# Patient Record
Sex: Male | Born: 1985 | Race: White | Hispanic: No | Smoking: Former smoker
Health system: Southern US, Community
[De-identification: ages and names within clinical notes are randomized; demographics above are authoritative.]

## PROBLEM LIST (undated history)

## (undated) DIAGNOSIS — I10 Essential (primary) hypertension: Secondary | ICD-10-CM

## (undated) DIAGNOSIS — E611 Iron deficiency: Secondary | ICD-10-CM

## (undated) DIAGNOSIS — S069XAA Unspecified intracranial injury with loss of consciousness status unknown, initial encounter: Secondary | ICD-10-CM

## (undated) DIAGNOSIS — I82409 Acute embolism and thrombosis of unspecified deep veins of unspecified lower extremity: Secondary | ICD-10-CM

## (undated) DIAGNOSIS — A0472 Enterocolitis due to Clostridium difficile, not specified as recurrent: Secondary | ICD-10-CM

---

## 2022-03-10 ENCOUNTER — Emergency Department (HOSPITAL_COMMUNITY): Payer: Medicaid Other

## 2022-03-10 ENCOUNTER — Encounter (HOSPITAL_COMMUNITY): Payer: Self-pay

## 2022-03-10 ENCOUNTER — Inpatient Hospital Stay (HOSPITAL_COMMUNITY)
Admission: EM | Admit: 2022-03-10 | Discharge: 2022-05-08 | DRG: 003 | Disposition: A | Discharge status: Rehabilitation Facility | Payer: Medicaid Other | Attending: General Surgery | Admitting: General Surgery

## 2022-03-10 ENCOUNTER — Inpatient Hospital Stay (HOSPITAL_COMMUNITY): Payer: Medicaid Other

## 2022-03-10 DIAGNOSIS — I82451 Acute embolism and thrombosis of right peroneal vein: Secondary | ICD-10-CM | POA: Diagnosis not present

## 2022-03-10 DIAGNOSIS — R4189 Other symptoms and signs involving cognitive functions and awareness: Secondary | ICD-10-CM | POA: Diagnosis not present

## 2022-03-10 DIAGNOSIS — Z7189 Other specified counseling: Secondary | ICD-10-CM | POA: Diagnosis not present

## 2022-03-10 DIAGNOSIS — J690 Pneumonitis due to inhalation of food and vomit: Secondary | ICD-10-CM | POA: Diagnosis present

## 2022-03-10 DIAGNOSIS — S02113A Unspecified occipital condyle fracture, initial encounter for closed fracture: Secondary | ICD-10-CM | POA: Diagnosis present

## 2022-03-10 DIAGNOSIS — S066XAD Traumatic subarachnoid hemorrhage with loss of consciousness status unknown, subsequent encounter: Secondary | ICD-10-CM | POA: Diagnosis not present

## 2022-03-10 DIAGNOSIS — Y838 Other surgical procedures as the cause of abnormal reaction of the patient, or of later complication, without mention of misadventure at the time of the procedure: Secondary | ICD-10-CM | POA: Diagnosis not present

## 2022-03-10 DIAGNOSIS — R339 Retention of urine, unspecified: Secondary | ICD-10-CM | POA: Diagnosis present

## 2022-03-10 DIAGNOSIS — Z20822 Contact with and (suspected) exposure to covid-19: Secondary | ICD-10-CM | POA: Diagnosis present

## 2022-03-10 DIAGNOSIS — S069X4A Unspecified intracranial injury with loss of consciousness of 6 hours to 24 hours, initial encounter: Secondary | ICD-10-CM | POA: Diagnosis not present

## 2022-03-10 DIAGNOSIS — R159 Full incontinence of feces: Secondary | ICD-10-CM | POA: Diagnosis not present

## 2022-03-10 DIAGNOSIS — F17210 Nicotine dependence, cigarettes, uncomplicated: Secondary | ICD-10-CM | POA: Diagnosis present

## 2022-03-10 DIAGNOSIS — S01512A Laceration without foreign body of oral cavity, initial encounter: Secondary | ICD-10-CM | POA: Diagnosis present

## 2022-03-10 DIAGNOSIS — R4182 Altered mental status, unspecified: Secondary | ICD-10-CM | POA: Diagnosis not present

## 2022-03-10 DIAGNOSIS — L8990 Pressure ulcer of unspecified site, unspecified stage: Secondary | ICD-10-CM | POA: Insufficient documentation

## 2022-03-10 DIAGNOSIS — S00572A Other superficial bite of oral cavity, initial encounter: Secondary | ICD-10-CM | POA: Diagnosis present

## 2022-03-10 DIAGNOSIS — Z91010 Allergy to peanuts: Secondary | ICD-10-CM

## 2022-03-10 DIAGNOSIS — Z23 Encounter for immunization: Secondary | ICD-10-CM | POA: Diagnosis not present

## 2022-03-10 DIAGNOSIS — S01311A Laceration without foreign body of right ear, initial encounter: Secondary | ICD-10-CM | POA: Diagnosis present

## 2022-03-10 DIAGNOSIS — Z87820 Personal history of traumatic brain injury: Secondary | ICD-10-CM | POA: Diagnosis not present

## 2022-03-10 DIAGNOSIS — Y92410 Unspecified street and highway as the place of occurrence of the external cause: Secondary | ICD-10-CM | POA: Diagnosis not present

## 2022-03-10 DIAGNOSIS — K9421 Gastrostomy hemorrhage: Secondary | ICD-10-CM | POA: Diagnosis not present

## 2022-03-10 DIAGNOSIS — S12001A Unspecified nondisplaced fracture of first cervical vertebra, initial encounter for closed fracture: Secondary | ICD-10-CM | POA: Diagnosis present

## 2022-03-10 DIAGNOSIS — I10 Essential (primary) hypertension: Secondary | ICD-10-CM | POA: Diagnosis present

## 2022-03-10 DIAGNOSIS — R61 Generalized hyperhidrosis: Secondary | ICD-10-CM | POA: Diagnosis not present

## 2022-03-10 DIAGNOSIS — R411 Anterograde amnesia: Secondary | ICD-10-CM | POA: Diagnosis not present

## 2022-03-10 DIAGNOSIS — A0472 Enterocolitis due to Clostridium difficile, not specified as recurrent: Secondary | ICD-10-CM | POA: Diagnosis not present

## 2022-03-10 DIAGNOSIS — Z9911 Dependence on respirator [ventilator] status: Secondary | ICD-10-CM

## 2022-03-10 DIAGNOSIS — E7220 Disorder of urea cycle metabolism, unspecified: Secondary | ICD-10-CM | POA: Diagnosis not present

## 2022-03-10 DIAGNOSIS — R52 Pain, unspecified: Secondary | ICD-10-CM | POA: Diagnosis not present

## 2022-03-10 DIAGNOSIS — R4701 Aphasia: Secondary | ICD-10-CM | POA: Diagnosis not present

## 2022-03-10 DIAGNOSIS — R451 Restlessness and agitation: Secondary | ICD-10-CM | POA: Diagnosis not present

## 2022-03-10 DIAGNOSIS — J9509 Other tracheostomy complication: Secondary | ICD-10-CM | POA: Diagnosis not present

## 2022-03-10 DIAGNOSIS — S069XAA Unspecified intracranial injury with loss of consciousness status unknown, initial encounter: Secondary | ICD-10-CM | POA: Diagnosis not present

## 2022-03-10 DIAGNOSIS — L89899 Pressure ulcer of other site, unspecified stage: Secondary | ICD-10-CM | POA: Diagnosis not present

## 2022-03-10 DIAGNOSIS — R21 Rash and other nonspecific skin eruption: Secondary | ICD-10-CM | POA: Diagnosis not present

## 2022-03-10 DIAGNOSIS — E162 Hypoglycemia, unspecified: Secondary | ICD-10-CM | POA: Diagnosis not present

## 2022-03-10 DIAGNOSIS — R41840 Attention and concentration deficit: Secondary | ICD-10-CM | POA: Diagnosis not present

## 2022-03-10 DIAGNOSIS — J9601 Acute respiratory failure with hypoxia: Secondary | ICD-10-CM | POA: Diagnosis present

## 2022-03-10 DIAGNOSIS — S069X4D Unspecified intracranial injury with loss of consciousness of 6 hours to 24 hours, subsequent encounter: Secondary | ICD-10-CM | POA: Diagnosis not present

## 2022-03-10 DIAGNOSIS — S069X0S Unspecified intracranial injury without loss of consciousness, sequela: Secondary | ICD-10-CM | POA: Diagnosis not present

## 2022-03-10 DIAGNOSIS — S069X4S Unspecified intracranial injury with loss of consciousness of 6 hours to 24 hours, sequela: Secondary | ICD-10-CM | POA: Diagnosis not present

## 2022-03-10 DIAGNOSIS — S066X9A Traumatic subarachnoid hemorrhage with loss of consciousness of unspecified duration, initial encounter: Principal | ICD-10-CM | POA: Diagnosis present

## 2022-03-10 DIAGNOSIS — Z7901 Long term (current) use of anticoagulants: Secondary | ICD-10-CM | POA: Diagnosis not present

## 2022-03-10 DIAGNOSIS — Z880 Allergy status to penicillin: Secondary | ICD-10-CM | POA: Diagnosis not present

## 2022-03-10 DIAGNOSIS — R739 Hyperglycemia, unspecified: Secondary | ICD-10-CM | POA: Diagnosis not present

## 2022-03-10 DIAGNOSIS — R509 Fever, unspecified: Secondary | ICD-10-CM | POA: Diagnosis not present

## 2022-03-10 DIAGNOSIS — R40243 Glasgow coma scale score 3-8, unspecified time: Secondary | ICD-10-CM | POA: Diagnosis present

## 2022-03-10 DIAGNOSIS — S12000S Unspecified displaced fracture of first cervical vertebra, sequela: Secondary | ICD-10-CM | POA: Diagnosis not present

## 2022-03-10 DIAGNOSIS — K9423 Gastrostomy malfunction: Secondary | ICD-10-CM | POA: Diagnosis not present

## 2022-03-10 DIAGNOSIS — Z86718 Personal history of other venous thrombosis and embolism: Secondary | ICD-10-CM | POA: Diagnosis not present

## 2022-03-10 DIAGNOSIS — E440 Moderate protein-calorie malnutrition: Secondary | ICD-10-CM | POA: Diagnosis not present

## 2022-03-10 DIAGNOSIS — S069XAD Unspecified intracranial injury with loss of consciousness status unknown, subsequent encounter: Secondary | ICD-10-CM | POA: Diagnosis not present

## 2022-03-10 DIAGNOSIS — S30820A Blister (nonthermal) of lower back and pelvis, initial encounter: Secondary | ICD-10-CM | POA: Diagnosis not present

## 2022-03-10 DIAGNOSIS — I69319 Unspecified symptoms and signs involving cognitive functions following cerebral infarction: Secondary | ICD-10-CM | POA: Diagnosis not present

## 2022-03-10 DIAGNOSIS — M7989 Other specified soft tissue disorders: Secondary | ICD-10-CM | POA: Diagnosis not present

## 2022-03-10 DIAGNOSIS — I82441 Acute embolism and thrombosis of right tibial vein: Secondary | ICD-10-CM | POA: Diagnosis not present

## 2022-03-10 DIAGNOSIS — S70321A Blister (nonthermal), right thigh, initial encounter: Secondary | ICD-10-CM | POA: Diagnosis not present

## 2022-03-10 DIAGNOSIS — T17490A Other foreign object in trachea causing asphyxiation, initial encounter: Secondary | ICD-10-CM | POA: Diagnosis not present

## 2022-03-10 DIAGNOSIS — R4020 Unspecified coma: Secondary | ICD-10-CM | POA: Diagnosis present

## 2022-03-10 DIAGNOSIS — K5900 Constipation, unspecified: Secondary | ICD-10-CM | POA: Diagnosis not present

## 2022-03-10 DIAGNOSIS — G479 Sleep disorder, unspecified: Secondary | ICD-10-CM | POA: Diagnosis not present

## 2022-03-10 DIAGNOSIS — R4689 Other symptoms and signs involving appearance and behavior: Secondary | ICD-10-CM | POA: Diagnosis not present

## 2022-03-10 DIAGNOSIS — S70322A Blister (nonthermal), left thigh, initial encounter: Secondary | ICD-10-CM | POA: Diagnosis not present

## 2022-03-10 DIAGNOSIS — S069X9A Unspecified intracranial injury with loss of consciousness of unspecified duration, initial encounter: Secondary | ICD-10-CM | POA: Diagnosis present

## 2022-03-10 DIAGNOSIS — I63419 Cerebral infarction due to embolism of unspecified middle cerebral artery: Secondary | ICD-10-CM | POA: Diagnosis not present

## 2022-03-10 DIAGNOSIS — R5383 Other fatigue: Secondary | ICD-10-CM | POA: Diagnosis not present

## 2022-03-10 DIAGNOSIS — R1310 Dysphagia, unspecified: Secondary | ICD-10-CM | POA: Diagnosis not present

## 2022-03-10 DIAGNOSIS — Z931 Gastrostomy status: Secondary | ICD-10-CM | POA: Diagnosis not present

## 2022-03-10 DIAGNOSIS — R32 Unspecified urinary incontinence: Secondary | ICD-10-CM | POA: Diagnosis not present

## 2022-03-10 DIAGNOSIS — R1312 Dysphagia, oropharyngeal phase: Secondary | ICD-10-CM | POA: Diagnosis not present

## 2022-03-10 DIAGNOSIS — F068 Other specified mental disorders due to known physiological condition: Secondary | ICD-10-CM | POA: Diagnosis not present

## 2022-03-10 DIAGNOSIS — I824Z1 Acute embolism and thrombosis of unspecified deep veins of right distal lower extremity: Secondary | ICD-10-CM | POA: Diagnosis not present

## 2022-03-10 DIAGNOSIS — S01311D Laceration without foreign body of right ear, subsequent encounter: Secondary | ICD-10-CM | POA: Diagnosis not present

## 2022-03-10 DIAGNOSIS — R000 Tachycardia, unspecified: Secondary | ICD-10-CM | POA: Diagnosis not present

## 2022-03-10 DIAGNOSIS — R197 Diarrhea, unspecified: Secondary | ICD-10-CM | POA: Diagnosis not present

## 2022-03-10 DIAGNOSIS — J386 Stenosis of larynx: Secondary | ICD-10-CM | POA: Diagnosis not present

## 2022-03-10 DIAGNOSIS — D500 Iron deficiency anemia secondary to blood loss (chronic): Secondary | ICD-10-CM | POA: Diagnosis not present

## 2022-03-10 DIAGNOSIS — S062X0S Diffuse traumatic brain injury without loss of consciousness, sequela: Secondary | ICD-10-CM | POA: Diagnosis not present

## 2022-03-10 DIAGNOSIS — K219 Gastro-esophageal reflux disease without esophagitis: Secondary | ICD-10-CM | POA: Diagnosis present

## 2022-03-10 DIAGNOSIS — S062X9S Diffuse traumatic brain injury with loss of consciousness of unspecified duration, sequela: Secondary | ICD-10-CM | POA: Diagnosis not present

## 2022-03-10 DIAGNOSIS — R0902 Hypoxemia: Secondary | ICD-10-CM | POA: Diagnosis not present

## 2022-03-10 DIAGNOSIS — S069X0A Unspecified intracranial injury without loss of consciousness, initial encounter: Secondary | ICD-10-CM | POA: Diagnosis not present

## 2022-03-10 DIAGNOSIS — R251 Tremor, unspecified: Secondary | ICD-10-CM | POA: Diagnosis not present

## 2022-03-10 DIAGNOSIS — S069X3S Unspecified intracranial injury with loss of consciousness of 1 hour to 5 hours 59 minutes, sequela: Secondary | ICD-10-CM | POA: Diagnosis not present

## 2022-03-10 DIAGNOSIS — S069X9S Unspecified intracranial injury with loss of consciousness of unspecified duration, sequela: Secondary | ICD-10-CM | POA: Diagnosis not present

## 2022-03-10 DIAGNOSIS — G969 Disorder of central nervous system, unspecified: Secondary | ICD-10-CM | POA: Diagnosis present

## 2022-03-10 DIAGNOSIS — H5500 Unspecified nystagmus: Secondary | ICD-10-CM | POA: Diagnosis present

## 2022-03-10 DIAGNOSIS — Z515 Encounter for palliative care: Secondary | ICD-10-CM | POA: Diagnosis not present

## 2022-03-10 DIAGNOSIS — D509 Iron deficiency anemia, unspecified: Secondary | ICD-10-CM | POA: Diagnosis not present

## 2022-03-10 DIAGNOSIS — G249 Dystonia, unspecified: Secondary | ICD-10-CM | POA: Diagnosis not present

## 2022-03-10 DIAGNOSIS — R7989 Other specified abnormal findings of blood chemistry: Secondary | ICD-10-CM | POA: Diagnosis not present

## 2022-03-10 DIAGNOSIS — R58 Hemorrhage, not elsewhere classified: Secondary | ICD-10-CM | POA: Diagnosis not present

## 2022-03-10 DIAGNOSIS — R471 Dysarthria and anarthria: Secondary | ICD-10-CM | POA: Diagnosis not present

## 2022-03-10 DIAGNOSIS — R404 Transient alteration of awareness: Secondary | ICD-10-CM | POA: Diagnosis not present

## 2022-03-10 DIAGNOSIS — Z930 Tracheostomy status: Secondary | ICD-10-CM | POA: Diagnosis not present

## 2022-03-10 LAB — POCT I-STAT 7, (LYTES, BLD GAS, ICA,H+H)
Acid-base deficit: 2 mmol/L (ref 0.0–2.0)
Bicarbonate: 23.1 mmol/L (ref 20.0–28.0)
Calcium, Ion: 1.19 mmol/L (ref 1.15–1.40)
HCT: 42 % (ref 39.0–52.0)
Hemoglobin: 14.3 g/dL (ref 13.0–17.0)
O2 Saturation: 100 %
Patient temperature: 98.2
Potassium: 3.7 mmol/L (ref 3.5–5.1)
Sodium: 141 mmol/L (ref 135–145)
TCO2: 24 mmol/L (ref 22–32)
pCO2 arterial: 38.3 mmHg (ref 32–48)
pH, Arterial: 7.387 (ref 7.35–7.45)
pO2, Arterial: 349 mmHg — ABNORMAL HIGH (ref 83–108)

## 2022-03-10 LAB — COMPREHENSIVE METABOLIC PANEL
ALT: 43 U/L (ref 0–44)
AST: 42 U/L — ABNORMAL HIGH (ref 15–41)
Albumin: 4.8 g/dL (ref 3.5–5.0)
Alkaline Phosphatase: 67 U/L (ref 38–126)
Anion gap: 13 (ref 5–15)
BUN: 9 mg/dL (ref 6–20)
CO2: 21 mmol/L — ABNORMAL LOW (ref 22–32)
Calcium: 9 mg/dL (ref 8.9–10.3)
Chloride: 107 mmol/L (ref 98–111)
Creatinine, Ser: 0.89 mg/dL (ref 0.61–1.24)
GFR, Estimated: >60 mL/min (ref >60)
Glucose, Bld: 124 mg/dL — ABNORMAL HIGH (ref 70–99)
Potassium: 3.5 mmol/L (ref 3.5–5.1)
Sodium: 141 mmol/L (ref 135–145)
Total Bilirubin: 0.6 mg/dL (ref 0.3–1.2)
Total Protein: 7.8 g/dL (ref 6.5–8.1)

## 2022-03-10 LAB — URINALYSIS, ROUTINE W REFLEX MICROSCOPIC
Bilirubin Urine: NEGATIVE
Glucose, UA: NEGATIVE mg/dL
Ketones, ur: NEGATIVE mg/dL
Leukocytes,Ua: NEGATIVE
Nitrite: NEGATIVE
Protein, ur: 30 mg/dL — AB
Specific Gravity, Urine: 1.018 (ref 1.005–1.030)
pH: 5 (ref 5.0–8.0)

## 2022-03-10 LAB — POCT I-STAT EG7
Acid-base deficit: 3 mmol/L — ABNORMAL HIGH (ref 0.0–2.0)
Bicarbonate: 23.2 mmol/L (ref 20.0–28.0)
Calcium, Ion: 1.19 mmol/L (ref 1.15–1.40)
HCT: 42 % (ref 39.0–52.0)
Hemoglobin: 14.3 g/dL (ref 13.0–17.0)
O2 Saturation: 71 %
Patient temperature: 98.2
Potassium: 3.7 mmol/L (ref 3.5–5.1)
Sodium: 142 mmol/L (ref 135–145)
TCO2: 24 mmol/L (ref 22–32)
pCO2, Ven: 42.7 mmHg — ABNORMAL LOW (ref 44–60)
pH, Ven: 7.342 (ref 7.25–7.43)
pO2, Ven: 39 mmHg (ref 32–45)

## 2022-03-10 LAB — CBC
HCT: 47.2 % (ref 39.0–52.0)
Hemoglobin: 16.2 g/dL (ref 13.0–17.0)
MCH: 31.6 pg (ref 26.0–34.0)
MCHC: 34.3 g/dL (ref 30.0–36.0)
MCV: 92.2 fL (ref 80.0–100.0)
Platelets: 240 10*3/uL (ref 150–400)
RBC: 5.12 MIL/uL (ref 4.22–5.81)
RDW: 12.2 % (ref 11.5–15.5)
WBC: 17.4 10*3/uL — ABNORMAL HIGH (ref 4.0–10.5)
nRBC: 0 % (ref 0.0–0.2)

## 2022-03-10 LAB — ETHANOL: Alcohol, Ethyl (B): 96 mg/dL — ABNORMAL HIGH (ref <10)

## 2022-03-10 LAB — SAMPLE TO BLOOD BANK

## 2022-03-10 LAB — RESP PANEL BY RT-PCR (FLU A&B, COVID) ARPGX2
Influenza A by PCR: NEGATIVE
Influenza B by PCR: NEGATIVE
SARS Coronavirus 2 by RT PCR: NEGATIVE

## 2022-03-10 LAB — LACTIC ACID, PLASMA: Lactic Acid, Venous: 4 mmol/L (ref 0.5–1.9)

## 2022-03-10 LAB — PROTIME-INR
INR: 1.1 (ref 0.8–1.2)
Prothrombin Time: 13.8 seconds (ref 11.4–15.2)

## 2022-03-10 MED ORDER — MIDAZOLAM HCL 2 MG/2ML IJ SOLN
INTRAMUSCULAR | Status: AC
  Filled 2022-03-10: qty 2

## 2022-03-10 MED ORDER — DOCUSATE SODIUM 50 MG/5ML PO LIQD
100.0000 mg | Freq: Two times a day (BID) | ORAL | Status: DC
  Administered 2022-03-11 – 2022-05-08 (×87): 100 mg
  Filled 2022-03-10 (×98): qty 10

## 2022-03-10 MED ORDER — CEFAZOLIN SODIUM-DEXTROSE 2-4 GM/100ML-% IV SOLN
2.0000 g | Freq: Once | INTRAVENOUS | Status: AC
  Administered 2022-03-10: 2 g via INTRAVENOUS

## 2022-03-10 MED ORDER — FENTANYL CITRATE PF 50 MCG/ML IJ SOSY: 100.0000 ug | PREFILLED_SYRINGE | Freq: Once | INTRAMUSCULAR | Status: AC

## 2022-03-10 MED ORDER — LACTATED RINGERS IV SOLN: INTRAVENOUS | Status: DC

## 2022-03-10 MED ORDER — IOHEXOL 350 MG/ML SOLN
100.0000 mL | Freq: Once | INTRAVENOUS | Status: AC | PRN
  Administered 2022-03-10: 100 mL via INTRAVENOUS

## 2022-03-10 MED ORDER — ENOXAPARIN SODIUM 30 MG/0.3ML IJ SOSY: 30.0000 mg | PREFILLED_SYRINGE | Freq: Two times a day (BID) | INTRAMUSCULAR | Status: DC

## 2022-03-10 MED ORDER — CHLORHEXIDINE GLUCONATE CLOTH 2 % EX PADS
6.0000 | MEDICATED_PAD | Freq: Every day | CUTANEOUS | Status: DC
  Administered 2022-03-10 – 2022-05-03 (×52): 6 via TOPICAL

## 2022-03-10 MED ORDER — ORAL CARE MOUTH RINSE
15.0000 mL | OROMUCOSAL | Status: DC
  Administered 2022-03-11 – 2022-05-08 (×488): 15 mL via OROMUCOSAL

## 2022-03-10 MED ORDER — PROPOFOL 1000 MG/100ML IV EMUL: 5.0000 ug/kg/min | INTRAVENOUS | Status: DC

## 2022-03-10 MED ORDER — LEVETIRACETAM IN NACL 500 MG/100ML IV SOLN
500.0000 mg | Freq: Two times a day (BID) | INTRAVENOUS | Status: DC
  Administered 2022-03-10 – 2022-03-12 (×5): 500 mg via INTRAVENOUS
  Filled 2022-03-10 (×5): qty 100

## 2022-03-10 MED ORDER — CHLORHEXIDINE GLUCONATE 0.12% ORAL RINSE (MEDLINE KIT)
15.0000 mL | Freq: Two times a day (BID) | OROMUCOSAL | Status: DC
  Administered 2022-03-11 – 2022-05-08 (×114): 15 mL via OROMUCOSAL

## 2022-03-10 MED ORDER — SODIUM CHLORIDE 0.9 % IV SOLN: INTRAVENOUS | Status: DC

## 2022-03-10 MED ORDER — POLYETHYLENE GLYCOL 3350 17 G PO PACK
17.0000 g | PACK | Freq: Every day | ORAL | Status: DC
  Administered 2022-03-12 – 2022-03-14 (×3): 17 g
  Filled 2022-03-10 (×3): qty 1

## 2022-03-10 MED ORDER — FENTANYL BOLUS VIA INFUSION
50.0000 ug | INTRAVENOUS | Status: DC | PRN
  Filled 2022-03-10: qty 50

## 2022-03-10 MED ORDER — MORPHINE SULFATE (PF) 4 MG/ML IV SOLN
4.0000 mg | INTRAVENOUS | Status: DC | PRN
  Administered 2022-03-18 – 2022-03-30 (×9): 4 mg via INTRAVENOUS
  Filled 2022-03-10 (×9): qty 1

## 2022-03-10 MED ORDER — TETANUS-DIPHTH-ACELL PERTUSSIS 5-2.5-18.5 LF-MCG/0.5 IM SUSY
0.5000 mL | PREFILLED_SYRINGE | Freq: Once | INTRAMUSCULAR | Status: AC
  Administered 2022-03-10: 0.5 mL via INTRAMUSCULAR

## 2022-03-10 MED ORDER — ACETAMINOPHEN 500 MG PO TABS
1000.0000 mg | ORAL_TABLET | Freq: Four times a day (QID) | ORAL | Status: DC
  Administered 2022-03-11 – 2022-03-23 (×47): 1000 mg
  Filled 2022-03-10 (×49): qty 2

## 2022-03-10 MED ORDER — ONDANSETRON HCL 4 MG/2ML IJ SOLN: 4.0000 mg | Freq: Four times a day (QID) | INTRAMUSCULAR | Status: DC | PRN

## 2022-03-10 MED ORDER — SODIUM CHLORIDE 0.9 % IV BOLUS
1000.0000 mL | Freq: Once | INTRAVENOUS | Status: AC
  Administered 2022-03-10: 1000 mL via INTRAVENOUS

## 2022-03-10 MED ORDER — OXYCODONE HCL 5 MG PO TABS: 5.0000 mg | ORAL_TABLET | ORAL | Status: DC | PRN

## 2022-03-10 MED ORDER — PROPOFOL 1000 MG/100ML IV EMUL
5.0000 ug/kg/min | INTRAVENOUS | Status: DC
Start: 2022-03-10 — End: 2022-03-10
  Administered 2022-03-10: 36 ug/kg/min via INTRAVENOUS

## 2022-03-10 MED ORDER — FENTANYL 2500MCG IN NS 250ML (10MCG/ML) PREMIX INFUSION
50.0000 ug/h | INTRAVENOUS | Status: DC
  Administered 2022-03-10: 200 ug/h via INTRAVENOUS
  Administered 2022-03-11: 100 ug/h via INTRAVENOUS
  Administered 2022-03-13: 75 ug/h via INTRAVENOUS
  Administered 2022-03-14 – 2022-03-15 (×2): 100 ug/h via INTRAVENOUS
  Administered 2022-03-16: 200 ug/h via INTRAVENOUS
  Administered 2022-03-16 – 2022-03-17 (×3): 150 ug/h via INTRAVENOUS
  Filled 2022-03-10 (×8): qty 250

## 2022-03-10 MED ORDER — DOCUSATE SODIUM 100 MG PO CAPS: 100.0000 mg | ORAL_CAPSULE | Freq: Two times a day (BID) | ORAL | Status: DC

## 2022-03-10 MED ORDER — FENTANYL CITRATE (PF) 100 MCG/2ML IJ SOLN
INTRAMUSCULAR | Status: AC
  Filled 2022-03-10: qty 2

## 2022-03-10 MED ORDER — SODIUM CHLORIDE 0.9 % IV SOLN
INTRAVENOUS | Status: AC | PRN
  Administered 2022-03-10: 1000 mL via INTRAVENOUS

## 2022-03-10 MED ORDER — FENTANYL CITRATE PF 50 MCG/ML IJ SOSY: 50.0000 ug | PREFILLED_SYRINGE | Freq: Once | INTRAMUSCULAR | Status: DC

## 2022-03-10 MED ORDER — FENTANYL CITRATE (PF) 100 MCG/2ML IJ SOLN
INTRAMUSCULAR | Status: AC
  Administered 2022-03-10: 100 ug
  Filled 2022-03-10: qty 2

## 2022-03-10 MED ORDER — ACETAMINOPHEN 500 MG PO TABS: 1000.0000 mg | ORAL_TABLET | Freq: Four times a day (QID) | ORAL | Status: DC

## 2022-03-10 MED ORDER — FENTANYL BOLUS VIA INFUSION
50.0000 ug | INTRAVENOUS | Status: DC | PRN
  Administered 2022-03-13 – 2022-03-15 (×13): 50 ug via INTRAVENOUS
  Administered 2022-03-16 (×2): 100 ug via INTRAVENOUS
  Administered 2022-03-17 – 2022-03-18 (×2): 50 ug via INTRAVENOUS
  Filled 2022-03-10: qty 100

## 2022-03-10 MED ORDER — FENTANYL 2500MCG IN NS 250ML (10MCG/ML) PREMIX INFUSION
25.0000 ug/h | INTRAVENOUS | Status: DC
  Administered 2022-03-10: 100 ug/h via INTRAVENOUS
  Filled 2022-03-10: qty 250

## 2022-03-10 MED ORDER — OXYCODONE HCL 5 MG PO TABS
5.0000 mg | ORAL_TABLET | ORAL | Status: DC | PRN
  Administered 2022-03-11: 5 mg
  Administered 2022-03-11: 10 mg
  Administered 2022-03-11: 5 mg
  Administered 2022-03-13 – 2022-03-29 (×34): 10 mg
  Administered 2022-03-29: 5 mg
  Administered 2022-03-30: 10 mg
  Administered 2022-03-30: 5 mg
  Administered 2022-04-05: 10 mg
  Filled 2022-03-10 (×5): qty 2
  Filled 2022-03-10: qty 1
  Filled 2022-03-10 (×7): qty 2
  Filled 2022-03-10: qty 1
  Filled 2022-03-10 (×29): qty 2

## 2022-03-10 MED ORDER — ONDANSETRON 4 MG PO TBDP: 4.0000 mg | ORAL_TABLET | Freq: Four times a day (QID) | ORAL | Status: DC | PRN

## 2022-03-10 MED ORDER — PROPOFOL 1000 MG/100ML IV EMUL
INTRAVENOUS | Status: AC
  Filled 2022-03-10: qty 100

## 2022-03-10 MED ORDER — PROPOFOL 1000 MG/100ML IV EMUL
5.0000 ug/kg/min | INTRAVENOUS | Status: DC
  Administered 2022-03-10: 30 ug/kg/min via INTRAVENOUS
  Administered 2022-03-12: 5 ug/kg/min via INTRAVENOUS
  Filled 2022-03-10 (×2): qty 100
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
## 2022-03-10 NOTE — Progress Notes (Signed)
Pt transported from ED to [id number] w/o complications. RT will cont to monitor.  ?

## 2022-03-10 NOTE — ED Notes (Signed)
Report given.

## 2022-03-10 NOTE — ED Notes (Signed)
Patient to CT with Trauma RN, RN and Trauma MD. ?
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
## 2022-03-11 LAB — CBC
HCT: 41.3 % (ref 39.0–52.0)
Hemoglobin: 14.3 g/dL (ref 13.0–17.0)
MCH: 31 pg (ref 26.0–34.0)
MCHC: 34.6 g/dL (ref 30.0–36.0)
MCV: 89.6 fL (ref 80.0–100.0)
Platelets: 166 10*3/uL (ref 150–400)
RBC: 4.61 MIL/uL (ref 4.22–5.81)
RDW: 12.5 % (ref 11.5–15.5)
WBC: 16.6 10*3/uL — ABNORMAL HIGH (ref 4.0–10.5)
nRBC: 0 % (ref 0.0–0.2)

## 2022-03-11 LAB — POCT I-STAT, CHEM 8
BUN: 9 mg/dL (ref 6–20)
Calcium, Ion: 1.03 mmol/L — ABNORMAL LOW (ref 1.15–1.40)
Chloride: 108 mmol/L (ref 98–111)
Creatinine, Ser: 1 mg/dL (ref 0.61–1.24)
Glucose, Bld: 122 mg/dL — ABNORMAL HIGH (ref 70–99)
HCT: 49 % (ref 39.0–52.0)
Hemoglobin: 16.7 g/dL (ref 13.0–17.0)
Potassium: 3.5 mmol/L (ref 3.5–5.1)
Sodium: 142 mmol/L (ref 135–145)
TCO2: 23 mmol/L (ref 22–32)

## 2022-03-11 LAB — GLUCOSE, CAPILLARY
Glucose-Capillary: 115 mg/dL — ABNORMAL HIGH (ref 70–99)
Glucose-Capillary: 114 mg/dL — ABNORMAL HIGH (ref 70–99)
Glucose-Capillary: 123 mg/dL — ABNORMAL HIGH (ref 70–99)

## 2022-03-11 LAB — MAGNESIUM: Magnesium: 1.9 mg/dL (ref 1.7–2.4)

## 2022-03-11 LAB — BASIC METABOLIC PANEL
Anion gap: 9 (ref 5–15)
BUN: 8 mg/dL (ref 6–20)
CO2: 23 mmol/L (ref 22–32)
Calcium: 9 mg/dL (ref 8.9–10.3)
Chloride: 108 mmol/L (ref 98–111)
Creatinine, Ser: 0.92 mg/dL (ref 0.61–1.24)
GFR, Estimated: >60 mL/min (ref >60)
Glucose, Bld: 101 mg/dL — ABNORMAL HIGH (ref 70–99)
Potassium: 3.9 mmol/L (ref 3.5–5.1)
Sodium: 140 mmol/L (ref 135–145)

## 2022-03-11 LAB — TRIGLYCERIDES: Triglycerides: 440 mg/dL — ABNORMAL HIGH (ref <150)

## 2022-03-11 LAB — PHOSPHORUS: Phosphorus: 3.6 mg/dL (ref 2.5–4.6)

## 2022-03-11 LAB — HIV ANTIBODY (ROUTINE TESTING W REFLEX): HIV Screen 4th Generation wRfx: NONREACTIVE

## 2022-03-11 LAB — MRSA NEXT GEN BY PCR, NASAL: MRSA by PCR Next Gen: NOT DETECTED

## 2022-03-11 MED ORDER — VITAL HIGH PROTEIN PO LIQD: 1000.0000 mL | ORAL | Status: DC

## 2022-03-11 MED ORDER — CLINDAMYCIN PALMITATE HCL 75 MG/5ML PO SOLR
150.0000 mg | Freq: Three times a day (TID) | ORAL | Status: AC
Start: 2022-03-11 — End: 2022-03-15
  Administered 2022-03-11 – 2022-03-15 (×15): 150 mg
  Filled 2022-03-11 (×15): qty 10

## 2022-03-11 MED ORDER — PIVOT 1.5 CAL PO LIQD
1000.0000 mL | ORAL | Status: DC
  Administered 2022-03-11 – 2022-04-21 (×46): 1000 mL
  Filled 2022-03-11 (×48): qty 1000

## 2022-03-11 MED ORDER — PROSOURCE TF PO LIQD: 45.0000 mL | Freq: Two times a day (BID) | ORAL | Status: DC
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
## 2022-03-11 NOTE — Progress Notes (Signed)
PT Cancellation Note ? ?Patient Details ?Name: Carl Carpenter ?MRN: [id number] ?DOB: Jul 08, 1986 ? ? ?Cancelled Treatment:    Reason Eval/Treat Not Completed: Medical issues which prohibited therapy. Pt is sedated/intubated. Will follow up later time.  ? ? ?Arlyss Gandy ?03/11/2022, 10:03 AM ?

## 2022-03-11 NOTE — Progress Notes (Signed)
?  Transition of Care (TOC) Screening Note ? ? ?Patient Details  ?Name: Carl Carpenter ?Date of Birth: 02-Dec-1986 ? ? ?Transition of Care [facility]) CM/SW Contact:    ?Glennon Mac, RN ?Phone Number: ?03/11/2022, 10:37 AM ? ? ? ?Transition of Care Department [facility]) has reviewed patient and no TOC needs have been identified at this time. We will continue to monitor patient advancement through interdisciplinary progression rounds. If new patient transition needs arise, please place a TOC consult. ? ?Quintella Baton, RN, BSN  ?Trauma/Neuro ICU Case Manager ?[phone] ? ?
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
## 2022-03-11 NOTE — Progress Notes (Signed)
OT Cancellation Note ? ?Patient Details ?Name: Carl Carpenter ?MRN: [id number] ?DOB: [birth year] ? ? ?Cancelled Treatment:    Reason Eval/Treat Not Completed: Other (comment) Pt is sedated/intubated. Will follow up later time.  ? ?Sharronda Schweers,HILLARY ?03/11/2022, 8:32 AM ?Luisa Dago, OT/L  ? ?Acute OT Clinical Specialist ?Acute Rehabilitation Services ?Pager [phone] ?Office [phone]  ?
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
## 2022-03-12 ENCOUNTER — Inpatient Hospital Stay (HOSPITAL_COMMUNITY): Payer: Medicaid Other

## 2022-03-12 LAB — GLUCOSE, CAPILLARY
Glucose-Capillary: 150 mg/dL — ABNORMAL HIGH (ref 70–99)
Glucose-Capillary: 119 mg/dL — ABNORMAL HIGH (ref 70–99)
Glucose-Capillary: 126 mg/dL — ABNORMAL HIGH (ref 70–99)
Glucose-Capillary: 145 mg/dL — ABNORMAL HIGH (ref 70–99)
Glucose-Capillary: 120 mg/dL — ABNORMAL HIGH (ref 70–99)
Glucose-Capillary: 154 mg/dL — ABNORMAL HIGH (ref 70–99)

## 2022-03-12 LAB — CBC
HCT: 38.1 % — ABNORMAL LOW (ref 39.0–52.0)
Hemoglobin: 13.4 g/dL (ref 13.0–17.0)
MCH: 31.9 pg (ref 26.0–34.0)
MCHC: 35.2 g/dL (ref 30.0–36.0)
MCV: 90.7 fL (ref 80.0–100.0)
Platelets: 152 10*3/uL (ref 150–400)
RBC: 4.2 MIL/uL — ABNORMAL LOW (ref 4.22–5.81)
RDW: 12.4 % (ref 11.5–15.5)
WBC: 14.4 10*3/uL — ABNORMAL HIGH (ref 4.0–10.5)
nRBC: 0 % (ref 0.0–0.2)

## 2022-03-12 LAB — BASIC METABOLIC PANEL
Anion gap: 4 — ABNORMAL LOW (ref 5–15)
BUN: 12 mg/dL (ref 6–20)
CO2: 26 mmol/L (ref 22–32)
Calcium: 8.8 mg/dL — ABNORMAL LOW (ref 8.9–10.3)
Chloride: 106 mmol/L (ref 98–111)
Creatinine, Ser: 0.77 mg/dL (ref 0.61–1.24)
GFR, Estimated: >60 mL/min (ref >60)
Glucose, Bld: 121 mg/dL — ABNORMAL HIGH (ref 70–99)
Potassium: 3.7 mmol/L (ref 3.5–5.1)
Sodium: 136 mmol/L (ref 135–145)

## 2022-03-12 LAB — MAGNESIUM
Magnesium: 2.3 mg/dL (ref 1.7–2.4)
Magnesium: 2.1 mg/dL (ref 1.7–2.4)

## 2022-03-12 LAB — PHOSPHORUS
Phosphorus: 2.8 mg/dL (ref 2.5–4.6)
Phosphorus: 2.4 mg/dL — ABNORMAL LOW (ref 2.5–4.6)

## 2022-03-12 MED ORDER — DEXMEDETOMIDINE HCL IN NACL 400 MCG/100ML IV SOLN
0.4000 ug/kg/h | INTRAVENOUS | Status: DC
  Administered 2022-03-12 – 2022-03-13 (×4): 0.4 ug/kg/h via INTRAVENOUS
  Administered 2022-03-13 – 2022-03-14 (×3): 0.7 ug/kg/h via INTRAVENOUS
  Administered 2022-03-14: 0.6 ug/kg/h via INTRAVENOUS
  Administered 2022-03-14: 0.8 ug/kg/h via INTRAVENOUS
  Administered 2022-03-14: 0.7 ug/kg/h via INTRAVENOUS
  Administered 2022-03-14: 0.6 ug/kg/h via INTRAVENOUS
  Administered 2022-03-15: 0.7 ug/kg/h via INTRAVENOUS
  Administered 2022-03-15: 0.6 ug/kg/h via INTRAVENOUS
  Administered 2022-03-15: 0.8 ug/kg/h via INTRAVENOUS
  Administered 2022-03-15: 0.599 ug/kg/h via INTRAVENOUS
  Administered 2022-03-15: 0.6 ug/kg/h via INTRAVENOUS
  Administered 2022-03-16 (×2): 0.8 ug/kg/h via INTRAVENOUS
  Administered 2022-03-16 (×2): 0.6 ug/kg/h via INTRAVENOUS
  Administered 2022-03-17: 0.5 ug/kg/h via INTRAVENOUS
  Administered 2022-03-17: 0.7 ug/kg/h via INTRAVENOUS
  Administered 2022-03-17: 0.6 ug/kg/h via INTRAVENOUS
  Administered 2022-03-17: 0.5 ug/kg/h via INTRAVENOUS
  Administered 2022-03-17: 0.8 ug/kg/h via INTRAVENOUS
  Administered 2022-03-18: 0.5 ug/kg/h via INTRAVENOUS
  Administered 2022-03-18 (×2): 0.7 ug/kg/h via INTRAVENOUS
  Administered 2022-03-18 (×2): 0.599 ug/kg/h via INTRAVENOUS
  Administered 2022-03-19 (×2): 1 ug/kg/h via INTRAVENOUS
  Administered 2022-03-19: 0.7 ug/kg/h via INTRAVENOUS
  Administered 2022-03-19: 1 ug/kg/h via INTRAVENOUS
  Administered 2022-03-19 (×2): 1.2 ug/kg/h via INTRAVENOUS
  Administered 2022-03-20: 1 ug/kg/h via INTRAVENOUS
  Administered 2022-03-20 (×2): 0.9 ug/kg/h via INTRAVENOUS
  Administered 2022-03-20: 1 ug/kg/h via INTRAVENOUS
  Administered 2022-03-20 (×2): 0.9 ug/kg/h via INTRAVENOUS
  Administered 2022-03-21 – 2022-03-22 (×8): 1 ug/kg/h via INTRAVENOUS
  Administered 2022-03-22: 1.1 ug/kg/h via INTRAVENOUS
  Administered 2022-03-22: 1.2 ug/kg/h via INTRAVENOUS
  Administered 2022-03-22: 1.1 ug/kg/h via INTRAVENOUS
  Administered 2022-03-22: 1 ug/kg/h via INTRAVENOUS
  Administered 2022-03-22: 1.1 ug/kg/h via INTRAVENOUS
  Administered 2022-03-22: 1 ug/kg/h via INTRAVENOUS
  Administered 2022-03-23 – 2022-03-24 (×10): 1.1 ug/kg/h via INTRAVENOUS
  Administered 2022-03-24: 1 ug/kg/h via INTRAVENOUS
  Administered 2022-03-24 (×3): 0.8 ug/kg/h via INTRAVENOUS
  Administered 2022-03-25: 0.5 ug/kg/h via INTRAVENOUS
  Administered 2022-03-25: 1 ug/kg/h via INTRAVENOUS
  Administered 2022-03-26 (×2): 0.4 ug/kg/h via INTRAVENOUS
  Administered 2022-03-26 – 2022-03-27 (×2): 0.6 ug/kg/h via INTRAVENOUS
  Administered 2022-03-27: 0.5 ug/kg/h via INTRAVENOUS
  Administered 2022-03-27: 0.7 ug/kg/h via INTRAVENOUS
  Administered 2022-03-27 – 2022-03-28 (×2): 0.5 ug/kg/h via INTRAVENOUS
  Administered 2022-03-28: 0.6 ug/kg/h via INTRAVENOUS
  Filled 2022-03-12: qty 400
  Filled 2022-03-12 (×11): qty 100
  Filled 2022-03-12: qty 200
  Filled 2022-03-12 (×22): qty 100
  Filled 2022-03-12: qty 200
  Filled 2022-03-12 (×11): qty 100
  Filled 2022-03-12: qty 200
  Filled 2022-03-12 (×33): qty 100

## 2022-03-12 MED ORDER — PANTOPRAZOLE 2 MG/ML SUSPENSION
40.0000 mg | Freq: Every day | ORAL | Status: DC
  Administered 2022-03-12 – 2022-03-29 (×17): 40 mg
  Filled 2022-03-12 (×17): qty 20

## 2022-03-12 MED ORDER — POTASSIUM CHLORIDE 20 MEQ PO PACK
40.0000 meq | PACK | Freq: Once | ORAL | Status: AC
  Administered 2022-03-12: 40 meq
  Filled 2022-03-12: qty 2
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
## 2022-03-12 NOTE — Progress Notes (Signed)
   Inpatient Rehab Admissions Coordinator :  Per therapy recommendations patient was screened for CIR candidacy by Macaela Presas RN MSN. Patient is not yet at a level to tolerate the intensity required to pursue a CIR admit . Patient may have the potential to progress to become a candidate. The CIR admissions team will follow and monitor for progress and place a Rehab Consult order if felt to be appropriate. Please contact me with any questions.  Brenleigh Collet RN MSN Admissions Coordinator [phone]  

## 2022-03-12 NOTE — Progress Notes (Signed)
Pt placed back on full ventilator support for night rest. 
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
## 2022-03-12 NOTE — Progress Notes (Signed)
Pt making sucking sounds and having possible lip twitching, some motions that appears almost like hiccups but the vent does not reflect hiccups unsure if patient is having a suckling reflex or possible seizures. Dr. Fredricka Bonine aware, start propofol at a low dose and get a head CT.  ? ?Per Dr. Fredricka Bonine the head CT was unchanged- keep propofol on at a rate in which the patient appears comfortable. Will continue to monitor.  ?
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
## 2022-03-12 NOTE — Progress Notes (Signed)
No issues or problems overnight.  Patient remains intubated and sedated although sedation was just recently turned off. ? ?No awakening to noxious stimuli.  Pupils remain pinpoint.  Is beginning to move spontaneously with some weak semipurposeful movements in both upper extremities. ? ?Follow-up head CT scan demonstrates stable appearance of his perimesencephalic subarachnoid hemorrhage.  No evidence of any significant cerebral edema or evidence of intracranial hypertension. ? ?Status post severe traumatic brain injury.  Continue supportive efforts.  Wean sedation and fully assess neurologic function as he is able to tolerate. ?

## 2022-03-12 NOTE — Progress Notes (Signed)
Pt transported from 4N16 to CT w/o complications. RT will cont to monitor.  ?

## 2022-03-13 ENCOUNTER — Encounter (HOSPITAL_COMMUNITY): Payer: Self-pay

## 2022-03-13 ENCOUNTER — Other Ambulatory Visit: Payer: Self-pay

## 2022-03-13 LAB — CBC
HCT: 35.4 % — ABNORMAL LOW (ref 39.0–52.0)
Hemoglobin: 11.9 g/dL — ABNORMAL LOW (ref 13.0–17.0)
MCH: 30.9 pg (ref 26.0–34.0)
MCHC: 33.6 g/dL (ref 30.0–36.0)
MCV: 91.9 fL (ref 80.0–100.0)
Platelets: 125 10*3/uL — ABNORMAL LOW (ref 150–400)
RBC: 3.85 MIL/uL — ABNORMAL LOW (ref 4.22–5.81)
RDW: 12.4 % (ref 11.5–15.5)
WBC: 12.5 10*3/uL — ABNORMAL HIGH (ref 4.0–10.5)
nRBC: 0 % (ref 0.0–0.2)

## 2022-03-13 LAB — BASIC METABOLIC PANEL
Anion gap: 7 (ref 5–15)
BUN: 17 mg/dL (ref 6–20)
CO2: 23 mmol/L (ref 22–32)
Calcium: 8.7 mg/dL — ABNORMAL LOW (ref 8.9–10.3)
Chloride: 107 mmol/L (ref 98–111)
Creatinine, Ser: 0.86 mg/dL (ref 0.61–1.24)
GFR, Estimated: >60 mL/min (ref >60)
Glucose, Bld: 149 mg/dL — ABNORMAL HIGH (ref 70–99)
Potassium: 3.9 mmol/L (ref 3.5–5.1)
Sodium: 137 mmol/L (ref 135–145)

## 2022-03-13 LAB — GLUCOSE, CAPILLARY
Glucose-Capillary: 138 mg/dL — ABNORMAL HIGH (ref 70–99)
Glucose-Capillary: 139 mg/dL — ABNORMAL HIGH (ref 70–99)
Glucose-Capillary: 127 mg/dL — ABNORMAL HIGH (ref 70–99)
Glucose-Capillary: 139 mg/dL — ABNORMAL HIGH (ref 70–99)
Glucose-Capillary: 126 mg/dL — ABNORMAL HIGH (ref 70–99)
Glucose-Capillary: 113 mg/dL — ABNORMAL HIGH (ref 70–99)

## 2022-03-13 LAB — PHOSPHORUS: Phosphorus: 3 mg/dL (ref 2.5–4.6)

## 2022-03-13 LAB — MAGNESIUM: Magnesium: 2 mg/dL (ref 1.7–2.4)

## 2022-03-13 MED ORDER — SODIUM CHLORIDE 0.9 % IV BOLUS
1000.0000 mL | Freq: Once | INTRAVENOUS | Status: AC
  Administered 2022-03-13: 1000 mL via INTRAVENOUS

## 2022-03-13 MED ORDER — LEVETIRACETAM 100 MG/ML PO SOLN
500.0000 mg | Freq: Two times a day (BID) | ORAL | Status: AC
  Administered 2022-03-13 – 2022-03-19 (×14): 500 mg
  Filled 2022-03-13 (×14): qty 5

## 2022-03-13 MED ORDER — GUAIFENESIN 100 MG/5ML PO LIQD
15.0000 mL | Freq: Four times a day (QID) | ORAL | Status: DC
  Administered 2022-03-13 – 2022-04-15 (×125): 15 mL
  Filled 2022-03-13 (×3): qty 15
  Filled 2022-03-13: qty 20
  Filled 2022-03-13: qty 15
  Filled 2022-03-13 (×3): qty 20
  Filled 2022-03-13: qty 15
  Filled 2022-03-13 (×2): qty 20
  Filled 2022-03-13: qty 15
  Filled 2022-03-13: qty 20
  Filled 2022-03-13: qty 15
  Filled 2022-03-13 (×2): qty 20
  Filled 2022-03-13 (×3): qty 15
  Filled 2022-03-13 (×2): qty 20
  Filled 2022-03-13: qty 15
  Filled 2022-03-13: qty 20
  Filled 2022-03-13 (×2): qty 15
  Filled 2022-03-13: qty 20
  Filled 2022-03-13 (×4): qty 15
  Filled 2022-03-13: qty 20
  Filled 2022-03-13: qty 15
  Filled 2022-03-13 (×3): qty 20
  Filled 2022-03-13 (×4): qty 15
  Filled 2022-03-13: qty 20
  Filled 2022-03-13 (×3): qty 15
  Filled 2022-03-13: qty 20
  Filled 2022-03-13: qty 15
  Filled 2022-03-13 (×5): qty 20
  Filled 2022-03-13 (×2): qty 15
  Filled 2022-03-13 (×3): qty 20
  Filled 2022-03-13: qty 15
  Filled 2022-03-13: qty 20
  Filled 2022-03-13 (×2): qty 15
  Filled 2022-03-13 (×5): qty 20
  Filled 2022-03-13: qty 15
  Filled 2022-03-13 (×2): qty 20
  Filled 2022-03-13 (×2): qty 15
  Filled 2022-03-13: qty 20
  Filled 2022-03-13: qty 15
  Filled 2022-03-13 (×5): qty 20
  Filled 2022-03-13 (×2): qty 15
  Filled 2022-03-13: qty 20
  Filled 2022-03-13 (×6): qty 15
  Filled 2022-03-13: qty 20
  Filled 2022-03-13: qty 15
  Filled 2022-03-13: qty 20
  Filled 2022-03-13: qty 15
  Filled 2022-03-13 (×3): qty 20
  Filled 2022-03-13: qty 15
  Filled 2022-03-13: qty 20
  Filled 2022-03-13 (×2): qty 15
  Filled 2022-03-13: qty 20
  Filled 2022-03-13: qty 15
  Filled 2022-03-13: qty 20
  Filled 2022-03-13: qty 15
  Filled 2022-03-13: qty 20
  Filled 2022-03-13: qty 15
  Filled 2022-03-13 (×3): qty 20
  Filled 2022-03-13: qty 15
  Filled 2022-03-13: qty 20
  Filled 2022-03-13: qty 15
  Filled 2022-03-13 (×2): qty 20
  Filled 2022-03-13 (×2): qty 15
  Filled 2022-03-13 (×2): qty 20
  Filled 2022-03-13 (×3): qty 15
  Filled 2022-03-13: qty 20
  Filled 2022-03-13 (×2): qty 15
  Filled 2022-03-13: qty 20
  Filled 2022-03-13: qty 15
  Filled 2022-03-13 (×2): qty 20

## 2022-03-13 NOTE — Progress Notes (Addendum)
?   03/13/22 1300  ?Provider Notification  ?Provider Name/Title B. Janee Morn MD  ?Date Provider Notified 03/13/22  ?Time Provider Notified 1304  ?Notification Type Page  ?Notification Reason Other (Comment) ?(development of thick secretions, change in resp sounds, almost plugged off ~1200 with desat to 82%)  ?Provider response See new orders  ?Date of Provider Response 03/13/22  ?Time of Provider Response 1304  ? ? ? 03/13/22 1630  ?Provider Notification  ?Provider Name/Title B. Janee Morn  ?Date Provider Notified 03/13/22  ?Time Provider Notified [phone]  ?Notification Type Page  ?Notification Reason Other (Comment) ?(no UOP since 1000, bladder scan only )  ?Provider response See new orders ?([place])  ?Date of Provider Response 03/13/22  ?Time of Provider Response 1639  ? ?
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
## 2022-03-13 NOTE — Progress Notes (Addendum)
Patient ID: Carl Carpenter, male   DOB: [birth year], 36 y.o.   MRN: [id number] ?Follow up - Trauma Critical Care ?  ?Patient Details:  ?  ?Carl Carpenter is an 36 y.o. male. ? ?Lines/tubes ?: ?Airway 7.5 mm (Active)  ?Secured at (cm) 25 cm 03/13/22 0300  ?Measured From Lips 03/13/22 0300  ?[place] 03/13/22 0300  ?Secured By [employer] 03/13/22 0300  ?Tube Holder Repositioned Yes 03/13/22 0300  ?Prone position No 03/13/22 0300  ?Cuff Pressure (cm H2O) Green OR 18-26 CmH2O 03/13/22 0300  ?Site Condition Dry 03/13/22 0300  ?   ?NG/OG Vented/Dual Lumen 18 Fr. Oral Marking at nare/corner of mouth (Active)  ?Tube Position (Required) External length of tube 03/12/22 2000  ?Ongoing Placement Verification (Required) (See row information) Yes 03/12/22 2000  ?Site Assessment Clean, Dry, Intact 03/12/22 2000  ?Interventions Cleansed 03/11/22 2000  ?Status Feeding 03/12/22 2000  ?Drainage Appearance None 03/11/22 2000  ?Intake (mL) 80 mL 03/12/22 1400  ?   ?External Urinary Catheter (Active)  ?Collection Container Standard drainage bag 03/12/22 2000  ?Suction (Verified suction is between 40-80 mmHg) N/A (Patient has condom catheter) 03/12/22 2000  ?Site Assessment Clean, Dry, Intact 03/12/22 2000  ?Output (mL) 750 mL 03/13/22 0600  ? ? ?Microbiology/Sepsis markers: ?Results for orders placed or performed during the hospital encounter of 03/10/22  ?Resp Panel by RT-PCR (Flu A&B, Covid) Nasopharyngeal Swab     Status: None  ? Collection Time: 03/10/22  9:17 PM  ? Specimen: Nasopharyngeal Swab; Nasopharyngeal(NP) swabs in vial transport medium  ?Result Value Ref Range Status  ? SARS Coronavirus 2 by RT PCR NEGATIVE NEGATIVE Final  ?  Comment: (NOTE) ?SARS-CoV-2 target nucleic acids are NOT DETECTED. ? ?The SARS-CoV-2 RNA is generally detectable in upper respiratory ?specimens during the acute phase of infection. The lowest ?concentration of SARS-CoV-2 viral copies this assay can detect is ?138 copies/mL.  A negative result does not preclude SARS-Cov-2 ?infection and should not be used as the sole basis for treatment or ?other patient management decisions. A negative result may occur with  ?improper specimen collection/handling, submission of specimen other ?than nasopharyngeal swab, presence of viral mutation(s) within the ?areas targeted by this assay, and inadequate number of viral ?copies(<138 copies/mL). A negative result must be combined with ?clinical observations, patient history, and epidemiological ?information. The expected result is Negative. ? ?Fact Sheet for Patients:  ?EntrepreneurPulse.com.au ? ?Fact Sheet for Healthcare Providers:  ?IncredibleEmployment.be ? ?This test is no t yet approved or cleared by the [place] FDA and  ?has been authorized for detection and/or diagnosis of SARS-CoV-2 by ?FDA under an Emergency Use Authorization (EUA). This EUA will remain  ?in effect (meaning this test can be used) for the duration of the ?COVID-19 declaration under Section 564(b)(1) of the Act, 21 ?U.S.C.section 360bbb-3(b)(1), unless the authorization is terminated  ?or revoked sooner.  ? ? ?  ? Influenza A by PCR NEGATIVE NEGATIVE Final  ? Influenza B by PCR NEGATIVE NEGATIVE Final  ?  Comment: (NOTE) ?The Xpert Xpress SARS-CoV-2/FLU/RSV plus assay is intended as an aid ?in the diagnosis of influenza from Nasopharyngeal swab specimens and ?should not be used as a sole basis for treatment. Nasal washings and ?aspirates are unacceptable for Xpert Xpress SARS-CoV-2/FLU/RSV ?testing. ? ?Fact Sheet for Patients: ?EntrepreneurPulse.com.au ? ?Fact Sheet for Healthcare Providers: ?IncredibleEmployment.be ? ?This test is not yet approved or cleared by the [place] FDA and ?has been authorized for detection and/or diagnosis of SARS-CoV-2  by ?FDA under an Emergency Use Authorization (EUA). This EUA will remain ?in effect (meaning this test  can be used) for the duration of the ?COVID-19 declaration under Section 564(b)(1) of the Act, 21 U.S.C. ?section 360bbb-3(b)(1), unless the authorization is terminated or ?revoked. ? ?Performed at [facility], [address], [place], [place] ?[address] ?  ?MRSA Next Gen by PCR, Nasal     Status: None  ? Collection Time: 03/10/22 11:10 PM  ? Specimen: Nasal Mucosa; Nasal Swab  ?Result Value Ref Range Status  ? MRSA by PCR Next Gen NOT DETECTED NOT DETECTED Final  ?  Comment: (NOTE) ?The GeneXpert MRSA Assay (FDA approved for NASAL specimens only), ?is one component of a comprehensive MRSA colonization surveillance ?program. It is not intended to diagnose MRSA infection nor to guide ?or monitor treatment for MRSA infections. ?Test performance is not FDA approved in patients less than 2 years ?old. ?Performed at [facility], [place] [address], [place], [place] ?[address] ?  ? ? ?Anti-infectives:  ?Anti-infectives (From admission, onward)  ? ? Start     Dose/Rate Route Frequency Ordered Stop  ? 03/11/22 0915  clindamycin (CLEOCIN) 75 MG/5ML solution 150 mg       ? 150 mg Per Tube Every 8 hours 03/11/22 0823 03/16/22 0559  ? 03/10/22 2215  ceFAZolin (ANCEF) IVPB 2g/100 mL premix       ? 2 g ?200 mL/hr over 30 Minutes Intravenous  Once 03/10/22 2202 03/10/22 2307  ? ?  ? ? ?Best Practice/Protocols:  ?VTE Prophylaxis: Mechanical ?Continous Sedation ? ?Consults: ?Treatment Team:  ?Earnie Larsson, MD  ? ? ?Studies: ? ? ? ?Events: ? ?Subjective:  ?  ?Overnight Issues:  ? ?Objective:  ?Vital signs for last 24 hours: ?Temp:  [98.9 ?F (37.2 ?C)-100.4 ?F (38 ?C)] 99.9 ?F (37.7 ?C) (03/24 0400) ?Pulse Rate:  [72-115] 82 (03/24 0700) ?Resp:  [7-29] 21 (03/24 0700) ?BP: (97-143)/(56-99) 107/65 (03/24 0700) ?SpO2:  [97 %-100 %] 97 % (03/24 0700) ?FiO2 (%):  [40 %] 40 % (03/24 0300) ? ?Hemodynamic parameters for last 24 hours: ?  ? ?Intake/Output from previous day: ?03/23 0701 - 03/24 0700 ?In: 2641.5 [I.V.:601.5;  NG/GT:1840; IV [id number] ?Out: 1050 [Urine:1050]  ?Intake/Output this shift: ?No intake/output data recorded. ? ?Vent settings for last 24 hours: ?Vent Mode: PRVC ?FiO2 (%):  [40 %] 40 % ?Set Rate:  [20 bmp] 20 bmp ?Vt Set:  [710 mL] 710 mL ?PEEP:  [5 cmH20] 5 cmH20 ?Pressure Support:  [10 cmH20] 10 cmH20 ?Plateau Pressure:  [16 cmH20-23 cmH20] 18 cmH20 ? ?Physical Exam:  ?General: on vent ?Neuro: pupils small, moves hands to stim, not F/C clearly ?HEENT/Neck: ETT and collar, ear lac R ?Resp: clear to auscultation bilaterally ?CVS: RRR ?GI: soft, NT ?Extremities: calves soft ? ?Results for orders placed or performed during the hospital encounter of 03/10/22 (from the past 24 hour(s))  ?Glucose, capillary     Status: Abnormal  ? Collection Time: 03/12/22 11:24 AM  ?Result Value Ref Range  ? Glucose-Capillary 126 (H) 70 - 99 mg/dL  ?Glucose, capillary     Status: Abnormal  ? Collection Time: 03/12/22  3:46 PM  ?Result Value Ref Range  ? Glucose-Capillary 145 (H) 70 - 99 mg/dL  ?Magnesium     Status: None  ? Collection Time: 03/12/22  4:47 PM  ?Result Value Ref Range  ? Magnesium 2.1 1.7 - 2.4 mg/dL  ?Phosphorus     Status: Abnormal  ?  Collection Time: 03/12/22  4:47 PM  ?Result Value Ref Range  ? Phosphorus 2.4 (L) 2.5 - 4.6 mg/dL  ?Glucose, capillary     Status: Abnormal  ? Collection Time: 03/12/22  7:11 PM  ?Result Value Ref Range  ? Glucose-Capillary 120 (H) 70 - 99 mg/dL  ?Glucose, capillary     Status: Abnormal  ? Collection Time: 03/12/22 11:10 PM  ?Result Value Ref Range  ? Glucose-Capillary 154 (H) 70 - 99 mg/dL  ?Glucose, capillary     Status: Abnormal  ? Collection Time: 03/13/22  3:27 AM  ?Result Value Ref Range  ? Glucose-Capillary 138 (H) 70 - 99 mg/dL  ?CBC     Status: Abnormal  ? Collection Time: 03/13/22  6:20 AM  ?Result Value Ref Range  ? WBC 12.5 (H) 4.0 - 10.5 K/uL  ? RBC 3.85 (L) 4.22 - 5.81 MIL/uL  ? Hemoglobin 11.9 (L) 13.0 - 17.0 g/dL  ? HCT 35.4 (L) 39.0 - 52.0 %  ? MCV 91.9 80.0 - 100.0  fL  ? MCH 30.9 26.0 - 34.0 pg  ? MCHC 33.6 30.0 - 36.0 g/dL  ? RDW 12.4 11.5 - 15.5 %  ? Platelets 125 (L) 150 - 400 K/uL  ? nRBC 0.0 0.0 - 0.2 %  ?Basic metabolic panel     Status: Abnormal  ? Collection Time

## 2022-03-14 LAB — BASIC METABOLIC PANEL
Anion gap: 5 (ref 5–15)
BUN: 19 mg/dL (ref 6–20)
CO2: 24 mmol/L (ref 22–32)
Calcium: 8.6 mg/dL — ABNORMAL LOW (ref 8.9–10.3)
Chloride: 110 mmol/L (ref 98–111)
Creatinine, Ser: 0.77 mg/dL (ref 0.61–1.24)
GFR, Estimated: >60 mL/min (ref >60)
Glucose, Bld: 112 mg/dL — ABNORMAL HIGH (ref 70–99)
Potassium: 4 mmol/L (ref 3.5–5.1)
Sodium: 139 mmol/L (ref 135–145)

## 2022-03-14 LAB — GLUCOSE, CAPILLARY
Glucose-Capillary: 116 mg/dL — ABNORMAL HIGH (ref 70–99)
Glucose-Capillary: 147 mg/dL — ABNORMAL HIGH (ref 70–99)
Glucose-Capillary: 127 mg/dL — ABNORMAL HIGH (ref 70–99)
Glucose-Capillary: 145 mg/dL — ABNORMAL HIGH (ref 70–99)
Glucose-Capillary: 129 mg/dL — ABNORMAL HIGH (ref 70–99)
Glucose-Capillary: 124 mg/dL — ABNORMAL HIGH (ref 70–99)

## 2022-03-14 LAB — CBC
HCT: 33.3 % — ABNORMAL LOW (ref 39.0–52.0)
Hemoglobin: 11.4 g/dL — ABNORMAL LOW (ref 13.0–17.0)
MCH: 31.6 pg (ref 26.0–34.0)
MCHC: 34.2 g/dL (ref 30.0–36.0)
MCV: 92.2 fL (ref 80.0–100.0)
Platelets: 130 10*3/uL — ABNORMAL LOW (ref 150–400)
RBC: 3.61 MIL/uL — ABNORMAL LOW (ref 4.22–5.81)
RDW: 12.2 % (ref 11.5–15.5)
WBC: 14.6 10*3/uL — ABNORMAL HIGH (ref 4.0–10.5)
nRBC: 0 % (ref 0.0–0.2)

## 2022-03-14 MED ORDER — SODIUM CHLORIDE 0.9 % IV SOLN
2.0000 g | Freq: Three times a day (TID) | INTRAVENOUS | Status: DC
  Administered 2022-03-14 – 2022-03-16 (×7): 2 g via INTRAVENOUS
  Filled 2022-03-14 (×7): qty 2

## 2022-03-14 MED ORDER — SODIUM CHLORIDE 0.9 % IV SOLN: 2.0000 g | Freq: Two times a day (BID) | INTRAVENOUS | Status: DC

## 2022-03-14 MED ORDER — BETHANECHOL CHLORIDE 25 MG PO TABS
25.0000 mg | ORAL_TABLET | Freq: Three times a day (TID) | ORAL | Status: DC
  Administered 2022-03-14 – 2022-03-17 (×12): 25 mg
  Filled 2022-03-14 (×12): qty 1

## 2022-03-14 MED ORDER — ENOXAPARIN SODIUM 30 MG/0.3ML IJ SOSY
30.0000 mg | PREFILLED_SYRINGE | Freq: Two times a day (BID) | INTRAMUSCULAR | Status: DC
  Administered 2022-03-14 – 2022-03-22 (×17): 30 mg via SUBCUTANEOUS
  Filled 2022-03-14 (×17): qty 0.3

## 2022-03-14 NOTE — Progress Notes (Signed)
PHARMACY NOTE:  ANTIMICROBIAL RENAL DOSAGE ADJUSTMENT ? ?Current antimicrobial regimen includes a mismatch between antimicrobial dosage and estimated renal function.  As per policy approved by the Pharmacy & Therapeutics and Medical Executive Committees, the antimicrobial dosage will be adjusted accordingly. ? ?Current antimicrobial dosage:  Cefepime 2gm IV q12h ? ?Indication: HCAP ? ?Renal Function: ? ?Estimated Creatinine Clearance: 182.5 mL/min (by C-G formula based on SCr of 0.77 mg/dL). ? ?   ?Antimicrobial dosage has been changed to:  Cefepime 1gm IV q8h ? ? ?Thank you for allowing pharmacy to be a part of this patient's care. ? ?Sherlon Handing, PharmD, BCPS ?Please see amion for complete clinical pharmacist phone list ?03/14/2022 2:31 PM ?

## 2022-03-14 NOTE — Progress Notes (Addendum)
Patient ID: Carl Carpenter, male   DOB: [birth year], 36 y.o.   MRN: [id number] ?Follow up - Trauma Critical Care ?  ?Patient Details:  ?  ?Carl Carpenter is an 36 y.o. male. ? ?Lines/tubes ?: ?Airway 7.5 mm (Active)  ?Secured at (cm) 25 cm 03/14/22 0739  ?Measured From Lips 03/14/22 0739  ?[place] 03/14/22 [phone]  ?Secured By [employer] 03/14/22 0739  ?Tube Holder Repositioned Yes 03/14/22 0739  ?Prone position No 03/14/22 0739  ?Cuff Pressure (cm H2O) Green OR 18-26 [facility] 03/14/22 0739  ?Site Condition Dry 03/14/22 0739  ?   ?NG/OG Vented/Dual Lumen 18 Fr. Oral Marking at nare/corner of mouth (Active)  ?Tube Position (Required) External length of tube 03/14/22 0800  ?Measurement (cm) (Required) 36 cm 03/14/22 0800  ?Ongoing Placement Verification (Required) (See row information) Yes 03/14/22 0800  ?Site Assessment Clean, Dry, Intact 03/14/22 0800  ?Interventions Cleansed 03/13/22 2000  ?Status Feeding 03/14/22 0800  ?Drainage Appearance None 03/11/22 2000  ?Intake (mL) 80 mL 03/12/22 1400  ?   ?External Urinary Catheter (Active)  ?Collection Container Standard drainage bag 03/13/22 2000  ?Suction (Verified suction is between 40-80 mmHg) N/A (Patient has condom catheter) 03/13/22 2000  ?Site Assessment Clean, Dry, Intact 03/13/22 2000  ?Intervention Male External Urinary Catheter Replaced 03/13/22 1426  ?Output (mL) 0 mL 03/13/22 1800  ? ? ?Microbiology/Sepsis markers: ?Results for orders placed or performed during the hospital encounter of 03/10/22  ?Resp Panel by RT-PCR (Flu A&B, Covid) Nasopharyngeal Swab     Status: None  ? Collection Time: 03/10/22  9:17 PM  ? Specimen: Nasopharyngeal Swab; Nasopharyngeal(NP) swabs in vial transport medium  ?Result Value Ref Range Status  ? SARS Coronavirus 2 by RT PCR NEGATIVE NEGATIVE Final  ?  Comment: (NOTE) ?SARS-CoV-2 target nucleic acids are NOT DETECTED. ? ?The SARS-CoV-2 RNA is generally detectable in upper respiratory ?specimens during the  acute phase of infection. The lowest ?concentration of SARS-CoV-2 viral copies this assay can detect is ?138 copies/mL. A negative result does not preclude SARS-Cov-2 ?infection and should not be used as the sole basis for treatment or ?other patient management decisions. A negative result may occur with  ?improper specimen collection/handling, submission of specimen other ?than nasopharyngeal swab, presence of viral mutation(s) within the ?areas targeted by this assay, and inadequate number of viral ?copies(<138 copies/mL). A negative result must be combined with ?clinical observations, patient history, and epidemiological ?information. The expected result is Negative. ? ?Fact Sheet for Patients:  ?EntrepreneurPulse.com.au ? ?Fact Sheet for Healthcare Providers:  ?IncredibleEmployment.be ? ?This test is no t yet approved or cleared by the [place] FDA and  ?has been authorized for detection and/or diagnosis of SARS-CoV-2 by ?FDA under an Emergency Use Authorization (EUA). This EUA will remain  ?in effect (meaning this test can be used) for the duration of the ?COVID-19 declaration under Section 564(b)(1) of the Act, 21 ?U.S.C.section 360bbb-3(b)(1), unless the authorization is terminated  ?or revoked sooner.  ? ? ?  ? Influenza A by PCR NEGATIVE NEGATIVE Final  ? Influenza B by PCR NEGATIVE NEGATIVE Final  ?  Comment: (NOTE) ?The Xpert Xpress SARS-CoV-2/FLU/RSV plus assay is intended as an aid ?in the diagnosis of influenza from Nasopharyngeal swab specimens and ?should not be used as a sole basis for treatment. Nasal washings and ?aspirates are unacceptable for Xpert Xpress SARS-CoV-2/FLU/RSV ?testing. ? ?Fact Sheet for Patients: ?EntrepreneurPulse.com.au ? ?Fact Sheet for Healthcare Providers: ?IncredibleEmployment.be ? ?This test is not yet approved  or cleared by the [place] and ?has been authorized for detection and/or  diagnosis of SARS-CoV-2 by ?FDA under an Emergency Use Authorization (EUA). This EUA will remain ?in effect (meaning this test can be used) for the duration of the ?COVID-19 declaration under Section 564(b)(1) of the Act, 21 U.S.C. ?section 360bbb-3(b)(1), unless the authorization is terminated or ?revoked. ? ?Performed at [facility], [place] [address], [place], [place] ?[address] ?  ?MRSA Next Gen by PCR, Nasal     Status: None  ? Collection Time: 03/10/22 11:10 PM  ? Specimen: Nasal Mucosa; Nasal Swab  ?Result Value Ref Range Status  ? MRSA by PCR Next Gen NOT DETECTED NOT DETECTED Final  ?  Comment: (NOTE) ?The GeneXpert MRSA Assay (FDA approved for NASAL specimens only), ?is one component of a comprehensive MRSA colonization surveillance ?program. It is not intended to diagnose MRSA infection nor to guide ?or monitor treatment for MRSA infections. ?Test performance is not FDA approved in patients less than 2 years ?old. ?Performed at [facility], [place] [address] [place], [place] ?[address] ?  ?Culture, Respiratory w Gram Stain     Status: None (Preliminary result)  ? Collection Time: 03/13/22  3:36 PM  ? Specimen: Tracheal Aspirate; Respiratory  ?Result Value Ref Range Status  ? Specimen Description TRACHEAL ASPIRATE  Final  ? Special Requests NONE  Final  ? Gram Stain   Final  ?  FEW WBC PRESENT,BOTH PMN AND MONONUCLEAR ?MODERATE GRAM VARIABLE ROD ?FEW GRAM POSITIVE COCCI ?RARE BUDDING YEAST SEEN ?Performed at [facility], [place] [address], [place], [place] [address] ?  ? Culture PENDING  Incomplete  ? Report Status PENDING  Incomplete  ? ? ?Anti-infectives:  ?Anti-infectives (From admission, onward)  ? ? Start     Dose/Rate Route Frequency Ordered Stop  ? 03/14/22 1015  ceFEPIme (MAXIPIME) 2 g in sodium chloride 0.9 % 100 mL IVPB       ? 2 g ?200 mL/hr over 30 Minutes Intravenous Every 12 hours 03/14/22 0926    ? 03/11/22 0915  clindamycin (CLEOCIN) 75 MG/5ML solution 150 mg       ? 150 mg Per  Tube Every 8 hours 03/11/22 0823 03/16/22 0559  ? 03/10/22 2215  ceFAZolin (ANCEF) IVPB 2g/100 mL premix       ? 2 g ?200 mL/hr over 30 Minutes Intravenous  Once 03/10/22 2202 03/10/22 2307  ? ?  ? ?Consults: ?Treatment Team:  ?Earnie Larsson, MD  ? ? ?Studies: ? ? ? ?Events: ? ?Subjective:  ?  ?Overnight Issues:  ? ?Objective:  ?Vital signs for last 24 hours: ?Temp:  [98.6 ?F (37 ?C)-101.2 ?F (38.4 ?C)] 99 ?F (37.2 ?C) (03/25 0800) ?Pulse Rate:  [74-95] 82 (03/25 0900) ?Resp:  [20-43] 23 (03/25 0900) ?BP: (98-187)/(53-112) 122/62 (03/25 0900) ?SpO2:  [91 %-100 %] 97 % (03/25 0900) ?FiO2 (%):  [40 %] 40 % (03/25 0739) ?Weight:  KB:[id number] kg] 119 kg (03/25 0500) ? ?Hemodynamic parameters for last 24 hours: ?  ? ?Intake/Output from previous day: ?03/24 0701 - 03/25 0700 ?In: 3731.7 [I.V.:731.5; NG/GT:2000; IV Piggyback:1000.2] ?Out: 1425 [Urine:1425]  ?Intake/Output this shift: ?Total I/O ?In: 129.1 [I.V.:49.1; NG/GT:80] ?Out: -  ? ?Vent settings for last 24 hours: ?Vent Mode: CPAP;PSV ?FiO2 (%):  [40 %] 40 % ?Set Rate:  [20 bmp] 20 bmp ?Vt Set:  [710 mL] 710 mL ?PEEP:  [5 cmH20] 5 cmH20 ?Pressure Support:  [10 cmH20] 10 cmH20 ?  Plateau Pressure:  [18 cmH20-20 cmH20] 20 cmH20 ? ?Physical Exam:  ?General: on vent ?Neuro: coughing to stim, ext posture RUE, pupils small ?HEENT/Neck: ETT ?Resp: some rhonchi ?CVS: RRR ?GI: soft, NT ?Extremities: calves soft ? ?Results for orders placed or performed during the hospital encounter of 03/10/22 (from the past 24 hour(s))  ?Glucose, capillary     Status: Abnormal  ? Collection Time: 03/13/22 11:32 AM  ?Result Value Ref Range  ? Glucose-Capillary 127 (H) 70 - 99 mg/dL  ?Glucose, capillary     Status: Abnormal  ? Collection Time: 03/13/22  3:05 PM  ?Result Value Ref Range  ? Glucose-Capillary 139 (H) 70 - 99 mg/dL  ?Culture, Respiratory w Gram Stain     Status: None (Preliminary result)  ? Collection Time: 03/13/22  3:36 PM  ? Specimen: Tracheal Aspirate; Respiratory  ?Result Value Ref  Range  ? Specimen Description TRACHEAL ASPIRATE   ? Special Requests NONE   ? Gram Stain    ?  FEW WBC PRESENT,BOTH PMN AND MONONUCLEAR ?MODERATE GRAM VARIABLE ROD ?FEW GRAM POSITIVE COCCI ?RARE BUDDING YEAST SEE

## 2022-03-14 NOTE — Progress Notes (Signed)
?  NEUROSURGERY PROGRESS NOTE  ? ?No issues overnight.  ? ?EXAM:  ?BP 122/62   Pulse 82   Temp 99 ?F (37.2 ?C) (Oral)   Resp (!) 23   Ht 6\' 5"  (1.956 m)   Wt 119 kg   SpO2 97%   BMI 31.11 kg/m?  ? ?On some sedation: ?No eye opening, ? Grimace to pain ?Pupils pinpoint ?Breathing over vent ?Extensor posturing RUE to central pain ? ?IMPRESSION:  ?36 y.o. 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 ?PLAN: ?- Cont supportive care per trauma ?- Can start chemical DVT prophylaxis ? ? ?[facility], MD ?[facility]  ? ?

## 2022-03-14 NOTE — Plan of Care (Signed)
?  Problem: Nutrition: ?Goal: Adequate nutrition will be maintained ?Outcome: Progressing ?  ?Problem: Coping: ?Goal: Level of anxiety will decrease ?Outcome: Progressing ?  ?Problem: Elimination: ?Goal: Will not experience complications related to urinary retention ?Outcome: Not Met (add Reason) ?Note: Foley replaced, retention meds started ?  ?Problem: Elimination: ?Goal: Will not experience complications related to bowel motility ?Note: Medications given ?  ?

## 2022-03-15 LAB — BASIC METABOLIC PANEL
Anion gap: 7 (ref 5–15)
BUN: 21 mg/dL — ABNORMAL HIGH (ref 6–20)
CO2: 24 mmol/L (ref 22–32)
Calcium: 8.5 mg/dL — ABNORMAL LOW (ref 8.9–10.3)
Chloride: 109 mmol/L (ref 98–111)
Creatinine, Ser: 0.89 mg/dL (ref 0.61–1.24)
GFR, Estimated: >60 mL/min (ref >60)
Glucose, Bld: 139 mg/dL — ABNORMAL HIGH (ref 70–99)
Potassium: 3.9 mmol/L (ref 3.5–5.1)
Sodium: 140 mmol/L (ref 135–145)

## 2022-03-15 LAB — CULTURE, RESPIRATORY W GRAM STAIN: Culture: NORMAL

## 2022-03-15 LAB — GLUCOSE, CAPILLARY
Glucose-Capillary: 151 mg/dL — ABNORMAL HIGH (ref 70–99)
Glucose-Capillary: 178 mg/dL — ABNORMAL HIGH (ref 70–99)
Glucose-Capillary: 144 mg/dL — ABNORMAL HIGH (ref 70–99)
Glucose-Capillary: 138 mg/dL — ABNORMAL HIGH (ref 70–99)
Glucose-Capillary: 140 mg/dL — ABNORMAL HIGH (ref 70–99)
Glucose-Capillary: 138 mg/dL — ABNORMAL HIGH (ref 70–99)

## 2022-03-15 LAB — CBC
HCT: 32.8 % — ABNORMAL LOW (ref 39.0–52.0)
Hemoglobin: 10.9 g/dL — ABNORMAL LOW (ref 13.0–17.0)
MCH: 31 pg (ref 26.0–34.0)
MCHC: 33.2 g/dL (ref 30.0–36.0)
MCV: 93.2 fL (ref 80.0–100.0)
Platelets: 161 10*3/uL (ref 150–400)
RBC: 3.52 MIL/uL — ABNORMAL LOW (ref 4.22–5.81)
RDW: 12.2 % (ref 11.5–15.5)
WBC: 14 10*3/uL — ABNORMAL HIGH (ref 4.0–10.5)
nRBC: 0 % (ref 0.0–0.2)

## 2022-03-15 LAB — HEMOGLOBIN A1C
Hgb A1c MFr Bld: 5.2 % (ref 4.8–5.6)
Mean Plasma Glucose: 102.54 mg/dL

## 2022-03-15 MED ORDER — INSULIN ASPART 100 UNIT/ML IJ SOLN
0.0000 [IU] | INTRAMUSCULAR | Status: DC
  Administered 2022-03-15 (×3): 2 [IU] via SUBCUTANEOUS
  Administered 2022-03-15: 3 [IU] via SUBCUTANEOUS
  Administered 2022-03-15 – 2022-04-04 (×51): 2 [IU] via SUBCUTANEOUS
  Administered 2022-04-04: 3 [IU] via SUBCUTANEOUS
  Administered 2022-04-04 – 2022-05-05 (×109): 2 [IU] via SUBCUTANEOUS
  Administered 2022-05-05: 3 [IU] via SUBCUTANEOUS
  Administered 2022-05-05 – 2022-05-08 (×9): 2 [IU] via SUBCUTANEOUS

## 2022-03-15 MED ORDER — POLYETHYLENE GLYCOL 3350 17 G PO PACK
17.0000 g | PACK | Freq: Two times a day (BID) | ORAL | Status: DC
  Administered 2022-03-15 – 2022-03-29 (×17): 17 g
  Filled 2022-03-15 (×19): qty 1

## 2022-03-15 MED ORDER — MAGNESIUM HYDROXIDE 400 MG/5ML PO SUSP
30.0000 mL | Freq: Once | ORAL | Status: AC
  Administered 2022-03-15: 30 mL
  Filled 2022-03-15: qty 30

## 2022-03-15 NOTE — Progress Notes (Signed)
New rash and serous blisters noted on left leg where skin contacted bed pad. Foam placed over blister and Dr. Grandville Silos made aware. Temp continues to be high, ice packs are in place. Ordering cooling blanket per Dr. Grandville Silos. Will continue to monitor.  ?

## 2022-03-15 NOTE — Progress Notes (Signed)
Patient ID: Carl Carpenter, male   DOB: 07-20-1986, 35 y.o.   MRN: [id number] ?Follow up - Trauma Critical Care ?  ?Patient Details:  ?  ?Carl Carpenter is an 36 y.o. male. ? ?Lines/tubes ?: ?Airway 7.5 mm (Active)  ?Secured at (cm) 25 cm 03/15/22 0741  ?Measured From Lips 03/15/22 0741  ?Secured Location Center 03/15/22 [phone]  ?Secured By [employer] 03/15/22 0741  ?Tube Holder Repositioned Yes 03/15/22 0741  ?Prone position No 03/15/22 0353  ?Cuff Pressure (cm H2O) Green OR 18-26 [facility] 03/15/22 0741  ?Site Condition Dry 03/15/22 0353  ?   ?NG/OG Vented/Dual Lumen 18 Fr. Oral Marking at nare/corner of mouth (Active)  ?Tube Position (Required) External length of tube 03/14/22 2000  ?Measurement (cm) (Required) 36 cm 03/14/22 2000  ?Ongoing Placement Verification (Required) (See row information) Yes 03/14/22 2000  ?Site Assessment Clean, Dry, Intact 03/14/22 2000  ?Interventions Cleansed 03/13/22 2000  ?Status Feeding 03/14/22 2000  ?Drainage Appearance None 03/11/22 2000  ?Intake (mL) 80 mL 03/12/22 1400  ?   ?Urethral Catheter Andreas Ohm RN Latex;Straight-tip 16 Fr. (Active)  ?Indication for Insertion or Continuance of Catheter Acute urinary retention (I&O Cath for 24 hrs prior to catheter insertion- Inpatient Only) 03/14/22 2000  ?Site Assessment Clean, Dry, Intact 03/14/22 2000  ?Catheter Maintenance Bag below level of bladder;Catheter secured;Drainage bag/tubing not touching floor;Insertion date on drainage bag;No dependent loops;Seal intact 03/14/22 2000  ?Collection Container Standard drainage bag 03/14/22 2000  ?Securement Method Securing device (Describe) 03/14/22 2000  ?Output (mL) 75 mL 03/15/22 0600  ? ? ?Microbiology/Sepsis markers: ?Results for orders placed or performed during the hospital encounter of 03/10/22  ?Resp Panel by RT-PCR (Flu A&B, Covid) Nasopharyngeal Swab     Status: None  ? Collection Time: 03/10/22  9:17 PM  ? Specimen: Nasopharyngeal Swab; Nasopharyngeal(NP) swabs in vial  transport medium  ?Result Value Ref Range Status  ? SARS Coronavirus 2 by RT PCR NEGATIVE NEGATIVE Final  ?  Comment: (NOTE) ?SARS-CoV-2 target nucleic acids are NOT DETECTED. ? ?The SARS-CoV-2 RNA is generally detectable in upper respiratory ?specimens during the acute phase of infection. The lowest ?concentration of SARS-CoV-2 viral copies this assay can detect is ?138 copies/mL. A negative result does not preclude SARS-Cov-2 ?infection and should not be used as the sole basis for treatment or ?other patient management decisions. A negative result may occur with  ?improper specimen collection/handling, submission of specimen other ?than nasopharyngeal swab, presence of viral mutation(s) within the ?areas targeted by this assay, and inadequate number of viral ?copies(<138 copies/mL). A negative result must be combined with ?clinical observations, patient history, and epidemiological ?information. The expected result is Negative. ? ?Fact Sheet for Patients:  ?BloggerCourse.com ? ?Fact Sheet for Healthcare Providers:  ?SeriousBroker.it ? ?This test is no t yet approved or cleared by the [place] FDA and  ?has been authorized for detection and/or diagnosis of SARS-CoV-2 by ?FDA under an Emergency Use Authorization (EUA). This EUA will remain  ?in effect (meaning this test can be used) for the duration of the ?COVID-19 declaration under Section 564(b)(1) of the Act, 21 ?U.S.C.section 360bbb-3(b)(1), unless the authorization is terminated  ?or revoked sooner.  ? ? ?  ? Influenza A by PCR NEGATIVE NEGATIVE Final  ? Influenza B by PCR NEGATIVE NEGATIVE Final  ?  Comment: (NOTE) ?The Xpert Xpress SARS-CoV-2/FLU/RSV plus assay is intended as an aid ?in the diagnosis of influenza from Nasopharyngeal swab specimens and ?should not be used as a  sole basis for treatment. Nasal washings and ?aspirates are unacceptable for Xpert Xpress SARS-CoV-2/FLU/RSV ?testing. ? ?Fact  Sheet for Patients: ?BloggerCourse.com ? ?Fact Sheet for Healthcare Providers: ?SeriousBroker.it ? ?This test is not yet approved or cleared by the [place] FDA and ?has been authorized for detection and/or diagnosis of SARS-CoV-2 by ?FDA under an Emergency Use Authorization (EUA). This EUA will remain ?in effect (meaning this test can be used) for the duration of the ?COVID-19 declaration under Section 564(b)(1) of the Act, 21 U.S.C. ?section 360bbb-3(b)(1), unless the authorization is terminated or ?revoked. ? ?Performed at [facility], [address] [place], [place] ?[address] ?  ?MRSA Next Gen by PCR, Nasal     Status: None  ? Collection Time: 03/10/22 11:10 PM  ? Specimen: Nasal Mucosa; Nasal Swab  ?Result Value Ref Range Status  ? MRSA by PCR Next Gen NOT DETECTED NOT DETECTED Final  ?  Comment: (NOTE) ?The GeneXpert MRSA Assay (FDA approved for NASAL specimens only), ?is one component of a comprehensive MRSA colonization surveillance ?program. It is not intended to diagnose MRSA infection nor to guide ?or monitor treatment for MRSA infections. ?Test performance is not FDA approved in patients less than 2 years ?old. ?Performed at [facility], [address], [place], [place] ?[address] ?  ?Culture, Respiratory w Gram Stain     Status: None (Preliminary result)  ? Collection Time: 03/13/22  3:36 PM  ? Specimen: Tracheal Aspirate; Respiratory  ?Result Value Ref Range Status  ? Specimen Description TRACHEAL ASPIRATE  Final  ? Special Requests NONE  Final  ? Gram Stain   Final  ?  FEW WBC PRESENT,BOTH PMN AND MONONUCLEAR ?MODERATE GRAM VARIABLE ROD ?FEW GRAM POSITIVE COCCI ?RARE BUDDING YEAST SEEN ?  ? Culture   Final  ?  CULTURE REINCUBATED FOR BETTER GROWTH ?Performed at [facility], [address], [place], [place] [address] ?  ? Report Status PENDING  Incomplete  ? ? ?Anti-infectives:  ?Anti-infectives (From admission, onward)  ? ?  Start     Dose/Rate Route Frequency Ordered Stop  ? 03/14/22 1015  ceFEPIme (MAXIPIME) 2 g in sodium chloride 0.9 % 100 mL IVPB  Status:  Discontinued       ? 2 g ?200 mL/hr over 30 Minutes Intravenous Every 12 hours 03/14/22 0926 03/14/22 0934  ? 03/14/22 1000  ceFEPIme (MAXIPIME) 2 g in sodium chloride 0.9 % 100 mL IVPB       ? 2 g ?200 mL/hr over 30 Minutes Intravenous Every 8 hours 03/14/22 0935    ? 03/11/22 0915  clindamycin (CLEOCIN) 75 MG/5ML solution 150 mg       ? 150 mg Per Tube Every 8 hours 03/11/22 0823 03/16/22 0559  ? 03/10/22 2215  ceFAZolin (ANCEF) IVPB 2g/100 mL premix       ? 2 g ?200 mL/hr over 30 Minutes Intravenous  Once 03/10/22 2202 03/10/22 2307  ? ?  ? ? ?Best Practice/Protocols:  ?VTE Prophylaxis: Lovenox (prophylaxtic dose) and Mechanical ?Continous Sedation ? ?Consults: ?Treatment Team:  ?Julio Sicks, MD  ? ? ?Studies: ? ? ? ?Events: ? ?Subjective:  ?  ?Overnight Issues:  ? ?Objective:  ?Vital signs for last 24 hours: ?Temp:  [99 ?F (37.2 ?C)-100.7 ?F (38.2 ?C)] 100.2 ?F (37.9 ?C) (03/26 0400) ?Pulse Rate:  [79-98] 90 (03/26 0741) ?Resp:  [21-43] 27 (03/26 0741) ?BP: (106-139)/(54-91) 135/63 (03/26 0741) ?SpO2:  [94 %-100 %] 96 % (03/26 0741) ?FiO2 (%):  [  40 %] 40 % (03/26 0749) ?Weight:  [124.8 kg] 124.8 kg (03/26 0500) ? ?Hemodynamic parameters for last 24 hours: ?  ? ?Intake/Output from previous day: ?03/25 0701 - 03/26 0700 ?In: 2639.4 [I.V.:779.5; NG/GT:1660; IV Piggyback:199.9] ?Out: 1420 [Urine:1420]  ?Intake/Output this shift: ?No intake/output data recorded. ? ?Vent settings for last 24 hours: ?Vent Mode: PSV;CPAP ?FiO2 (%):  [40 %] 40 % ?Set Rate:  [20 bmp] 20 bmp ?Vt Set:  [710 mL] 710 mL ?PEEP:  [5 cmH20] 5 cmH20 ?Pressure Support:  [10 cmH20] 10 cmH20 ?Plateau Pressure:  [19 cmH20-27 cmH20] 21 cmH20 ? ?Physical Exam:  ?General: on vent ?Neuro: pupils small, min movement to stim, no posturing ?HEENT/Neck: ETT and collar ?Resp: clear to auscultation bilaterally ?CVS: RRR ?GI:  soft, NT ?Extremities: calves soft ? ?Results for orders placed or performed during the hospital encounter of 03/10/22 (from the past 24 hour(s))  ?Glucose, capillary     Status: Abnormal  ? Collection Time:

## 2022-03-15 NOTE — Progress Notes (Signed)
?  NEUROSURGERY PROGRESS NOTE  ? ?No issues overnight. Increased secretions and work of breathing noted this am. ? ?EXAM:  ?BP 139/69   Pulse 92   Temp (!) 102.5 ?F (39.2 ?C)   Resp (!) 28   Ht 6\' 5"  (1.956 m)   Wt 124.8 kg   SpO2 98%   BMI 32.63 kg/m?  ? ?On low-dose precedex/fentanyl: ?No eye opening, No grimace to pain ?Pupils pinpoint ?Breathing over vent ?Extensor posturing RUE to central pain ? ?IMPRESSION:  ?36 y.o. 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 ?PLAN: ?- Cont supportive care per trauma ?- May consider MRI brain w/o in the next few days to better delineate brain injury and prognosis ? ? ?[facility], MD ?[facility]  ? ?

## 2022-03-15 NOTE — Progress Notes (Signed)
RT called by RN to pt's bedside. RT at bedside to find pt's RR increased, HR increased and SpO2 decreased with copious amount of tan bloody secretions. RT placed pt on full ventilator support with 100% FiO2 and lavage suctioned pt with 10cc. Pt's SpO2 still in low 80's. RT x2 removed pt from full ventilator support and manually bagged lavaged pt with improvement in SpO2 and removed copious amount of secretions. Pt stable at this time. RT will continue to monitor pt. ?

## 2022-03-16 ENCOUNTER — Inpatient Hospital Stay (HOSPITAL_COMMUNITY): Payer: Medicaid Other

## 2022-03-16 LAB — GLUCOSE, CAPILLARY
Glucose-Capillary: 135 mg/dL — ABNORMAL HIGH (ref 70–99)
Glucose-Capillary: 129 mg/dL — ABNORMAL HIGH (ref 70–99)
Glucose-Capillary: 120 mg/dL — ABNORMAL HIGH (ref 70–99)
Glucose-Capillary: 99 mg/dL (ref 70–99)
Glucose-Capillary: 130 mg/dL — ABNORMAL HIGH (ref 70–99)
Glucose-Capillary: 134 mg/dL — ABNORMAL HIGH (ref 70–99)

## 2022-03-16 LAB — CBC
HCT: 31.3 % — ABNORMAL LOW (ref 39.0–52.0)
Hemoglobin: 10.7 g/dL — ABNORMAL LOW (ref 13.0–17.0)
MCH: 31.8 pg (ref 26.0–34.0)
MCHC: 34.2 g/dL (ref 30.0–36.0)
MCV: 93.2 fL (ref 80.0–100.0)
Platelets: 171 10*3/uL (ref 150–400)
RBC: 3.36 MIL/uL — ABNORMAL LOW (ref 4.22–5.81)
RDW: 12.1 % (ref 11.5–15.5)
WBC: 12.9 10*3/uL — ABNORMAL HIGH (ref 4.0–10.5)
nRBC: 0 % (ref 0.0–0.2)

## 2022-03-16 LAB — BASIC METABOLIC PANEL
Anion gap: 5 (ref 5–15)
BUN: 20 mg/dL (ref 6–20)
CO2: 25 mmol/L (ref 22–32)
Calcium: 8.5 mg/dL — ABNORMAL LOW (ref 8.9–10.3)
Chloride: 109 mmol/L (ref 98–111)
Creatinine, Ser: 0.68 mg/dL (ref 0.61–1.24)
GFR, Estimated: >60 mL/min (ref >60)
Glucose, Bld: 132 mg/dL — ABNORMAL HIGH (ref 70–99)
Potassium: 4.1 mmol/L (ref 3.5–5.1)
Sodium: 139 mmol/L (ref 135–145)

## 2022-03-16 MED ORDER — METHOCARBAMOL 500 MG PO TABS
1000.0000 mg | ORAL_TABLET | Freq: Three times a day (TID) | ORAL | Status: DC
  Administered 2022-03-16 – 2022-04-01 (×42): 1000 mg
  Filled 2022-03-16 (×43): qty 2

## 2022-03-16 NOTE — Progress Notes (Signed)
Occupational Therapy Discharge ?Patient Details ?Name: Carl Carpenter ?MRN: [id number] ?DOB: 1986-09-24 ?Today's Date: 03/16/2022 ?Time:  -  ?  ? ?Patient discharged from OT services secondary to  RN request to sign off pending further workup for POC . ? ?Please see latest therapy progress note for current level of functioning and progress toward goals.   ? ?Progress and discharge plan discussed with patient and/or caregiver:  pt unable to agree. Discharge based on RN request at this time.  ? ?GO ?   ? ?Jeri Modena ?03/16/2022, 12:48 PM  ? ?[place], OTR/L  ?Acute Rehabilitation Services ?Pager: [phone] ?Office: [phone] ?. ? ?

## 2022-03-16 NOTE — Progress Notes (Signed)
Pt was transported to MRI and back to 4n16 without complication.  ?

## 2022-03-16 NOTE — Progress Notes (Signed)
No significant change in status.  Patient remains unconscious without evidence of awakening to noxious stimuli.  Pupils remain pinpoint.  Will flex to noxious stimuli bilaterally. ? 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  Okay to proceed with MRI scan for prognostic determination.  I discussed situation with family. ?

## 2022-03-16 NOTE — Progress Notes (Signed)
RT at bedside, audible cuff leak that did not resolve by adding air to cuff.  RT advanced ETT by 2 cm and re-inflated the cuff. Follow up X-ray was ordered.  Cuff leak has resolved for now, good volumes on vent and pt looks comfortable. ETT now secured at 27 at the lips.  ?
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
## 2022-03-17 ENCOUNTER — Encounter (HOSPITAL_COMMUNITY): Payer: Self-pay

## 2022-03-17 DIAGNOSIS — S069X9A Unspecified intracranial injury with loss of consciousness of unspecified duration, initial encounter: Secondary | ICD-10-CM

## 2022-03-17 DIAGNOSIS — Z515 Encounter for palliative care: Secondary | ICD-10-CM

## 2022-03-17 DIAGNOSIS — Z7189 Other specified counseling: Secondary | ICD-10-CM

## 2022-03-17 LAB — CBC
HCT: 31.7 % — ABNORMAL LOW (ref 39.0–52.0)
Hemoglobin: 10.6 g/dL — ABNORMAL LOW (ref 13.0–17.0)
MCH: 31.5 pg (ref 26.0–34.0)
MCHC: 33.4 g/dL (ref 30.0–36.0)
MCV: 94.1 fL (ref 80.0–100.0)
Platelets: 186 10*3/uL (ref 150–400)
RBC: 3.37 MIL/uL — ABNORMAL LOW (ref 4.22–5.81)
RDW: 12.1 % (ref 11.5–15.5)
WBC: 11.7 10*3/uL — ABNORMAL HIGH (ref 4.0–10.5)
nRBC: 0 % (ref 0.0–0.2)

## 2022-03-17 LAB — BASIC METABOLIC PANEL
Anion gap: 8 (ref 5–15)
BUN: 26 mg/dL — ABNORMAL HIGH (ref 6–20)
CO2: 26 mmol/L (ref 22–32)
Calcium: 8.5 mg/dL — ABNORMAL LOW (ref 8.9–10.3)
Chloride: 106 mmol/L (ref 98–111)
Creatinine, Ser: 0.75 mg/dL (ref 0.61–1.24)
GFR, Estimated: >60 mL/min (ref >60)
Glucose, Bld: 124 mg/dL — ABNORMAL HIGH (ref 70–99)
Potassium: 4 mmol/L (ref 3.5–5.1)
Sodium: 140 mmol/L (ref 135–145)

## 2022-03-17 LAB — GLUCOSE, CAPILLARY
Glucose-Capillary: 117 mg/dL — ABNORMAL HIGH (ref 70–99)
Glucose-Capillary: 128 mg/dL — ABNORMAL HIGH (ref 70–99)
Glucose-Capillary: 110 mg/dL — ABNORMAL HIGH (ref 70–99)
Glucose-Capillary: 103 mg/dL — ABNORMAL HIGH (ref 70–99)
Glucose-Capillary: 123 mg/dL — ABNORMAL HIGH (ref 70–99)
Glucose-Capillary: 108 mg/dL — ABNORMAL HIGH (ref 70–99)

## 2022-03-17 MED ORDER — SENNA 8.6 MG PO TABS
2.0000 | ORAL_TABLET | Freq: Every day | ORAL | Status: DC
  Administered 2022-03-17 – 2022-03-18 (×2): 17.2 mg
  Filled 2022-03-17 (×2): qty 2

## 2022-03-17 NOTE — Progress Notes (Signed)
Patient's trach and PEG consent form signed by patient's POA - mother - and is at bedside ready for physician signature. All questions answered.  ? ?Montez Hageman, RN ?
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
## 2022-03-17 NOTE — Care Management (Signed)
Incapacity letter received from [employer] to assist with Medicaid and disability applications.  Letter signed by MD and returned to [employer], per request.   ? ?Reinaldo Raddle, RN, BSN  ?Trauma/Neuro ICU Case Manager ?[phone] ? ?
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
## 2022-03-17 NOTE — Progress Notes (Signed)
? ?Trauma/Critical Care Follow Up Note ? ?Subjective:  ?  ?Overnight Issues:  ? ?Objective:  ?Vital signs for last 24 hours: ?Temp:  [99.5 ?F (37.5 ?C)-103.2 ?F (39.6 ?C)] 100.6 ?F (38.1 ?C) (03/28 0400) ?Pulse Rate:  [69-89] 70 (03/28 0736) ?Resp:  [16-24] 19 (03/28 0736) ?BP: (98-135)/(50-72) 106/62 (03/28 0736) ?SpO2:  [91 %-98 %] 95 % (03/28 0736) ?FiO2 (%):  [40 %-50 %] 40 % (03/28 0736) ?Weight:  [125.2 kg] 125.2 kg (03/28 0500) ? ?Hemodynamic parameters for last 24 hours: ?  ? ?Intake/Output from previous day: ?03/27 0701 - 03/28 0700 ?In: 2869.8 [I.V.:995.1; NG/GT:1774.7; IV Piggyback:100] ?Out: 1190 [Urine:1190]  ?Intake/Output this shift: ?No intake/output data recorded. ? ?Vent settings for last 24 hours: ?Vent Mode: PSV;CPAP ?FiO2 (%):  [40 %-50 %] 40 % ?Set Rate:  [20 bmp] 20 bmp ?Vt Set:  [710 mL] 710 mL ?PEEP:  [5 cmH20] 5 cmH20 ?Pressure Support:  [10 cmH20] 10 cmH20 ?Plateau Pressure:  [15 cmH20-29 cmH20] 22 cmH20 ? ?Physical Exam:  ?Gen: comfortable, no distress ?Neuro: not interactive ?HEENT: PERRL ?Neck: supple ?CV: RRR ?Pulm: unlabored breathing ?Abd: soft, NT ?GU: clear yellow urine ?Extr: wwp, no edema ? ? ?Results for orders placed or performed during the hospital encounter of 03/10/22 (from the past 24 hour(s))  ?Glucose, capillary     Status: Abnormal  ? Collection Time: 03/16/22 11:43 AM  ?Result Value Ref Range  ? Glucose-Capillary 120 (H) 70 - 99 mg/dL  ?Glucose, capillary     Status: None  ? Collection Time: 03/16/22  3:40 PM  ?Result Value Ref Range  ? Glucose-Capillary 99 70 - 99 mg/dL  ?Glucose, capillary     Status: Abnormal  ? Collection Time: 03/16/22  7:21 PM  ?Result Value Ref Range  ? Glucose-Capillary 130 (H) 70 - 99 mg/dL  ?Glucose, capillary     Status: Abnormal  ? Collection Time: 03/16/22 11:13 PM  ?Result Value Ref Range  ? Glucose-Capillary 134 (H) 70 - 99 mg/dL  ?Glucose, capillary     Status: Abnormal  ? Collection Time: 03/17/22  3:32 AM  ?Result Value Ref Range  ?  Glucose-Capillary 117 (H) 70 - 99 mg/dL  ?CBC     Status: Abnormal  ? Collection Time: 03/17/22  4:33 AM  ?Result Value Ref Range  ? WBC 11.7 (H) 4.0 - 10.5 K/uL  ? RBC 3.37 (L) 4.22 - 5.81 MIL/uL  ? Hemoglobin 10.6 (L) 13.0 - 17.0 g/dL  ? HCT 31.7 (L) 39.0 - 52.0 %  ? MCV 94.1 80.0 - 100.0 fL  ? MCH 31.5 26.0 - 34.0 pg  ? MCHC 33.4 30.0 - 36.0 g/dL  ? RDW 12.1 11.5 - 15.5 %  ? Platelets 186 150 - 400 K/uL  ? nRBC 0.0 0.0 - 0.2 %  ?Basic metabolic panel     Status: Abnormal  ? Collection Time: 03/17/22  4:33 AM  ?Result Value Ref Range  ? Sodium 140 135 - 145 mmol/L  ? Potassium 4.0 3.5 - 5.1 mmol/L  ? Chloride 106 98 - 111 mmol/L  ? CO2 26 22 - 32 mmol/L  ? Glucose, Bld 124 (H) 70 - 99 mg/dL  ? BUN 26 (H) 6 - 20 mg/dL  ? Creatinine, Ser 0.75 0.61 - 1.24 mg/dL  ? Calcium 8.5 (L) 8.9 - 10.3 mg/dL  ? GFR, Estimated >60 >60 mL/min  ? Anion gap 8 5 - 15  ?Glucose, capillary     Status: Abnormal  ? Collection Time:  03/17/22  7:50 AM  ?Result Value Ref Range  ? Glucose-Capillary 128 (H) 70 - 99 mg/dL  ? ? ?Assessment & Plan: ?The plan of care was discussed with the bedside nurse for the day, Rayfield Citizen, who is in agreement with this plan and no additional concerns were raised.  ? ?Present on Admission: ? TBI (traumatic brain injury) ? ? ? LOS: 7 days  ? ?Additional comments:I reviewed the patient's new clinical lab test results.   and I reviewed the patients new imaging test results.   ? ?[facility] ?  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 Jordan Likes, keppra x7d for sz ppx, TBI therapies once extubated. Repeat CT H 3/23 stable. MRI brain 3/27 with microhemorrhages in cortex, corpaus callosum, basal ganglia, midbrain, pons.  ?C1 TP fx, occipital condyle fx - per Dr. Jordan Likes, collar ?Acute ventilator dependent respiratory failure - weaning on 10/5 ?ID - suspected PNA, resp CX negative, Maxipime empiric dc'd ?R ear laceration - S/P repair, s/p Clinda x 5d per Dr. Elijah Birk ?Acute urinary retention - replaced foley 3/25, urecholine,  ToV today or tomorrow ?Hyperglycemia - likely from TF, SSI ?FEN - NPO, TF, hold at [facility] ?DVT - SCDs, LMWH ?Dispo - ICU, trach/PEG, palliative c/s pending ? ?Critical Care Total Time: 35 minutes ? ?Diamantina Monks, MD ?Trauma & General Surgery ?Please use [email or web address] to contact on call provider ? ?03/17/2022 ? ?*Care during the described time interval was provided by me. I have reviewed this patient's available data, including medical history, events of note, physical examination and test results as part of my evaluation. ? ? ? ?

## 2022-03-18 ENCOUNTER — Encounter (HOSPITAL_COMMUNITY): Admission: EM | Disposition: A | Discharge status: Rehabilitation Facility | Payer: Self-pay | Source: Home / Self Care

## 2022-03-18 ENCOUNTER — Other Ambulatory Visit: Payer: Self-pay

## 2022-03-18 ENCOUNTER — Inpatient Hospital Stay (HOSPITAL_COMMUNITY): Payer: Medicaid Other

## 2022-03-18 ENCOUNTER — Inpatient Hospital Stay (HOSPITAL_COMMUNITY): Payer: Medicaid Other | Admitting: Certified Registered Nurse Anesthetist

## 2022-03-18 DIAGNOSIS — R0902 Hypoxemia: Secondary | ICD-10-CM

## 2022-03-18 DIAGNOSIS — S069X0A Unspecified intracranial injury without loss of consciousness, initial encounter: Secondary | ICD-10-CM

## 2022-03-18 HISTORY — PX: ESOPHAGOGASTRODUODENOSCOPY ENDOSCOPY: SHX5814

## 2022-03-18 HISTORY — PX: PEG PLACEMENT: SHX5437

## 2022-03-18 HISTORY — PX: TRACHEOSTOMY TUBE PLACEMENT: SHX814

## 2022-03-18 LAB — BASIC METABOLIC PANEL
Anion gap: 9 (ref 5–15)
BUN: 21 mg/dL — ABNORMAL HIGH (ref 6–20)
CO2: 24 mmol/L (ref 22–32)
Calcium: 8.6 mg/dL — ABNORMAL LOW (ref 8.9–10.3)
Chloride: 105 mmol/L (ref 98–111)
Creatinine, Ser: 0.73 mg/dL (ref 0.61–1.24)
GFR, Estimated: >60 mL/min (ref >60)
Glucose, Bld: 119 mg/dL — ABNORMAL HIGH (ref 70–99)
Potassium: 4 mmol/L (ref 3.5–5.1)
Sodium: 138 mmol/L (ref 135–145)

## 2022-03-18 LAB — GLUCOSE, CAPILLARY
Glucose-Capillary: 118 mg/dL — ABNORMAL HIGH (ref 70–99)
Glucose-Capillary: 102 mg/dL — ABNORMAL HIGH (ref 70–99)
Glucose-Capillary: 125 mg/dL — ABNORMAL HIGH (ref 70–99)
Glucose-Capillary: 105 mg/dL — ABNORMAL HIGH (ref 70–99)
Glucose-Capillary: 119 mg/dL — ABNORMAL HIGH (ref 70–99)
Glucose-Capillary: 125 mg/dL — ABNORMAL HIGH (ref 70–99)

## 2022-03-18 LAB — CBC
HCT: 32.4 % — ABNORMAL LOW (ref 39.0–52.0)
Hemoglobin: 11.1 g/dL — ABNORMAL LOW (ref 13.0–17.0)
MCH: 32 pg (ref 26.0–34.0)
MCHC: 34.3 g/dL (ref 30.0–36.0)
MCV: 93.4 fL (ref 80.0–100.0)
Platelets: 243 10*3/uL (ref 150–400)
RBC: 3.47 MIL/uL — ABNORMAL LOW (ref 4.22–5.81)
RDW: 12.1 % (ref 11.5–15.5)
WBC: 13.8 10*3/uL — ABNORMAL HIGH (ref 4.0–10.5)
nRBC: 0 % (ref 0.0–0.2)

## 2022-03-18 SURGERY — CREATION, TRACHEOSTOMY
Anesthesia: General | Site: Throat

## 2022-03-18 MED ORDER — ROCURONIUM BROMIDE 10 MG/ML (PF) SYRINGE
PREFILLED_SYRINGE | INTRAVENOUS | Status: AC
  Filled 2022-03-18: qty 10

## 2022-03-18 MED ORDER — LIDOCAINE HCL (PF) 1.5 % IJ SOLN
INTRAMUSCULAR | Status: DC | PRN
  Administered 2022-03-18: 5 mL via INTRADERMAL

## 2022-03-18 MED ORDER — LACTATED RINGERS IV SOLN: INTRAVENOUS | Status: DC | PRN

## 2022-03-18 MED ORDER — FENTANYL BOLUS VIA INFUSION
50.0000 ug | INTRAVENOUS | Status: DC | PRN
  Administered 2022-03-19: 100 ug via INTRAVENOUS
  Administered 2022-03-19: 50 ug via INTRAVENOUS
  Filled 2022-03-18: qty 100

## 2022-03-18 MED ORDER — ROCURONIUM BROMIDE 50 MG/5ML IV SOLN: 10.0000 mg | Freq: Once | INTRAVENOUS | Status: DC

## 2022-03-18 MED ORDER — FENTANYL 2500MCG IN NS 250ML (10MCG/ML) PREMIX INFUSION
50.0000 ug/h | INTRAVENOUS | Status: DC
  Administered 2022-03-18: 50 ug/h via INTRAVENOUS

## 2022-03-18 MED ORDER — FENTANYL 2500MCG IN NS 250ML (10MCG/ML) PREMIX INFUSION
INTRAVENOUS | Status: AC
  Filled 2022-03-18: qty 250

## 2022-03-18 MED ORDER — ROCURONIUM BROMIDE 50 MG/5ML IV SOLN
100.0000 mg | Freq: Once | INTRAVENOUS | Status: AC
  Administered 2022-03-18: 100 mg via INTRAVENOUS

## 2022-03-18 MED ORDER — PROPOFOL 10 MG/ML IV BOLUS
INTRAVENOUS | Status: AC
  Filled 2022-03-18: qty 20

## 2022-03-18 MED ORDER — LIDOCAINE HCL (PF) 1 % IJ SOLN: INTRAMUSCULAR | Status: DC | PRN

## 2022-03-18 MED ORDER — ROCURONIUM BROMIDE 10 MG/ML (PF) SYRINGE
PREFILLED_SYRINGE | INTRAVENOUS | Status: DC | PRN
  Administered 2022-03-18: 60 mg via INTRAVENOUS
  Administered 2022-03-18: 100 mg via INTRAVENOUS
  Administered 2022-03-18: 50 mg via INTRAVENOUS
  Administered 2022-03-18: 40 mg via INTRAVENOUS
  Administered 2022-03-18: 50 mg via INTRAVENOUS

## 2022-03-18 MED ORDER — 0.9 % SODIUM CHLORIDE (POUR BTL) OPTIME
TOPICAL | Status: DC | PRN
  Administered 2022-03-18: 1000 mL

## 2022-03-18 SURGICAL SUPPLY — 52 items
ADH SKN CLS APL DERMABOND .7 (GAUZE/BANDAGES/DRESSINGS) ×2
BAG COUNTER SPONGE SURGICOUNT (BAG) ×3 IMPLANT
BAG SPNG CNTER NS LX DISP (BAG) ×2
BLOCK BITE 60FR ADLT L/F BLUE (MISCELLANEOUS) ×3 IMPLANT
BUTTON OLYMPUS DEFENDO 5 PIECE (MISCELLANEOUS) ×3 IMPLANT
CANISTER SUCT 3000ML PPV (MISCELLANEOUS) ×3 IMPLANT
COVER SURGICAL LIGHT HANDLE (MISCELLANEOUS) ×3 IMPLANT
DERMABOND ADVANCED (GAUZE/BANDAGES/DRESSINGS) ×1
DERMABOND ADVANCED .7 DNX12 (GAUZE/BANDAGES/DRESSINGS) IMPLANT
DRAPE LAPAROSCOPIC ABDOMINAL (DRAPES) ×1 IMPLANT
DRAPE UTILITY XL STRL (DRAPES) ×3 IMPLANT
DRSG TEGADERM 4X4.75 (GAUZE/BANDAGES/DRESSINGS) ×3 IMPLANT
DRSG TELFA 3X8 NADH (GAUZE/BANDAGES/DRESSINGS) ×3 IMPLANT
ELECT CAUTERY BLADE 6.4 (BLADE) ×3 IMPLANT
ELECT REM PT RETURN 9FT ADLT (ELECTROSURGICAL) ×3
ELECTRODE REM PT RTRN 9FT ADLT (ELECTROSURGICAL) ×2 IMPLANT
GAUZE 4X4 16PLY ~~LOC~~+RFID DBL (SPONGE) ×3 IMPLANT
GLOVE SURG ENC MOIS LTX SZ6.5 (GLOVE) ×3 IMPLANT
GLOVE SURG UNDER POLY LF SZ6 (GLOVE) ×3 IMPLANT
GOWN STRL REUS W/ TWL LRG LVL3 (GOWN DISPOSABLE) ×4 IMPLANT
GOWN STRL REUS W/TWL LRG LVL3 (GOWN DISPOSABLE) ×6
INTRODUCER TRACH BLUE RHINO 6F (TUBING) IMPLANT
INTRODUCER TRACH BLUE RHINO 8F (TUBING) IMPLANT
KIT BASIN OR (CUSTOM PROCEDURE TRAY) ×3 IMPLANT
KIT CLEAN ENDO COMPLIANCE (KITS) ×3 IMPLANT
KIT TURNOVER KIT B (KITS) ×3 IMPLANT
NDL SPNL 18GX3.5 QUINCKE PK (NEEDLE) IMPLANT
NEEDLE SPNL 18GX3.5 QUINCKE PK (NEEDLE) ×3 IMPLANT
NS IRRIG 1000ML POUR BTL (IV SOLUTION) ×3 IMPLANT
PACK EENT II TURBAN DRAPE (CUSTOM PROCEDURE TRAY) ×3 IMPLANT
PAD ARMBOARD 7.5X6 YLW CONV (MISCELLANEOUS) ×6 IMPLANT
PAD DRESSING TELFA 3X8 NADH (GAUZE/BANDAGES/DRESSINGS) ×2 IMPLANT
PENCIL BUTTON HOLSTER BLD 10FT (ELECTRODE) ×3 IMPLANT
SET TUBE SMOKE EVAC HIGH FLOW (TUBING) ×1 IMPLANT
SOL ANTI FOG 6CC (MISCELLANEOUS) IMPLANT
SOLUTION ANTI FOG 6CC (MISCELLANEOUS) ×3
SPONGE INTESTINAL PEANUT (DISPOSABLE) ×3 IMPLANT
SUT MNCRL AB 4-0 PS2 18 (SUTURE) ×1 IMPLANT
SUT VIC AB 2-0 SH 27 (SUTURE) ×3
SUT VIC AB 2-0 SH 27XBRD (SUTURE) IMPLANT
SUT VICRYL 0 UR6 27IN ABS (SUTURE) ×3 IMPLANT
SUT VICRYL AB 3 0 TIES (SUTURE) ×3 IMPLANT
SYR 20ML LL LF (SYRINGE) ×3 IMPLANT
TOWEL GREEN STERILE (TOWEL DISPOSABLE) ×3 IMPLANT
TOWEL GREEN STERILE FF (TOWEL DISPOSABLE) ×3 IMPLANT
TRAY W/TRACH TUBE 7.5 (MISCELLANEOUS) ×1 IMPLANT
TROCAR XCEL BLADELESS 5X75MML (TROCAR) ×1 IMPLANT
TROCAR XCEL BLUNT TIP 100MML (ENDOMECHANICALS) ×1 IMPLANT
TUBE CONNECTING 12X1/4 (SUCTIONS) ×3 IMPLANT
TUBE ENDOVIVE SAFETY PEG 24 (TUBING) ×3 IMPLANT
TUBING ENDO SMARTCAP (MISCELLANEOUS) ×3 IMPLANT
WATER STERILE IRR 1000ML POUR (IV SOLUTION) ×3 IMPLANT

## 2022-03-18 NOTE — Progress Notes (Signed)
No significant change in status.  Patient remains intubated and sedated.  Pupils remain pinpoint.  Some intermittent flexion with both upper extremities and getting to flex his lower extremities more.  Still no true purposeful movements. ? 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  Patient's injury is certainly survivable and he very well may significantly improve over the next few months.  In the interim he will require significant supportive efforts. ?

## 2022-03-18 NOTE — Plan of Care (Signed)
  Problem: Education: Goal: Knowledge of General Education information will improve Description: Including pain rating scale, medication(s)/side effects and non-pharmacologic comfort measures Outcome: Progressing   Problem: Health Behavior/Discharge Planning: Goal: Ability to manage health-related needs will improve Outcome: Progressing   Problem: Clinical Measurements: Goal: Ability to maintain clinical measurements within normal limits will improve Outcome: Progressing   Problem: Activity: Goal: Risk for activity intolerance will decrease Outcome: Progressing   Problem: Nutrition: Goal: Adequate nutrition will be maintained Outcome: Progressing   Problem: Elimination: Goal: Will not experience complications related to bowel motility Outcome: Progressing Goal: Will not experience complications related to urinary retention Outcome: Progressing   Problem: Pain Managment: Goal: General experience of comfort will improve Outcome: Progressing   

## 2022-03-18 NOTE — Progress Notes (Signed)
? ?Trauma/Critical Care Follow Up Note ? ?Subjective:  ?  ?Overnight Issues:  ? ?Objective:  ?Vital signs for last 24 hours: ?Temp:  [99.3 ?F (37.4 ?C)-101.2 ?F (38.4 ?C)] 100.4 ?F (38 ?C) (03/29 0400) ?Pulse Rate:  [66-100] 80 (03/29 0600) ?Resp:  [17-30] 22 (03/29 0600) ?BP: (107-155)/(58-80) 107/58 (03/29 0734) ?SpO2:  [91 %-100 %] 95 % (03/29 0600) ?FiO2 (%):  [40 %] 40 % (03/29 0734) ?Weight:  [126.5 kg] 126.5 kg (03/29 0347) ? ?Hemodynamic parameters for last 24 hours: ?  ? ?Intake/Output from previous day: ?03/28 0701 - 03/29 0700 ?In: 2056.9 [I.V.:801.6; NG/GT:1225.3] ?Out: 2150 [Urine:1750; Stool:400]  ?Intake/Output this shift: ?No intake/output data recorded. ? ?Vent settings for last 24 hours: ?Vent Mode: PRVC ?FiO2 (%):  [40 %] 40 % ?Set Rate:  [20 bmp] 20 bmp ?Vt Set:  [710 mL] 710 mL ?PEEP:  [5 cmH20] 5 cmH20 ?Pressure Support:  [5 cmH20-8 cmH20] 5 cmH20 ?Plateau Pressure:  [17 cmH20] 17 cmH20 ? ?Physical Exam:  ?Gen: comfortable, no distress ?Neuro: non-focal exam ?HEENT: PERRL ?Neck: supple ?CV: RRR ?Pulm: unlabored breathing ?Abd: soft, NT ?GU: clear yellow urine ?Extr: wwp, no edema ? ? ?Results for orders placed or performed during the hospital encounter of 03/10/22 (from the past 24 hour(s))  ?Glucose, capillary     Status: Abnormal  ? Collection Time: 03/17/22 11:42 AM  ?Result Value Ref Range  ? Glucose-Capillary 110 (H) 70 - 99 mg/dL  ?Glucose, capillary     Status: Abnormal  ? Collection Time: 03/17/22  3:52 PM  ?Result Value Ref Range  ? Glucose-Capillary 103 (H) 70 - 99 mg/dL  ?Glucose, capillary     Status: Abnormal  ? Collection Time: 03/17/22  7:22 PM  ?Result Value Ref Range  ? Glucose-Capillary 123 (H) 70 - 99 mg/dL  ?Glucose, capillary     Status: Abnormal  ? Collection Time: 03/17/22 11:14 PM  ?Result Value Ref Range  ? Glucose-Capillary 108 (H) 70 - 99 mg/dL  ?CBC     Status: Abnormal  ? Collection Time: 03/18/22  1:07 AM  ?Result Value Ref Range  ? WBC 13.8 (H) 4.0 - 10.5 K/uL  ?  RBC 3.47 (L) 4.22 - 5.81 MIL/uL  ? Hemoglobin 11.1 (L) 13.0 - 17.0 g/dL  ? HCT 32.4 (L) 39.0 - 52.0 %  ? MCV 93.4 80.0 - 100.0 fL  ? MCH 32.0 26.0 - 34.0 pg  ? MCHC 34.3 30.0 - 36.0 g/dL  ? RDW 12.1 11.5 - 15.5 %  ? Platelets 243 150 - 400 K/uL  ? nRBC 0.0 0.0 - 0.2 %  ?Basic metabolic panel     Status: Abnormal  ? Collection Time: 03/18/22  1:07 AM  ?Result Value Ref Range  ? Sodium 138 135 - 145 mmol/L  ? Potassium 4.0 3.5 - 5.1 mmol/L  ? Chloride 105 98 - 111 mmol/L  ? CO2 24 22 - 32 mmol/L  ? Glucose, Bld 119 (H) 70 - 99 mg/dL  ? BUN 21 (H) 6 - 20 mg/dL  ? Creatinine, Ser 0.73 0.61 - 1.24 mg/dL  ? Calcium 8.6 (L) 8.9 - 10.3 mg/dL  ? GFR, Estimated >60 >60 mL/min  ? Anion gap 9 5 - 15  ?Glucose, capillary     Status: Abnormal  ? Collection Time: 03/18/22  3:21 AM  ?Result Value Ref Range  ? Glucose-Capillary 118 (H) 70 - 99 mg/dL  ?Glucose, capillary     Status: Abnormal  ? Collection Time: 03/18/22  7:44 AM  ?Result Value Ref Range  ? Glucose-Capillary 102 (H) 70 - 99 mg/dL  ? ? ?Assessment & Plan: ? ?Present on Admission: ? TBI (traumatic brain injury) ? ? ? LOS: 8 days  ? ?Additional comments:I reviewed the patient's new clinical lab test results.   and I reviewed the patients new imaging test results.   ? ?[facility] ?  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 Jordan Likes, keppra x7d for sz ppx, TBI therapies once extubated. Repeat CT H 3/23 stable. MRI brain 3/27 with microhemorrhages in cortex, corpaus callosum, basal ganglia, midbrain, pons.  ?C1 TP fx, occipital condyle fx - per Dr. Jordan Likes, collar ?Acute ventilator dependent respiratory failure - weaning on 10/5 ?ID - suspected PNA, resp CX negative, Maxipime empiric dc'd ?R ear laceration - S/P repair, s/p Clinda x 5d per Dr. Elijah Birk ?Acute urinary retention - replaced foley 3/25, urecholine, ToV Hyperglycemia - likely from TF, SSI ?FEN - NPO, TF, hold at [facility] ?DVT - SCDs, LMWH ?Dispo - ICU, trach/PEG today palliative following ? ?Critical Care Total Time: 35  minutes ? ?Diamantina Monks, MD ?Trauma & General Surgery ?Please use [email or web address] to contact on call provider ? ?03/18/2022 ? ?*Care during the described time interval was provided by me. I have reviewed this patient's available data, including medical history, events of note, physical examination and test results as part of my evaluation. ? ? ? ?

## 2022-03-18 NOTE — Progress Notes (Signed)
SLP Cancellation Note ? ?Patient Details ?Name: Carl Carpenter ?MRN: [id number] ?DOB: 1986-05-18 ? ? ?Cancelled treatment:       Reason Eval/Treat Not Completed: Patient not medically ready.  Pt  had trach placed today. SLP will f/u tomorrow for readiness for assessment.  ? ? ?Carl Carpenter, Carl Carpenter ?03/18/2022, 11:41 AM ?

## 2022-03-18 NOTE — Progress Notes (Signed)
PT Cancellation Note ? ?Patient Details ?Name: Carl Carpenter ?MRN: [id number] ?DOB: 06/02/1986 ? ? ?Cancelled Treatment:    Reason Eval/Treat Not Completed: Patient not medically ready; patient just had trach placed today.  Will follow up another day for readiness for PT. ? ? ?[name] ?03/18/2022, 2:15 PM ?Sheran Lawless, PT ?Acute Rehabilitation Services ?Pager:[phone] ?Office:[phone] ?03/18/2022 ? ?
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
## 2022-03-18 NOTE — Op Note (Signed)
? ?Operative Note ? ?Date: 03/18/2022 ? ?Procedure: percutaneous tracheostomy without bronchoscopic assistance, esophagogastroduodenoscopy (EGD) and laparoscopic assisted percutaneous endoscopic gastrostomy (PEG) tube placement ? ?Pre-op diagnosis: traumatic brain injury ?Post-op diagnosis: same ? ?Indication and clinical history: The patient  is a 36 y.o. year old male with a traumatic brain injury ? ?Surgeon: Diamantina Monks, MD ?Assistant: Janee Morn, MD; [place], [place]  ? ?Anesthesia: general ? ?Findings:  ?Specimen: none ?EBL: <5cc  ?Drains/Implants: #6 Shiley cuffed  tracheostomy tube, PEG tube, 3cm at the skin  ? ?Disposition: ICU ? ?Description of procedure: The patient was positioned supine with a shoulder roll. The placement of a shoulder roll was authorized by Dr. Jordan Likes. Time-out was performed verifying correct patient, procedure, signature of informed consent, and pre-operative antibiotics as indicated. General anesthetic induction was uneventful and the patient was confirmed to be on 100% FiO2. The neck was prepped and draped in the usual sterile fashion. Palpation of neck anatomy was performed. A longitudinal incision was made in the neck and deepened down to the trachea.  ? ?The pilot balloon was deflated and the endotracheal tube slowly retracted proximally until the tip of the endotracheal tube was palpated just below the cricoid cartilage. An introducer needle was inserted between the second and third tracheal rings. A guidewire was passed through the introducer needle and the needle removed. Serial dilation was performed and a #6 distal XLT cuffed Shiley and stylet were inserted over the guidewire and the guidewire and stylet removed. The inner cannula was inserted, the pilot balloon on the tracheostomy inflated, and the ventilator tubing disconnected from the endotracheal tube and connected to the tracheostomy. Chest rise, appropriate end-tidal CO2, and return tidal volumes were confirmed. The  tracheostomy tube was sutured in four quadrants and a tracheostomy tie applied to the neck. The endotracheal tube was removed. The patient tolerated the procedure well. There were no complications. Post-procedure chest x-ray was ordered to confirm tube position and the absence of a pneumothorax. ? ?The patient was re-positioned to reverse Trendelenburg. A bite block was placed into the oropharynx. The endoscope was inserted into the oropharynx and advanced down the esophagus into the stomach and into the duodenum. The visualized esophagus and duodenum were unremarkable. The endoscope was retracted back into the stomach and the stomach was insufflated. The stomach was inspected and was also normal. Transillumination was performed. The light was visible on the external skin and dimpling of the stomach was noted endoscopically with manual pressure. The abdomen was prepped and draped in the usual sterile fashion. Transillumination and dimpling were repeated. Dimpling of the stomach was not visualized during local anesthetic infiltration nor during introducer needle insertion, so the procedure was converted to a laparoscopic assisted PEG tube placement. Roseanne Reno entry was performed via infraumbilical incision and the stomach visualized partially adhesed in the left upper quadrant. An additional 52mm port  was placed in the left abdomen to be able to retract the stomach caudad. The introducer needle and sheath were then inserted under direct visualization into the stomach. The needle was removed and guidewire inserted. The guidewire was grasped by an endoscopic snare and the snare, guidewire, and endoscope retracted out of the oropharynx. The PEG tube was secured to the guidewire and retracted through the mouth and esophagus into the stomach. The PEG tube was secured with a bolster and was visualized endoscopically to spin freely circumferentially and also be without gaps between the internal bumper and the stomach wall.  There was no evidence of bleeding. The  PEG bolster was secured at 3cm at the skin and there were no gaps between the bolster and the abdominal wall. The stomach was desufflated endoscopically and the endoscope removed. The bite block was also removed. The patient tolerated the procedure well and there were no complications.  ? ?The patient may have water and medications administered via the PEG tube beginning immediately and tube feeds may be initiated four hours post-procedure.  ? ? ?Diamantina Monks, MD ?General and Trauma Surgery ?[facility] ? ? ?

## 2022-03-18 NOTE — Progress Notes (Signed)
This chaplain responded to PMT consult for ongoing spiritual care. The Pt. is out of the room at the time of the visit. Family is not present.  ? ?The chaplain will re-visit and continue updates from the PMT. ? ?Chaplain Sallyanne Kuster ?[phone] ?

## 2022-03-18 NOTE — Transfer of Care (Signed)
Immediate Anesthesia Transfer of Care Note ? ?Patient: Carl Carpenter ? ?Procedure(s) Performed: TRACHEOSTOMY (Throat) ?LAPAROSCOPIC ASSITED PERCUTANEOUS GASTROSTOMY (PEG) PLACEMENT (Abdomen) ?ESOPHAGOGASTRODUODENOSCOPY ENDOSCOPY (Throat) ? ?Patient Location: ICU ? ?Anesthesia Type:General ? ?Level of Consciousness: sedated ? ?Airway & Oxygen Therapy: Patient placed on Ventilator (see vital sign flow sheet for setting) ? ?Post-op Assessment: Report given to RN and Post -op Vital signs reviewed and stable ? ?Post vital signs: Reviewed and stable ? ?Last Vitals:  ?Vitals Value Taken Time  ?BP 117/84   ?Temp    ?Pulse 81 03/18/22 1122  ?Resp 20 03/18/22 1122  ?SpO2 90 % 03/18/22 1122  ?Vitals shown include unvalidated device data. ? ?Last Pain:  ?Vitals:  ? 03/18/22 0800  ?TempSrc: Axillary  ?PainSc:   ?   ? ?  ? ?Complications: No notable events documented. ?

## 2022-03-19 ENCOUNTER — Encounter (HOSPITAL_COMMUNITY): Payer: Self-pay | Admitting: Surgery

## 2022-03-19 LAB — GLUCOSE, CAPILLARY
Glucose-Capillary: 111 mg/dL — ABNORMAL HIGH (ref 70–99)
Glucose-Capillary: 124 mg/dL — ABNORMAL HIGH (ref 70–99)
Glucose-Capillary: 138 mg/dL — ABNORMAL HIGH (ref 70–99)
Glucose-Capillary: 127 mg/dL — ABNORMAL HIGH (ref 70–99)
Glucose-Capillary: 114 mg/dL — ABNORMAL HIGH (ref 70–99)
Glucose-Capillary: 110 mg/dL — ABNORMAL HIGH (ref 70–99)

## 2022-03-19 MED ORDER — SENNA 8.6 MG PO TABS: 2.0000 | ORAL_TABLET | Freq: Every day | ORAL | Status: DC | PRN

## 2022-03-19 MED ORDER — MUPIROCIN CALCIUM 2 % EX CREA
TOPICAL_CREAM | Freq: Every day | CUTANEOUS | Status: DC
  Administered 2022-03-27 – 2022-04-22 (×2): 1 via TOPICAL
  Filled 2022-03-19 (×4): qty 15

## 2022-03-19 NOTE — Progress Notes (Addendum)
? ?Trauma/Critical Care Follow Up Note ? ?Subjective:  ?  ?Overnight Issues:  ? ?Objective:  ?Vital signs for last 24 hours: ?Temp:  [98.8 ?F (37.1 ?C)-101.2 ?F (38.4 ?C)] 99.8 ?F (37.7 ?C) (03/30 0800) ?Pulse Rate:  [70-111] 90 (03/30 1200) ?Resp:  [20-30] 24 (03/30 1200) ?BP: (106-166)/(62-91) 115/69 (03/30 1200) ?SpO2:  [88 %-99 %] 96 % (03/30 1200) ?FiO2 (%):  [40 %-50 %] 40 % (03/30 1107) ?Weight:  [125.8 kg] 125.8 kg (03/30 0300) ? ?Hemodynamic parameters for last 24 hours: ?  ? ?Intake/Output from previous day: ?03/29 0701 - 03/30 0700 ?In: 2305.7 [I.V.:1262.3; NG/GT:1013.3] ?Out: 2165 [Urine:2105; Stool:50; Blood:10]  ?Intake/Output this shift: ?Total I/O ?In: 147.6 [I.V.:147.6] ?Out: 500 [Urine:500] ? ?Vent settings for last 24 hours: ?Vent Mode: PSV;CPAP ?FiO2 (%):  [40 %-50 %] 40 % ?Set Rate:  [20 bmp] 20 bmp ?Vt Set:  [710 mL] 710 mL ?PEEP:  [8 cmH20] 8 cmH20 ?Pressure Support:  [8 cmH20] 8 cmH20 ?Plateau Pressure:  [18 cmH20-23 cmH20] 18 cmH20 ? ?Physical Exam:  ?Gen: comfortable, no distress ?Neuro: non-focal exam ?HEENT: PERRL ?Neck: c-collar, trach ?CV: RRR ?Pulm: unlabored breathing ?Abd: soft, NT, PEG, incisions c/d/i ?GU: clear yellow urine ?Extr: wwp, no edema ? ? ?Results for orders placed or performed during the hospital encounter of 03/10/22 (from the past 24 hour(s))  ?Culture, Respiratory w Gram Stain     Status: None (Preliminary result)  ? Collection Time: 03/18/22 12:40 PM  ? Specimen: Bronchoalveolar Lavage; Respiratory  ?Result Value Ref Range  ? Specimen Description BRONCHIAL ALVEOLAR LAVAGE   ? Special Requests Normal   ? Gram Stain    ?  NO WBC SEEN ?NO ORGANISMS SEEN ?Performed at [facility], [place] [address] [place], [place] [address] ?  ? Culture PENDING   ? Report Status PENDING   ?Glucose, capillary     Status: Abnormal  ? Collection Time: 03/18/22 12:56 PM  ?Result Value Ref Range  ? Glucose-Capillary 125 (H) 70 - 99 mg/dL  ?Glucose, capillary     Status: Abnormal  ?  Collection Time: 03/18/22  3:41 PM  ?Result Value Ref Range  ? Glucose-Capillary 105 (H) 70 - 99 mg/dL  ?Glucose, capillary     Status: Abnormal  ? Collection Time: 03/18/22  7:27 PM  ?Result Value Ref Range  ? Glucose-Capillary 119 (H) 70 - 99 mg/dL  ?Glucose, capillary     Status: Abnormal  ? Collection Time: 03/18/22 11:21 PM  ?Result Value Ref Range  ? Glucose-Capillary 125 (H) 70 - 99 mg/dL  ?Glucose, capillary     Status: Abnormal  ? Collection Time: 03/19/22  3:32 AM  ?Result Value Ref Range  ? Glucose-Capillary 111 (H) 70 - 99 mg/dL  ?Glucose, capillary     Status: Abnormal  ? Collection Time: 03/19/22  7:02 AM  ?Result Value Ref Range  ? Glucose-Capillary 124 (H) 70 - 99 mg/dL  ?Glucose, capillary     Status: Abnormal  ? Collection Time: 03/19/22 11:17 AM  ?Result Value Ref Range  ? Glucose-Capillary 138 (H) 70 - 99 mg/dL  ? Comment 1 Notify RN   ? Comment 2 Document in Chart   ? ? ?Assessment & Plan: ?The plan of care was discussed with the bedside nurse for the day, Trish, who is in agreement with this plan and no additional concerns were raised.  ? ?Present on Admission: ? TBI (traumatic brain injury) ? ? ? LOS: 9 days  ? ?Additional comments:I reviewed  the patient's new clinical lab test results.   and I reviewed the patients new imaging test results.   ? ?[facility] ?  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 Annette Stable, keppra x7d for sz ppx, TBI therapies once extubated. Repeat CT H 3/23 stable. MRI brain 3/27 with microhemorrhages in cortex, corpaus callosum, basal ganglia, midbrain, pons.  ?C1 TP fx, occipital condyle fx - per Dr. Annette Stable, collar ?Acute ventilator dependent respiratory failure - weaning on 8/8, BAL 3/29 pending ?ID - suspected PNA, resp CX negative, Maxipime empiric dc'd ?R ear laceration - S/P repair, s/p Clinda x 5d per Dr. Marcelline Deist ?Acute urinary retention - foley out, voiding ?Hyperglycemia - likely from TF, SSI ?FEN - NPO, TF, SLP ?DVT - SCDs, LMWH ?Dispo - ICU, PT/OT, anticipate SNF  placement ? ?Critical Care Total Time: 35 minutes ? ?Jesusita Oka, MD ?Trauma & General Surgery ?Please use [email or web address] to contact on call provider ? ?03/19/2022 ? ?*Care during the described time interval was provided by me. I have reviewed this patient's available data, including medical history, events of note, physical examination and test results as part of my evaluation. ? ? ? ?

## 2022-03-19 NOTE — Progress Notes (Signed)
Pt w/ temp 102.3, cold therapy applied now temp 99.6. Dr. Freida Busman updated d/t not seeing a temp as high as 102.3. ?
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
## 2022-03-19 NOTE — Anesthesia Postprocedure Evaluation (Signed)
Anesthesia Post Note ? ?Patient: Keithen Capo ? ?Procedure(s) Performed: TRACHEOSTOMY (Throat) ?LAPAROSCOPIC ASSITED PERCUTANEOUS GASTROSTOMY (PEG) PLACEMENT (Abdomen) ?ESOPHAGOGASTRODUODENOSCOPY ENDOSCOPY (Throat) ? ?  ? ?Patient location during evaluation: SICU ?Anesthesia Type: General ?Level of consciousness: sedated ?Pain management: pain level controlled ?Vital Signs Assessment: post-procedure vital signs reviewed and stable ?Respiratory status: patient remains intubated per anesthesia plan ?Cardiovascular status: stable ?Postop Assessment: no apparent nausea or vomiting ?Anesthetic complications: no ? ? ?No notable events documented. ? ?Last Vitals:  ?Vitals:  ? 03/19/22 0500 03/19/22 0600  ?BP: 112/68 112/67  ?Pulse: 75 75  ?Resp: (!) 21 20  ?Temp:    ?SpO2: 95% 95%  ?  ?Last Pain:  ?Vitals:  ? 03/19/22 0400  ?TempSrc: Axillary  ?PainSc:   ? ? ?  ?  ?  ?  ?  ?  ? ?Ashley Bultema S ? ? ? ? ?

## 2022-03-19 NOTE — Plan of Care (Signed)
?  Problem: Education: ?Goal: Knowledge of General Education information will improve ?Description: Including pain rating scale, medication(s)/side effects and non-pharmacologic comfort measures ?03/19/2022 2253 by Ronalee Red, RN ?Outcome: Progressing ?03/19/2022 2101 by Ronalee Red, RN ?Outcome: Progressing ?  ?Problem: Clinical Measurements: ?Goal: Ability to maintain clinical measurements within normal limits will improve ?Outcome: Progressing ?  ?Problem: Activity: ?Goal: Risk for activity intolerance will decrease ?03/19/2022 2253 by Ronalee Red, RN ?Outcome: Progressing ?03/19/2022 2101 by Ronalee Red, RN ?Outcome: Progressing ?  ?Problem: Nutrition: ?Goal: Adequate nutrition will be maintained ?Outcome: Progressing ?  ?Problem: Pain Managment: ?Goal: General experience of comfort will improve ?Outcome: Progressing ?  ?Problem: Safety: ?Goal: Ability to remain free from injury will improve ?Outcome: Progressing ?  ?Problem: Skin Integrity: ?Goal: Risk for impaired skin integrity will decrease ?03/19/2022 2253 by Ronalee Red, RN ?Outcome: Progressing ?03/19/2022 2101 by Ronalee Red, RN ?Outcome: Progressing ?  ?
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
## 2022-03-19 NOTE — Plan of Care (Signed)
  Problem: Education: Goal: Knowledge of General Education information will improve Description: Including pain rating scale, medication(s)/side effects and non-pharmacologic comfort measures Outcome: Progressing   Problem: Health Behavior/Discharge Planning: Goal: Ability to manage health-related needs will improve Outcome: Progressing   Problem: Clinical Measurements: Goal: Ability to maintain clinical measurements within normal limits will improve Outcome: Progressing   Problem: Activity: Goal: Risk for activity intolerance will decrease Outcome: Progressing   Problem: Nutrition: Goal: Adequate nutrition will be maintained Outcome: Progressing   Problem: Pain Managment: Goal: General experience of comfort will improve Outcome: Progressing   Problem: Skin Integrity: Goal: Risk for impaired skin integrity will decrease Outcome: Progressing   

## 2022-03-19 NOTE — Plan of Care (Signed)
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
## 2022-03-20 LAB — GLUCOSE, CAPILLARY
Glucose-Capillary: 130 mg/dL — ABNORMAL HIGH (ref 70–99)
Glucose-Capillary: 136 mg/dL — ABNORMAL HIGH (ref 70–99)
Glucose-Capillary: 146 mg/dL — ABNORMAL HIGH (ref 70–99)
Glucose-Capillary: 113 mg/dL — ABNORMAL HIGH (ref 70–99)
Glucose-Capillary: 113 mg/dL — ABNORMAL HIGH (ref 70–99)
Glucose-Capillary: 119 mg/dL — ABNORMAL HIGH (ref 70–99)

## 2022-03-20 MED ORDER — AMANTADINE HCL 50 MG/5ML PO SOLN
100.0000 mg | Freq: Two times a day (BID) | ORAL | Status: DC
  Administered 2022-03-20 – 2022-03-23 (×6): 100 mg
  Filled 2022-03-20 (×7): qty 10
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
## 2022-03-20 NOTE — Progress Notes (Signed)
SLP Cancellation Note ? ?Patient Details ?Name: Carl Carpenter ?MRN: [id number] ?DOB: 21-Jul-1986 ? ? ?Cancelled treatment:        Spoke with RN and pt is not interactive at this time for an inline PMV. Will follow. ? ? ?Royce Macadamia ?03/20/2022, 8:40 AM ?

## 2022-03-21 LAB — GLUCOSE, CAPILLARY
Glucose-Capillary: 110 mg/dL — ABNORMAL HIGH (ref 70–99)
Glucose-Capillary: 123 mg/dL — ABNORMAL HIGH (ref 70–99)
Glucose-Capillary: 133 mg/dL — ABNORMAL HIGH (ref 70–99)
Glucose-Capillary: 108 mg/dL — ABNORMAL HIGH (ref 70–99)
Glucose-Capillary: 127 mg/dL — ABNORMAL HIGH (ref 70–99)
Glucose-Capillary: 125 mg/dL — ABNORMAL HIGH (ref 70–99)

## 2022-03-21 LAB — CULTURE, RESPIRATORY W GRAM STAIN
Culture: NORMAL
Gram Stain: NONE SEEN
Special Requests: NORMAL

## 2022-03-21 MED ORDER — ALBUTEROL SULFATE (2.5 MG/3ML) 0.083% IN NEBU
INHALATION_SOLUTION | RESPIRATORY_TRACT | Status: AC
  Administered 2022-03-21: 2.5 mg
  Filled 2022-03-21: qty 3

## 2022-03-21 MED ORDER — ACETYLCYSTEINE 20 % IN SOLN
600.0000 mg | RESPIRATORY_TRACT | Status: DC
  Administered 2022-03-21 – 2022-03-26 (×29): 600 mg via RESPIRATORY_TRACT
  Filled 2022-03-21 (×30): qty 4

## 2022-03-21 MED ORDER — ACETYLCYSTEINE 20 % IN SOLN
600.0000 mg | RESPIRATORY_TRACT | Status: DC
  Administered 2022-03-21: 600 mg via RESPIRATORY_TRACT
  Filled 2022-03-21: qty 4

## 2022-03-21 MED ORDER — ALBUTEROL SULFATE (2.5 MG/3ML) 0.083% IN NEBU
2.5000 mg | INHALATION_SOLUTION | RESPIRATORY_TRACT | Status: DC
  Administered 2022-03-22 – 2022-03-26 (×28): 2.5 mg via RESPIRATORY_TRACT
  Filled 2022-03-21 (×28): qty 3

## 2022-03-21 MED ORDER — QUETIAPINE FUMARATE 50 MG PO TABS
50.0000 mg | ORAL_TABLET | Freq: Two times a day (BID) | ORAL | Status: DC
  Administered 2022-03-21 – 2022-03-31 (×18): 50 mg
  Filled 2022-03-21 (×4): qty 2
  Filled 2022-03-21: qty 1
  Filled 2022-03-21 (×2): qty 2
  Filled 2022-03-21: qty 1
  Filled 2022-03-21 (×10): qty 2

## 2022-03-21 NOTE — Progress Notes (Signed)
? ?Trauma/Critical Care Follow Up Note ? ?Subjective:  ?  ?Overnight Issues:  ? ?Objective:  ?Vital signs for last 24 hours: ?Temp:  [98.3 ?F (36.8 ?C)-100.8 ?F (38.2 ?C)] 99.3 ?F (37.4 ?C) (04/01 0800) ?Pulse Rate:  [76-114] 105 (04/01 0900) ?Resp:  [20-36] 23 (04/01 0900) ?BP: (112-168)/(67-95) 140/80 (04/01 0900) ?SpO2:  [91 %-100 %] 91 % (04/01 0900) ?FiO2 (%):  [40 %] 40 % (04/01 0330) ?Weight:  [123.9 kg] 123.9 kg (04/01 0447) ? ?Hemodynamic parameters for last 24 hours: ?  ? ?Intake/Output from previous day: ?03/31 0701 - 04/01 0700 ?In: 2492.9 [I.V.:652.9; NG/GT:1840] ?Out: 1050 [Urine:1050]  ?Intake/Output this shift: ?Total I/O ?In: 139.2 [I.V.:59.2; NG/GT:80] ?Out: -  ? ?Vent settings for last 24 hours: ?Vent Mode: PRVC ?FiO2 (%):  [40 %] 40 % ?Set Rate:  [20 bmp] 20 bmp ?Vt Set:  [710 mL] 710 mL ?PEEP:  [5 cmH20] 5 cmH20 ?Pressure Support:  [5 cmH20] 5 cmH20 ?Plateau Pressure:  [21 cmH20-22 cmH20] 22 cmH20 ? ?Physical Exam:  ?Gen: comfortable, no distress ?Neuro: non-focal exam ?HEENT: PERRL ?Neck: c-collar ?CV: RRR ?Pulm: unlabored breathing ?Abd: soft, NT, PEG, abd binder ?GU: spont voids ?Extr: wwp, no edema ? ? ?Results for orders placed or performed during the hospital encounter of 03/10/22 (from the past 24 hour(s))  ?Glucose, capillary     Status: Abnormal  ? Collection Time: 03/20/22 11:04 AM  ?Result Value Ref Range  ? Glucose-Capillary 146 (H) 70 - 99 mg/dL  ?Glucose, capillary     Status: Abnormal  ? Collection Time: 03/20/22  3:17 PM  ?Result Value Ref Range  ? Glucose-Capillary 113 (H) 70 - 99 mg/dL  ?Glucose, capillary     Status: Abnormal  ? Collection Time: 03/20/22  7:15 PM  ?Result Value Ref Range  ? Glucose-Capillary 113 (H) 70 - 99 mg/dL  ?Glucose, capillary     Status: Abnormal  ? Collection Time: 03/20/22 11:08 PM  ?Result Value Ref Range  ? Glucose-Capillary 119 (H) 70 - 99 mg/dL  ?Glucose, capillary     Status: Abnormal  ? Collection Time: 03/21/22  3:11 AM  ?Result Value Ref  Range  ? Glucose-Capillary 110 (H) 70 - 99 mg/dL  ?Glucose, capillary     Status: Abnormal  ? Collection Time: 03/21/22  7:18 AM  ?Result Value Ref Range  ? Glucose-Capillary 123 (H) 70 - 99 mg/dL  ? ? ?Assessment & Plan: ? ?Present on Admission: ? TBI (traumatic brain injury) ([place]) ? ? ? LOS: 11 days  ? ?Additional comments:I reviewed the patient's new clinical lab test results.   and I reviewed the patients new imaging test results.   ? ?[facility] ?  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 Annette Stable, keppra x7d for sz ppx, TBI therapies once extubated. Repeat CT H 3/23 stable. MRI brain 3/27 with microhemorrhages in cortex, corpaus callosum, basal ganglia, midbrain, pons. Dr. Naaman Plummer with PM&R added amantadine 3/31, recs for uptitration to 200BID in 3-4 if no improvement.  ?C1 TP fx, occipital condyle fx - per Dr. Annette Stable, collar ?Acute ventilator dependent respiratory failure - cont PSV trials, BAL 3/29 pending, expecting NRF, on guaifenisen for thick secretions will add mucomyst today ?ID - suspected PNA, resp CX negative, Maxipime empiric dc'd ?R ear laceration - S/P repair, s/p Clinda x 5d per Dr. Marcelline Deist ?Acute urinary retention - foley out, voiding ?Hyperglycemia - likely from TF, SSI ?FEN - NPO, TF, SLP ?DVT - SCDs, LMWH ?Dispo - ICU, PT/OT, anticipate SNF placement ? ?  Critical Care Total Time: 45 minutes ? ?Jesusita Oka, MD ?Trauma & General Surgery ?Please use [email or web address] to contact on call provider ? ?03/21/2022 ? ?*Care during the described time interval was provided by me. I have reviewed this patient's available data, including medical history, events of note, physical examination and test results as part of my evaluation. ? ? ? ?

## 2022-03-21 NOTE — Progress Notes (Signed)
RT attempted to place patient on 40%/10L ATC. Patient has significant amount of secretions and is needing constant suction. Patient has increased WOB and increased HR. Placed back on full ventilator support for now for patient to settle. ?

## 2022-03-22 ENCOUNTER — Inpatient Hospital Stay (HOSPITAL_COMMUNITY): Payer: Medicaid Other

## 2022-03-22 DIAGNOSIS — R509 Fever, unspecified: Secondary | ICD-10-CM

## 2022-03-22 DIAGNOSIS — S069XAA Unspecified intracranial injury with loss of consciousness status unknown, initial encounter: Secondary | ICD-10-CM

## 2022-03-22 LAB — BASIC METABOLIC PANEL
Anion gap: 8 (ref 5–15)
BUN: 20 mg/dL (ref 6–20)
CO2: 28 mmol/L (ref 22–32)
Calcium: 9 mg/dL (ref 8.9–10.3)
Chloride: 104 mmol/L (ref 98–111)
Creatinine, Ser: 0.8 mg/dL (ref 0.61–1.24)
GFR, Estimated: >60 mL/min (ref >60)
Glucose, Bld: 137 mg/dL — ABNORMAL HIGH (ref 70–99)
Potassium: 4.2 mmol/L (ref 3.5–5.1)
Sodium: 140 mmol/L (ref 135–145)

## 2022-03-22 LAB — POCT I-STAT 7, (LYTES, BLD GAS, ICA,H+H)
Acid-Base Excess: 3 mmol/L — ABNORMAL HIGH (ref 0.0–2.0)
Bicarbonate: 26.4 mmol/L (ref 20.0–28.0)
Calcium, Ion: 1.21 mmol/L (ref 1.15–1.40)
HCT: 30 % — ABNORMAL LOW (ref 39.0–52.0)
Hemoglobin: 10.2 g/dL — ABNORMAL LOW (ref 13.0–17.0)
O2 Saturation: 93 %
Patient temperature: 99.4
Potassium: 4.1 mmol/L (ref 3.5–5.1)
Sodium: 138 mmol/L (ref 135–145)
TCO2: 28 mmol/L (ref 22–32)
pCO2 arterial: 36.8 mmHg (ref 32–48)
pH, Arterial: 7.466 — ABNORMAL HIGH (ref 7.35–7.45)
pO2, Arterial: 63 mmHg — ABNORMAL LOW (ref 83–108)

## 2022-03-22 LAB — GLUCOSE, CAPILLARY
Glucose-Capillary: 121 mg/dL — ABNORMAL HIGH (ref 70–99)
Glucose-Capillary: 122 mg/dL — ABNORMAL HIGH (ref 70–99)
Glucose-Capillary: 118 mg/dL — ABNORMAL HIGH (ref 70–99)
Glucose-Capillary: 122 mg/dL — ABNORMAL HIGH (ref 70–99)
Glucose-Capillary: 122 mg/dL — ABNORMAL HIGH (ref 70–99)
Glucose-Capillary: 145 mg/dL — ABNORMAL HIGH (ref 70–99)

## 2022-03-22 LAB — CBC
HCT: 32.7 % — ABNORMAL LOW (ref 39.0–52.0)
Hemoglobin: 10.8 g/dL — ABNORMAL LOW (ref 13.0–17.0)
MCH: 31.3 pg (ref 26.0–34.0)
MCHC: 33 g/dL (ref 30.0–36.0)
MCV: 94.8 fL (ref 80.0–100.0)
Platelets: 332 10*3/uL (ref 150–400)
RBC: 3.45 MIL/uL — ABNORMAL LOW (ref 4.22–5.81)
RDW: 12.2 % (ref 11.5–15.5)
WBC: 23.1 10*3/uL — ABNORMAL HIGH (ref 4.0–10.5)
nRBC: 0 % (ref 0.0–0.2)

## 2022-03-22 LAB — URINALYSIS, ROUTINE W REFLEX MICROSCOPIC
Bilirubin Urine: NEGATIVE
Glucose, UA: NEGATIVE mg/dL
Hgb urine dipstick: NEGATIVE
Ketones, ur: NEGATIVE mg/dL
Leukocytes,Ua: NEGATIVE
Nitrite: NEGATIVE
Protein, ur: NEGATIVE mg/dL
Specific Gravity, Urine: 1.021 (ref 1.005–1.030)
pH: 6 (ref 5.0–8.0)

## 2022-03-22 LAB — HEPARIN LEVEL (UNFRACTIONATED): Heparin Unfractionated: <0.1 IU/mL — ABNORMAL LOW (ref 0.30–0.70)

## 2022-03-22 MED ORDER — HEPARIN (PORCINE) 25000 UT/250ML-% IV SOLN
3900.0000 [IU]/h | INTRAVENOUS | Status: AC
  Administered 2022-03-22: 1600 [IU]/h via INTRAVENOUS
  Administered 2022-03-23: 2450 [IU]/h via INTRAVENOUS
  Administered 2022-03-23: 2750 [IU]/h via INTRAVENOUS
  Administered 2022-03-23: 2200 [IU]/h via INTRAVENOUS
  Administered 2022-03-24: 3050 [IU]/h via INTRAVENOUS
  Administered 2022-03-24: 3300 [IU]/h via INTRAVENOUS
  Administered 2022-03-25: 3600 [IU]/h via INTRAVENOUS
  Filled 2022-03-22 (×7): qty 250

## 2022-03-22 NOTE — Progress Notes (Signed)
? ?Trauma/Critical Care Follow Up Note ? ?Subjective:  ?  ?Overnight Issues:  ? ?Objective:  ?Vital signs for last 24 hours: ?Temp:  [98.8 ?F (37.1 ?C)-102.7 ?F (39.3 ?C)] 101.9 ?F (38.8 ?C) (04/02 0800) ?Pulse Rate:  [86-113] 107 (04/02 0800) ?Resp:  [21-35] 24 (04/02 0800) ?BP: (114-158)/(64-95) 133/77 (04/02 0800) ?SpO2:  [92 %-98 %] 95 % (04/02 0800) ?FiO2 (%):  [40 %] 40 % (04/02 0400) ?Weight:  [121.8 kg] 121.8 kg (04/02 0500) ? ?Hemodynamic parameters for last 24 hours: ?  ? ?Intake/Output from previous day: ?04/01 0701 - 04/02 0700 ?In: 2134.6 [I.V.:714.6; NG/GT:1360] ?Out: 2500 [Urine:2500]  ?Intake/Output this shift: ?Total I/O ?In: 697.7 [I.V.:57.7; NG/GT:640] ?Out: -  ? ?Vent settings for last 24 hours: ?Vent Mode: PRVC ?FiO2 (%):  [40 %] 40 % ?Set Rate:  [20 bmp] 20 bmp ?Vt Set:  [710 mL] 710 mL ?PEEP:  [5 cmH20] 5 cmH20 ?Pressure Support:  [5 cmH20] 5 cmH20 ?Plateau Pressure:  [17 BHA19-37 cmH20] 17 cmH20 ? ?Physical Exam:  ?Gen: comfortable, no distress ?Neuro: not f/c ?HEENT: PERRL ?Neck: c-collar ?CV: RRR ?Pulm: unlabored breathing on MV ?Abd: soft, NT, incisions cdi ?GU: clear yellow urine ?Extr: wwp, no edema ? ? ?Results for orders placed or performed during the hospital encounter of 03/10/22 (from the past 24 hour(s))  ?Glucose, capillary     Status: Abnormal  ? Collection Time: 03/21/22 11:22 AM  ?Result Value Ref Range  ? Glucose-Capillary 133 (H) 70 - 99 mg/dL  ?Glucose, capillary     Status: Abnormal  ? Collection Time: 03/21/22  3:54 PM  ?Result Value Ref Range  ? Glucose-Capillary 108 (H) 70 - 99 mg/dL  ?Glucose, capillary     Status: Abnormal  ? Collection Time: 03/21/22  7:38 PM  ?Result Value Ref Range  ? Glucose-Capillary 127 (H) 70 - 99 mg/dL  ? Comment 1 Notify RN   ? Comment 2 Document in Chart   ?Glucose, capillary     Status: Abnormal  ? Collection Time: 03/21/22 11:21 PM  ?Result Value Ref Range  ? Glucose-Capillary 125 (H) 70 - 99 mg/dL  ? Comment 1 Notify RN   ? Comment 2  Document in Chart   ?Glucose, capillary     Status: Abnormal  ? Collection Time: 03/22/22  4:00 AM  ?Result Value Ref Range  ? Glucose-Capillary 121 (H) 70 - 99 mg/dL  ? Comment 1 Notify RN   ? Comment 2 Document in Chart   ?I-STAT 7, (LYTES, BLD GAS, ICA, H+H)     Status: Abnormal  ? Collection Time: 03/22/22  5:08 AM  ?Result Value Ref Range  ? pH, Arterial 7.466 (H) 7.35 - 7.45  ? pCO2 arterial 36.8 32 - 48 mmHg  ? pO2, Arterial 63 (L) 83 - 108 mmHg  ? Bicarbonate 26.4 20.0 - 28.0 mmol/L  ? TCO2 28 22 - 32 mmol/L  ? O2 Saturation 93 %  ? Acid-Base Excess 3.0 (H) 0.0 - 2.0 mmol/L  ? Sodium 138 135 - 145 mmol/L  ? Potassium 4.1 3.5 - 5.1 mmol/L  ? Calcium, Ion 1.21 1.15 - 1.40 mmol/L  ? HCT 30.0 (L) 39.0 - 52.0 %  ? Hemoglobin 10.2 (L) 13.0 - 17.0 g/dL  ? Patient temperature 99.4 F   ? Collection site RADIAL, ALLEN'S TEST ACCEPTABLE   ? Drawn by Operator   ? Sample type ARTERIAL   ?CBC     Status: Abnormal  ? Collection Time: 03/22/22  5:11 AM  ?  Result Value Ref Range  ? WBC 23.1 (H) 4.0 - 10.5 K/uL  ? RBC 3.45 (L) 4.22 - 5.81 MIL/uL  ? Hemoglobin 10.8 (L) 13.0 - 17.0 g/dL  ? HCT 32.7 (L) 39.0 - 52.0 %  ? MCV 94.8 80.0 - 100.0 fL  ? MCH 31.3 26.0 - 34.0 pg  ? MCHC 33.0 30.0 - 36.0 g/dL  ? RDW 12.2 11.5 - 15.5 %  ? Platelets 332 150 - 400 K/uL  ? nRBC 0.0 0.0 - 0.2 %  ?Basic metabolic panel     Status: Abnormal  ? Collection Time: 03/22/22  5:11 AM  ?Result Value Ref Range  ? Sodium 140 135 - 145 mmol/L  ? Potassium 4.2 3.5 - 5.1 mmol/L  ? Chloride 104 98 - 111 mmol/L  ? CO2 28 22 - 32 mmol/L  ? Glucose, Bld 137 (H) 70 - 99 mg/dL  ? BUN 20 6 - 20 mg/dL  ? Creatinine, Ser 0.80 0.61 - 1.24 mg/dL  ? Calcium 9.0 8.9 - 10.3 mg/dL  ? GFR, Estimated >60 >60 mL/min  ? Anion gap 8 5 - 15  ?Glucose, capillary     Status: Abnormal  ? Collection Time: 03/22/22  7:57 AM  ?Result Value Ref Range  ? Glucose-Capillary 122 (H) 70 - 99 mg/dL  ? ? ?Assessment & Plan: ?The plan of care was discussed with the bedside nurse for the day,  who is in agreement with this plan and no additional concerns were raised.  ? ?Present on Admission: ? TBI (traumatic brain injury) ([place]) ? ? ? LOS: 12 days  ? ?Additional comments:I reviewed the patient's new clinical lab test results.   and I reviewed the patients new imaging test results.   ? ?[facility] ?  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 Annette Stable, keppra x7d for sz ppx, TBI therapies once extubated. Repeat CT H 3/23 stable. MRI brain 3/27 with microhemorrhages in cortex, corpaus callosum, basal ganglia, midbrain, pons. Dr. Naaman Plummer with PM&R added amantadine 3/31, recs for uptitration to 200BID in 3-4 if no improvement.  ?C1 TP fx, occipital condyle fx - per Dr. Annette Stable, collar ?Acute ventilator dependent respiratory failure - cont PSV trials, BAL 3/29 negtaive, on guaifenisen for thick secretions, added mucomyst 4/1 ?ID - suspected PNA, resp CX negative, Maxipime empiric dc'd; check UA, CXR, and LE duplex today ?R ear laceration - S/P repair, s/p Clinda x 5d per Dr. Marcelline Deist ?Acute urinary retention - foley out, voiding ?Hyperglycemia - likely from TF, SSI ?FEN - NPO, TF, SLP ?DVT - SCDs, LMWH ?Dispo - ICU, PT/OT, anticipate SNF placement ? ?Critical Care Total Time: 45 minutes ? ?Jesusita Oka, MD ?Trauma & General Surgery ?Please use [email or web address] to contact on call provider ? ?03/22/2022 ? ?*Care during the described time interval was provided by me. I have reviewed this patient's available data, including medical history, events of note, physical examination and test results as part of my evaluation. ? ? ? ?

## 2022-03-22 NOTE — Progress Notes (Signed)
VASCULAR LAB ? ? ? ?Bilateral lower extremity venous duplex has been performed. ? ?See CV proc for preliminary results. ? ?Messaged results to Dr. Bedelia Person and Danie Chandler, RN via secure chat ? ?Rose-Marie Hickling, RVT ?03/22/2022, 10:59 AM ? ?
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
## 2022-03-23 ENCOUNTER — Inpatient Hospital Stay (HOSPITAL_COMMUNITY): Payer: Medicaid Other

## 2022-03-23 DIAGNOSIS — J690 Pneumonitis due to inhalation of food and vomit: Secondary | ICD-10-CM

## 2022-03-23 DIAGNOSIS — Z9911 Dependence on respirator [ventilator] status: Secondary | ICD-10-CM

## 2022-03-23 LAB — CBC
HCT: 33.4 % — ABNORMAL LOW (ref 39.0–52.0)
Hemoglobin: 10.9 g/dL — ABNORMAL LOW (ref 13.0–17.0)
MCH: 30.9 pg (ref 26.0–34.0)
MCHC: 32.6 g/dL (ref 30.0–36.0)
MCV: 94.6 fL (ref 80.0–100.0)
Platelets: 321 10*3/uL (ref 150–400)
RBC: 3.53 MIL/uL — ABNORMAL LOW (ref 4.22–5.81)
RDW: 12.1 % (ref 11.5–15.5)
WBC: 18.7 10*3/uL — ABNORMAL HIGH (ref 4.0–10.5)
nRBC: 0 % (ref 0.0–0.2)

## 2022-03-23 LAB — BASIC METABOLIC PANEL
Anion gap: 9 (ref 5–15)
BUN: 20 mg/dL (ref 6–20)
CO2: 25 mmol/L (ref 22–32)
Calcium: 9.1 mg/dL (ref 8.9–10.3)
Chloride: 104 mmol/L (ref 98–111)
Creatinine, Ser: 0.7 mg/dL (ref 0.61–1.24)
GFR, Estimated: >60 mL/min (ref >60)
Glucose, Bld: 143 mg/dL — ABNORMAL HIGH (ref 70–99)
Potassium: 4.1 mmol/L (ref 3.5–5.1)
Sodium: 138 mmol/L (ref 135–145)

## 2022-03-23 LAB — GLUCOSE, CAPILLARY
Glucose-Capillary: 133 mg/dL — ABNORMAL HIGH (ref 70–99)
Glucose-Capillary: 147 mg/dL — ABNORMAL HIGH (ref 70–99)
Glucose-Capillary: 115 mg/dL — ABNORMAL HIGH (ref 70–99)
Glucose-Capillary: 132 mg/dL — ABNORMAL HIGH (ref 70–99)
Glucose-Capillary: 120 mg/dL — ABNORMAL HIGH (ref 70–99)
Glucose-Capillary: 118 mg/dL — ABNORMAL HIGH (ref 70–99)

## 2022-03-23 LAB — HEPARIN LEVEL (UNFRACTIONATED)
Heparin Unfractionated: <0.1 IU/mL — ABNORMAL LOW (ref 0.30–0.70)
Heparin Unfractionated: <0.1 [IU]/mL — ABNORMAL LOW (ref 0.30–0.70)
Heparin Unfractionated: <0.1 [IU]/mL — ABNORMAL LOW (ref 0.30–0.70)

## 2022-03-23 MED ORDER — ACETAMINOPHEN 10 MG/ML IV SOLN
1000.0000 mg | Freq: Four times a day (QID) | INTRAVENOUS | Status: AC
  Administered 2022-03-23 – 2022-03-24 (×4): 1000 mg via INTRAVENOUS
  Filled 2022-03-23 (×4): qty 100

## 2022-03-23 MED ORDER — FUROSEMIDE 10 MG/ML IJ SOLN
40.0000 mg | Freq: Once | INTRAMUSCULAR | Status: AC
Start: 2022-03-23 — End: 2022-03-23
  Administered 2022-03-23: 40 mg via INTRAVENOUS
  Filled 2022-03-23: qty 4

## 2022-03-23 MED ORDER — DIATRIZOATE MEGLUMINE & SODIUM 66-10 % PO SOLN
ORAL | Status: AC
  Administered 2022-03-23: 30 mL
  Filled 2022-03-23: qty 30

## 2022-03-23 MED ORDER — DIATRIZOATE MEGLUMINE & SODIUM 66-10 % PO SOLN
30.0000 mL | Freq: Once | ORAL | Status: AC
  Filled 2022-03-23: qty 30

## 2022-03-23 MED ORDER — IOHEXOL 300 MG/ML  SOLN
100.0000 mL | Freq: Once | INTRAMUSCULAR | Status: AC | PRN
  Administered 2022-03-23: 100 mL via INTRAVENOUS

## 2022-03-23 MED ORDER — AMANTADINE HCL 50 MG/5ML PO SOLN
200.0000 mg | Freq: Two times a day (BID) | ORAL | Status: DC
Start: 2022-03-23 — End: 2022-05-02
  Administered 2022-03-23 – 2022-05-02 (×78): 200 mg
  Filled 2022-03-23 (×85): qty 20

## 2022-03-23 MED ORDER — SODIUM CHLORIDE 3 % IN NEBU: 4.0000 mL | INHALATION_SOLUTION | Freq: Two times a day (BID) | RESPIRATORY_TRACT | Status: DC
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
## 2022-03-23 NOTE — Progress Notes (Signed)
Pt having tube feeds leaking from around PEG. Dr. Janee Morn notified and evaluated at [facility] and notified Dr. Bedelia Person. Dr. Bed[facility] to [facility] and will place orders for further evaluation.  ?

## 2022-03-23 NOTE — Progress Notes (Signed)
RT NOTE: RT transported patient on ventilator from room 4N16 to CT and back to room 4N16 with no complications. Vitals are stable. RT will continue to monitor.  
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
## 2022-03-23 NOTE — Progress Notes (Signed)
Patient ID: Carl Carpenter, male   DOB: Apr 14, 1986, 36 y.o.   MRN: [id number] ?Follow up - Trauma Critical Care ?  ?Patient Details:  ?  ?Carl Carpenter is an 36 y.o. male. ? ?Lines/tubes ?: ?Gastrostomy/Enterostomy PEG-jejunostomy 24 Fr. LUQ (Active)  ?Surrounding Skin Intact;Dry 03/22/22 2000  ?Tube Status Patent 03/22/22 2000  ?Drainage Appearance None 03/22/22 2000  ?Dressing Status Clean, Dry, Intact 03/22/22 2000  ?Dressing Intervention New dressing 03/22/22 2000  ?Dressing Type Abdominal Binder;Split gauze 03/22/22 2000  ?Dressing Change Due 03/20/22 03/20/22 2000  ?G Port Intake (mL) 60 ml 03/22/22 0000  ?   ?External Urinary Catheter (Active)  ?Psychologist, sport and exercise Dedicated Suction Canister 03/22/22 2000  ?Suction (Verified suction is between 40-80 mmHg) Yes 03/22/22 2000  ?Securement Method Securing device (Describe) 03/22/22 2000  ?Site Assessment Clean, Dry, Intact 03/22/22 2000  ?Intervention Male External Urinary Catheter Replaced 03/22/22 2000  ?Output (mL) 300 mL 03/22/22 1400  ?   ?Fecal Management System (Active)  ?Does patient meet criteria for removal? No 03/22/22 2000  ?Daily care Skin around tube assessed 03/22/22 2000  ?Output (mL) 50 mL 03/19/22 0700  ?Intake (mL) 30 mL 03/18/22 1200  ? ? ?Microbiology/Sepsis markers: ?Results for orders placed or performed during the hospital encounter of 03/10/22  ?Resp Panel by RT-PCR (Flu A&B, Covid) Nasopharyngeal Swab     Status: None  ? Collection Time: 03/10/22  9:17 PM  ? Specimen: Nasopharyngeal Swab; Nasopharyngeal(NP) swabs in vial transport medium  ?Result Value Ref Range Status  ? SARS Coronavirus 2 by RT PCR NEGATIVE NEGATIVE Final  ?  Comment: (NOTE) ?SARS-CoV-2 target nucleic acids are NOT DETECTED. ? ?The SARS-CoV-2 RNA is generally detectable in upper respiratory ?specimens during the acute phase of infection. The lowest ?concentration of SARS-CoV-2 viral copies this assay can detect is ?138 copies/mL. A negative result does not preclude  SARS-Cov-2 ?infection and should not be used as the sole basis for treatment or ?other patient management decisions. A negative result may occur with  ?improper specimen collection/handling, submission of specimen other ?than nasopharyngeal swab, presence of viral mutation(s) within the ?areas targeted by this assay, and inadequate number of viral ?copies(<138 copies/mL). A negative result must be combined with ?clinical observations, patient history, and epidemiological ?information. The expected result is Negative. ? ?Fact Sheet for Patients:  ?EntrepreneurPulse.com.au ? ?Fact Sheet for Healthcare Providers:  ?IncredibleEmployment.be ? ?This test is no t yet approved or cleared by the [place] FDA and  ?has been authorized for detection and/or diagnosis of SARS-CoV-2 by ?FDA under an Emergency Use Authorization (EUA). This EUA will remain  ?in effect (meaning this test can be used) for the duration of the ?COVID-19 declaration under Section 564(b)(1) of the Act, 21 ?U.S.C.section 360bbb-3(b)(1), unless the authorization is terminated  ?or revoked sooner.  ? ? ?  ? Influenza A by PCR NEGATIVE NEGATIVE Final  ? Influenza B by PCR NEGATIVE NEGATIVE Final  ?  Comment: (NOTE) ?The Xpert Xpress SARS-CoV-2/FLU/RSV plus assay is intended as an aid ?in the diagnosis of influenza from Nasopharyngeal swab specimens and ?should not be used as a sole basis for treatment. Nasal washings and ?aspirates are unacceptable for Xpert Xpress SARS-CoV-2/FLU/RSV ?testing. ? ?Fact Sheet for Patients: ?EntrepreneurPulse.com.au ? ?Fact Sheet for Healthcare Providers: ?IncredibleEmployment.be ? ?This test is not yet approved or cleared by the [place] FDA and ?has been authorized for detection and/or diagnosis of SARS-CoV-2 by ?FDA under an Emergency Use Authorization (EUA). This EUA will remain ?in  effect (meaning this test can be used) for the duration of  the ?COVID-19 declaration under Section 564(b)(1) of the Act, 21 U.S.C. ?section 360bbb-3(b)(1), unless the authorization is terminated or ?revoked. ? ?Performed at [facility], [place] [address] [place], [place] ?[address] ?  ?MRSA Next Gen by PCR, Nasal     Status: None  ? Collection Time: 03/10/22 11:10 PM  ? Specimen: Nasal Mucosa; Nasal Swab  ?Result Value Ref Range Status  ? MRSA by PCR Next Gen NOT DETECTED NOT DETECTED Final  ?  Comment: (NOTE) ?The GeneXpert MRSA Assay (FDA approved for NASAL specimens only), ?is one component of a comprehensive MRSA colonization surveillance ?program. It is not intended to diagnose MRSA infection nor to guide ?or monitor treatment for MRSA infections. ?Test performance is not FDA approved in patients less than 2 years ?old. ?Performed at [facility], [place] [address] [place], [place] ?[address] ?  ?Culture, Respiratory w Gram Stain     Status: None  ? Collection Time: 03/13/22  3:36 PM  ? Specimen: Tracheal Aspirate; Respiratory  ?Result Value Ref Range Status  ? Specimen Description TRACHEAL ASPIRATE  Final  ? Special Requests NONE  Final  ? Gram Stain   Final  ?  FEW WBC PRESENT,BOTH PMN AND MONONUCLEAR ?MODERATE GRAM VARIABLE ROD ?FEW GRAM POSITIVE COCCI ?RARE BUDDING YEAST SEEN ?  ? Culture   Final  ?  FEW Normal respiratory flora-no Staph aureus or Pseudomonas seen ?Performed at [facility], [place] [address], [place], [place] [address] ?  ? Report Status 03/15/2022 FINAL  Final  ?Culture, Respiratory w Gram Stain     Status: None  ? Collection Time: 03/18/22 12:40 PM  ? Specimen: Bronchoalveolar Lavage; Respiratory  ?Result Value Ref Range Status  ? Specimen Description BRONCHIAL ALVEOLAR LAVAGE  Final  ? Special Requests Normal  Final  ? Gram Stain NO WBC SEEN ?NO ORGANISMS SEEN ?  Final  ? Culture   Final  ?  RARE Normal respiratory flora-no Staph aureus or Pseudomonas seen ?Performed at [facility], [place] [address], [place], [place]  [address] ?  ? Report Status 03/21/2022 FINAL  Final  ? ? ?Anti-infectives:  ?Anti-infectives (From admission, onward)  ? ? Start     Dose/Rate Route Frequency Ordered Stop  ? 03/14/22 1015  ceFEPIme (MAXIPIME) 2 g in sodium chloride 0.9 % 100 mL IVPB  Status:  Discontinued       ? 2 g ?200 mL/hr over 30 Minutes Intravenous Every 12 hours 03/14/22 0926 03/14/22 0934  ? 03/14/22 1000  ceFEPIme (MAXIPIME) 2 g in sodium chloride 0.9 % 100 mL IVPB  Status:  Discontinued       ? 2 g ?200 mL/hr over 30 Minutes Intravenous Every 8 hours 03/14/22 0935 03/16/22 1017  ? 03/11/22 0915  clindamycin (CLEOCIN) 75 MG/5ML solution 150 mg       ? 150 mg Per Tube Every 8 hours 03/11/22 0823 03/15/22 2131  ? 03/10/22 2215  ceFAZolin (ANCEF) IVPB 2g/100 mL premix       ? 2 g ?200 mL/hr over 30 Minutes Intravenous  Once 03/10/22 2202 03/10/22 2307  ? ?  ? ? ?Best Practice/Protocols:  ?VTE Prophylaxis: Heparin (drip) ?Continous Sedation ? ?Consults: ?Treatment Team:  ?Earnie Larsson, MD  ? ? ?Studies: ? ? ? ?Events: ? ?Subjective:  ?  ?Overnight Issues:  ? ?Objective:  ?Vital signs for last 24 hours: ?Temp:  [99.8 ?F (  37.7 ?C)-101.9 ?F (38.8 ?C)] 99.8 ?F (37.7 ?C) (04/03 0400) ?Pulse Rate:  [82-124] 84 (04/03 0600) ?Resp:  [20-34] 22 (04/03 0600) ?BP: (106-179)/(67-139) 119/78 (04/03 0600) ?SpO2:  [91 %-97 %] 96 % (04/03 0600) ?FiO2 (%):  [40 %] 40 % (04/03 0315) ? ?Hemodynamic parameters for last 24 hours: ?  ? ?Intake/Output from previous day: ?04/02 0701 - 04/03 0700 ?In: 2605 [I.V.:1085; NG/GT:1520] ?Out: 1450 [Urine:1450]  ?Intake/Output this shift: ?No intake/output data recorded. ? ?Vent settings for last 24 hours: ?Vent Mode: PRVC ?FiO2 (%):  [40 %] 40 % ?Set Rate:  [20 bmp] 20 bmp ?Vt Set:  [710 mL] 710 mL ?PEEP:  [5 cmH20] 5 cmH20 ?Pressure Support:  [5 cmH20] 5 cmH20 ?Plateau Pressure:  [18 QRF75-88 cmH20] 20 cmH20 ? ?Physical Exam:  ?General: on vent ?Neuro: spont movement L hand but postures to pain ?HEENT/Neck: trach-clean,  intact ?Resp: few rhonchi ?CVS: RRR ?GI: soft, some drainage around G tube ?Extremities: mild edema ? ?Results for orders placed or performed during the hospital encounter of 03/10/22 (from the past 24 hour(s))  ?Glu

## 2022-03-23 NOTE — Progress Notes (Signed)
Patient ID: Carl Carpenter, male   DOB: 1986/11/27, 36 y.o.   MRN: [id number] ?PEG tube site was draining a little bit this morning.  Late this afternoon, it began draining a large amount of tube feeds.  CT scan was performed which demonstrated the PEG tube was dislodged and within the rectus muscle.  On the CT, the stomach does look up against the inside of the abdominal wall in that location.  On exam, I easily removed the PEG tube.  Gastric contents are flowing easily from the site consistent with good positioning of the stomach.  I was able to pass a 14 [place] Foley very easily into what felt like back into the stomach.  I inflated the balloon and it flushes easily.  I will check a stat tube study.  This will likely need to be changed to regular G-tube by interventional radiology at some point if, indeed, it is in place.  I spoke with his wife at the bedside. ? ?Violeta Gelinas, MD, MPH, FACS ?Please use [email or web address] to contact on call provider ? ?
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
## 2022-03-23 NOTE — Progress Notes (Signed)
SLP Cancellation Note ? ?Patient Details ?Name: Carl Carpenter ?MRN: [id number] ?DOB: Dec 14, 1986 ? ? ?Cancelled evaluation:     Pt not ready for PMV or swallow assessments. Will follow along. ? ?Brenae Lasecki L. Esther Broyles, MA CCC/SLP ?Acute Rehabilitation Services ?Office number [phone] ?Pager [phone] ?    ? ? ?Juan Quam Laurice ?03/23/2022, 10:57 AM ?

## 2022-03-23 NOTE — Progress Notes (Signed)
TF turned off at 1700 d/t leakage around insertion site. Taken to CT at 1830. Returned to room at [facility]. Tolerated transport well.  ?
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
## 2022-03-24 ENCOUNTER — Inpatient Hospital Stay (HOSPITAL_COMMUNITY): Payer: Medicaid Other

## 2022-03-24 HISTORY — PX: IR REPLC GASTRO/COLONIC TUBE PERCUT W/FLUORO: IMG2333

## 2022-03-24 LAB — BASIC METABOLIC PANEL
Anion gap: 9 (ref 5–15)
Anion gap: 12 (ref 5–15)
BUN: 22 mg/dL — ABNORMAL HIGH (ref 6–20)
BUN: 23 mg/dL — ABNORMAL HIGH (ref 6–20)
CO2: 25 mmol/L (ref 22–32)
CO2: 26 mmol/L (ref 22–32)
Calcium: 9.2 mg/dL (ref 8.9–10.3)
Calcium: 9.4 mg/dL (ref 8.9–10.3)
Chloride: 107 mmol/L (ref 98–111)
Chloride: 104 mmol/L (ref 98–111)
Creatinine, Ser: 0.64 mg/dL (ref 0.61–1.24)
Creatinine, Ser: 0.72 mg/dL (ref 0.61–1.24)
GFR, Estimated: >60 mL/min (ref >60)
GFR, Estimated: >60 mL/min (ref >60)
Glucose, Bld: 118 mg/dL — ABNORMAL HIGH (ref 70–99)
Glucose, Bld: 101 mg/dL — ABNORMAL HIGH (ref 70–99)
Potassium: 3.8 mmol/L (ref 3.5–5.1)
Potassium: 3.6 mmol/L (ref 3.5–5.1)
Sodium: 141 mmol/L (ref 135–145)
Sodium: 142 mmol/L (ref 135–145)

## 2022-03-24 LAB — CBC
HCT: 30.9 % — ABNORMAL LOW (ref 39.0–52.0)
HCT: 33.8 % — ABNORMAL LOW (ref 39.0–52.0)
Hemoglobin: 10.2 g/dL — ABNORMAL LOW (ref 13.0–17.0)
Hemoglobin: 10.8 g/dL — ABNORMAL LOW (ref 13.0–17.0)
MCH: 31.3 pg (ref 26.0–34.0)
MCH: 30.3 pg (ref 26.0–34.0)
MCHC: 33 g/dL (ref 30.0–36.0)
MCHC: 32 g/dL (ref 30.0–36.0)
MCV: 94.8 fL (ref 80.0–100.0)
MCV: 94.9 fL (ref 80.0–100.0)
Platelets: 331 10*3/uL (ref 150–400)
Platelets: 356 10*3/uL (ref 150–400)
RBC: 3.26 MIL/uL — ABNORMAL LOW (ref 4.22–5.81)
RBC: 3.56 MIL/uL — ABNORMAL LOW (ref 4.22–5.81)
RDW: 12.1 % (ref 11.5–15.5)
RDW: 12.2 % (ref 11.5–15.5)
WBC: 17.8 10*3/uL — ABNORMAL HIGH (ref 4.0–10.5)
WBC: 16.9 10*3/uL — ABNORMAL HIGH (ref 4.0–10.5)
nRBC: 0 % (ref 0.0–0.2)
nRBC: 0 % (ref 0.0–0.2)

## 2022-03-24 LAB — GLUCOSE, CAPILLARY
Glucose-Capillary: 107 mg/dL — ABNORMAL HIGH (ref 70–99)
Glucose-Capillary: 113 mg/dL — ABNORMAL HIGH (ref 70–99)
Glucose-Capillary: 116 mg/dL — ABNORMAL HIGH (ref 70–99)
Glucose-Capillary: 116 mg/dL — ABNORMAL HIGH (ref 70–99)
Glucose-Capillary: 120 mg/dL — ABNORMAL HIGH (ref 70–99)
Glucose-Capillary: 121 mg/dL — ABNORMAL HIGH (ref 70–99)

## 2022-03-24 LAB — HEPARIN LEVEL (UNFRACTIONATED)
Heparin Unfractionated: 0.1 [IU]/mL — ABNORMAL LOW (ref 0.30–0.70)
Heparin Unfractionated: 0.16 IU/mL — ABNORMAL LOW (ref 0.30–0.70)
Heparin Unfractionated: 0.22 [IU]/mL — ABNORMAL LOW (ref 0.30–0.70)

## 2022-03-24 MED ORDER — LIDOCAINE VISCOUS HCL 2 % MT SOLN
OROMUCOSAL | Status: AC
Start: 2022-03-24 — End: 2022-03-24
  Filled 2022-03-24: qty 15

## 2022-03-24 MED ORDER — GLYCOPYRROLATE 0.2 MG/ML IJ SOLN
0.2000 mg | Freq: Three times a day (TID) | INTRAMUSCULAR | Status: DC
  Administered 2022-03-24 – 2022-03-26 (×7): 0.2 mg via INTRAVENOUS
  Filled 2022-03-24 (×7): qty 1

## 2022-03-24 MED ORDER — IOHEXOL 300 MG/ML  SOLN
100.0000 mL | Freq: Once | INTRAMUSCULAR | Status: AC | PRN
  Administered 2022-03-24: 20 mL
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
## 2022-03-24 NOTE — Procedures (Signed)
Exchange of existing Gastrostomy tube for  24 Fr  using fluoroscopy guidance. New tube positioned appropriately.  20 ml of normal saline instilled in the retention balloon port and the flange repositioned for comfort. ?  ?  ?Ok to begin using gastrostomy   Please call IR for any questions or concerns regarding the g-tube ? ?
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
## 2022-03-24 NOTE — Progress Notes (Signed)
Tube feed still leaking around PEG site. Saturated two dressings in last 4 hours and continuously oozing from site. Tube feed currently going at goal of 30ml/hr. Notified Dr Royanne Foots. Ordered to decrease tube feed rate down to 13ml/hr. ?

## 2022-03-24 NOTE — Progress Notes (Signed)
MD notified of PEG placement in IR. Per MD ok to use G tube for medications and TF.  ?
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
## 2022-03-25 LAB — GLUCOSE, CAPILLARY
Glucose-Capillary: 118 mg/dL — ABNORMAL HIGH (ref 70–99)
Glucose-Capillary: 103 mg/dL — ABNORMAL HIGH (ref 70–99)
Glucose-Capillary: 114 mg/dL — ABNORMAL HIGH (ref 70–99)
Glucose-Capillary: 98 mg/dL (ref 70–99)
Glucose-Capillary: 96 mg/dL (ref 70–99)
Glucose-Capillary: 90 mg/dL (ref 70–99)

## 2022-03-25 LAB — BASIC METABOLIC PANEL
Anion gap: 9 (ref 5–15)
BUN: 19 mg/dL (ref 6–20)
CO2: 27 mmol/L (ref 22–32)
Calcium: 9.2 mg/dL (ref 8.9–10.3)
Chloride: 103 mmol/L (ref 98–111)
Creatinine, Ser: 0.68 mg/dL (ref 0.61–1.24)
GFR, Estimated: >60 mL/min (ref >60)
Glucose, Bld: 109 mg/dL — ABNORMAL HIGH (ref 70–99)
Potassium: 3.3 mmol/L — ABNORMAL LOW (ref 3.5–5.1)
Sodium: 139 mmol/L (ref 135–145)

## 2022-03-25 LAB — CBC
HCT: 31.2 % — ABNORMAL LOW (ref 39.0–52.0)
Hemoglobin: 10.1 g/dL — ABNORMAL LOW (ref 13.0–17.0)
MCH: 30.8 pg (ref 26.0–34.0)
MCHC: 32.4 g/dL (ref 30.0–36.0)
MCV: 95.1 fL (ref 80.0–100.0)
Platelets: 409 10*3/uL — ABNORMAL HIGH (ref 150–400)
RBC: 3.28 MIL/uL — ABNORMAL LOW (ref 4.22–5.81)
RDW: 12 % (ref 11.5–15.5)
WBC: 17.6 10*3/uL — ABNORMAL HIGH (ref 4.0–10.5)
nRBC: 0 % (ref 0.0–0.2)

## 2022-03-25 LAB — HEPARIN LEVEL (UNFRACTIONATED): Heparin Unfractionated: 0.16 IU/mL — ABNORMAL LOW (ref 0.30–0.70)

## 2022-03-25 MED ORDER — ACETAMINOPHEN 10 MG/ML IV SOLN
1000.0000 mg | Freq: Four times a day (QID) | INTRAVENOUS | Status: AC
  Administered 2022-03-25 – 2022-03-26 (×4): 1000 mg via INTRAVENOUS
  Filled 2022-03-25 (×4): qty 100

## 2022-03-25 MED ORDER — ENOXAPARIN SODIUM 120 MG/0.8ML IJ SOSY
120.0000 mg | PREFILLED_SYRINGE | Freq: Two times a day (BID) | INTRAMUSCULAR | Status: DC
  Administered 2022-03-25 – 2022-04-01 (×14): 120 mg via SUBCUTANEOUS
  Filled 2022-03-25 (×17): qty 0.8

## 2022-03-25 MED ORDER — ACETAMINOPHEN 160 MG/5ML PO SOLN
1000.0000 mg | Freq: Four times a day (QID) | ORAL | Status: DC
  Administered 2022-03-25: 1000 mg
  Filled 2022-03-25: qty 40.6

## 2022-03-25 MED ORDER — ACETAMINOPHEN 500 MG PO TABS: 1000.0000 mg | ORAL_TABLET | Freq: Four times a day (QID) | ORAL | Status: DC
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
## 2022-03-25 NOTE — Progress Notes (Signed)
Per MD do not use PEG at this time, hold all TF and meds.  ?

## 2022-03-25 NOTE — Progress Notes (Signed)
Trauma Event Note ? ?TRN rounded on patient. S/P trach and PEG, PEG use currently held d/t drainage around site. VS WDL.  Family at bedside. Checked in with primary nurse, no needs at this time.  ? ?Last imported Vital Signs ?BP 133/85   Pulse 86   Temp 98.4 ?F (36.9 ?C) (Axillary)   Resp 20   Ht 6\' 5"  (1.956 m)   Wt 258 lb 9.6 oz (117.3 kg)   SpO2 100%   BMI 30.67 kg/m?  ? ?Trending CBC ?Recent Labs  ?  03/24/22 ?0120 03/24/22 ?[id number] 03/25/22 ?0419  ?WBC 17.8* 16.9* 17.6*  ?HGB 10.2* 10.8* 10.1*  ?HCT 30.9* 33.8* 31.2*  ?PLT 331 356 409*  ? ? ?Trending Coag's ?No results for input(s): APTT, INR in the last 72 hours. ? ?Trending BMET ?Recent Labs  ?  03/24/22 ?0120 03/24/22 ?[id number] 03/25/22 ?0419  ?NA 141 142 139  ?K 3.8 3.6 3.3*  ?CL 107 104 103  ?CO2 25 26 27   ?BUN 22* 23* 19  ?CREATININE 0.64 0.72 0.68  ?GLUCOSE 118* 101* 109*  ? ? ? ? ?Tea  ?Trauma Response RN ? ?Please call TRN at [phone] for further assistance. ? ? ?  ?

## 2022-03-25 NOTE — Progress Notes (Signed)
Pt remains on SBT 15/5 40%. RT attempted to wean PS from 15 to 12. Pt did not tolerate well, HR increased from 90 to 120's. PS increased back to 15 and pt HR returned to baseline. RT to notify MD and will continue to monitor.  ?

## 2022-03-25 NOTE — Progress Notes (Signed)
SLP Cancellation Note ? ?Patient Details ?Name: Carl Carpenter ?MRN: [id number] ?DOB: [birth year] ? ? ?Cancelled treatment:        Spoke with RN.  Pt on vent but weaning, with hopes for TCT later today.  SLP will follow for PMV trial, swallow assessment, and cognitive-linguistic evaluation when medically appropriate. ? ? ?[name], MA, CCC-SLP ?Acute Rehabilitation Services ?Office: [phone] ?03/25/2022, 9:40 AM ?

## 2022-03-25 NOTE — Progress Notes (Signed)
? ?Trauma/Critical Care Follow Up Note ? ?Subjective:  ?  ?Overnight Issues:  ? ?Objective:  ?Vital signs for last 24 hours: ?Temp:  [98.3 ?F (36.8 ?C)-101.2 ?F (38.4 ?C)] 98.3 ?F (36.8 ?C) (04/05 1200) ?Pulse Rate:  [86-119] 105 (04/05 1200) ?Resp:  [11-39] 16 (04/05 1200) ?BP: (74-159)/(43-121) 132/82 (04/05 1200) ?SpO2:  [90 %-100 %] 98 % (04/05 1200) ?FiO2 (%):  [40 %] 40 % (04/05 1119) ?Weight:  [117.3 kg] 117.3 kg (04/05 0500) ? ?Hemodynamic parameters for last 24 hours: ?  ? ?Intake/Output from previous day: ?04/04 0701 - 04/05 0700 ?In: 3175.5 [I.V.:1602.8; NG/GT:1082.7; IV Piggyback:300] ?Out: 1500 [Urine:1500]  ?Intake/Output this shift: ?Total I/O ?In: 129.9 [I.V.:59.9; Other:30; NG/GT:40] ?Out: -  ? ?Vent settings for last 24 hours: ?Vent Mode: PSV;CPAP ?FiO2 (%):  [40 %] 40 % ?Set Rate:  [20 bmp] 20 bmp ?Vt Set:  [710 mL] 710 mL ?PEEP:  [5 cmH20] 5 cmH20 ?Pressure Support:  [15 cmH20] 15 cmH20 ?Plateau Pressure:  [15 cmH20-19 cmH20] 17 cmH20 ? ?Physical Exam:  ?Gen: comfortable, no distress ?Neuro: f/c yesterday, but not this AM ?HEENT: PERRL ?Neck: supple, trached ?CV: RRR ?Pulm: unlabored breathing ?Abd: soft, NT, PEG  ?GU: clear yellow urine ?Extr: wwp, no edema ? ? ? ?Results for orders placed or performed during the hospital encounter of 03/10/22 (from the past 24 hour(s))  ?Glucose, capillary     Status: Abnormal  ? Collection Time: 03/24/22  3:36 PM  ?Result Value Ref Range  ? Glucose-Capillary 107 (H) 70 - 99 mg/dL  ?Heparin level (unfractionated)     Status: Abnormal  ? Collection Time: 03/24/22  5:57 PM  ?Result Value Ref Range  ? Heparin Unfractionated 0.22 (L) 0.30 - 0.70 IU/mL  ?Glucose, capillary     Status: Abnormal  ? Collection Time: 03/24/22  7:22 PM  ?Result Value Ref Range  ? Glucose-Capillary 121 (H) 70 - 99 mg/dL  ?Glucose, capillary     Status: Abnormal  ? Collection Time: 03/24/22 11:32 PM  ?Result Value Ref Range  ? Glucose-Capillary 116 (H) 70 - 99 mg/dL  ?Glucose, capillary      Status: Abnormal  ? Collection Time: 03/25/22  3:19 AM  ?Result Value Ref Range  ? Glucose-Capillary 118 (H) 70 - 99 mg/dL  ?Heparin level (unfractionated)     Status: Abnormal  ? Collection Time: 03/25/22  4:19 AM  ?Result Value Ref Range  ? Heparin Unfractionated 0.16 (L) 0.30 - 0.70 IU/mL  ?CBC     Status: Abnormal  ? Collection Time: 03/25/22  4:19 AM  ?Result Value Ref Range  ? WBC 17.6 (H) 4.0 - 10.5 K/uL  ? RBC 3.28 (L) 4.22 - 5.81 MIL/uL  ? Hemoglobin 10.1 (L) 13.0 - 17.0 g/dL  ? HCT 31.2 (L) 39.0 - 52.0 %  ? MCV 95.1 80.0 - 100.0 fL  ? MCH 30.8 26.0 - 34.0 pg  ? MCHC 32.4 30.0 - 36.0 g/dL  ? RDW 12.0 11.5 - 15.5 %  ? Platelets 409 (H) 150 - 400 K/uL  ? nRBC 0.0 0.0 - 0.2 %  ?Basic metabolic panel     Status: Abnormal  ? Collection Time: 03/25/22  4:19 AM  ?Result Value Ref Range  ? Sodium 139 135 - 145 mmol/L  ? Potassium 3.3 (L) 3.5 - 5.1 mmol/L  ? Chloride 103 98 - 111 mmol/L  ? CO2 27 22 - 32 mmol/L  ? Glucose, Bld 109 (H) 70 - 99 mg/dL  ? BUN 19  6 - 20 mg/dL  ? Creatinine, Ser 0.68 0.61 - 1.24 mg/dL  ? Calcium 9.2 8.9 - 10.3 mg/dL  ? GFR, Estimated >60 >60 mL/min  ? Anion gap 9 5 - 15  ?Glucose, capillary     Status: Abnormal  ? Collection Time: 03/25/22  7:42 AM  ?Result Value Ref Range  ? Glucose-Capillary 103 (H) 70 - 99 mg/dL  ?Glucose, capillary     Status: Abnormal  ? Collection Time: 03/25/22 11:35 AM  ?Result Value Ref Range  ? Glucose-Capillary 114 (H) 70 - 99 mg/dL  ? ? ?Assessment & Plan: ?The plan of care was discussed with the bedside nurse for the day, who is in agreement with this plan and no additional concerns were raised.  ? ?Present on Admission: ? TBI (traumatic brain injury) ([place]) ? ? ? LOS: 15 days  ? ?Additional comments:I reviewed the patient's new clinical lab test results.   and I reviewed the patients new imaging test results.   ? ?[facility] ?  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 Annette Stable, keppra x7d for sz ppx, TBI therapies once extubated. Repeat CT H 3/23 stable. MRI  brain 3/27 with microhemorrhages in cortex, corpaus callosum, basal ganglia, midbrain, pons. Dr. Naaman Plummer with PM&R added amantadine 3/31, increase to 273m BID 4/3 ?C1 TP fx, occipital condyle fx - per Dr. PAnnette Stable collar ?Acute ventilator dependent respiratory failure - cont PSV trials, did wean some yesterday, BAL 3/29 negative, on guaifenisen for thick secretions, added mucomyst 4/1, add robinul 4/4, consider increasing 4/6 if still having copious secretions ?ID - fever/leukocytosis may be from DVT vs dislodged PEG ?R ear laceration - S/P repair, completed Clinda x 5d per Dr. CMarcelline Deist?Dislodged PEG - Dr. TGrandville Silosable to place foley through the tract, IR PEG replacement 4/4. Tube feeds around the tube, so lowered rate to 1/2 goal  ?Acute urinary retention - foley out, voiding ?Hyperglycemia - likely from TF, SSI ?FEN - NPO, TF, SLP ?R post tib and peroneal DVT - heparin drip, transition to tx-ic LMWH due to volume and not achieving PTT goals. ?Dispo - ICU, PT/OT, anticipate SNF placement ? ?Critical Care Total Time: 35 minutes ? ?AJesusita Oka MD ?Trauma & General Surgery ?Please use [email or web address] to contact on call provider ? ?03/25/2022 ? ?*Care during the described time interval was provided by me. I have reviewed this patient's available data, including medical history, events of note, physical examination and test results as part of my evaluation. ? ? ? ?
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
## 2022-03-26 LAB — CBC
HCT: 33.5 % — ABNORMAL LOW (ref 39.0–52.0)
Hemoglobin: 10.8 g/dL — ABNORMAL LOW (ref 13.0–17.0)
MCH: 30.5 pg (ref 26.0–34.0)
MCHC: 32.2 g/dL (ref 30.0–36.0)
MCV: 94.6 fL (ref 80.0–100.0)
Platelets: 436 10*3/uL — ABNORMAL HIGH (ref 150–400)
RBC: 3.54 MIL/uL — ABNORMAL LOW (ref 4.22–5.81)
RDW: 12 % (ref 11.5–15.5)
WBC: 11.3 10*3/uL — ABNORMAL HIGH (ref 4.0–10.5)
nRBC: 0 % (ref 0.0–0.2)

## 2022-03-26 LAB — GLUCOSE, CAPILLARY
Glucose-Capillary: 116 mg/dL — ABNORMAL HIGH (ref 70–99)
Glucose-Capillary: 104 mg/dL — ABNORMAL HIGH (ref 70–99)
Glucose-Capillary: 95 mg/dL (ref 70–99)
Glucose-Capillary: 99 mg/dL (ref 70–99)
Glucose-Capillary: 110 mg/dL — ABNORMAL HIGH (ref 70–99)
Glucose-Capillary: 108 mg/dL — ABNORMAL HIGH (ref 70–99)

## 2022-03-26 LAB — BASIC METABOLIC PANEL
Anion gap: 8 (ref 5–15)
BUN: 17 mg/dL (ref 6–20)
CO2: 27 mmol/L (ref 22–32)
Calcium: 9.3 mg/dL (ref 8.9–10.3)
Chloride: 105 mmol/L (ref 98–111)
Creatinine, Ser: 0.8 mg/dL (ref 0.61–1.24)
GFR, Estimated: >60 mL/min (ref >60)
Glucose, Bld: 97 mg/dL (ref 70–99)
Potassium: 3.3 mmol/L — ABNORMAL LOW (ref 3.5–5.1)
Sodium: 140 mmol/L (ref 135–145)

## 2022-03-26 MED ORDER — POTASSIUM CHLORIDE 20 MEQ PO PACK
40.0000 meq | PACK | ORAL | Status: AC
  Administered 2022-03-26 (×2): 40 meq
  Filled 2022-03-26 (×2): qty 2

## 2022-03-26 MED ORDER — GLYCOPYRROLATE 0.2 MG/ML IJ SOLN
0.3000 mg | Freq: Three times a day (TID) | INTRAMUSCULAR | Status: DC
  Administered 2022-03-26 – 2022-03-29 (×11): 0.3 mg via INTRAVENOUS
  Filled 2022-03-26 (×11): qty 2

## 2022-03-26 MED ORDER — ALBUTEROL SULFATE (2.5 MG/3ML) 0.083% IN NEBU
2.5000 mg | INHALATION_SOLUTION | Freq: Two times a day (BID) | RESPIRATORY_TRACT | Status: DC
  Administered 2022-03-26 – 2022-03-28 (×5): 2.5 mg via RESPIRATORY_TRACT
  Filled 2022-03-26 (×5): qty 3

## 2022-03-26 NOTE — Progress Notes (Signed)
Patient ID: Carl Carpenter, male   DOB: 11-30-1986, 36 y.o.   MRN: [id number] ?Follow up - Trauma Critical Care ?  ?Patient Details:  ?  ?Carl Carpenter is an 36 y.o. male. ? ?Lines/tubes ?: ?Gastrostomy/Enterostomy Gastrostomy 24 Fr. LUQ (Active)  ?Surrounding Skin Reddened 03/25/22 2000  ?Tube Status Clamped 03/25/22 2000  ?Drainage Appearance Owens Shark 03/25/22 2000  ?Dressing Status New drainage 03/25/22 2000  ?Dressing Intervention Dressing changed 03/25/22 2000  ?Dressing Type Abdominal Binder;Split gauze 03/25/22 2000  ?G Port Intake (mL) 60 ml 03/25/22 0300  ?   ?External Urinary Catheter (Active)  ?Psychologist, sport and exercise Dedicated Suction Canister 03/25/22 2000  ?Suction (Verified suction is between 40-80 mmHg) Yes 03/25/22 2000  ?Securement Method Securing device (Describe) 03/25/22 2000  ?Site Assessment Clean, Dry, Intact 03/25/22 2000  ?Intervention Male External Urinary Catheter Replaced 03/23/22 2000  ?Output (mL) 650 mL 03/26/22 0600  ?   ?Fecal Management System (Active)  ?Does patient meet criteria for removal? No 03/25/22 2000  ?Daily care Skin around tube assessed;Flushed tube with 32m water (document as intake) 03/25/22 2000  ?Output (mL) 100 mL 03/26/22 0600  ?Intake (mL) 30 mL 03/25/22 0800  ? ? ?Microbiology/Sepsis markers: ?Results for orders placed or performed during the hospital encounter of 03/10/22  ?Resp Panel by RT-PCR (Flu A&B, Covid) Nasopharyngeal Swab     Status: None  ? Collection Time: 03/10/22  9:17 PM  ? Specimen: Nasopharyngeal Swab; Nasopharyngeal(NP) swabs in vial transport medium  ?Result Value Ref Range Status  ? SARS Coronavirus 2 by RT PCR NEGATIVE NEGATIVE Final  ?  Comment: (NOTE) ?SARS-CoV-2 target nucleic acids are NOT DETECTED. ? ?The SARS-CoV-2 RNA is generally detectable in upper respiratory ?specimens during the acute phase of infection. The lowest ?concentration of SARS-CoV-2 viral copies this assay can detect is ?138 copies/mL. A negative result does not preclude  SARS-Cov-2 ?infection and should not be used as the sole basis for treatment or ?other patient management decisions. A negative result may occur with  ?improper specimen collection/handling, submission of specimen other ?than nasopharyngeal swab, presence of viral mutation(s) within the ?areas targeted by this assay, and inadequate number of viral ?copies(<138 copies/mL). A negative result must be combined with ?clinical observations, patient history, and epidemiological ?information. The expected result is Negative. ? ?Fact Sheet for Patients:  ?hEntrepreneurPulse.com.au? ?Fact Sheet for Healthcare Providers:  ?hIncredibleEmployment.be? ?This test is no t yet approved or cleared by the [place] and  ?has been authorized for detection and/or diagnosis of SARS-CoV-2 by ?FDA under an Emergency Use Authorization (EUA). This EUA will remain  ?in effect (meaning this test can be used) for the duration of the ?COVID-19 declaration under Section 564(b)(1) of the Act, 21 ?U.S.C.section 360bbb-3(b)(1), unless the authorization is terminated  ?or revoked sooner.  ? ? ?  ? Influenza A by PCR NEGATIVE NEGATIVE Final  ? Influenza B by PCR NEGATIVE NEGATIVE Final  ?  Comment: (NOTE) ?The Xpert Xpress SARS-CoV-2/FLU/RSV plus assay is intended as an aid ?in the diagnosis of influenza from Nasopharyngeal swab specimens and ?should not be used as a sole basis for treatment. Nasal washings and ?aspirates are unacceptable for Xpert Xpress SARS-CoV-2/FLU/RSV ?testing. ? ?Fact Sheet for Patients: ?hEntrepreneurPulse.com.au? ?Fact Sheet for Healthcare Providers: ?hIncredibleEmployment.be? ?This test is not yet approved or cleared by the [place] and ?has been authorized for detection and/or diagnosis of SARS-CoV-2 by ?FDA under an Emergency Use Authorization (EUA). This EUA will remain ?in effect (  meaning this test can be used) for the duration of  the ?COVID-19 declaration under Section 564(b)(1) of the Act, 21 U.S.C. ?section 360bbb-3(b)(1), unless the authorization is terminated or ?revoked. ? ?Performed at [facility], [place] [address] [place], [place] ?[address] ?  ?MRSA Next Gen by PCR, Nasal     Status: None  ? Collection Time: 03/10/22 11:10 PM  ? Specimen: Nasal Mucosa; Nasal Swab  ?Result Value Ref Range Status  ? MRSA by PCR Next Gen NOT DETECTED NOT DETECTED Final  ?  Comment: (NOTE) ?The GeneXpert MRSA Assay (FDA approved for NASAL specimens only), ?is one component of a comprehensive MRSA colonization surveillance ?program. It is not intended to diagnose MRSA infection nor to guide ?or monitor treatment for MRSA infections. ?Test performance is not FDA approved in patients less than 2 years ?old. ?Performed at [facility], [place] [address] [name], [place] ?[address] ?  ?Culture, Respiratory w Gram Stain     Status: None  ? Collection Time: 03/13/22  3:36 PM  ? Specimen: Tracheal Aspirate; Respiratory  ?Result Value Ref Range Status  ? Specimen Description TRACHEAL ASPIRATE  Final  ? Special Requests NONE  Final  ? Gram Stain   Final  ?  FEW WBC PRESENT,BOTH PMN AND MONONUCLEAR ?MODERATE GRAM VARIABLE ROD ?FEW GRAM POSITIVE COCCI ?RARE BUDDING YEAST SEEN ?  ? Culture   Final  ?  FEW Normal respiratory flora-no Staph aureus or Pseudomonas seen ?Performed at [facility], [place] [address] [place], [place] [address] ?  ? Report Status 03/15/2022 FINAL  Final  ?Culture, Respiratory w Gram Stain     Status: None  ? Collection Time: 03/18/22 12:40 PM  ? Specimen: Bronchoalveolar Lavage; Respiratory  ?Result Value Ref Range Status  ? Specimen Description BRONCHIAL ALVEOLAR LAVAGE  Final  ? Special Requests Normal  Final  ? Gram Stain NO WBC SEEN ?NO ORGANISMS SEEN ?  Final  ? Culture   Final  ?  RARE Normal respiratory flora-no Staph aureus or Pseudomonas seen ?Performed at [facility], [place] [address], [place], [place]  [address] ?  ? Report Status 03/21/2022 FINAL  Final  ? ? ?Anti-infectives:  ?Anti-infectives (From admission, onward)  ? ? Start     Dose/Rate Route Frequency Ordered Stop  ? 03/14/22 1015  ceFEPIme (MAXIPIME) 2 g in sodium chloride 0.9 % 100 mL IVPB  Status:  Discontinued       ? 2 g ?200 mL/hr over 30 Minutes Intravenous Every 12 hours 03/14/22 0926 03/14/22 0934  ? 03/14/22 1000  ceFEPIme (MAXIPIME) 2 g in sodium chloride 0.9 % 100 mL IVPB  Status:  Discontinued       ? 2 g ?200 mL/hr over 30 Minutes Intravenous Every 8 hours 03/14/22 0935 03/16/22 1017  ? 03/11/22 0915  clindamycin (CLEOCIN) 75 MG/5ML solution 150 mg       ? 150 mg Per Tube Every 8 hours 03/11/22 0823 03/15/22 2131  ? 03/10/22 2215  ceFAZolin (ANCEF) IVPB 2g/100 mL premix       ? 2 g ?200 mL/hr over 30 Minutes Intravenous  Once 03/10/22 2202 03/10/22 2307  ? ?  ? ? ? ?Consults: ?Treatment Team:  ?Earnie Larsson, MD  ? ? ?Studies: ? ? ? ?Events: ? ?Subjective:  ?  ?Overnight Issues:  ? ?Objective:  ?Vital signs for last 24 hours: ?Temp:  [98.1 ?F (36.7 ?C)-99.8 ?F (37.7 ?C)] 98.1 ?F (36.7 ?C) (04/06  0800) ?Pulse Rate:  [80-132] 112 (04/06 0809) ?Resp:  [14-58] 27 (04/06 0809) ?BP: (103-159)/(73-105) 159/105 (04/06 0800) ?SpO2:  [94 %-100 %] 98 % (04/06 0809) ?FiO2 (%):  [40 %] 40 % (04/06 0856) ? ?Hemodynamic parameters for last 24 hours: ?  ? ?Intake/Output from previous day: ?04/05 0701 - 04/06 0700 ?In: 1006.6 [I.V.:292.4; NG/GT:383.3; IV Piggyback:300.9] ?Out: 1125 [Urine:900; Stool:225]  ?Intake/Output this shift: ?Total I/O ?In: 12 [I.V.:12] ?Out: -  ? ?Vent settings for last 24 hours: ?Vent Mode: PRVC ?FiO2 (%):  [40 %] 40 % ?Set Rate:  [20 bmp] 20 bmp ?Vt Set:  [710 mL] 710 mL ?PEEP:  [5 cmH20] 5 cmH20 ?Pressure Support:  [12 cmH20-15 cmH20] 12 cmH20 ?Plateau Pressure:  [18 cmH20-19 cmH20] 18 cmH20 ? ?Physical Exam:  ?General: on vent ?Neuro: ? F/C RUE ?HEENT/Neck: trach, a lot of secretions ?Resp: rhonchi bilaterally ?CVS: RRR ?GI: soft, G  tube site some blood clots but not actively bleeding ?Extremities: mild edema ? ?Results for orders placed or performed during the hospital encounter of 03/10/22 (from the past 24 hour(s))  ?Glucose, capillary

## 2022-03-26 NOTE — Progress Notes (Signed)
Attempted to see pt this am. Nursing stated pt bleeding around peg site and coughing excessively.  Asked to check back later. Will attempt back as schedule allows.  ?Tory Emerald, OTR/L ?[phone] ?

## 2022-03-26 NOTE — Progress Notes (Signed)
PT Cancellation Note ? ?Patient Details ?Name: Carl Carpenter ?MRN: [id number] ?DOB: [birth year] ? ? ?Cancelled Treatment:    Reason Eval/Treat Not Completed: Medical issues which prohibited therapy. Pt continues to cough with stimulation and has been bleeding from PEG site. PT will follow up at a later time. ? ? ?Arlyss Gandy ?03/26/2022, 12:23 PM ?
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
## 2022-03-26 NOTE — TOC CAGE-AID Note (Signed)
Transition of Care (TOC) - CAGE-AID Screening ? ? ?Patient Details  ?Name: Carl Carpenter ?MRN: [id number] ?Date of Birth: 01/31/1986 ? ?Transition of Care (TOC) CM/SW Contact:    ?Carl Carpenter, LCSWA ?Phone Number: ?03/26/2022, 12:02 PM ? ? ?Clinical Narrative: ?Pt is unable to participate in Cage Aid. ?Pt is intubated and unarousable. ? ?Insurance underwriter, MSW, LCSW-A ?Pronouns:  She/Her/Hers ?[facility] ?Clinical Social Worker ?Direct Number:  [phone] ?[email or web address] ? ?CAGE-AID Screening: ?Substance Abuse Screening unable to be completed due to: : Patient unable to participate ? ?  ?  ?  ?  ?  ? ?  ? ?  ? ? ? ? ? ? ?

## 2022-03-26 NOTE — Progress Notes (Signed)
Trauma Event Note ? ? ?TRN at bedside to round. Remains intubated, does not follow commands on my assessment. PEG tube leaking dark brown fluids from site. Family at bedside. Checked in with primary nurse, no needs at this time. ? ?Last imported Vital Signs ?BP 128/81   Pulse (!) 103   Temp 98.8 ?F (37.1 ?C) (Oral)   Resp 20   Ht 6\' 5"  (1.956 m)   Wt 258 lb 9.6 oz (117.3 kg)   SpO2 97%   BMI 30.67 kg/m?  ? ?Trending CBC ?Recent Labs  ?  03/24/22 ?[id number] 03/25/22 ?[id number] 03/26/22 ?[id number]  ?WBC 16.9* 17.6* 11.3*  ?HGB 10.8* 10.1* 10.8*  ?HCT 33.8* 31.2* 33.5*  ?PLT 356 409* 436*  ? ? ?Trending Coag's ?No results for input(s): APTT, INR in the last 72 hours. ? ?Trending BMET ?Recent Labs  ?  03/24/22 ?[id number] 03/25/22 ?[id number] 03/26/22 ?[id number]  ?NA 142 139 140  ?K 3.6 3.3* 3.3*  ?CL 104 103 105  ?CO2 26 27 27   ?BUN 23* 19 17  ?CREATININE 0.72 0.68 0.80  ?GLUCOSE 101* 109* 97  ? ? ? ? ?[place]  ?Trauma Response RN ? ?Please call TRN at [phone] for further assistance. ? ? ?  ?

## 2022-03-26 NOTE — Progress Notes (Signed)
SLP Cancellation Note ? ?Patient Details ?Name: Carl Carpenter ?MRN: [id number] ?DOB: May 17, 1986 ? ? ?Cancelled treatment:       Reason Eval/Treat Not Completed: Patient not medically ready. Checked in with RN, no plans for ATC today.  ? ? ?Saudia Smyser, Riley Nearing ?03/26/2022, 10:24 AM ?

## 2022-03-27 LAB — BASIC METABOLIC PANEL
Anion gap: 10 (ref 5–15)
BUN: 18 mg/dL (ref 6–20)
CO2: 25 mmol/L (ref 22–32)
Calcium: 9.5 mg/dL (ref 8.9–10.3)
Chloride: 106 mmol/L (ref 98–111)
Creatinine, Ser: 0.86 mg/dL (ref 0.61–1.24)
GFR, Estimated: >60 mL/min (ref >60)
Glucose, Bld: 90 mg/dL (ref 70–99)
Potassium: 3.7 mmol/L (ref 3.5–5.1)
Sodium: 141 mmol/L (ref 135–145)

## 2022-03-27 LAB — CBC
HCT: 32.9 % — ABNORMAL LOW (ref 39.0–52.0)
Hemoglobin: 11 g/dL — ABNORMAL LOW (ref 13.0–17.0)
MCH: 31.3 pg (ref 26.0–34.0)
MCHC: 33.4 g/dL (ref 30.0–36.0)
MCV: 93.7 fL (ref 80.0–100.0)
Platelets: 412 10*3/uL — ABNORMAL HIGH (ref 150–400)
RBC: 3.51 MIL/uL — ABNORMAL LOW (ref 4.22–5.81)
RDW: 11.9 % (ref 11.5–15.5)
WBC: 12.1 10*3/uL — ABNORMAL HIGH (ref 4.0–10.5)
nRBC: 0 % (ref 0.0–0.2)

## 2022-03-27 LAB — GLUCOSE, CAPILLARY
Glucose-Capillary: 100 mg/dL — ABNORMAL HIGH (ref 70–99)
Glucose-Capillary: 106 mg/dL — ABNORMAL HIGH (ref 70–99)
Glucose-Capillary: 103 mg/dL — ABNORMAL HIGH (ref 70–99)
Glucose-Capillary: 117 mg/dL — ABNORMAL HIGH (ref 70–99)
Glucose-Capillary: 107 mg/dL — ABNORMAL HIGH (ref 70–99)
Glucose-Capillary: 117 mg/dL — ABNORMAL HIGH (ref 70–99)

## 2022-03-27 MED ORDER — ZINC OXIDE 11.3 % EX CREA
TOPICAL_CREAM | Freq: Two times a day (BID) | CUTANEOUS | Status: DC
  Administered 2022-03-27 – 2022-05-07 (×11): 1 via TOPICAL
  Filled 2022-03-27 (×2): qty 56

## 2022-03-27 MED ORDER — ACETAMINOPHEN 160 MG/5ML PO SOLN
500.0000 mg | Freq: Four times a day (QID) | ORAL | Status: DC | PRN
  Administered 2022-03-27 – 2022-03-30 (×4): 500 mg
  Filled 2022-03-27 (×4): qty 20.3

## 2022-03-27 NOTE — Progress Notes (Signed)
Patient ID: Carl Carpenter, male   DOB: 1986-02-10, 36 y.o.   MRN: [id number] ?Follow up - Trauma Critical Care ?  ?Patient Details:  ?  ?Carl Carpenter is an 36 y.o. male. ? ?Lines/tubes ?: ?Gastrostomy/Enterostomy Gastrostomy 24 Fr. LUQ (Active)  ?Surrounding Skin Reddened 03/25/22 2000  ?Tube Status Clamped 03/25/22 2000  ?Drainage Appearance Owens Shark 03/25/22 2000  ?Dressing Status New drainage 03/25/22 2000  ?Dressing Intervention Dressing changed 03/25/22 2000  ?Dressing Type Abdominal Binder;Split gauze 03/25/22 2000  ?G Port Intake (mL) 60 ml 03/25/22 0300  ?   ?External Urinary Catheter (Active)  ?Psychologist, sport and exercise Dedicated Suction Canister 03/25/22 2000  ?Suction (Verified suction is between 40-80 mmHg) Yes 03/25/22 2000  ?Securement Method Securing device (Describe) 03/25/22 2000  ?Site Assessment Clean, Dry, Intact 03/25/22 2000  ?Intervention Male External Urinary Catheter Replaced 03/23/22 2000  ?Output (mL) 650 mL 03/26/22 0600  ?   ?Fecal Management System (Active)  ?Does patient meet criteria for removal? No 03/25/22 2000  ?Daily care Skin around tube assessed;Flushed tube with 85m water (document as intake) 03/25/22 2000  ?Output (mL) 100 mL 03/26/22 0600  ?Intake (mL) 30 mL 03/25/22 0800  ? ? ?Microbiology/Sepsis markers: ?Results for orders placed or performed during the hospital encounter of 03/10/22  ?Resp Panel by RT-PCR (Flu A&B, Covid) Nasopharyngeal Swab     Status: None  ? Collection Time: 03/10/22  9:17 PM  ? Specimen: Nasopharyngeal Swab; Nasopharyngeal(NP) swabs in vial transport medium  ?Result Value Ref Range Status  ? SARS Coronavirus 2 by RT PCR NEGATIVE NEGATIVE Final  ?  Comment: (NOTE) ?SARS-CoV-2 target nucleic acids are NOT DETECTED. ? ?The SARS-CoV-2 RNA is generally detectable in upper respiratory ?specimens during the acute phase of infection. The lowest ?concentration of SARS-CoV-2 viral copies this assay can detect is ?138 copies/mL. A negative result does not preclude  SARS-Cov-2 ?infection and should not be used as the sole basis for treatment or ?other patient management decisions. A negative result may occur with  ?improper specimen collection/handling, submission of specimen other ?than nasopharyngeal swab, presence of viral mutation(s) within the ?areas targeted by this assay, and inadequate number of viral ?copies(<138 copies/mL). A negative result must be combined with ?clinical observations, patient history, and epidemiological ?information. The expected result is Negative. ? ?Fact Sheet for Patients:  ?hEntrepreneurPulse.com.au? ?Fact Sheet for Healthcare Providers:  ?hIncredibleEmployment.be? ?This test is no t yet approved or cleared by the [place] and  ?has been authorized for detection and/or diagnosis of SARS-CoV-2 by ?FDA under an Emergency Use Authorization (EUA). This EUA will remain  ?in effect (meaning this test can be used) for the duration of the ?COVID-19 declaration under Section 564(b)(1) of the Act, 21 ?U.S.C.section 360bbb-3(b)(1), unless the authorization is terminated  ?or revoked sooner.  ? ? ?  ? Influenza A by PCR NEGATIVE NEGATIVE Final  ? Influenza B by PCR NEGATIVE NEGATIVE Final  ?  Comment: (NOTE) ?The Xpert Xpress SARS-CoV-2/FLU/RSV plus assay is intended as an aid ?in the diagnosis of influenza from Nasopharyngeal swab specimens and ?should not be used as a sole basis for treatment. Nasal washings and ?aspirates are unacceptable for Xpert Xpress SARS-CoV-2/FLU/RSV ?testing. ? ?Fact Sheet for Patients: ?hEntrepreneurPulse.com.au? ?Fact Sheet for Healthcare Providers: ?hIncredibleEmployment.be? ?This test is not yet approved or cleared by the [place] and ?has been authorized for detection and/or diagnosis of SARS-CoV-2 by ?FDA under an Emergency Use Authorization (EUA). This EUA will remain ?in effect (  meaning this test can be used) for the duration of  the ?COVID-19 declaration under Section 564(b)(1) of the Act, 21 U.S.C. ?section 360bbb-3(b)(1), unless the authorization is terminated or ?revoked. ? ?Performed at [facility], [place] [address] [place], [place] ?[address] ?  ?MRSA Next Gen by PCR, Nasal     Status: None  ? Collection Time: 03/10/22 11:10 PM  ? Specimen: Nasal Mucosa; Nasal Swab  ?Result Value Ref Range Status  ? MRSA by PCR Next Gen NOT DETECTED NOT DETECTED Final  ?  Comment: (NOTE) ?The GeneXpert MRSA Assay (FDA approved for NASAL specimens only), ?is one component of a comprehensive MRSA colonization surveillance ?program. It is not intended to diagnose MRSA infection nor to guide ?or monitor treatment for MRSA infections. ?Test performance is not FDA approved in patients less than 2 years ?old. ?Performed at [facility], [place] [address], [place], [place] ?[address] ?  ?Culture, Respiratory w Gram Stain     Status: None  ? Collection Time: 03/13/22  3:36 PM  ? Specimen: Tracheal Aspirate; Respiratory  ?Result Value Ref Range Status  ? Specimen Description TRACHEAL ASPIRATE  Final  ? Special Requests NONE  Final  ? Gram Stain   Final  ?  FEW WBC PRESENT,BOTH PMN AND MONONUCLEAR ?MODERATE GRAM VARIABLE ROD ?FEW GRAM POSITIVE COCCI ?RARE BUDDING YEAST SEEN ?  ? Culture   Final  ?  FEW Normal respiratory flora-no Staph aureus or Pseudomonas seen ?Performed at [facility], [place] [address] [place], [place] [address] ?  ? Report Status 03/15/2022 FINAL  Final  ?Culture, Respiratory w Gram Stain     Status: None  ? Collection Time: 03/18/22 12:40 PM  ? Specimen: Bronchoalveolar Lavage; Respiratory  ?Result Value Ref Range Status  ? Specimen Description BRONCHIAL ALVEOLAR LAVAGE  Final  ? Special Requests Normal  Final  ? Gram Stain NO WBC SEEN ?NO ORGANISMS SEEN ?  Final  ? Culture   Final  ?  RARE Normal respiratory flora-no Staph aureus or Pseudomonas seen ?Performed at [facility], [place] [address] [place], [place]  [address] ?  ? Report Status 03/21/2022 FINAL  Final  ? ? ?Anti-infectives:  ?Anti-infectives (From admission, onward)  ? ? Start     Dose/Rate Route Frequency Ordered Stop  ? 03/14/22 1015  ceFEPIme (MAXIPIME) 2 g in sodium chloride 0.9 % 100 mL IVPB  Status:  Discontinued       ? 2 g ?200 mL/hr over 30 Minutes Intravenous Every 12 hours 03/14/22 0926 03/14/22 0934  ? 03/14/22 1000  ceFEPIme (MAXIPIME) 2 g in sodium chloride 0.9 % 100 mL IVPB  Status:  Discontinued       ? 2 g ?200 mL/hr over 30 Minutes Intravenous Every 8 hours 03/14/22 0935 03/16/22 1017  ? 03/11/22 0915  clindamycin (CLEOCIN) 75 MG/5ML solution 150 mg       ? 150 mg Per Tube Every 8 hours 03/11/22 0823 03/15/22 2131  ? 03/10/22 2215  ceFAZolin (ANCEF) IVPB 2g/100 mL premix       ? 2 g ?200 mL/hr over 30 Minutes Intravenous  Once 03/10/22 2202 03/10/22 2307  ? ?  ? ? ? ?Consults: ?Treatment Team:  ?Earnie Larsson, MD  ? ? ?Studies: ? ? ? ?Events: ? ?Subjective:  ?  ?Overnight Issues:  ? ?Objective:  ?Vital signs for last 24 hours: ?Temp:  [98.1 ?F (36.7 ?C)-99.2 ?F (37.3 ?C)] 98.7 ?F (37.1 ?C) (04/07  0400) ?Pulse Rate:  [83-132] 119 (04/07 0600) ?Resp:  [20-58] 26 (04/07 0600) ?BP: (97-159)/(66-105) 130/81 (04/07 0600) ?SpO2:  [92 %-99 %] 95 % (04/07 0600) ?FiO2 (%):  [40 %] 40 % (04/07 0400) ? ?Hemodynamic parameters for last 24 hours: ?  ? ?Intake/Output from previous day: ?04/06 0701 - 04/07 0700 ?In: 417.8 [I.V.:317.8; IV Piggyback:100] ?Out: 1450 [Urine:1450]  ?Intake/Output this shift: ?No intake/output data recorded. ? ?Vent settings for last 24 hours: ?Vent Mode: PRVC ?FiO2 (%):  [40 %] 40 % ?Set Rate:  [20 bmp] 20 bmp ?Vt Set:  [710 mL] 710 mL ?PEEP:  [5 cmH20] 5 cmH20 ?Pressure Support:  [12 cmH20] 12 cmH20 ?Plateau Pressure:  [17 cmH20-20 cmH20] 20 cmH20 ? ?Physical Exam:  ?General: on vent ?Neuro: ? F/C RUE ?HEENT/Neck: trach, a lot of secretions ?Resp: rhonchi bilaterally ?CVS: RRR ?GI: soft, G tube site some blood clots but not actively  bleeding, rash around g-tube site developing ?Extremities: mild edema ? ?Results for orders placed or performed during the hospital encounter of 03/10/22 (from the past 24 hour(s))  ?Glucose, capillary     Status

## 2022-03-27 NOTE — Progress Notes (Signed)
Patient Carl Carpenter      DOB: 12-12-1986      SLH:[id number] ? ? ? ?  ?Palliative Medicine Team ? ? ? ?Subjective: Bedside symptom check. No family or visitors present, during quiet time in ICU.  ? ? ?Physical exam: Patient resting in bed with eyes closed. Patient breathing even and non-labored on ventilator support through trach. Patient slightly diaphoretic, skin warm in all extremities. Abdominal binder applied with some drainage noted around PEG dressing. Patient without physical or non-verbal signs of pain or discomfort at this time.  ? ? ?Assessment and plan: PMT continues to be available for family and patient support. Will try to visit again when not during quiet time, to better be available for loved ones. Bedside RN Amy provided update with some improvement to include intermittently able to follow commands with thumbs up. This RN did not attempt to elicit these responses during quiet time to respect patient healing and rest. Will continue to follow for any changes or advances.  ? ? ?Thank you for allowing the Palliative Medicine Team to assist in the care of this patient. ?  ?  ?Shelda Jakes, MSN, RN ?Palliative Medicine Team ?Team Phone: [phone]  ?This phone is monitored 7a-7p, please reach out to attending physician outside of these hours for urgent needs.   ?

## 2022-03-27 NOTE — Progress Notes (Signed)
Subjective: ?The patient is intubated and sedated.  He is in no apparent distress.  He is diaphoretic.  His parents are at the bedside. ? ?Objective: ?Vital signs in last 24 hours: ?Temp:  [98.7 ?F (37.1 ?C)-101 ?F (38.3 ?C)] 101 ?F (38.3 ?C) (04/07 0800) ?Pulse Rate:  [83-129] 110 (04/07 0800) ?Resp:  [17-29] 17 (04/07 0800) ?BP: (97-156)/(66-95) 128/82 (04/07 0800) ?SpO2:  [92 %-99 %] 98 % (04/07 0805) ?FiO2 (%):  [40 %] 40 % (04/07 0737) ?Estimated body mass index is 30.67 kg/m? as calculated from the following: ?  Height as of this encounter: 6\' 5"  (1.956 m). ?  Weight as of this encounter: 117.3 kg. ? ? ?Intake/Output from previous day: ?04/06 0701 - 04/07 0700 ?In: 438.7 [I.V.:338.7; IV Piggyback:100] ?Out: 1450 [Urine:1450] ?Intake/Output this shift: ?Total I/O ?In: 20.9 [I.V.:20.9] ?Out: -  ? ?Physical exam the patient's pupils are myotic and equal.  There is weak flexion with painful stimuli bilaterally. ? ?Lab Results: ?Recent Labs  ?  03/26/22 ?0227 03/27/22 ?0242  ?WBC 11.3* 12.1*  ?HGB 10.8* 11.0*  ?HCT 33.5* 32.9*  ?PLT 436* 412*  ? ?BMET ?Recent Labs  ?  03/26/22 ?0227 03/27/22 ?0242  ?NA 140 141  ?K 3.3* 3.7  ?CL 105 106  ?CO2 27 25  ?GLUCOSE 97 90  ?BUN 17 18  ?CREATININE 0.80 0.86  ?CALCIUM 9.3 9.5  ? ? ?Studies/Results: ?No results found. ? ?Assessment/Plan: ?Traumatic brain injury: Discussed the situation with the patient's parents.  I have answered all her questions.  We will continue supportive care. ? LOS: 17 days  ? ? ? ?05/27/22 ?03/27/2022, 9:42 AM ? ? ? ? ? ?

## 2022-03-27 NOTE — Progress Notes (Addendum)
? ? ?Referring Physician(s): ?Louanna Raw MD ? ?Supervising Physician: Jacqulynn Cadet ? ?Patient Status:  [facility] - In-pt ? ?Chief Complaint: ? ?Leaking G-tube ? ?Subjective: ? ?Reports of g-tube leaking when patient is coughing.  Yesterday, there was bleeding from site as well ?Family at bedside ? ?Allergies: ?Peanut-containing drug products and Penicillins ? ?Medications: ?Prior to Admission medications   ?Medication Sig Start Date End Date Taking? Authorizing Provider  ?acetaminophen (TYLENOL) 500 MG tablet Take 1,000 mg by mouth every 6 (six) hours as needed for moderate pain or headache.   Yes [provider]  ?ibuprofen (ADVIL) 200 MG tablet Take 800 mg by mouth every 6 (six) hours as needed for moderate pain or headache.   Yes [provider]  ? ? ? ?Vital Signs: ?BP 114/75   Pulse 98   Temp (!) 101 ?F (38.3 ?C) (Axillary) Comment: tylenol given  Resp 16   Ht 6\' 5"  (1.956 m)   Wt 258 lb 9.6 oz (117.3 kg)   SpO2 98%   BMI 30.67 kg/m?  ? ?Physical Exam ?Vitals reviewed.  ?Constitutional:   ?   Appearance: He is ill-appearing and diaphoretic.  ?   Interventions: He is sedated and intubated.  ?Pulmonary:  ?   Effort: He is intubated.  ?Abdominal:  ?   General: A surgical scar is present.  ?   Palpations: Abdomen is soft.  ?   Comments: G-tube in tract and bumper 2-3cm away from skin surface.  Red moist rash surrounding g-tube. Skin intact. No active bleeding.  No granulation tissue of tract visible.  ? ? ?Imaging: ?CT ABDOMEN PELVIS W CONTRAST ? ?Result Date: 03/23/2022 ?CLINICAL DATA:  Assessed G-tube placement EXAM: CT ABDOMEN AND PELVIS WITH CONTRAST TECHNIQUE: Multidetector CT imaging of the abdomen and pelvis was performed using the standard protocol following bolus administration of intravenous contrast. RADIATION DOSE REDUCTION: This exam was performed according to the departmental dose-optimization program which includes automated exposure control, adjustment of the mA and/or  kV according to patient size and/or use of iterative reconstruction technique. CONTRAST:  138mL OMNIPAQUE IOHEXOL 300 MG/ML  SOLN COMPARISON:  CT 03/10/2022, 12/23/2021 FINDINGS: Lower chest: Lung bases demonstrate progressed consolidations and airspace disease in the visible lower lobes with increased air bronchograms. Normal cardiac size. Hepatobiliary: No focal liver abnormality is seen. No gallstones, gallbladder wall thickening, or biliary dilatation. Pancreas: Unremarkable. No pancreatic ductal dilatation or surrounding inflammatory changes. Spleen: Normal in size without focal abnormality. Adrenals/Urinary Tract: Adrenal glands are unremarkable. Kidneys are normal, without renal calculi, focal lesion, or hydronephrosis. Bladder is unremarkable. Stomach/Bowel: Interim placement of gastrostomy tube, the button is within the anterior abdominal wall at the level of the superficial rectus muscles. Considerable surrounding edema and soft tissue stranding. Ill-defined low-density likely fluid-filled tract at the level of the gastrostomy button, to the anterior aspect of the stomach. Remainder of the small bowel is nondilated. There is fluid in the colon. No acute wall thickening. A rectal catheter is present Vascular/Lymphatic: Nonaneurysmal aorta. Multiple small retroperitoneal lymph nodes. Reproductive: Negative for mass Other: No free air. No significant pelvic effusion. Clips in the right lower quadrant. Musculoskeletal: No acute or significant osseous findings. IMPRESSION: 1. Malpositioned gastrostomy tube with button visualized in the superficial left rectus muscle/anterior abdominal wall musculature. There is considerable surrounding soft tissue edema and inflammation. Ill-defined soft tissue density and probable fluid-filled tract extending from the left rectus to the anterior wall of the stomach. No free air. 2. Worsening airspace disease at the  bilateral lower lobes. These results will be called to the  ordering clinician or representative by the Radiologist Assistant, and communication documented in the PACS or [employer]. Electronically Signed   By: Donavan Foil M.D.   On: 03/23/2022 19:21  ? ?IR Replc Gastro/Colonic Tube Percut W/Fluoro ? ?Result Date: 03/24/2022 ?INDICATION: Patient with history of significant traumatic brain injury with gastrosotmy tube for nutritional access. Placeholder in situ. Patient presents for gastrostomy replacement. EXAM: PUSH GASTROSTOMY TUBE PLACEMENT COMPARISON:  None MEDICATIONS: No; Antibiotics were administered within 1 hour of the procedure. CONTRAST:  10 mL of Isovue 300 administered into the gastric lumen. ANESTHESIA/SEDATION: Moderate (conscious) sedation was not employed during this procedure FLUOROSCOPY TIME:  [id number]  MGy) COMPLICATIONS: None immediate complications. The introducer was inadvertency pulled into the gastric lumen. This will come out on it own through the intestinal system. PROCEDURE: Informed written consent was obtained from the patient and/or patient's representative following explanation of the procedure, risks, benefits and alternatives. A time out was performed prior to the initiation of the procedure. Maximal barrier sterile technique utilized including caps, mask, sterile gowns, sterile gloves, and hand hygiene. The LEFT upper quadrant was sterilely prepped and draped. Under intermittent fluoroscopic guidance, a stiff guide wire was placed along side the foley placeholder and the new 24 Fr balloon retention gastrostomy tube was replaced over the wire. The retention balloon was insufflated with a mixture of dilute saline and pulled taut against the anterior wall of the stomach. The external disc was cinched. Contrast injection confirms positioning within the stomach. Spot radiographic images confirmed placement within the stomach. The patient tolerated procedure well without immediate post procedural complication. FINDINGS: After successful  fluoroscopic guided placement, the gastrostomy tube is appropriately positioned with internal retention balloon against the ventral aspect of the gastric lumen. IMPRESSION: Successful fluoroscopic insertion of a 24 Fr balloon-retention gastrostomy tube. The gastrostomy tube is available to use at this time. Read by: Rushie Nyhan, NP PLAN: The patient is to return to Vascular Interventional Radiology (VIR) for routine gastrostomy tube exchange within 6 months. Michaelle Birks, MD [facility] Radiology Electronically Signed   By: Michaelle Birks M.D.   On: 03/24/2022 19:14  ? ?DG ABDOMEN PEG TUBE LOCATION ? ?Result Date: 03/23/2022 ?CLINICAL DATA:  Gastrostomy catheter placement EXAM: ABDOMEN - 1 VIEW COMPARISON:  CT 6:32 p.m. FINDINGS: Previously noted button type gastrostomy catheter has been exchanged for a a narrow gauge balloon retention gastrostomy catheter. The retaining balloon of the new gastrostomy catheter appears within the gastric lumen. Instilled contrast opacifies the gastric lumen. No extraluminal extension of contrast save for contrast likely along the surgical dressing superficial to the gastrostomy catheter site. Normal abdominal gas pattern. IMPRESSION: Replacement gastrostomy catheter in appropriate position within the gastric lumen. Electronically Signed   By: Fidela Salisbury M.D.   On: 03/23/2022 22:01  ? ?DG CHEST PORT 1 VIEW ? ?Result Date: 03/24/2022 ?CLINICAL DATA:  Respiratory failure. Endotracheal tube. Trauma patient. EXAM: PORTABLE CHEST 1 VIEW COMPARISON:  Radiographs 03/22/2022 and 03/18/2022.  CT 03/10/2022. FINDINGS: 0525 hours. Tracheostomy is unchanged in position. The heart size and mediastinal contours are stable. There is further improved aeration of the lung bases with improving bibasilar atelectasis. No new airspace disease, significant pleural effusion or pneumothorax identified. The bones appear unchanged. IMPRESSION: Improving  pulmonary aeration bilaterally.  No new findings. Electronically Signed   By: Richardean Sale M.D.   On: 03/24/2022 08:04   ? ?Labs: ? ?CBC: ?Recent  Labs  ?  03/24/22 ?[id number] 03/25/22 ?[id number] 03/26/22 ?[id number] 03/27/22 ?0242  ?
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
## 2022-03-28 LAB — BASIC METABOLIC PANEL
Anion gap: 8 (ref 5–15)
BUN: 22 mg/dL — ABNORMAL HIGH (ref 6–20)
CO2: 27 mmol/L (ref 22–32)
Calcium: 9.4 mg/dL (ref 8.9–10.3)
Chloride: 106 mmol/L (ref 98–111)
Creatinine, Ser: 0.77 mg/dL (ref 0.61–1.24)
GFR, Estimated: >60 mL/min (ref >60)
Glucose, Bld: 119 mg/dL — ABNORMAL HIGH (ref 70–99)
Potassium: 3.6 mmol/L (ref 3.5–5.1)
Sodium: 141 mmol/L (ref 135–145)

## 2022-03-28 LAB — GLUCOSE, CAPILLARY
Glucose-Capillary: 129 mg/dL — ABNORMAL HIGH (ref 70–99)
Glucose-Capillary: 126 mg/dL — ABNORMAL HIGH (ref 70–99)
Glucose-Capillary: 122 mg/dL — ABNORMAL HIGH (ref 70–99)
Glucose-Capillary: 103 mg/dL — ABNORMAL HIGH (ref 70–99)
Glucose-Capillary: 118 mg/dL — ABNORMAL HIGH (ref 70–99)

## 2022-03-28 LAB — CBC
HCT: 33.8 % — ABNORMAL LOW (ref 39.0–52.0)
Hemoglobin: 11 g/dL — ABNORMAL LOW (ref 13.0–17.0)
MCH: 30.6 pg (ref 26.0–34.0)
MCHC: 32.5 g/dL (ref 30.0–36.0)
MCV: 94.2 fL (ref 80.0–100.0)
Platelets: 406 10*3/uL — ABNORMAL HIGH (ref 150–400)
RBC: 3.59 MIL/uL — ABNORMAL LOW (ref 4.22–5.81)
RDW: 11.9 % (ref 11.5–15.5)
WBC: 12.1 10*3/uL — ABNORMAL HIGH (ref 4.0–10.5)
nRBC: 0 % (ref 0.0–0.2)

## 2022-03-28 NOTE — Progress Notes (Signed)
Follow up - Trauma and Critical Care ? ?Patient Details:  ?  ?Carl Carpenter is an 36 y.o. male. ? ?Anti-infectives:  ?Anti-infectives (From admission, onward)  ? ? Start     Dose/Rate Route Frequency Ordered Stop  ? 03/14/22 1015  ceFEPIme (MAXIPIME) 2 g in sodium chloride 0.9 % 100 mL IVPB  Status:  Discontinued       ? 2 g ?200 mL/hr over 30 Minutes Intravenous Every 12 hours 03/14/22 0926 03/14/22 0934  ? 03/14/22 1000  ceFEPIme (MAXIPIME) 2 g in sodium chloride 0.9 % 100 mL IVPB  Status:  Discontinued       ? 2 g ?200 mL/hr over 30 Minutes Intravenous Every 8 hours 03/14/22 0935 03/16/22 1017  ? 03/11/22 0915  clindamycin (CLEOCIN) 75 MG/5ML solution 150 mg       ? 150 mg Per Tube Every 8 hours 03/11/22 0823 03/15/22 2131  ? 03/10/22 2215  ceFAZolin (ANCEF) IVPB 2g/100 mL premix       ? 2 g ?200 mL/hr over 30 Minutes Intravenous  Once 03/10/22 2202 03/10/22 2307  ? ?  ? ? ?Consults: ?Treatment Team:  ?Julio Sicks, MD  ? ?Chief Complaint/Subjective:  ?  ?Overnight Issues: ?G tube tightened with leak improvement, weaned 2 h yesterday ? ?Objective:  ?Vital signs for last 24 hours: ?Temp:  [98.8 ?F (37.1 ?C)-100.8 ?F (38.2 ?C)] 98.9 ?F (37.2 ?C) (04/08 0400) ?Pulse Rate:  [75-114] 84 (04/08 0700) ?Resp:  [16-33] 20 (04/08 0700) ?BP: (96-137)/(65-89) 120/78 (04/08 0700) ?SpO2:  [94 %-99 %] 98 % (04/08 0810) ?FiO2 (%):  [40 %] 40 % (04/08 0810) ? ?Hemodynamic parameters for last 24 hours: ?  ? ?Intake/Output from previous day: ?04/07 0701 - 04/08 0700 ?In: 920.6 [I.V.:392.8; NG/GT:527.8] ?Out: 450 [Urine:450]  ?Intake/Output this shift: ?No intake/output data recorded. ? ?Vent settings for last 24 hours: ?Vent Mode: PSV;CPAP ?FiO2 (%):  [40 %] 40 % ?Set Rate:  [20 bmp] 20 bmp ?Vt Set:  [710 mL] 710 mL ?PEEP:  [5 cmH20] 5 cmH20 ?Pressure Support:  [8 cmH20-12 cmH20] 8 cmH20 ?Plateau Pressure:  [28 cmH20] 28 cmH20 ? ?Physical Exam:  ?Gen: intubated, sedated ?HEENT: trach in place ?Resp: assisted, rhonchi  b/l ?Cardiovascular: RRR ?Abdomen: soft, NT, ND, small amount leakage around tube ?Ext: no edema ?Neuro: GCS 6t, bites down to stimulation ? ? ?Assessment/Plan:  ? ?[facility] ?  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 Jordan Likes, keppra x7d for sz ppx, TBI therapies once extubated. Repeat CT H 3/23 stable. MRI brain 3/27 with microhemorrhages in cortex, corpaus callosum, basal ganglia, midbrain, pons. Dr. Riley Kill with PM&R added amantadine 3/31, increased to 200mg  BID 4/3. Exam is improving some. ?C1 TP fx, occipital condyle fx - per Dr. 6/3, collar ?Acute ventilator dependent respiratory failure - cont PSV trials, on guaifenisen for thick secretions, added mucomyst 4/1, add robinul 4/4,  ?ID - fever/leukocytosis significantly improved ?R ear laceration - S/P repair, completed Clinda x 5d per Dr. 6/4 ?Dislodged PEG - temp foley changed to G tube by IR. Meds per tube today. improved with tightening ?Acute urinary retention - foley out, voiding ?Hyperglycemia - likely from TF, SSI ?FEN - NPO, TF 50  ml/h (goal 80), SLP ?R post tib and peroneal DVT - LMWH, held 4/7, can resume today ?Dispo - ICU, PT/OT, anticipate SNF placement ? ? LOS: 18 days  ? ?Critical Care Total Time*: 35 minutes ? ?6/7 Shakim Faith ?03/28/2022 ? ?*Care during the described time interval was  provided by me and/or other providers on the critical care team.  I have reviewed this patient's available data, including medical history, events of note, physical examination and test results as part of my evaluation. ? ? ? ? ?

## 2022-03-28 NOTE — Progress Notes (Signed)
Inpatient Rehab Admissions Coordinator:  ? ?Per therapy recommendations, patient was screened for CIR candidacy by Megan Salon, MS, CCC-SLP ? Marland Kitchen At this time, Pt. Remains on ventilator and is not yet tolerating OOB  Pt. may have potential to progress to becoming a potential CIR candidate, so CIR admissions team will follow and monitor for medical readiness and participation with therapies and place consult order if Pt. appears to be an appropriate candidate. Please contact me with any questions.  ? ?Megan Salon, MS, CCC-SLP ?Rehab Admissions Coordinator  ?[phone] (celll) ?[phone] (office) ? ? ?

## 2022-03-28 NOTE — Progress Notes (Signed)
Patient ID: Carl Carpenter, male   DOB: 1986/10/04, 36 y.o.   MRN: [id number] ?BP 98/67   Pulse 89   Temp 99.1 ?F (37.3 ?C) (Axillary)   Resp (!) 22   Ht 6\' 5"  (1.956 m)   Wt 117.3 kg   SpO2 97%   BMI 30.67 kg/m?  ?Intubated, sedated ?Not following commands ?Minimal movement to noxious stimuli ?No new recommendations ?

## 2022-03-28 NOTE — Progress Notes (Signed)
?  03/28/22 0807  ?Tracheostomy Shiley Flexible 6 mm Cuffed  ?Placement Date/Time: 03/18/22 0932   Inserted prior to hospital arrival?: (c) Other (Comment)  Placed By: (c) Other (Comment)  Brand: Shiley Flexible  Size (mm): 6 mm  Style: Cuffed  Serial / Lot #: [id number]  Expiration Date: 12/09/25  ?Status Secured  ?Site Assessment Clean  ?Site Care Dressing applied  ?Ties Assessment Clean, Dry, Secure  ?Cuff Pressure (cm H2O) Clear OR 27-39 CmH2O  ?Tracheostomy Equipment at bedside Yes and checklist posted at head of bed  ?Adult Ventilator Settings  ?Vent Type Servo U  ?Humidity HME  ?Vent Mode PSV;CPAP  ?FiO2 (%) 40 %  ?I Time 0.9 Sec(s)  ?Pressure Support 8 cmH20  ?PEEP 5 cmH20  ?Adult Ventilator Measurements  ?Peak Airway Pressure 14 L/min  ?Mean Airway Pressure 11 cmH20  ?Resp Rate Spontaneous 22 br/min  ?Resp Rate Total 22 br/min  ?Spont TV 462 mL  ?Measured Ve 9.9 mL  ?Auto PEEP 0 cmH20  ?Total PEEP 5 cmH20  ?SpO2 97 %  ?Adult Ventilator Alarms  ?Alarms On Y  ?Ve High Alarm 24 L/min  ?Ve Low Alarm 3 L/min  ?Resp Rate High Alarm 38 br/min  ?Resp Rate Low Alarm 10  ?Press High Alarm 45 cmH2O  ?T Apnea 20 sec(s)  ?Daily Weaning Assessment  ?Daily Assessment of Readiness to Wean Wean protocol criteria met (SBT performed)  ?SBT Method CPAP 5 cm H20 and PS 5 cm H20 ?(PS 8)  ?Weaning Start Time [id number]  ?Oral Suctioning/Secretions  ?Suction Type Oral  ?Suction Device Yankauer  ?Secretion Amount Moderate  ?Secretion Color White  ?Secretion Consistency Thick  ?Suction Tolerance Tolerated well  ?Suctioning Adverse Effects None  ?Airway Suctioning/Secretions  ?Suction Type Tracheal  ?Suction Device  Catheter  ?Secretion Amount Small  ?Secretion Color Tan  ?Secretion Consistency Thick  ?Suction Tolerance Tolerated well  ?Suctioning Adverse Effects None  ? ? ?

## 2022-03-29 LAB — GLUCOSE, CAPILLARY
Glucose-Capillary: 106 mg/dL — ABNORMAL HIGH (ref 70–99)
Glucose-Capillary: 109 mg/dL — ABNORMAL HIGH (ref 70–99)
Glucose-Capillary: 114 mg/dL — ABNORMAL HIGH (ref 70–99)
Glucose-Capillary: 114 mg/dL — ABNORMAL HIGH (ref 70–99)
Glucose-Capillary: 116 mg/dL — ABNORMAL HIGH (ref 70–99)
Glucose-Capillary: 119 mg/dL — ABNORMAL HIGH (ref 70–99)
Glucose-Capillary: 128 mg/dL — ABNORMAL HIGH (ref 70–99)

## 2022-03-29 LAB — CBC
HCT: 35.3 % — ABNORMAL LOW (ref 39.0–52.0)
Hemoglobin: 11.3 g/dL — ABNORMAL LOW (ref 13.0–17.0)
MCH: 30.5 pg (ref 26.0–34.0)
MCHC: 32 g/dL (ref 30.0–36.0)
MCV: 95.1 fL (ref 80.0–100.0)
Platelets: 387 10*3/uL (ref 150–400)
RBC: 3.71 MIL/uL — ABNORMAL LOW (ref 4.22–5.81)
RDW: 12 % (ref 11.5–15.5)
WBC: 12.4 10*3/uL — ABNORMAL HIGH (ref 4.0–10.5)
nRBC: 0 % (ref 0.0–0.2)

## 2022-03-29 MED ORDER — ALBUTEROL SULFATE (2.5 MG/3ML) 0.083% IN NEBU
2.5000 mg | INHALATION_SOLUTION | Freq: Four times a day (QID) | RESPIRATORY_TRACT | Status: DC | PRN
  Administered 2022-04-15: 2.5 mg via RESPIRATORY_TRACT
  Filled 2022-03-29 (×2): qty 3
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
## 2022-03-29 NOTE — Progress Notes (Signed)
Patient ID: Carl Carpenter, male   DOB: 01/09/1986, 36 y.o.   MRN: [id number] ?BP (!) 118/107   Pulse (!) 102   Temp 97.9 ?F (36.6 ?C)   Resp (!) 22   Ht 6\' 5"  (1.956 m)   Wt 117.3 kg   SpO2 98%   BMI 30.67 kg/m?  ?Comatose ?No change neurologically ?Sedated, intubated ?Pupils reactive, +cough ?
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
## 2022-03-29 NOTE — Progress Notes (Signed)
RT NOTE: patient placed on 40% ATC.  Currently tolerating well at this time.  Will continue to monitor.  ?

## 2022-03-30 LAB — GLUCOSE, CAPILLARY
Glucose-Capillary: 121 mg/dL — ABNORMAL HIGH (ref 70–99)
Glucose-Capillary: 123 mg/dL — ABNORMAL HIGH (ref 70–99)
Glucose-Capillary: 113 mg/dL — ABNORMAL HIGH (ref 70–99)
Glucose-Capillary: 125 mg/dL — ABNORMAL HIGH (ref 70–99)
Glucose-Capillary: 121 mg/dL — ABNORMAL HIGH (ref 70–99)
Glucose-Capillary: 115 mg/dL — ABNORMAL HIGH (ref 70–99)

## 2022-03-30 LAB — CBC
HCT: 35.7 % — ABNORMAL LOW (ref 39.0–52.0)
Hemoglobin: 11.6 g/dL — ABNORMAL LOW (ref 13.0–17.0)
MCH: 30.9 pg (ref 26.0–34.0)
MCHC: 32.5 g/dL (ref 30.0–36.0)
MCV: 94.9 fL (ref 80.0–100.0)
Platelets: 379 10*3/uL (ref 150–400)
RBC: 3.76 MIL/uL — ABNORMAL LOW (ref 4.22–5.81)
RDW: 12.1 % (ref 11.5–15.5)
WBC: 12.9 10*3/uL — ABNORMAL HIGH (ref 4.0–10.5)
nRBC: 0 % (ref 0.0–0.2)

## 2022-03-30 MED ORDER — GLYCOPYRROLATE 0.2 MG/ML IJ SOLN
0.4000 mg | Freq: Three times a day (TID) | INTRAMUSCULAR | Status: DC
  Administered 2022-03-30 – 2022-05-06 (×114): 0.4 mg via INTRAVENOUS
  Filled 2022-03-30 (×114): qty 2

## 2022-03-30 MED ORDER — SCOPOLAMINE 1 MG/3DAYS TD PT72
1.0000 | MEDICATED_PATCH | TRANSDERMAL | Status: DC
  Administered 2022-03-30 – 2022-04-05 (×3): 1.5 mg via TRANSDERMAL
  Filled 2022-03-30 (×4): qty 1

## 2022-03-30 MED ORDER — ACETAMINOPHEN 160 MG/5ML PO SOLN
1000.0000 mg | Freq: Four times a day (QID) | ORAL | Status: DC
  Administered 2022-03-30 – 2022-04-09 (×35): 1000 mg
  Filled 2022-03-30 (×37): qty 40.6
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
## 2022-03-30 NOTE — Progress Notes (Signed)
Patient [id number] Mathan Destin      DOB: January 11, 1986      [id number] ? ? ? ?  ?Palliative Medicine Team ? ? ? ?Subjective: Bedside symptom check. Father and fiance bedside at time of visit.  ? ? ?Physical exam: Patient resting in bed with eyes closed at time of visit. This RN assisted bedside RN to suction patient after coughing up secretions. Patient's breathing even and non-labored. Patient without physical or non-verbal signs of pain or discomfort at this time. Patient does not open eyes or acknowledge this RN during this visit, not following commands at this time.  ? ? ?Assessment and plan: Family bedside very excited about the patient's transfer out of the ICU. They do not have any needs or new concerns at this time. They asked this RN about the patient's medicaid status, this RN informed them it was social work who was helping with this. No other questions at this time. Bedside nursing without needs at this time. Will continue to follow for any changes or advances.  ? ? ?Thank you for allowing the Palliative Medicine Team to assist in the care of this patient. ?  ?  ?Damian Leavell, MSN, RN ?Palliative Medicine Team ?Team Phone: [phone]  ?This phone is monitored 7a-7p, please reach out to attending physician outside of these hours for urgent needs.   ?
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
## 2022-03-31 LAB — CBC
HCT: 36.7 % — ABNORMAL LOW (ref 39.0–52.0)
Hemoglobin: 12.1 g/dL — ABNORMAL LOW (ref 13.0–17.0)
MCH: 31 pg (ref 26.0–34.0)
MCHC: 33 g/dL (ref 30.0–36.0)
MCV: 94.1 fL (ref 80.0–100.0)
Platelets: 367 10*3/uL (ref 150–400)
RBC: 3.9 MIL/uL — ABNORMAL LOW (ref 4.22–5.81)
RDW: 12.1 % (ref 11.5–15.5)
WBC: 12.2 10*3/uL — ABNORMAL HIGH (ref 4.0–10.5)
nRBC: 0 % (ref 0.0–0.2)

## 2022-03-31 LAB — BASIC METABOLIC PANEL
Anion gap: 8 (ref 5–15)
BUN: 22 mg/dL — ABNORMAL HIGH (ref 6–20)
CO2: 28 mmol/L (ref 22–32)
Calcium: 9.6 mg/dL (ref 8.9–10.3)
Chloride: 104 mmol/L (ref 98–111)
Creatinine, Ser: 0.75 mg/dL (ref 0.61–1.24)
GFR, Estimated: >60 mL/min (ref >60)
Glucose, Bld: 129 mg/dL — ABNORMAL HIGH (ref 70–99)
Potassium: 3.6 mmol/L (ref 3.5–5.1)
Sodium: 140 mmol/L (ref 135–145)

## 2022-03-31 LAB — GLUCOSE, CAPILLARY
Glucose-Capillary: 113 mg/dL — ABNORMAL HIGH (ref 70–99)
Glucose-Capillary: 131 mg/dL — ABNORMAL HIGH (ref 70–99)
Glucose-Capillary: 123 mg/dL — ABNORMAL HIGH (ref 70–99)
Glucose-Capillary: 124 mg/dL — ABNORMAL HIGH (ref 70–99)
Glucose-Capillary: 121 mg/dL — ABNORMAL HIGH (ref 70–99)
Glucose-Capillary: 107 mg/dL — ABNORMAL HIGH (ref 70–99)

## 2022-03-31 MED ORDER — POLYETHYLENE GLYCOL 3350 17 G PO PACK
17.0000 g | PACK | Freq: Every day | ORAL | Status: DC | PRN
  Administered 2022-04-12 – 2022-04-19 (×2): 17 g
  Filled 2022-03-31 (×2): qty 1

## 2022-03-31 MED ORDER — QUETIAPINE FUMARATE 50 MG PO TABS
25.0000 mg | ORAL_TABLET | Freq: Two times a day (BID) | ORAL | Status: DC
  Administered 2022-03-31 – 2022-04-03 (×6): 25 mg
  Filled 2022-03-31 (×6): qty 1
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
## 2022-03-31 NOTE — TOC Progression Note (Signed)
Transition of Care (TOC) - Progression Note  ? ? ?Patient Details  ?Name: Carl Carpenter ?MRN: [id number] ?Date of Birth: May 07, 1986 ? ?Transition of Care (TOC) CM/SW Contact  ?Glennon Mac, RN ?Phone Number: ? ? ?Clinical Narrative:    ?Received message that patient's fiance/family are requesting incapacity letter for patient to assist with handling his affairs.  [employer] required incapacity letter with application for Medicaid, and is available.   Spoke with patient's fiance, [name]; she states that she and patient's father will be visiting after [place], and she can pick it up then.  Left letter with patient's nurse to give to family.   ? ? ?Expected Discharge Plan: IP Rehab Facility ?Barriers to Discharge: Continued Medical Work up ? ?Expected Discharge Plan and Services ?Expected Discharge Plan: IP Rehab Facility ?  ?Discharge Planning Services: CM Consult ?  ?Living arrangements for the past 2 months: Single Family Home ?                ?  ?  ?  ?  ?  ?  ?  ?  ?  ?  ? ? ?Social Determinants of Health (SDOH) Interventions ?  ? ?Readmission Risk Interventions ?   ? View : No data to display.  ?  ?  ?  ? ?Quintella Baton, RN, BSN  ?Trauma/Neuro ICU Case Manager ?[phone] ? ? ?

## 2022-03-31 NOTE — Progress Notes (Addendum)
? ?Progress Note ? ?13 Days Post-Op  ?Subjective: ?Pt not responsive to me. Some spontaneous movement of L hand but no purposeful response to stimuli. Coughing up some thick secretions but strong cough. Tolerating TF and having bowel function. UOP good. Father at bedside this AM.  ? ?Objective: ?Vital signs in last 24 hours: ?Temp:  [97.4 ?F (36.3 ?C)-99.6 ?F (37.6 ?C)] 98.3 ?F (36.8 ?C) (04/11 4742) ?Pulse Rate:  [90-109] 96 (04/11 0815) ?Resp:  [20-31] 26 (04/11 0815) ?BP: (112-140)/(79-91) 140/89 (04/11 0742) ?SpO2:  [93 %-97 %] 95 % (04/11 0815) ?FiO2 (%):  [35 %-40 %] 35 % (04/11 0815) ?Weight:  [110.4 kg] 110.4 kg (04/11 0349) ?Last BM Date : 03/31/22 ? ?Intake/Output from previous day: ?04/10 0701 - 04/11 0700 ?In: 160 [NG/GT:160] ?Out: 1600 [Urine:1600] ?Intake/Output this shift: ?Total I/O ?In: -  ?Out: 650 [Urine:450; Stool:200] ? ?PE: ?General: WD/WN male, NAD ?ENT: tongue slightly protruded but does appear dry or necrotic  ?Neck: collar present ?Heart: regular, rate, and rhythm. Palpable radial and pedal pulses bilaterally ?Lungs: CTAB, no wheezes, rhonchi, or rales noted. Trach present, thick secretions, strong cough ?Abd: soft, NT, ND, +BS, PEG present and without leakage around, binder present  ?MS: all 4 extremities are symmetrical with no cyanosis, clubbing, or edema. ?Skin: warm and dry with no masses, lesions, or rashes ?Neuro: comatose, does not FC ? ?Lab Results:  ?Recent Labs  ?  03/30/22 ?0238 03/31/22 ?0242  ?WBC 12.9* 12.2*  ?HGB 11.6* 12.1*  ?HCT 35.7* 36.7*  ?PLT 379 367  ? ?BMET ?Recent Labs  ?  03/31/22 ?0242  ?NA 140  ?K 3.6  ?CL 104  ?CO2 28  ?GLUCOSE 129*  ?BUN 22*  ?CREATININE 0.75  ?CALCIUM 9.6  ? ?PT/INR ?No results for input(s): LABPROT, INR in the last 72 hours. ?CMP  ?   ?Component Value Date/Time  ? NA 140 03/31/2022 0242  ? K 3.6 03/31/2022 0242  ? CL 104 03/31/2022 0242  ? CO2 28 03/31/2022 0242  ? GLUCOSE 129 (H) 03/31/2022 0242  ? BUN 22 (H) 03/31/2022 0242  ? CREATININE  0.75 03/31/2022 0242  ? CALCIUM 9.6 03/31/2022 0242  ? PROT 7.8 03/10/2022 2117  ? ALBUMIN 4.8 03/10/2022 2117  ? AST 42 (H) 03/10/2022 2117  ? ALT 43 03/10/2022 2117  ? ALKPHOS 67 03/10/2022 2117  ? BILITOT 0.6 03/10/2022 2117  ? GFRNONAA >60 03/31/2022 0242  ? ?Lipase  ?No results found for: LIPASE ? ? ? ? ?Studies/Results: ?No results found. ? ?Anti-infectives: ?Anti-infectives (From admission, onward)  ? ? Start     Dose/Rate Route Frequency Ordered Stop  ? 03/14/22 1015  ceFEPIme (MAXIPIME) 2 g in sodium chloride 0.9 % 100 mL IVPB  Status:  Discontinued       ? 2 g ?200 mL/hr over 30 Minutes Intravenous Every 12 hours 03/14/22 0926 03/14/22 0934  ? 03/14/22 1000  ceFEPIme (MAXIPIME) 2 g in sodium chloride 0.9 % 100 mL IVPB  Status:  Discontinued       ? 2 g ?200 mL/hr over 30 Minutes Intravenous Every 8 hours 03/14/22 0935 03/16/22 1017  ? 03/11/22 0915  clindamycin (CLEOCIN) 75 MG/5ML solution 150 mg       ? 150 mg Per Tube Every 8 hours 03/11/22 0823 03/15/22 2131  ? 03/10/22 2215  ceFAZolin (ANCEF) IVPB 2g/100 mL premix       ? 2 g ?200 mL/hr over 30 Minutes Intravenous  Once 03/10/22 2202 03/10/22 2307  ? ?  ? ? ? ?  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 Jordan Likes, keppra x7d for sz ppx, TBI therapies once extubated. Repeat CT H 3/23 stable. MRI brain 3/27 with microhemorrhages in cortex, corpaus callosum, basal ganglia, midbrain, pons. Dr. Riley Kill with PM&R added amantadine 3/31, increased to 200mg  BID 4/3. Exam is improving some.  ?C1 TP fx, occipital condyle fx - per Dr. 6/3, collar ?Acute ventilator dependent respiratory failure - cont PSV trials, on guaifenisen for thick secretions, mucomyst, robinul and scop patch. Change to #6 cuffless trach and continue to monitor. Resp Cx have not grown any organisms ?R ear laceration - S/P repair, completed Clinda x 5d per Dr. Jordan Likes ?Dislodged PEG - G tube by IR. Meds per tube and TF ?Acute urinary retention - foley out,  voiding ?Hyperglycemia - likely from TF, SSI ? ?FEN - NPO, TF 70 ml/h (goal 80), SLP ?R post tib and peroneal DVT - tx-ic LMWH, okay to change to DOAC ?ID - fever/leukocytosis significantly improved, 12.2 ? ?Dispo - 4NP, PT/OT, anticipate SNF placement  ? LOS: 21 days  ? ?I reviewed CBC and BMET. Reviewed vitals and medications for last 24 hrs.  ? ? ?Elijah Birk, PA-C ?[facility] ?03/31/2022, 9:57 AM ?Please see Amion for pager number during day hours 7:00am-4:30pm ? ?
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
## 2022-03-31 NOTE — Progress Notes (Signed)
Stage 2 pressure ulcer noted to posterior head 3x3 pink in color nonblanchable. Foam dressing placed and off loaded.  ?

## 2022-03-31 NOTE — Progress Notes (Signed)
Pt's trach tube was changed to a #6 cuffless Shiley flex trach tube at bedside without difficulty.  Trach tube placement verified with EZ Cap CO2 detector and by the presence of bilateral breath sounds.   ?
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
## 2022-04-01 DIAGNOSIS — L8990 Pressure ulcer of unspecified site, unspecified stage: Secondary | ICD-10-CM | POA: Insufficient documentation

## 2022-04-01 LAB — CBC
HCT: 39.5 % (ref 39.0–52.0)
Hemoglobin: 12.6 g/dL — ABNORMAL LOW (ref 13.0–17.0)
MCH: 30.1 pg (ref 26.0–34.0)
MCHC: 31.9 g/dL (ref 30.0–36.0)
MCV: 94.3 fL (ref 80.0–100.0)
Platelets: 394 10*3/uL (ref 150–400)
RBC: 4.19 MIL/uL — ABNORMAL LOW (ref 4.22–5.81)
RDW: 12.2 % (ref 11.5–15.5)
WBC: 12.9 10*3/uL — ABNORMAL HIGH (ref 4.0–10.5)
nRBC: 0 % (ref 0.0–0.2)

## 2022-04-01 LAB — GLUCOSE, CAPILLARY
Glucose-Capillary: 120 mg/dL — ABNORMAL HIGH (ref 70–99)
Glucose-Capillary: 129 mg/dL — ABNORMAL HIGH (ref 70–99)
Glucose-Capillary: 146 mg/dL — ABNORMAL HIGH (ref 70–99)
Glucose-Capillary: 110 mg/dL — ABNORMAL HIGH (ref 70–99)
Glucose-Capillary: 117 mg/dL — ABNORMAL HIGH (ref 70–99)
Glucose-Capillary: 133 mg/dL — ABNORMAL HIGH (ref 70–99)

## 2022-04-01 LAB — BASIC METABOLIC PANEL
Anion gap: 10 (ref 5–15)
BUN: 22 mg/dL — ABNORMAL HIGH (ref 6–20)
CO2: 27 mmol/L (ref 22–32)
Calcium: 9.8 mg/dL (ref 8.9–10.3)
Chloride: 103 mmol/L (ref 98–111)
Creatinine, Ser: 0.76 mg/dL (ref 0.61–1.24)
GFR, Estimated: >60 mL/min (ref >60)
Glucose, Bld: 110 mg/dL — ABNORMAL HIGH (ref 70–99)
Potassium: 3.9 mmol/L (ref 3.5–5.1)
Sodium: 140 mmol/L (ref 135–145)

## 2022-04-01 MED ORDER — SILVER NITRATE-POT NITRATE 75-25 % EX MISC
1.0000 "application " | Freq: Once | CUTANEOUS | Status: AC
  Administered 2022-04-01: 1 via TOPICAL
  Filled 2022-04-01: qty 1

## 2022-04-01 MED ORDER — ENOXAPARIN SODIUM 120 MG/0.8ML IJ SOSY
120.0000 mg | PREFILLED_SYRINGE | Freq: Two times a day (BID) | INTRAMUSCULAR | Status: DC
  Administered 2022-04-02 (×2): 120 mg via SUBCUTANEOUS
  Filled 2022-04-01 (×3): qty 0.8

## 2022-04-01 MED ORDER — METHOCARBAMOL 750 MG PO TABS
750.0000 mg | ORAL_TABLET | Freq: Three times a day (TID) | ORAL | Status: DC
  Administered 2022-04-01 – 2022-04-02 (×3): 750 mg
  Filled 2022-04-01 (×3): qty 1

## 2022-04-01 NOTE — Progress Notes (Addendum)
Pt tongue noted to continue bleeding this RN cleaning and suctioning blood from mouth every 1-2 hours. Pt also continued to cough up copious amt of bloody secretions from trach. VS remain stable. MD notified and will come to bedside.  ? ?1630- PA-C at bedside placed rolled gauze in between teeth on right side.  ?

## 2022-04-01 NOTE — Progress Notes (Signed)
300 ml of bloody secretions from mouth and trach noted in canister. New canister placed.  ?

## 2022-04-01 NOTE — Progress Notes (Signed)
? ?Progress Note ? ?14 Days Post-Op  ?Subjective: ?Pt more active this AM and did actually stick his tongue out and open mouth on command for me. He appears comfortable. He has a small pressure wound that was noted to posterior scalp - foam dressing present this AM. Tolerating TF and voiding appropriately. Still having thick secretions but tolerated trach change well yesterday.  ? ?Objective: ?Vital signs in last 24 hours: ?Temp:  [97.8 ?F (36.6 ?C)-99.5 ?F (37.5 ?C)] 97.8 ?F (36.6 ?C) (04/12 0800) ?Pulse Rate:  [92-112] 100 (04/12 0802) ?Resp:  [20-32] 28 (04/12 0802) ?BP: (124-146)/(78-92) 132/78 (04/12 0800) ?SpO2:  [94 %-98 %] 94 % (04/12 0802) ?FiO2 (%):  [35 %] 35 % (04/12 0800) ?Last BM Date : 03/31/22 ? ?Intake/Output from previous day: ?04/11 0701 - 04/12 0700 ?In: -  ?Out: 1400 [Urine:1200; Stool:200] ?Intake/Output this shift: ?Total I/O ?In: -  ?Out: 100 [Stool:100] ? ?PE: ?General: WD/WN male, NAD ?ENT: tongue slightly protruded but does appear dry or necrotic  ?Neck: collar present ?Heart: regular, rate, and rhythm. Palpable radial and pedal pulses bilaterally ?Lungs: CTAB, no wheezes, rhonchi, or rales noted. Trach present, thick secretions, strong cough ?Abd: soft, NT, ND, +BS, PEG present and without leakage around, binder present  ?MS: all 4 extremities are symmetrical with no cyanosis, clubbing, or edema. ?Skin: warm and dry with no masses, lesions, or rashes ?Neuro: more active in BUE and feet today, followed some commands, did not voluntarily open eyes or make attempts at phonation  ? ?Lab Results:  ?Recent Labs  ?  03/31/22 ?0242 04/01/22 ?[id number]  ?WBC 12.2* 12.9*  ?HGB 12.1* 12.6*  ?HCT 36.7* 39.5  ?PLT 367 394  ? ? ?BMET ?Recent Labs  ?  03/31/22 ?0242 04/01/22 ?0227  ?NA 140 140  ?K 3.6 3.9  ?CL 104 103  ?CO2 28 27  ?GLUCOSE 129* 110*  ?BUN 22* 22*  ?CREATININE 0.75 0.76  ?CALCIUM 9.6 9.8  ? ? ?PT/INR ?No results for input(s): LABPROT, INR in the last 72 hours. ?CMP  ?   ?Component Value  Date/Time  ? NA 140 04/01/2022 0227  ? K 3.9 04/01/2022 0227  ? CL 103 04/01/2022 0227  ? CO2 27 04/01/2022 0227  ? GLUCOSE 110 (H) 04/01/2022 0227  ? BUN 22 (H) 04/01/2022 0227  ? CREATININE 0.76 04/01/2022 0227  ? CALCIUM 9.8 04/01/2022 0227  ? PROT 7.8 03/10/2022 2117  ? ALBUMIN 4.8 03/10/2022 2117  ? AST 42 (H) 03/10/2022 2117  ? ALT 43 03/10/2022 2117  ? ALKPHOS 67 03/10/2022 2117  ? BILITOT 0.6 03/10/2022 2117  ? GFRNONAA >60 04/01/2022 0227  ? ?Lipase  ?No results found for: LIPASE ? ? ? ? ?Studies/Results: ?No results found. ? ?Anti-infectives: ?Anti-infectives (From admission, onward)  ? ? Start     Dose/Rate Route Frequency Ordered Stop  ? 03/14/22 1015  ceFEPIme (MAXIPIME) 2 g in sodium chloride 0.9 % 100 mL IVPB  Status:  Discontinued       ? 2 g ?200 mL/hr over 30 Minutes Intravenous Every 12 hours 03/14/22 0926 03/14/22 0934  ? 03/14/22 1000  ceFEPIme (MAXIPIME) 2 g in sodium chloride 0.9 % 100 mL IVPB  Status:  Discontinued       ? 2 g ?200 mL/hr over 30 Minutes Intravenous Every 8 hours 03/14/22 0935 03/16/22 1017  ? 03/11/22 0915  clindamycin (CLEOCIN) 75 MG/5ML solution 150 mg       ? 150 mg Per Tube Every 8 hours 03/11/22  [id number] 03/15/22 2131  ? 03/10/22 2215  ceFAZolin (ANCEF) IVPB 2g/100 mL premix       ? 2 g ?200 mL/hr over 30 Minutes Intravenous  Once 03/10/22 2202 03/10/22 2307  ? ?  ? ? ? 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 Annette Stable, keppra x7d for sz ppx, TBI therapies once extubated. Repeat CT H 3/23 stable. MRI brain 3/27 with microhemorrhages in cortex, corpaus callosum, basal ganglia, midbrain, pons. Dr. Naaman Plummer with PM&R added amantadine 3/31, increased to 200mg  BID 4/3. Exam is improving some. Weaned seroquel 4/11, decrease robaxin some today.  ?C1 TP fx, occipital condyle fx - per Dr. Annette Stable, collar ?Acute ventilator dependent respiratory failure - cont PSV trials, on guaifenisen for thick secretions, mucomyst, robinul and scop patch. Changed to #6 cuffless  trach 4/11. Resp Cx have not grown any organisms ?R ear laceration - S/P repair, completed Clinda x 5d per Dr. Marcelline Deist ?Dislodged PEG - G tube by IR. Meds per tube and TF ?Acute urinary retention - foley out, voiding ?Hyperglycemia - likely from TF, SSI ? ?FEN - NPO, TF 70 ml/h (goal 80), SLP ?R post tib and peroneal DVT - tx-ic LMWH, okay to change to DOAC ?ID - fever/leukocytosis significantly improved, 12.2 ? ?Dispo - 4NP, PT/OT/SLP. SNF vs CIR pending progress ? LOS: 22 days  ? ?I reviewed vitals and meds for last 24 hrs. Reviewed nursing and therapy notes.  ? ? ?Norm Parcel, PA-C ?[facility] ?04/01/2022, 10:54 AM ?Please see Amion for pager number during day hours 7:00am-4:30pm ? ?

## 2022-04-01 NOTE — Progress Notes (Addendum)
1715- Pt noted to have increased pooling of blood in his mouth. Small rolled gauze replaced in between teeth on right side to prevent pt from continuing to bite down on tongue.  ? ?1750- Gauze noted to be saturated with blood. Dr. Fredricka Bonine paged and came to beside. Rolled gauze placed on both sides of mouth in between teeth to prevent biting of tongue. Verbal order to continue with roll gauze if pt pushing it out or becomes saturated.  ?
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
## 2022-04-02 ENCOUNTER — Inpatient Hospital Stay (HOSPITAL_COMMUNITY): Payer: Medicaid Other

## 2022-04-02 LAB — CBC
HCT: 41.1 % (ref 39.0–52.0)
Hemoglobin: 13.4 g/dL (ref 13.0–17.0)
MCH: 30.5 pg (ref 26.0–34.0)
MCHC: 32.6 g/dL (ref 30.0–36.0)
MCV: 93.4 fL (ref 80.0–100.0)
Platelets: 356 10*3/uL (ref 150–400)
RBC: 4.4 MIL/uL (ref 4.22–5.81)
RDW: 12.5 % (ref 11.5–15.5)
WBC: 11.5 10*3/uL — ABNORMAL HIGH (ref 4.0–10.5)
nRBC: 0 % (ref 0.0–0.2)

## 2022-04-02 LAB — BASIC METABOLIC PANEL
Anion gap: 8 (ref 5–15)
BUN: 25 mg/dL — ABNORMAL HIGH (ref 6–20)
CO2: 27 mmol/L (ref 22–32)
Calcium: 9.8 mg/dL (ref 8.9–10.3)
Chloride: 105 mmol/L (ref 98–111)
Creatinine, Ser: 0.64 mg/dL (ref 0.61–1.24)
GFR, Estimated: >60 mL/min (ref >60)
Glucose, Bld: 124 mg/dL — ABNORMAL HIGH (ref 70–99)
Potassium: 3.8 mmol/L (ref 3.5–5.1)
Sodium: 140 mmol/L (ref 135–145)

## 2022-04-02 LAB — GLUCOSE, CAPILLARY
Glucose-Capillary: 136 mg/dL — ABNORMAL HIGH (ref 70–99)
Glucose-Capillary: 115 mg/dL — ABNORMAL HIGH (ref 70–99)
Glucose-Capillary: 136 mg/dL — ABNORMAL HIGH (ref 70–99)
Glucose-Capillary: 133 mg/dL — ABNORMAL HIGH (ref 70–99)
Glucose-Capillary: 120 mg/dL — ABNORMAL HIGH (ref 70–99)
Glucose-Capillary: 125 mg/dL — ABNORMAL HIGH (ref 70–99)

## 2022-04-02 MED ORDER — METHOCARBAMOL 500 MG PO TABS
500.0000 mg | ORAL_TABLET | Freq: Three times a day (TID) | ORAL | Status: DC
  Administered 2022-04-02 – 2022-04-03 (×3): 500 mg
  Filled 2022-04-02 (×3): qty 1

## 2022-04-02 NOTE — Progress Notes (Signed)
Rolled gauze placed on both side of the mouth earlier in the shift , changed x1 d/t moderate amount of blood. No bleeding noted from mouth at this time. ?

## 2022-04-02 NOTE — Progress Notes (Signed)
SLP Cancellation Note ? ?Patient Details ?Name: Carl Carpenter ?MRN: [id number] ?DOB: June 15, 1986 ? ? ?Cancelled treatment:       Reason Eval/Treat Not Completed: Patient at procedure or test/unavailable (Pt having x-ray completed at this time. SLP will follow up later as schedule allows.) ? ?Giulio Bertino I. Hardin Negus, [place], CCC-SLP ?Acute Rehabilitation Services ?Office number [phone] ?Pager [phone] ? ?Horton Marshall ?04/02/2022, 10:23 AM ?
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
## 2022-04-02 NOTE — Progress Notes (Addendum)
? ?Progress Note ? ?15 Days Post-Op  ?Subjective: ?Pt with bleeding from tongue yesterday secondary to biting. PM lovenox held. No further bleeding after roll of gauze replaced to keep patient from biting overnight. No bite block present this AM but no further bleeding this AM. He is coughing well. He is tolerating tube feeds and is voiding appropriately. He is becoming a little more active.  ? ?Objective: ?Vital signs in last 24 hours: ?Temp:  [97.6 ?F (36.4 ?C)-98.5 ?F (36.9 ?C)] 98 ?F (36.7 ?C) (04/13 2841) ?Pulse Rate:  [96-107] 107 (04/13 0829) ?Resp:  [12-27] 24 (04/13 0829) ?BP: (122-142)/(83-99) 125/97 (04/13 3244) ?SpO2:  [92 %-95 %] 92 % (04/13 0829) ?FiO2 (%):  [35 %] 35 % (04/13 0810) ?Last BM Date : 04/02/22 ? ?Intake/Output from previous day: ?04/12 0701 - 04/13 0700 ?In: 80 [NG/GT:80] ?Out: 2200 [Urine:2100; Stool:100] ?Intake/Output this shift: ?Total I/O ?In: 320 [NG/GT:320] ?Out: 600 [Urine:600] ? ?PE: ?General: WD/WN male, NAD ?ENT: tongue with some scab present this AM but no active bleeding  ?Neck: collar present ?Heart: regular, rate, and rhythm. Palpable radial and pedal pulses bilaterally ?Lungs: CTAB, no wheezes, rhonchi, or rales noted. Trach present, thick secretions, strong cough ?Abd: soft, NT, ND, +BS, PEG present and without leakage around, binder present  ?MS: all 4 extremities are symmetrical with no cyanosis, clubbing, or edema. ?Skin: warm and dry with no masses, lesions, or rashes ?Neuro: more active in BUE and feet today, followed some commands, did not voluntarily open eyes or make attempts at phonation  ? ?Lab Results:  ?Recent Labs  ?  04/01/22 ?0227 04/02/22 ?0300  ?WBC 12.9* 11.5*  ?HGB 12.6* 13.4  ?HCT 39.5 41.1  ?PLT 394 356  ? ? ?BMET ?Recent Labs  ?  04/01/22 ?0227 04/02/22 ?0300  ?NA 140 140  ?K 3.9 3.8  ?CL 103 105  ?CO2 27 27  ?GLUCOSE 110* 124*  ?BUN 22* 25*  ?CREATININE 0.76 0.64  ?CALCIUM 9.8 9.8  ? ? ?PT/INR ?No results for input(s): LABPROT, INR in the last 72  hours. ?CMP  ?   ?Component Value Date/Time  ? NA 140 04/02/2022 0300  ? K 3.8 04/02/2022 0300  ? CL 105 04/02/2022 0300  ? CO2 27 04/02/2022 0300  ? GLUCOSE 124 (H) 04/02/2022 0300  ? BUN 25 (H) 04/02/2022 0300  ? CREATININE 0.64 04/02/2022 0300  ? CALCIUM 9.8 04/02/2022 0300  ? PROT 7.8 03/10/2022 2117  ? ALBUMIN 4.8 03/10/2022 2117  ? AST 42 (H) 03/10/2022 2117  ? ALT 43 03/10/2022 2117  ? ALKPHOS 67 03/10/2022 2117  ? BILITOT 0.6 03/10/2022 2117  ? GFRNONAA >60 04/02/2022 0300  ? ?Lipase  ?No results found for: LIPASE ? ? ? ? ?Studies/Results: ?DG CHEST PORT 1 VIEW ? ?Result Date: 04/02/2022 ?CLINICAL DATA:  Difficulty breathing, aspiration EXAM: PORTABLE CHEST 1 VIEW COMPARISON:  Previous studies including the examination of 03/24/2022 FINDINGS: Transverse diameter of heart is slightly increased. Central pulmonary vessels are prominent. Increased interstitial markings are seen in the parahilar regions and lower lung fields with slight interval improvement. There is no pleural effusion or pneumothorax. Tip of tracheostomy is 6.2 cm above the carina. IMPRESSION: There is improvement in aeration of parahilar regions and lower lung fields suggesting resolving interstitial pneumonia or resolving pulmonary edema. There are no new focal infiltrates. Electronically Signed   By: Ernie Avena M.D.   On: 04/02/2022 10:31   ? ?Anti-infectives: ?Anti-infectives (From admission, onward)  ? ? Start  Dose/Rate Route Frequency Ordered Stop  ? 03/14/22 1015  ceFEPIme (MAXIPIME) 2 g in sodium chloride 0.9 % 100 mL IVPB  Status:  Discontinued       ? 2 g ?200 mL/hr over 30 Minutes Intravenous Every 12 hours 03/14/22 0926 03/14/22 0934  ? 03/14/22 1000  ceFEPIme (MAXIPIME) 2 g in sodium chloride 0.9 % 100 mL IVPB  Status:  Discontinued       ? 2 g ?200 mL/hr over 30 Minutes Intravenous Every 8 hours 03/14/22 0935 03/16/22 1017  ? 03/11/22 0915  clindamycin (CLEOCIN) 75 MG/5ML solution 150 mg       ? 150 mg Per Tube Every 8  hours 03/11/22 0823 03/15/22 2131  ? 03/10/22 2215  ceFAZolin (ANCEF) IVPB 2g/100 mL premix       ? 2 g ?200 mL/hr over 30 Minutes Intravenous  Once 03/10/22 2202 03/10/22 2307  ? ?  ? ? ? 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 Jordan Likes, keppra x7d for sz ppx, TBI therapies once extubated. Repeat CT H 3/23 stable. MRI brain 3/27 with microhemorrhages in cortex, corpaus callosum, basal ganglia, midbrain, pons. Dr. Riley Kill with PM&R added amantadine 3/31, increased to 200mg  BID 4/3. Exam is improving some. Weaned seroquel 4/11, decrease robaxin again today.  ?C1 TP fx, occipital condyle fx - per Dr. 6/11, collar ?Acute ventilator dependent respiratory failure - cont PSV trials, on guaifenisen for thick secretions, mucomyst, robinul and scop patch. Changed to #6 cuffless trach 4/11. Resp Cx have not grown any organisms Repeat CXR this AM with improvement ?R ear laceration - S/P repair, completed Clinda x 5d per Dr. 6/11 ?Dislodged PEG - G tube by IR. Meds per tube and TF ?Acute urinary retention - foley out, voiding ?Hyperglycemia - likely from TF, SSI ? ?FEN - NPO, TF 70 ml/h (goal 80), SLP ?R post tib and peroneal DVT - tx-ic LMWH, okay to change to DOAC ?ID - fever/leukocytosis significantly improved, 11.5 ? ?Dispo - 4NP, PT/OT/SLP. SNF vs CIR pending progress ? LOS: 23 days  ? ?I reviewed vitals and meds for last 24 hrs. Reviewed nursing and therapy notes.  ? ? ?Elijah Birk, PA-C ?[facility] ?04/02/2022, 11:16 AM ?Please see Amion for pager number during day hours 7:00am-4:30pm ? ?
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
## 2022-04-02 NOTE — Progress Notes (Signed)
Patient QA:STMHDQ Carl Carpenter      DOB: 04/17/1986      QIW:[id number] ? ? ? ?  ?Palliative Medicine Team ? ? ? ?Subjective: Bedside symptom check completed. Fiance bedside at time of visit.  ? ? ?Physical exam: Patient resting in bed with eyes closed at time of visit. Patient with even and non-labored breathing with trach collar applied to tracheostomy site. Patient with strong, productive cough. No physical or non-verbal signs of pain or discomfort noted at this time. Patient not reactive to verbal stimuli or opening eyes to this RN. Fiance endorses that his responsiveness is intermittent. Patient with good coloring today, less facial swelling noted as compared to last visit. Noted significant amount of blood collected in suction canister bedside from oral bleeding. No active bleeding seen by this RN.  ? ? ?Assessment and plan: Will continue to follow along with hospital course and be available for support with any changes or advances.  ? ? ?Thank you for allowing the Palliative Medicine Team to assist in the care of this patient. ?  ?  ?Shelda Jakes, MSN, RN ?Palliative Medicine Team ?Team Phone: [phone]  ?This phone is monitored 7a-7p, please reach out to attending physician outside of these hours for urgent needs.   ?
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
## 2022-04-03 LAB — CBC
HCT: 41 % (ref 39.0–52.0)
Hemoglobin: 13.5 g/dL (ref 13.0–17.0)
MCH: 31.3 pg (ref 26.0–34.0)
MCHC: 32.9 g/dL (ref 30.0–36.0)
MCV: 94.9 fL (ref 80.0–100.0)
Platelets: 325 10*3/uL (ref 150–400)
RBC: 4.32 MIL/uL (ref 4.22–5.81)
RDW: 12.3 % (ref 11.5–15.5)
WBC: 16.3 10*3/uL — ABNORMAL HIGH (ref 4.0–10.5)
nRBC: 0 % (ref 0.0–0.2)

## 2022-04-03 LAB — BASIC METABOLIC PANEL
Anion gap: 8 (ref 5–15)
BUN: 29 mg/dL — ABNORMAL HIGH (ref 6–20)
CO2: 29 mmol/L (ref 22–32)
Calcium: 10.1 mg/dL (ref 8.9–10.3)
Chloride: 104 mmol/L (ref 98–111)
Creatinine, Ser: 0.84 mg/dL (ref 0.61–1.24)
GFR, Estimated: >60 mL/min (ref >60)
Glucose, Bld: 119 mg/dL — ABNORMAL HIGH (ref 70–99)
Potassium: 3.9 mmol/L (ref 3.5–5.1)
Sodium: 141 mmol/L (ref 135–145)

## 2022-04-03 LAB — GLUCOSE, CAPILLARY
Glucose-Capillary: 120 mg/dL — ABNORMAL HIGH (ref 70–99)
Glucose-Capillary: 128 mg/dL — ABNORMAL HIGH (ref 70–99)
Glucose-Capillary: 132 mg/dL — ABNORMAL HIGH (ref 70–99)
Glucose-Capillary: 146 mg/dL — ABNORMAL HIGH (ref 70–99)
Glucose-Capillary: 127 mg/dL — ABNORMAL HIGH (ref 70–99)
Glucose-Capillary: 137 mg/dL — ABNORMAL HIGH (ref 70–99)

## 2022-04-03 MED ORDER — QUETIAPINE FUMARATE 50 MG PO TABS
25.0000 mg | ORAL_TABLET | Freq: Every day | ORAL | Status: DC
  Administered 2022-04-04 – 2022-04-08 (×5): 25 mg
  Filled 2022-04-03 (×5): qty 1

## 2022-04-03 MED ORDER — ENOXAPARIN SODIUM 120 MG/0.8ML IJ SOSY
120.0000 mg | PREFILLED_SYRINGE | Freq: Two times a day (BID) | INTRAMUSCULAR | Status: DC
  Administered 2022-04-04 – 2022-04-05 (×3): 120 mg via SUBCUTANEOUS
  Filled 2022-04-03 (×4): qty 0.8
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
## 2022-04-03 NOTE — Progress Notes (Signed)
? ?Trauma/Critical Care Follow Up Note ? ?Subjective:  ?  ?Overnight Issues:  ? ?Objective:  ?Vital signs for last 24 hours: ?Temp:  [97 ?F (36.1 ?C)-98.8 ?F (37.1 ?C)] 98.5 ?F (36.9 ?C) (04/14 0730) ?Pulse Rate:  [101-123] 103 (04/14 0730) ?Resp:  [13-28] 26 (04/14 0730) ?BP: (127-141)/(85-92) 136/86 (04/14 0730) ?SpO2:  [91 %-96 %] 94 % (04/14 0730) ?FiO2 (%):  [35 %] 35 % (04/14 0730) ? ?Hemodynamic parameters for last 24 hours: ?  ? ?Intake/Output from previous day: ?04/13 0701 - 04/14 0700 ?In: 1190 [NG/GT:720] ?Out: 1250 [Urine:1250]  ?Intake/Output this shift: ?No intake/output data recorded. ? ?Vent settings for last 24 hours: ?FiO2 (%):  [35 %] 35 % ? ?Physical Exam:  ?Gen: comfortable, no distress ?Neuro: non-focal exam ?HEENT: PERRL ?Neck: supple ?CV: RRR ?Pulm: unlabored breathing ?Abd: soft, NT ?GU: clear yellow urine ?Extr: wwp, no edema ? ? ?Results for orders placed or performed during the hospital encounter of 03/10/22 (from the past 24 hour(s))  ?Glucose, capillary     Status: Abnormal  ? Collection Time: 04/02/22 11:35 AM  ?Result Value Ref Range  ? Glucose-Capillary 136 (H) 70 - 99 mg/dL  ?Glucose, capillary     Status: Abnormal  ? Collection Time: 04/02/22  3:57 PM  ?Result Value Ref Range  ? Glucose-Capillary 133 (H) 70 - 99 mg/dL  ?Glucose, capillary     Status: Abnormal  ? Collection Time: 04/02/22  7:26 PM  ?Result Value Ref Range  ? Glucose-Capillary 120 (H) 70 - 99 mg/dL  ?Glucose, capillary     Status: Abnormal  ? Collection Time: 04/02/22 11:04 PM  ?Result Value Ref Range  ? Glucose-Capillary 125 (H) 70 - 99 mg/dL  ?Glucose, capillary     Status: Abnormal  ? Collection Time: 04/03/22  3:25 AM  ?Result Value Ref Range  ? Glucose-Capillary 120 (H) 70 - 99 mg/dL  ?Basic metabolic panel     Status: Abnormal  ? Collection Time: 04/03/22  4:45 AM  ?Result Value Ref Range  ? Sodium 141 135 - 145 mmol/L  ? Potassium 3.9 3.5 - 5.1 mmol/L  ? Chloride 104 98 - 111 mmol/L  ? CO2 29 22 - 32 mmol/L   ? Glucose, Bld 119 (H) 70 - 99 mg/dL  ? BUN 29 (H) 6 - 20 mg/dL  ? Creatinine, Ser 0.84 0.61 - 1.24 mg/dL  ? Calcium 10.1 8.9 - 10.3 mg/dL  ? GFR, Estimated >60 >60 mL/min  ? Anion gap 8 5 - 15  ?Glucose, capillary     Status: Abnormal  ? Collection Time: 04/03/22  7:58 AM  ?Result Value Ref Range  ? Glucose-Capillary 128 (H) 70 - 99 mg/dL  ? ? ?Assessment & Plan: ? ?Present on Admission: ? TBI (traumatic brain injury) (HCC) ? ? ? LOS: 24 days  ? ?Additional comments:I reviewed the patient's new clinical lab test results.   and I reviewed the patients new imaging test results.   ? ?[facility] ? 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 Jordan Likes, keppra x7d for sz ppx, TBI therapies once extubated. Repeat CT H 3/23 stable. MRI brain 3/27 with microhemorrhages in cortex, corpaus callosum, basal ganglia, midbrain, pons. Dr. Riley Kill with PM&R added amantadine 3/31, increased to 200mg  BID 4/3. Exam is improving some. Weaned seroquel 4/11, decrease robaxin again today.  ?C1 TP fx, occipital condyle fx - per Dr. 6/11, collar ?Acute ventilator dependent respiratory failure - cont PSV trials, on guaifenisen for thick secretions, mucomyst, robinul and scop  patch. Changed to #6 cuffless trach 4/11. ?R ear laceration - S/P repair, completed Clinda x 5d per Dr. Elijah Birk ?Dislodged PEG - G tube by IR. Meds per tube and TF ?Acute urinary retention - foley out, voiding ?Hyperglycemia - likely from TF, SSI ?Tongue biting - multiple efforts to obtain a type of bite block over the last couple of days and continued efforts to find some type of mouth guard to prevent tongue biting. Currently with rolled gauze in. Spoke with OMFS today and only other recs at this point are for MMF with or without an integrated bite block. 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 Patient's parents are requesting holding of today's doses of therapeutic LMWH. I have discussed the risks of holding therapeutic AC with them, especially as I do  not believe the amount of bleeding is significant enough at this point to discontinue. Today's LMWH doses have been held. CBC pending today. ?  ?FEN - NPO, TF 70 ml/h (goal 80), SLP ?R post tib and peroneal DVT - tx-ic LMWH, okay to change to DOAC when bleeding issues resolved ?ID - fever/leukocytosis significantly improved ?  ?Dispo - 4NP, PT/OT/SLP. SNF vs CIR pending progress ? ? ?Diamantina Monks, MD ?Trauma & General Surgery ?Please use [email or web address] to contact on call provider ? ?04/03/2022 ? ?*Care during the described time interval was provided by me. I have reviewed this patient's available data, including medical history, events of note, physical examination and test results as part of my evaluation. ? ? ? ?

## 2022-04-03 NOTE — Progress Notes (Signed)
0515-Pt tongue bleeding ,large amount of blood suctioned from mouth,mouth packed with gauze,TRN notified MD . Father remain at bedside. ?

## 2022-04-03 NOTE — Progress Notes (Signed)
Trauma Event Note ? ? ?TRN to bedside to assist primary nurse, patient biting his tongue again this morning. Lovenox restarted yesterday, received two doses. Bleeding resolved with packing, orange bite block placed in hopes of presenting further biting however patient moving it around with teeth. Bloody sputum to trach. Trauma MD made aware. Father at bedside. Pt following some commands.  ? ?Last imported Vital Signs ?BP (!) 141/92 (BP Location: Left Arm)   Pulse (!) 114   Temp (!) 97 ?F (36.1 ?C) (Axillary)   Resp (!) 23   Ht 6\' 5"  (1.956 m)   Wt 243 lb 6.2 oz (110.4 kg)   SpO2 94%   BMI 28.86 kg/m?  ? ?Trending CBC ?Recent Labs  ?  04/01/22 ?0227 04/02/22 ?0300  ?WBC 12.9* 11.5*  ?HGB 12.6* 13.4  ?HCT 39.5 41.1  ?PLT 394 356  ? ? ?Trending Coag's ?No results for input(s): APTT, INR in the last 72 hours. ? ?Trending BMET ?Recent Labs  ?  04/01/22 ?0227 04/02/22 ?0300 04/03/22 ?[id number]  ?NA 140 140 141  ?K 3.9 3.8 3.9  ?CL 103 105 104  ?CO2 27 27 29   ?BUN 22* 25* 29*  ?CREATININE 0.76 0.64 0.84  ?GLUCOSE 110* 124* 119*  ? ? ? ? ?[place]  ?Trauma Response RN ? ?Please call TRN at [phone] for further assistance. ? ? ?  ?

## 2022-04-04 LAB — BASIC METABOLIC PANEL
Anion gap: 9 (ref 5–15)
BUN: 27 mg/dL — ABNORMAL HIGH (ref 6–20)
CO2: 26 mmol/L (ref 22–32)
Calcium: 9.9 mg/dL (ref 8.9–10.3)
Chloride: 107 mmol/L (ref 98–111)
Creatinine, Ser: 0.75 mg/dL (ref 0.61–1.24)
GFR, Estimated: >60 mL/min (ref >60)
Glucose, Bld: 123 mg/dL — ABNORMAL HIGH (ref 70–99)
Potassium: 3.7 mmol/L (ref 3.5–5.1)
Sodium: 142 mmol/L (ref 135–145)

## 2022-04-04 LAB — GLUCOSE, CAPILLARY
Glucose-Capillary: 130 mg/dL — ABNORMAL HIGH (ref 70–99)
Glucose-Capillary: 113 mg/dL — ABNORMAL HIGH (ref 70–99)
Glucose-Capillary: 151 mg/dL — ABNORMAL HIGH (ref 70–99)
Glucose-Capillary: 142 mg/dL — ABNORMAL HIGH (ref 70–99)
Glucose-Capillary: 127 mg/dL — ABNORMAL HIGH (ref 70–99)
Glucose-Capillary: 131 mg/dL — ABNORMAL HIGH (ref 70–99)

## 2022-04-04 LAB — CBC
HCT: 40.4 % (ref 39.0–52.0)
Hemoglobin: 13.3 g/dL (ref 13.0–17.0)
MCH: 30.7 pg (ref 26.0–34.0)
MCHC: 32.9 g/dL (ref 30.0–36.0)
MCV: 93.3 fL (ref 80.0–100.0)
Platelets: 320 10*3/uL (ref 150–400)
RBC: 4.33 MIL/uL (ref 4.22–5.81)
RDW: 12.4 % (ref 11.5–15.5)
WBC: 17.6 10*3/uL — ABNORMAL HIGH (ref 4.0–10.5)
nRBC: 0 % (ref 0.0–0.2)

## 2022-04-04 NOTE — Progress Notes (Signed)
? ?Trauma/Critical Care Follow Up Note ? ?Subjective:  ?  ?Overnight Issues: Hgb stable at 13. No active bleeding from tongue laceration. ? ?Objective:  ?Vital signs for last 24 hours: ?Temp:  [98.8 ?F (37.1 ?C)-99.6 ?F (37.6 ?C)] 98.8 ?F (37.1 ?C) (04/15 1511) ?Pulse Rate:  [113-121] 116 (04/15 1634) ?Resp:  [21-40] 30 (04/15 1634) ?BP: (117-144)/(83-97) 117/87 (04/15 1511) ?SpO2:  [91 %-96 %] 93 % (04/15 1634) ?FiO2 (%):  [35 %-40 %] 40 % (04/15 1634) ? ?Hemodynamic parameters for last 24 hours: ?  ? ?Intake/Output from previous day: ?04/14 0701 - 04/15 0700 ?In: -  ?Out: 1400 [Urine:1400]  ?Intake/Output this shift: ?Total I/O ?In: -  ?Out: 400 [Urine:400] ? ?Vent settings for last 24 hours: ?FiO2 (%):  [35 %-40 %] 40 % ? ?Physical Exam:  ?Gen: comfortable, no distress ?Neuro: moves extremities, no verbal response ?HEENT: PERRL, C collar in place, trach in place, laceration on tongue, no active bleeding ?CV: tachycardic 110s, regular rhythm ?Pulm: unlabored breathing on trach collar ?Abd: soft, NT ?Extr: wwp, no edema ? ? ?Results for orders placed or performed during the hospital encounter of 03/10/22 (from the past 24 hour(s))  ?Glucose, capillary     Status: Abnormal  ? Collection Time: 04/03/22  7:37 PM  ?Result Value Ref Range  ? Glucose-Capillary 127 (H) 70 - 99 mg/dL  ?Glucose, capillary     Status: Abnormal  ? Collection Time: 04/03/22 11:10 PM  ?Result Value Ref Range  ? Glucose-Capillary 137 (H) 70 - 99 mg/dL  ?Basic metabolic panel     Status: Abnormal  ? Collection Time: 04/04/22  2:27 AM  ?Result Value Ref Range  ? Sodium 142 135 - 145 mmol/L  ? Potassium 3.7 3.5 - 5.1 mmol/L  ? Chloride 107 98 - 111 mmol/L  ? CO2 26 22 - 32 mmol/L  ? Glucose, Bld 123 (H) 70 - 99 mg/dL  ? BUN 27 (H) 6 - 20 mg/dL  ? Creatinine, Ser 0.75 0.61 - 1.24 mg/dL  ? Calcium 9.9 8.9 - 10.3 mg/dL  ? GFR, Estimated >60 >60 mL/min  ? Anion gap 9 5 - 15  ?CBC     Status: Abnormal  ? Collection Time: 04/04/22  2:27 AM  ?Result  Value Ref Range  ? WBC 17.6 (H) 4.0 - 10.5 K/uL  ? RBC 4.33 4.22 - 5.81 MIL/uL  ? Hemoglobin 13.3 13.0 - 17.0 g/dL  ? HCT 40.4 39.0 - 52.0 %  ? MCV 93.3 80.0 - 100.0 fL  ? MCH 30.7 26.0 - 34.0 pg  ? MCHC 32.9 30.0 - 36.0 g/dL  ? RDW 12.4 11.5 - 15.5 %  ? Platelets 320 150 - 400 K/uL  ? nRBC 0.0 0.0 - 0.2 %  ?Glucose, capillary     Status: Abnormal  ? Collection Time: 04/04/22  3:43 AM  ?Result Value Ref Range  ? Glucose-Capillary 130 (H) 70 - 99 mg/dL  ?Glucose, capillary     Status: Abnormal  ? Collection Time: 04/04/22  8:07 AM  ?Result Value Ref Range  ? Glucose-Capillary 113 (H) 70 - 99 mg/dL  ?Glucose, capillary     Status: Abnormal  ? Collection Time: 04/04/22 12:30 PM  ?Result Value Ref Range  ? Glucose-Capillary 151 (H) 70 - 99 mg/dL  ?Glucose, capillary     Status: Abnormal  ? Collection Time: 04/04/22  4:07 PM  ?Result Value Ref Range  ? Glucose-Capillary 142 (H) 70 - 99 mg/dL  ? ? ?Assessment &  Plan: ? ?Present on Admission: ? TBI (traumatic brain injury) ([place]) ? ? ? LOS: 25 days  ? ?Additional comments:I reviewed the patient's new clinical lab test results.   and I reviewed the patients new imaging test results.   ? ?[facility] ? 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 Annette Stable, keppra x7d for sz ppx, TBI therapies once extubated. Repeat CT H 3/23 stable. MRI brain 3/27 with microhemorrhages in cortex, corpaus callosum, basal ganglia, midbrain, pons. Dr. Naaman Plummer with PM&R added amantadine 3/31, increased to 200mg  BID 4/3. Exam is improving some. Seroquel weaned to 25mg  qhs. ?C1 TP fx, occipital condyle fx - per Dr. Annette Stable, collar ?Acute ventilator dependent respiratory failure - cont PSV trials, on guaifenisen for thick secretions, mucomyst, robinul and scop patch. Changed to #6 cuffless trach 4/11. ?R ear laceration - S/P repair, completed Clinda x 5d per Dr. Marcelline Deist ?Dislodged PEG - G tube by IR. Meds per tube and TF ?Acute urinary retention - foley out, voiding ?Hyperglycemia - likely from TF,  SSI ?Tongue biting - Bite block in room, patient has had difficulty keeping it in place. Spoke with OMFS 4/14 and only other recs at this point are for MMF with or without an integrated bite block. 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  ?FEN - NPO, TF 70 ml/h (goal 80), SLP ?R post tib and peroneal DVT - tx-ic LMWH ?ID - fever/leukocytosis significantly improved ?  ?Dispo - 4NP, PT/OT/SLP. SNF vs CIR pending progress ? ? ?Michaelle Birks, MD ?[facility] ?General, Hepatobiliary and Pancreatic Surgery ?04/04/22 5:03 PM ? ? ?04/04/2022 ? ?*Care during the described time interval was provided by me. I have reviewed this patient's available data, including medical history, events of note, physical examination and test results as part of my evaluation. ? ? ? ?

## 2022-04-05 LAB — CBC
HCT: 39.2 % (ref 39.0–52.0)
Hemoglobin: 12.6 g/dL — ABNORMAL LOW (ref 13.0–17.0)
MCH: 30.5 pg (ref 26.0–34.0)
MCHC: 32.1 g/dL (ref 30.0–36.0)
MCV: 94.9 fL (ref 80.0–100.0)
Platelets: 280 10*3/uL (ref 150–400)
RBC: 4.13 MIL/uL — ABNORMAL LOW (ref 4.22–5.81)
RDW: 12.5 % (ref 11.5–15.5)
WBC: 11.9 10*3/uL — ABNORMAL HIGH (ref 4.0–10.5)
nRBC: 0 % (ref 0.0–0.2)

## 2022-04-05 LAB — GLUCOSE, CAPILLARY
Glucose-Capillary: 115 mg/dL — ABNORMAL HIGH (ref 70–99)
Glucose-Capillary: 135 mg/dL — ABNORMAL HIGH (ref 70–99)
Glucose-Capillary: 118 mg/dL — ABNORMAL HIGH (ref 70–99)
Glucose-Capillary: 142 mg/dL — ABNORMAL HIGH (ref 70–99)
Glucose-Capillary: 146 mg/dL — ABNORMAL HIGH (ref 70–99)

## 2022-04-05 MED ORDER — ENOXAPARIN SODIUM 120 MG/0.8ML IJ SOSY
110.0000 mg | PREFILLED_SYRINGE | Freq: Two times a day (BID) | INTRAMUSCULAR | Status: DC
  Administered 2022-04-05 – 2022-04-16 (×22): 110 mg via SUBCUTANEOUS
  Filled 2022-04-05 (×24): qty 0.74

## 2022-04-05 NOTE — Progress Notes (Signed)
? ?Trauma/Critical Care Follow Up Note ? ?Subjective:  ?  ?Overnight Issues: No acute changes. Hgb stable. ? ?Objective:  ?Vital signs for last 24 hours: ?Temp:  [98.6 ?F (37 ?C)-99.1 ?F (37.3 ?C)] 98.6 ?F (37 ?C) (04/16 6314) ?Pulse Rate:  [103-116] 109 (04/16 0836) ?Resp:  [20-34] 26 (04/16 0836) ?BP: (117-132)/(84-87) 125/84 (04/16 9702) ?SpO2:  [92 %-96 %] 93 % (04/16 0836) ?FiO2 (%):  [40 %] 40 % (04/16 0836) ? ?Hemodynamic parameters for last 24 hours: ?  ? ?Intake/Output from previous day: ?04/15 0701 - 04/16 0700 ?In: -  ?Out: 1150 [Urine:1150]  ?Intake/Output this shift: ?No intake/output data recorded. ? ?Vent settings for last 24 hours: ?FiO2 (%):  [40 %] 40 % ? ?Physical Exam:  ?Gen: comfortable, no distress ?Neuro: sleeping comfortably ?HEENT: PERRL, C collar in place, trach in place, no bleeding from oropharynx ?CV: tachycardic 110s, regular rhythm ?Pulm: unlabored breathing on trach collar ?Abd: soft, NT, G tube in place with feeds running ?Extr: wwp, no edema ? ? ?Results for orders placed or performed during the hospital encounter of 03/10/22 (from the past 24 hour(s))  ?Glucose, capillary     Status: Abnormal  ? Collection Time: 04/04/22 12:30 PM  ?Result Value Ref Range  ? Glucose-Capillary 151 (H) 70 - 99 mg/dL  ?Glucose, capillary     Status: Abnormal  ? Collection Time: 04/04/22  4:07 PM  ?Result Value Ref Range  ? Glucose-Capillary 142 (H) 70 - 99 mg/dL  ?Glucose, capillary     Status: Abnormal  ? Collection Time: 04/04/22  7:13 PM  ?Result Value Ref Range  ? Glucose-Capillary 127 (H) 70 - 99 mg/dL  ?Glucose, capillary     Status: Abnormal  ? Collection Time: 04/04/22 11:18 PM  ?Result Value Ref Range  ? Glucose-Capillary 131 (H) 70 - 99 mg/dL  ?CBC     Status: Abnormal  ? Collection Time: 04/05/22  2:26 AM  ?Result Value Ref Range  ? WBC 11.9 (H) 4.0 - 10.5 K/uL  ? RBC 4.13 (L) 4.22 - 5.81 MIL/uL  ? Hemoglobin 12.6 (L) 13.0 - 17.0 g/dL  ? HCT 39.2 39.0 - 52.0 %  ? MCV 94.9 80.0 - 100.0 fL  ?  MCH 30.5 26.0 - 34.0 pg  ? MCHC 32.1 30.0 - 36.0 g/dL  ? RDW 12.5 11.5 - 15.5 %  ? Platelets 280 150 - 400 K/uL  ? nRBC 0.0 0.0 - 0.2 %  ?Glucose, capillary     Status: Abnormal  ? Collection Time: 04/05/22  4:00 AM  ?Result Value Ref Range  ? Glucose-Capillary 115 (H) 70 - 99 mg/dL  ?Glucose, capillary     Status: Abnormal  ? Collection Time: 04/05/22  7:46 AM  ?Result Value Ref Range  ? Glucose-Capillary 135 (H) 70 - 99 mg/dL  ? ? ?Assessment & Plan: ? ?Present on Admission: ? TBI (traumatic brain injury) (HCC) ? ? ? LOS: 26 days  ? ?Additional comments:I reviewed the patient's new clinical lab test results.   and I reviewed the patients new imaging test results.   ? ?[facility] ? 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 Jordan Likes, keppra x7d for sz ppx, TBI therapies once extubated. Repeat CT H 3/23 stable. MRI brain 3/27 with microhemorrhages in cortex, corpaus callosum, basal ganglia, midbrain, pons. Dr. Riley Kill with PM&R added amantadine 3/31, increased to 200mg  BID 4/3. Exam is improving some. Seroquel weaned to 25mg  qhs. ?C1 TP fx, occipital condyle fx - per Dr. 6/3,  collar in place. ?Acute ventilator dependent respiratory failure - cont PSV trials, on guaifenisen for thick secretions, mucomyst, robinul and scop patch. Changed to #6 cuffless trach 4/11. ?R ear laceration - S/P repair, completed Clinda x 5d per Dr. Elijah Birk ?Dislodged PEG - G tube by IR. Meds per tube and TF ?Acute urinary retention - foley out, voiding ?Hyperglycemia - likely from TF, SSI ?Tongue biting - Bite block in room. Spoke with OMFS 4/14 and only other recs at this point are for MMF with or without an integrated bite block. 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 No further issues over last 2 days, no active bleeding. ?FEN - NPO, TF 70 ml/h (goal 80), SLP ?R post tib and peroneal DVT - tx-ic LMWH ?ID - fever/leukocytosis significantly improved ?  ?Dispo - 4NP, PT/OT/SLP. SNF vs CIR pending progress ? ? ?Sophronia Simas, MD ?[facility] ?General, Hepatobiliary and Pancreatic Surgery ?04/05/22 11:12 AM ? ? ?04/05/2022 ? ?*Care during the described time interval was provided by me. I have reviewed this patient's available data, including medical history, events of note, physical examination and test results as part of my evaluation. ? ? ? ?

## 2022-04-06 LAB — CBC
HCT: 39.5 % (ref 39.0–52.0)
Hemoglobin: 12.8 g/dL — ABNORMAL LOW (ref 13.0–17.0)
MCH: 30.9 pg (ref 26.0–34.0)
MCHC: 32.4 g/dL (ref 30.0–36.0)
MCV: 95.4 fL (ref 80.0–100.0)
Platelets: 244 10*3/uL (ref 150–400)
RBC: 4.14 MIL/uL — ABNORMAL LOW (ref 4.22–5.81)
RDW: 12.5 % (ref 11.5–15.5)
WBC: 12.7 10*3/uL — ABNORMAL HIGH (ref 4.0–10.5)
nRBC: 0 % (ref 0.0–0.2)

## 2022-04-06 LAB — GLUCOSE, CAPILLARY
Glucose-Capillary: 109 mg/dL — ABNORMAL HIGH (ref 70–99)
Glucose-Capillary: 117 mg/dL — ABNORMAL HIGH (ref 70–99)
Glucose-Capillary: 119 mg/dL — ABNORMAL HIGH (ref 70–99)
Glucose-Capillary: 120 mg/dL — ABNORMAL HIGH (ref 70–99)
Glucose-Capillary: 125 mg/dL — ABNORMAL HIGH (ref 70–99)
Glucose-Capillary: 129 mg/dL — ABNORMAL HIGH (ref 70–99)

## 2022-04-06 MED ORDER — OXYCODONE HCL 5 MG/5ML PO SOLN
2.5000 mg | ORAL | Status: DC | PRN
  Administered 2022-04-06 – 2022-04-10 (×2): 5 mg
  Filled 2022-04-06 (×2): qty 5

## 2022-04-06 MED ORDER — METOPROLOL TARTRATE 25 MG/10 ML ORAL SUSPENSION
25.0000 mg | Freq: Two times a day (BID) | ORAL | Status: DC
  Administered 2022-04-06 (×2): 25 mg
  Filled 2022-04-06 (×3): qty 10
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
## 2022-04-06 NOTE — Progress Notes (Signed)
? ?Progress Note ? ?19 Days Post-Op  ?Subjective: ?Stable, bit tongue once overnight but no active bleeding at the moment. ? ?Objective: ?Vital signs in last 24 hours: ?Temp:  [98.5 ?F (36.9 ?C)-99.2 ?F (37.3 ?C)] 99 ?F (37.2 ?C) (04/17 1200) ?Pulse Rate:  [90-124] 124 (04/17 1200) ?Resp:  [15-30] 23 (04/17 1200) ?BP: (115-158)/(89-107) 155/91 (04/17 0709) ?SpO2:  [90 %-97 %] 93 % (04/17 1200) ?FiO2 (%):  [40 %] 40 % (04/17 0810) ?Weight:  [111.1 kg] 111.1 kg (04/17 0356) ?Last BM Date : 04/06/22 ? ?Intake/Output from previous day: ?04/16 0701 - 04/17 0700 ?In: 6960 [NG/GT:6960] ?Out: 1525 [Urine:825; Stool:700] ?Intake/Output this shift: ?Total I/O ?In: 400 [NG/GT:400] ?Out: -  ? ?PE: ?General: WD/WN male, NAD ?ENT: tongue with some scab present this AM but no active bleeding  ?Neck: collar present ?Heart: sinus tachycardia in the low 100s. Palpable radial and pedal pulses bilaterally ?Lungs: CTAB, no wheezes, rhonchi, or rales noted. Trach present, thick secretions, strong cough ?Abd: soft, NT, ND, +BS, PEG present and without leakage around, binder present  ?MS: all 4 extremities are symmetrical with no cyanosis, clubbing, or edema. ?Skin: warm and dry with no masses, lesions, or rashes ?Neuro: delayed following of commands with LUE and some tracking with eyes, localized to pain with LUE. no attempt of verbalization ? ? ?Lab Results:  ?Recent Labs  ?  04/05/22 ?0226 04/06/22 ?0331  ?WBC 11.9* 12.7*  ?HGB 12.6* 12.8*  ?HCT 39.2 39.5  ?PLT 280 244  ? ?BMET ?Recent Labs  ?  04/04/22 ?0227  ?NA 142  ?K 3.7  ?CL 107  ?CO2 26  ?GLUCOSE 123*  ?BUN 27*  ?CREATININE 0.75  ?CALCIUM 9.9  ? ?PT/INR ?No results for input(s): LABPROT, INR in the last 72 hours. ?CMP  ?   ?Component Value Date/Time  ? NA 142 04/04/2022 0227  ? K 3.7 04/04/2022 0227  ? CL 107 04/04/2022 0227  ? CO2 26 04/04/2022 0227  ? GLUCOSE 123 (H) 04/04/2022 0227  ? BUN 27 (H) 04/04/2022 0227  ? CREATININE 0.75 04/04/2022 0227  ? CALCIUM 9.9 04/04/2022 0227   ? PROT 7.8 03/10/2022 2117  ? ALBUMIN 4.8 03/10/2022 2117  ? AST 42 (H) 03/10/2022 2117  ? ALT 43 03/10/2022 2117  ? ALKPHOS 67 03/10/2022 2117  ? BILITOT 0.6 03/10/2022 2117  ? GFRNONAA >60 04/04/2022 0227  ? ?Lipase  ?No results found for: LIPASE ? ? ? ? ?Studies/Results: ?No results found. ? ?Anti-infectives: ?Anti-infectives (From admission, onward)  ? ? Start     Dose/Rate Route Frequency Ordered Stop  ? 03/14/22 1015  ceFEPIme (MAXIPIME) 2 g in sodium chloride 0.9 % 100 mL IVPB  Status:  Discontinued       ? 2 g ?200 mL/hr over 30 Minutes Intravenous Every 12 hours 03/14/22 0926 03/14/22 0934  ? 03/14/22 1000  ceFEPIme (MAXIPIME) 2 g in sodium chloride 0.9 % 100 mL IVPB  Status:  Discontinued       ? 2 g ?200 mL/hr over 30 Minutes Intravenous Every 8 hours 03/14/22 0935 03/16/22 1017  ? 03/11/22 0915  clindamycin (CLEOCIN) 75 MG/5ML solution 150 mg       ? 150 mg Per Tube Every 8 hours 03/11/22 0823 03/15/22 2131  ? 03/10/22 2215  ceFAZolin (ANCEF) IVPB 2g/100 mL premix       ? 2 g ?200 mL/hr over 30 Minutes Intravenous  Once 03/10/22 2202 03/10/22 2307  ? ?  ? ? ? ?Assessment/Plan ?[facility] ?  ?  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 Annette Stable, keppra x7d for sz ppx, TBI therapies once extubated. Repeat CT H 3/23 stable. MRI brain 3/27 with microhemorrhages in cortex, corpaus callosum, basal ganglia, midbrain, pons. Dr. Naaman Plummer with PM&R added amantadine 3/31, increased to 200mg  BID 4/3. Exam is improving some. Seroquel weaned to 25mg  qhs. Decreasing oxy scale.  ?C1 TP fx, occipital condyle fx - per Dr. Annette Stable, [facility] collar in place. ?Acute ventilator dependent respiratory failure s/p trach- cont PSV trials, on guaifenisen for thick secretions, mucomyst, robinul and scop patch. Changed to #6 cuffless trach 4/11. Will downsize to #4CL today  ?R ear laceration - S/P repair, completed Clinda x 5d per Dr. Marcelline Deist ?Hyperglycemia - likely from TF, SSI ?Tongue biting - Bite block in room, continue to use as able.  Spoke with OMFS 4/14 and only other recs at this point are for MMF with or without an integrated bite block. 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 No further issues over last 2 days, no active bleeding. ? ?FEN - NPO, TF 70 ml/h (goal 80) per PEG, SLP ?R post tib and peroneal DVT - tx-ic LMWH ?ID - fever/leukocytosis significantly improved ?  ?Dispo - 4NP, PT/OT/SLP. SNF vs CIR pending progress  ? LOS: 27 days  ? ?I reviewed CBC. Vitals and meds for last 24 hrs.  ? ? ?Norm Parcel, PA-C ?[facility] ?04/06/2022, 12:52 PM ?Please see Amion for pager number during day hours 7:00am-4:30pm ? ?

## 2022-04-06 NOTE — Procedures (Signed)
Tracheostomy Change Note ? ?Patient Details:   ?Name: Abed Croxford ?DOB: 1986-02-12 ?MRN: [id number] ?   ?Airway Documentation: ?Trach downsized from 6 cfs shiley to a 4 cfs shiley with no problems. Confirmed by color change on EZcap manometer along with suctioning copious amounts of secretions. Moises Blood RRT assisted with trach change.  ? ?Evaluation ? O2 sats: stable throughout ?Complications: No apparent complications ?Patient did tolerate procedure well. ?Bilateral Breath Sounds: Rhonchi ?  ? ?Kathie Dike ?04/06/2022, 3:14 PM ?

## 2022-04-07 LAB — GLUCOSE, CAPILLARY
Glucose-Capillary: 107 mg/dL — ABNORMAL HIGH (ref 70–99)
Glucose-Capillary: 107 mg/dL — ABNORMAL HIGH (ref 70–99)
Glucose-Capillary: 109 mg/dL — ABNORMAL HIGH (ref 70–99)
Glucose-Capillary: 113 mg/dL — ABNORMAL HIGH (ref 70–99)
Glucose-Capillary: 130 mg/dL — ABNORMAL HIGH (ref 70–99)
Glucose-Capillary: 138 mg/dL — ABNORMAL HIGH (ref 70–99)
Glucose-Capillary: 99 mg/dL (ref 70–99)

## 2022-04-07 LAB — CBC
HCT: 39.1 % (ref 39.0–52.0)
Hemoglobin: 12.7 g/dL — ABNORMAL LOW (ref 13.0–17.0)
MCH: 30.8 pg (ref 26.0–34.0)
MCHC: 32.5 g/dL (ref 30.0–36.0)
MCV: 94.9 fL (ref 80.0–100.0)
Platelets: 244 10*3/uL (ref 150–400)
RBC: 4.12 MIL/uL — ABNORMAL LOW (ref 4.22–5.81)
RDW: 12.7 % (ref 11.5–15.5)
WBC: 12.4 10*3/uL — ABNORMAL HIGH (ref 4.0–10.5)
nRBC: 0 % (ref 0.0–0.2)

## 2022-04-07 MED ORDER — METOPROLOL TARTRATE 25 MG/10 ML ORAL SUSPENSION
50.0000 mg | Freq: Two times a day (BID) | ORAL | Status: DC
  Administered 2022-04-07 – 2022-05-08 (×63): 50 mg
  Filled 2022-04-07 (×65): qty 20
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
## 2022-04-08 LAB — CBC
HCT: 37.6 % — ABNORMAL LOW (ref 39.0–52.0)
Hemoglobin: 12.1 g/dL — ABNORMAL LOW (ref 13.0–17.0)
MCH: 30.5 pg (ref 26.0–34.0)
MCHC: 32.2 g/dL (ref 30.0–36.0)
MCV: 94.7 fL (ref 80.0–100.0)
Platelets: 238 10*3/uL (ref 150–400)
RBC: 3.97 MIL/uL — ABNORMAL LOW (ref 4.22–5.81)
RDW: 12.5 % (ref 11.5–15.5)
WBC: 9.8 10*3/uL (ref 4.0–10.5)
nRBC: 0 % (ref 0.0–0.2)

## 2022-04-08 LAB — GLUCOSE, CAPILLARY
Glucose-Capillary: 127 mg/dL — ABNORMAL HIGH (ref 70–99)
Glucose-Capillary: 117 mg/dL — ABNORMAL HIGH (ref 70–99)
Glucose-Capillary: 132 mg/dL — ABNORMAL HIGH (ref 70–99)
Glucose-Capillary: 116 mg/dL — ABNORMAL HIGH (ref 70–99)
Glucose-Capillary: 107 mg/dL — ABNORMAL HIGH (ref 70–99)
Glucose-Capillary: 120 mg/dL — ABNORMAL HIGH (ref 70–99)

## 2022-04-08 NOTE — Progress Notes (Signed)
Trauma Event Note ? ? ?TRN at bedside to round. VSS, family at bedside. No needs at this time. ? ?Last imported Vital Signs ?BP 119/84 (BP Location: Left Arm)   Pulse 93   Temp 98.2 ?F (36.8 ?C) (Oral)   Resp 20   Ht 6\' 5"  (1.956 m)   Wt 244 lb 14.9 oz (111.1 kg)   SpO2 97%   BMI 29.04 kg/m?  ? ?Trending CBC ?Recent Labs  ?  04/05/22 ?0226 04/06/22 ?0331 04/07/22 ?0333  ?WBC 11.9* 12.7* 12.4*  ?HGB 12.6* 12.8* 12.7*  ?HCT 39.2 39.5 39.1  ?PLT 280 244 244  ? ? ?Trending Coag's ?No results for input(s): APTT, INR in the last 72 hours. ? ?Trending BMET ?No results for input(s): NA, K, CL, CO2, BUN, CREATININE, GLUCOSE in the last 72 hours. ? ? ? ?Carl Carpenter O Carl Carpenter  ?Trauma Response RN ? ?Please call TRN at [phone] for further assistance. ? ? ?  ?
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
## 2022-04-09 LAB — GLUCOSE, CAPILLARY
Glucose-Capillary: 116 mg/dL — ABNORMAL HIGH (ref 70–99)
Glucose-Capillary: 116 mg/dL — ABNORMAL HIGH (ref 70–99)
Glucose-Capillary: 119 mg/dL — ABNORMAL HIGH (ref 70–99)
Glucose-Capillary: 120 mg/dL — ABNORMAL HIGH (ref 70–99)
Glucose-Capillary: 103 mg/dL — ABNORMAL HIGH (ref 70–99)
Glucose-Capillary: 129 mg/dL — ABNORMAL HIGH (ref 70–99)

## 2022-04-09 MED ORDER — RACEPINEPHRINE HCL 2.25 % IN NEBU
0.5000 mL | INHALATION_SOLUTION | Freq: Once | RESPIRATORY_TRACT | Status: AC
Start: 2022-04-09 — End: 2022-04-09
  Administered 2022-04-09: 0.5 mL via RESPIRATORY_TRACT
  Filled 2022-04-09: qty 0.5

## 2022-04-09 MED ORDER — "TRANEXAMIC ACID 5% ORAL SOLUTION "
10.0000 mL | Freq: Once | ORAL | Status: AC
  Administered 2022-04-09: 10 mL via OROMUCOSAL
  Filled 2022-04-09: qty 10

## 2022-04-09 MED ORDER — CHLORHEXIDINE GLUCONATE 0.12 % MT SOLN
OROMUCOSAL | Status: AC
  Filled 2022-04-09: qty 15

## 2022-04-09 MED ORDER — SILVER NITRATE-POT NITRATE 75-25 % EX MISC
1.0000 "application " | Freq: Once | CUTANEOUS | Status: AC
  Administered 2022-04-09: 1 via TOPICAL
  Filled 2022-04-09 (×2): qty 1

## 2022-04-09 MED ORDER — ACETAMINOPHEN 160 MG/5ML PO SOLN
1000.0000 mg | Freq: Four times a day (QID) | ORAL | Status: DC | PRN
  Administered 2022-04-09 – 2022-05-04 (×21): 1000 mg
  Filled 2022-04-09 (×22): qty 40.6
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
## 2022-04-09 NOTE — Progress Notes (Signed)
Patient ID: Carl Carpenter, male   DOB: 1986/02/20, 36 y.o.   MRN: [id number] ?Notified by RT of further tongue bleeding. There is a raw area centrally and on R side. It has slowed down some. His anticoagulation is complicating this. I have asked Dr. Marene Lenz from ENT to consult. ?I spoke with his parents. ? ?Violeta Gelinas, MD, MPH, FACS ?Please use [email or web address] to contact on call provider ? ?

## 2022-04-09 NOTE — Progress Notes (Signed)
Pt still having fresh blood in his mouth and draining/suctioning from trach also.  Per mother in room, she is very concerned and mentioned that she would like  ENT to evaluate patient.  Per Mom, his upper lip appears bruised from an oral airway placed earlier today (not sure who placed the oral airway earlier, but I did not find it in currently in place). RN and MD notified.   ?
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
## 2022-04-09 NOTE — Progress Notes (Signed)
? ?Progress Note ? ?22 Days Post-Op  ?Subjective: ?Stable. Still struggling with some bleeding intermittently from tongue when he scrapes the scab off. Family reports he was more alert in the afternoon yesterday. Tolerating TF and having bowel functions.  ? ?Objective: ?Vital signs in last 24 hours: ?Temp:  [97.7 ?F (36.5 ?C)-99.4 ?F (37.4 ?C)] 99.4 ?F (37.4 ?C) (04/20 0334) ?Pulse Rate:  [90-107] 95 (04/20 0445) ?Resp:  [15-25] 20 (04/20 0445) ?BP: (110-128)/(77-85) 128/85 (04/20 0334) ?SpO2:  [91 %-99 %] 94 % (04/20 0445) ?FiO2 (%):  [35 %-40 %] 35 % (04/20 0445) ?Last BM Date : 04/08/22 ? ?Intake/Output from previous day: ?04/19 0701 - 04/20 0700 ?In: 480 [NG/GT:480] ?Out: 1450 [Urine:1450] ?Intake/Output this shift: ?No intake/output data recorded. ? ?PE: ?General: WD/WN male, NAD ?ENT: tongue with some minor bleeding - gauze applied  ?Neck: collar present ?Heart: RRR. Palpable radial and pedal pulses bilaterally ?Lungs: CTAB, no wheezes, rhonchi, or rales noted. Trach present, thick secretions, strong cough ?Abd: soft, NT, ND, +BS, PEG present and without leakage around, binder present  ?MS: all 4 extremities are symmetrical with no cyanosis, clubbing, or edema. ?Skin: warm and dry with no masses, lesions, or rashes ?Neuro: gripped my hand on the left today, no localization to pain, did not open eyes or follow commands ? ? ?Lab Results:  ?Recent Labs  ?  04/07/22 ?0333 04/08/22 ?0425  ?WBC 12.4* 9.8  ?HGB 12.7* 12.1*  ?HCT 39.1 37.6*  ?PLT 244 238  ? ? ?BMET ?No results for input(s): NA, K, CL, CO2, GLUCOSE, BUN, CREATININE, CALCIUM in the last 72 hours. ? ?PT/INR ?No results for input(s): LABPROT, INR in the last 72 hours. ?CMP  ?   ?Component Value Date/Time  ? NA 142 04/04/2022 0227  ? K 3.7 04/04/2022 0227  ? CL 107 04/04/2022 0227  ? CO2 26 04/04/2022 0227  ? GLUCOSE 123 (H) 04/04/2022 0227  ? BUN 27 (H) 04/04/2022 0227  ? CREATININE 0.75 04/04/2022 0227  ? CALCIUM 9.9 04/04/2022 0227  ? PROT 7.8  03/10/2022 2117  ? ALBUMIN 4.8 03/10/2022 2117  ? AST 42 (H) 03/10/2022 2117  ? ALT 43 03/10/2022 2117  ? ALKPHOS 67 03/10/2022 2117  ? BILITOT 0.6 03/10/2022 2117  ? GFRNONAA >60 04/04/2022 0227  ? ?Lipase  ?No results found for: LIPASE ? ? ? ? ?Studies/Results: ?No results found. ? ?Anti-infectives: ?Anti-infectives (From admission, onward)  ? ? Start     Dose/Rate Route Frequency Ordered Stop  ? 03/14/22 1015  ceFEPIme (MAXIPIME) 2 g in sodium chloride 0.9 % 100 mL IVPB  Status:  Discontinued       ? 2 g ?200 mL/hr over 30 Minutes Intravenous Every 12 hours 03/14/22 0926 03/14/22 0934  ? 03/14/22 1000  ceFEPIme (MAXIPIME) 2 g in sodium chloride 0.9 % 100 mL IVPB  Status:  Discontinued       ? 2 g ?200 mL/hr over 30 Minutes Intravenous Every 8 hours 03/14/22 0935 03/16/22 1017  ? 03/11/22 0915  clindamycin (CLEOCIN) 75 MG/5ML solution 150 mg       ? 150 mg Per Tube Every 8 hours 03/11/22 0823 03/15/22 2131  ? 03/10/22 2215  ceFAZolin (ANCEF) IVPB 2g/100 mL premix       ? 2 g ?200 mL/hr over 30 Minutes Intravenous  Once 03/10/22 2202 03/10/22 2307  ? ?  ? ? ? ?Assessment/Plan ?[facility] ?  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 Jordan Likes, keppra x7d for sz ppx, TBI  therapies once extubated. Repeat CT H 3/23 stable. MRI brain 3/27 with microhemorrhages in cortex, corpaus callosum, basal ganglia, midbrain, pons. Dr. Riley Kill with PM&R added amantadine 3/31, increased to 200mg  BID 4/3. Exam is improving some. Seroquel weaned to 25mg  qhs, will discuss discontinuing this with MD today  ?C1 TP fx, occipital condyle fx - per Dr. 6/3, [facility] J collar in place. ?Acute ventilator dependent respiratory failure s/p trach- cont PSV trials, on guaifenisen for thick secretions, mucomyst, robinul. Changed to #4 cuffless trach 4/17.  ?R ear laceration - S/P repair, completed Clinda x 5d per Dr. [facility] ?Hyperglycemia - likely from TF, SSI ?Tongue biting - Bite block in room, continue to use as able. Spoke with OMFS 4/14 and only  other recs at this point are for MMF with or without an integrated bite block. 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 Continue to monitor, keep silver nitrate applicators at bedside  ? ?FEN - NPO, TF 80 ml/h per PEG, SLP ?R post tib and peroneal DVT - tx-ic LMWH ?ID - fever/leukocytosis significantly improved ?  ?Dispo - 4NP, PT/OT/SLP. SNF vs CIR pending progress  ? LOS: 30 days  ? ?I reviewed vitals and meds for last 24 hrs.  ? ? ?Elijah Birk, PA-C ?[facility] ?04/09/2022, 8:16 AM ?Please see Amion for pager number during day hours 7:00am-4:30pm ? ?

## 2022-04-09 NOTE — Progress Notes (Signed)
While assessing pt, pt has gauze in mouth saturated with blood, pt yawned and father at bedside pulled out gauze.  Noted large blood clots on gauze and blood in mouth.  Attempted to oral sx the blood and father replaced dry gauze in mouth.  Sx trach for moderate amount of fresh blood from trach.  Per father, pt has been biting tongue and bleeding- draining into airway.  I discussed this w/ Trauma Dr Vernona Rieger aware.  No distress noted currently.  Unit secretary ordering inner cannulas.   ?

## 2022-04-10 LAB — BASIC METABOLIC PANEL
Anion gap: 8 (ref 5–15)
BUN: 26 mg/dL — ABNORMAL HIGH (ref 6–20)
CO2: 29 mmol/L (ref 22–32)
Calcium: 9.7 mg/dL (ref 8.9–10.3)
Chloride: 106 mmol/L (ref 98–111)
Creatinine, Ser: 0.72 mg/dL (ref 0.61–1.24)
GFR, Estimated: >60 mL/min (ref >60)
Glucose, Bld: 121 mg/dL — ABNORMAL HIGH (ref 70–99)
Potassium: 3.8 mmol/L (ref 3.5–5.1)
Sodium: 143 mmol/L (ref 135–145)

## 2022-04-10 LAB — CBC
HCT: 39.6 % (ref 39.0–52.0)
Hemoglobin: 12.7 g/dL — ABNORMAL LOW (ref 13.0–17.0)
MCH: 30.8 pg (ref 26.0–34.0)
MCHC: 32.1 g/dL (ref 30.0–36.0)
MCV: 96.1 fL (ref 80.0–100.0)
Platelets: 286 10*3/uL (ref 150–400)
RBC: 4.12 MIL/uL — ABNORMAL LOW (ref 4.22–5.81)
RDW: 12.7 % (ref 11.5–15.5)
WBC: 9.9 10*3/uL (ref 4.0–10.5)
nRBC: 0 % (ref 0.0–0.2)

## 2022-04-10 LAB — GLUCOSE, CAPILLARY
Glucose-Capillary: 138 mg/dL — ABNORMAL HIGH (ref 70–99)
Glucose-Capillary: 141 mg/dL — ABNORMAL HIGH (ref 70–99)
Glucose-Capillary: 137 mg/dL — ABNORMAL HIGH (ref 70–99)
Glucose-Capillary: 117 mg/dL — ABNORMAL HIGH (ref 70–99)
Glucose-Capillary: 135 mg/dL — ABNORMAL HIGH (ref 70–99)
Glucose-Capillary: 139 mg/dL — ABNORMAL HIGH (ref 70–99)

## 2022-04-10 MED ORDER — KETOROLAC TROMETHAMINE 15 MG/ML IJ SOLN: 15.0000 mg | Freq: Three times a day (TID) | INTRAMUSCULAR | Status: AC | PRN
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
## 2022-04-10 NOTE — Progress Notes (Signed)
Pt had minimal bleeding episode from the tongue biting, pt biting block was placed in mouth along with the gauze and no bleeding since. Reported off to oncoming RN. Delia Heady RN ?
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
## 2022-04-10 NOTE — Progress Notes (Addendum)
..  Trauma Event Note ? ? ? ?Reason for Call : ? ?Tongue bleeding ? ?Initial Focused Assessment: ? ?Moderate - large blood clot expelled from oral cavity after coughing per mother at bedside, oral and trach suctioning provided by 4NP staff. Bloody secretions suctioned from trach ? ?Interventions: ?Direct observation-center of tongue actively bleeding.  ?Mother able to manually hold pressure with TXA guaze and without. For short periods of time. Guaze roll in mouth resting on tongue at this time.  ? ? ?Plan of Care: ?Dr. Janee Morn notified of re/continued bleeding despite ENT's recommendations of TXA administration, Racemic epi neb and silver nitrate cauterization.  ? ? ?Event Summary: ? ?Call to assist staff with actively/rebleeding area of tongue. Pt expelled clot with cough, trach suctioned. All suggested interventions attempted without success. Dr. Janee Morn notified, silver nitrate at bedside.  ? ? ?Last imported Vital Signs ?BP 121/90   Pulse (!) 104   Temp 99.4 ?F (37.4 ?C) (Oral)   Resp (!) 25   Ht 6\' 5"  (1.956 m)   Wt 244 lb 14.9 oz (111.1 kg)   SpO2 94%   BMI 29.04 kg/m?  ? ?Trending CBC ?Recent Labs  ?  04/07/22 ?0333 04/08/22 ?0425  ?WBC 12.4* 9.8  ?HGB 12.7* 12.1*  ?HCT 39.1 37.6*  ?PLT 244 238  ? ? ?Trending Coag's ?No results for input(s): APTT, INR in the last 72 hours. ? ?Trending BMET ?No results for input(s): NA, K, CL, CO2, BUN, CREATININE, GLUCOSE in the last 72 hours. ? ? ? ?Gomer France Dee  ?Trauma Response RN ? ?Please call TRN at [phone] for further assistance. ? ? ?  ?
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
## 2022-04-11 LAB — GLUCOSE, CAPILLARY
Glucose-Capillary: 119 mg/dL — ABNORMAL HIGH (ref 70–99)
Glucose-Capillary: 121 mg/dL — ABNORMAL HIGH (ref 70–99)
Glucose-Capillary: 128 mg/dL — ABNORMAL HIGH (ref 70–99)
Glucose-Capillary: 129 mg/dL — ABNORMAL HIGH (ref 70–99)
Glucose-Capillary: 135 mg/dL — ABNORMAL HIGH (ref 70–99)
Glucose-Capillary: 150 mg/dL — ABNORMAL HIGH (ref 70–99)

## 2022-04-12 LAB — GLUCOSE, CAPILLARY
Glucose-Capillary: 104 mg/dL — ABNORMAL HIGH (ref 70–99)
Glucose-Capillary: 115 mg/dL — ABNORMAL HIGH (ref 70–99)
Glucose-Capillary: 116 mg/dL — ABNORMAL HIGH (ref 70–99)
Glucose-Capillary: 121 mg/dL — ABNORMAL HIGH (ref 70–99)
Glucose-Capillary: 133 mg/dL — ABNORMAL HIGH (ref 70–99)
Glucose-Capillary: 138 mg/dL — ABNORMAL HIGH (ref 70–99)
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
## 2022-04-13 LAB — GLUCOSE, CAPILLARY
Glucose-Capillary: 116 mg/dL — ABNORMAL HIGH (ref 70–99)
Glucose-Capillary: 120 mg/dL — ABNORMAL HIGH (ref 70–99)
Glucose-Capillary: 123 mg/dL — ABNORMAL HIGH (ref 70–99)
Glucose-Capillary: 123 mg/dL — ABNORMAL HIGH (ref 70–99)
Glucose-Capillary: 128 mg/dL — ABNORMAL HIGH (ref 70–99)
Glucose-Capillary: 136 mg/dL — ABNORMAL HIGH (ref 70–99)
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
## 2022-04-13 NOTE — Progress Notes (Signed)
? ?Progress Note ? ?26 Days Post-Op  ?Subjective: ?Stable, no acute change. Dad at bedside and states less responsive over last 2-3 days. ? ?Objective: ?Vital signs in last 24 hours: ?Temp:  [99.1 ?F (37.3 ?C)-100.6 ?F (38.1 ?C)] 100.4 ?F (38 ?C) (04/24 0326) ?Pulse Rate:  [101-121] 102 (04/24 0830) ?Resp:  [20-28] 24 (04/24 0830) ?BP: (123-139)/(81-86) 123/81 (04/24 4492) ?SpO2:  [93 %-95 %] 95 % (04/24 0830) ?FiO2 (%):  [40 %] 40 % (04/24 0830) ?Last BM Date : 04/13/22 ? ?Intake/Output from previous day: ?04/23 0701 - 04/24 0700 ?In: 3397 [NG/GT:3167] ?Out: 1100 [Urine:1100] ?Intake/Output this shift: ?Total I/O ?In: 1080 [NG/GT:1080] ?Out: -  ? ?PE: ?General: WD/WN male, NAD ?ENT: rolled gauze present, no blood noted, Pupils reactive ?Neck: collar present ?Heart: RRR. Palpable radial and pedal pulses bilaterally ?Lungs: CTAB, no wheezes, rhonchi, or rales noted. Trach present,  ?Abd: soft, NT, ND, +BS, PEG present and without leakage around, binder present  ?MS: all 4 extremities are symmetrical with no cyanosis, clubbing, or edema. ?Skin: warm and dry with no masses, lesions, or rashes ?Neuro: not FC, no localization to pain  ? ? ?Lab Results:  ?No results for input(s): WBC, HGB, HCT, PLT in the last 72 hours. ?BMET ?No results for input(s): NA, K, CL, CO2, GLUCOSE, BUN, CREATININE, CALCIUM in the last 72 hours. ?PT/INR ?No results for input(s): LABPROT, INR in the last 72 hours. ?CMP  ?   ?Component Value Date/Time  ? NA 143 04/10/2022 0300  ? K 3.8 04/10/2022 0300  ? CL 106 04/10/2022 0300  ? CO2 29 04/10/2022 0300  ? GLUCOSE 121 (H) 04/10/2022 0300  ? BUN 26 (H) 04/10/2022 0300  ? CREATININE 0.72 04/10/2022 0300  ? CALCIUM 9.7 04/10/2022 0300  ? PROT 7.8 03/10/2022 2117  ? ALBUMIN 4.8 03/10/2022 2117  ? AST 42 (H) 03/10/2022 2117  ? ALT 43 03/10/2022 2117  ? ALKPHOS 67 03/10/2022 2117  ? BILITOT 0.6 03/10/2022 2117  ? GFRNONAA >60 04/10/2022 0300  ? ?Lipase  ?No results found for:  LIPASE ? ? ? ? ?Studies/Results: ?No results found. ? ?Anti-infectives: ?Anti-infectives (From admission, onward)  ? ? Start     Dose/Rate Route Frequency Ordered Stop  ? 03/14/22 1015  ceFEPIme (MAXIPIME) 2 g in sodium chloride 0.9 % 100 mL IVPB  Status:  Discontinued       ? 2 g ?200 mL/hr over 30 Minutes Intravenous Every 12 hours 03/14/22 0926 03/14/22 0934  ? 03/14/22 1000  ceFEPIme (MAXIPIME) 2 g in sodium chloride 0.9 % 100 mL IVPB  Status:  Discontinued       ? 2 g ?200 mL/hr over 30 Minutes Intravenous Every 8 hours 03/14/22 0935 03/16/22 1017  ? 03/11/22 0915  clindamycin (CLEOCIN) 75 MG/5ML solution 150 mg       ? 150 mg Per Tube Every 8 hours 03/11/22 0823 03/15/22 2131  ? 03/10/22 2215  ceFAZolin (ANCEF) IVPB 2g/100 mL premix       ? 2 g ?200 mL/hr over 30 Minutes Intravenous  Once 03/10/22 2202 03/10/22 2307  ? ?  ? ? ? ?Assessment/Plan ?[facility] ?  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 Jordan Likes, keppra x7d for sz ppx, TBI therapies once extubated. Repeat CT H 3/23 stable. MRI brain 3/27 with microhemorrhages in cortex, corpaus callosum, basal ganglia, midbrain, pons. Dr. Riley Kill with PM&R added amantadine 3/31, increased to 200mg  BID 4/3. Exam is improving some. Seroquel discontinued 4/20. DC oxycodone and use tylenol  or toradol prn for pain if needed. will repeat a CT of his head today due to decrease in neuro status for last couple of days.   ?C1 TP fx, occipital condyle fx - per Dr. Jordan Likes, [facility] J collar in place. ?Acute ventilator dependent respiratory failure s/p trach- cont PSV trials, on guaifenisen for thick secretions, mucomyst, robinul. Changed to #4 cuffless trach 4/17.  ?R ear laceration - S/P repair, completed Clinda x 5d per Dr. Elijah Birk ?Hyperglycemia - likely from TF, SSI ?Tongue biting - Bite block in room, continue to use as able. Spoke with OMFS 4/14 and only other recs at this point are for MMF with or without an integrated bite block. 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 Continue to monitor, keep silver nitrate applicators at bedside. ENT consulted overnight. Hgb stable at 12.7  gauze still in place with no further bleeding. ?  ?FEN - NPO, TF 80 ml/h per PEG, SLP ?R post tib and peroneal DVT - therapeutic LMWH ?ID - afebrile, leukocytosis resolved ?  ?Dispo - 4NP, PT/OT/SLP. SNF vs CIR pending progress   ? LOS: 34 days  ? ?I reviewed vitals and meds for last 24 hrs. Reviewed prior imaging. ? ? ?Letha Cape, PA-C ?[facility] ?04/13/2022, 10:42 AM ?Please see Amion for pager number during day hours 7:00am-4:30pm ? ?

## 2022-04-14 ENCOUNTER — Inpatient Hospital Stay (HOSPITAL_COMMUNITY): Payer: Medicaid Other

## 2022-04-14 LAB — URINALYSIS, ROUTINE W REFLEX MICROSCOPIC
Bilirubin Urine: NEGATIVE
Glucose, UA: NEGATIVE mg/dL
Hgb urine dipstick: NEGATIVE
Ketones, ur: NEGATIVE mg/dL
Leukocytes,Ua: NEGATIVE
Nitrite: NEGATIVE
Protein, ur: NEGATIVE mg/dL
Specific Gravity, Urine: 1.02 (ref 1.005–1.030)
pH: 6 (ref 5.0–8.0)

## 2022-04-14 LAB — GLUCOSE, CAPILLARY
Glucose-Capillary: 140 mg/dL — ABNORMAL HIGH (ref 70–99)
Glucose-Capillary: 127 mg/dL — ABNORMAL HIGH (ref 70–99)
Glucose-Capillary: 133 mg/dL — ABNORMAL HIGH (ref 70–99)
Glucose-Capillary: 141 mg/dL — ABNORMAL HIGH (ref 70–99)
Glucose-Capillary: 126 mg/dL — ABNORMAL HIGH (ref 70–99)

## 2022-04-14 LAB — CBC
HCT: 36.5 % — ABNORMAL LOW (ref 39.0–52.0)
Hemoglobin: 11.8 g/dL — ABNORMAL LOW (ref 13.0–17.0)
MCH: 31.4 pg (ref 26.0–34.0)
MCHC: 32.3 g/dL (ref 30.0–36.0)
MCV: 97.1 fL (ref 80.0–100.0)
Platelets: 253 10*3/uL (ref 150–400)
RBC: 3.76 MIL/uL — ABNORMAL LOW (ref 4.22–5.81)
RDW: 13.2 % (ref 11.5–15.5)
WBC: 15.8 10*3/uL — ABNORMAL HIGH (ref 4.0–10.5)
nRBC: 0 % (ref 0.0–0.2)

## 2022-04-14 MED ORDER — SODIUM CHLORIDE 0.9 % IV SOLN
2.0000 g | Freq: Three times a day (TID) | INTRAVENOUS | Status: DC
  Administered 2022-04-14 – 2022-04-16 (×6): 2 g via INTRAVENOUS
  Filled 2022-04-14 (×5): qty 12.5

## 2022-04-14 MED ORDER — METRONIDAZOLE 500 MG/100ML IV SOLN
500.0000 mg | Freq: Two times a day (BID) | INTRAVENOUS | Status: DC
  Administered 2022-04-15: 500 mg via INTRAVENOUS
  Filled 2022-04-14: qty 100

## 2022-04-14 MED ORDER — HYDROMORPHONE HCL 1 MG/ML IJ SOLN
1.0000 mg | Freq: Once | INTRAMUSCULAR | Status: AC
  Administered 2022-04-14: 1 mg via INTRAVENOUS
  Filled 2022-04-14: qty 1

## 2022-04-14 MED ORDER — PIPERACILLIN-TAZOBACTAM 3.375 G IVPB: 3.3750 g | Freq: Three times a day (TID) | INTRAVENOUS | Status: DC

## 2022-04-14 NOTE — TOC Progression Note (Signed)
Transition of Care (TOC) - Progression Note  ? ? ?Patient Details  ?Name: Carl Carpenter ?MRN: [id number] ?Date of Birth: 11/21/1986 ? ?Transition of Care (TOC) CM/SW Contact  ?Glennon Mac, RN ?Phone Number: ?04/14/2022, 5:05 PM ? ?Clinical Narrative:    ?Long conversation with patient's dad at bedside.  We discussed that patient currently is not able to tolerate therapy requirements for inpatient rehab, and will likely need SNF for rehab.  He is realistic about this, and wants to talk with patient's mom and girlfriend to determine disposition.  He states that he and his wife live in [place], and [place] lives in [place], so he is uncertain where they would prefer placement.  Dad states he believes that patient has received a Medicaid card in the mail; he states he or his wife will bring this in to update demographic/insurance information.  We briefly discussed the possibility of patient being discharged home with 24h care from family/GF, and he is uncertain about this.  He wishes to speak with his wife and [name]; left dad my contact information.  He states he will have his wife contact me for further discharge planning discussions.  ? ? ?Expected Discharge Plan: Skilled Nursing Facility ?Barriers to Discharge: Continued Medical Work up ? ?Expected Discharge Plan and Services ?Expected Discharge Plan: Skilled Nursing Facility ?  ?Discharge Planning Services: CM Consult ?  ?Living arrangements for the past 2 months: Single Family Home ?                ?  ?  ?  ?  ?  ?  ?  ?  ?  ?  ? ? ?Social Determinants of Health (SDOH) Interventions ?  ? ?Readmission Risk Interventions ?   ? View : No data to display.  ?  ?  ?  ? ?Quintella Baton, RN, BSN  ?Trauma/Neuro ICU Case Manager ?[phone] ? ? ?
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
## 2022-04-14 NOTE — Progress Notes (Incomplete)
? ?  Providing Compassionate, Quality Care - Together ? ? ?Subjective: ?Girlfriend is at the bedside. She reports patient has been sleepier than usual. ? ?Objective: ?Vital signs in last 24 hours: ?Temp:  [99.6 ?F (37.6 ?C)-101.3 ?F (38.5 ?C)] 99.7 ?F (37.6 ?C) (04/25 0720) ?Pulse Rate:  [101-135] 135 (04/25 0844) ?Resp:  [22-35] 28 (04/25 0844) ?BP: (129-144)/(82-89) 132/88 (04/25 0720) ?SpO2:  [90 %-96 %] 93 % (04/25 0844) ?FiO2 (%):  [40 %] 40 % (04/25 0844) ? ?Intake/Output from previous day: ?04/24 0701 - 04/25 0700 ?In: 1080 [NG/GT:1080] ?Out: -  ?Intake/Output this shift: ?No intake/output data recorded. ? ?Unarousable ?PERRLA 2 round sluggish ?Not following commands ?Purposeful movement LUE ?No movement BLE, RUE ?Trach in place; on trach collar ? ?Lab Results: ?No results for input(s): WBC, HGB, HCT, PLT in the last 72 hours. ?BMET ?No results for input(s): NA, K, CL, CO2, GLUCOSE, BUN, CREATININE, CALCIUM in the last 72 hours. ? ?Studies/Results: ?CT HEAD WO CONTRAST ( ) ? ?Result Date: 04/14/2022 ?CLINICAL DATA:  Follow-up TBI, decreased mental status EXAM: CT HEAD WITHOUT CONTRAST TECHNIQUE: Contiguous axial images were obtained from the base of the skull through the vertex without intravenous contrast. RADIATION DOSE REDUCTION: This exam was performed according to the departmental dose-optimization program which includes automated exposure control, adjustment of the mA and/or kV according to patient size and/or use of iterative reconstruction technique. COMPARISON:  03/12/2022 FINDINGS: Brain: Previously noted hemorrhage in the basal cisterns is no longer seen. The basal cisterns are patent. No evidence of acute infarction, hemorrhage, cerebral edema, mass, mass effect, or midline shift. The ventricles are within normal limits for size but have increased in size slightly since the prior exams. No extra-axial collection. Vascular: No hyperdense vessel. Skull: Normal. Negative for acute fracture or focal  lesion. Redemonstrated fracture through the right transverse process of C1. Sinuses/Orbits: Mucosal thickening in the right sphenoid sinus and ethmoid air cells. Other: Fluid in bilateral mastoid air cells. IMPRESSION: IMPRESSION 1. Interval resolution of previously noted hemorrhage in the basal cisterns. No acute hemorrhage. 2. Mild increase in the size of the ventricles, concerning for developing hydrocephalus. Attention on follow-up. Electronically Signed   By: Wiliam Ke M.D.   On: 04/14/2022 03:00   ? ?Assessment/Plan: ?*** ? LOS: 35 days  ?{XTGG:[id number]} ? ? ?Val Eagle, DNP, AGNP-C ?Nurse Practitioner ? ?[facility] ?[address], [address], [place], [place] [address] ?P: [phone]    F: [phone] ? ?04/14/2022, 10:21 AM ? ? ? ? ?

## 2022-04-14 NOTE — Progress Notes (Signed)
MD returned page. Verbal order given for 1x dose of Dilaudid 1 mg IV.  ?
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
## 2022-04-14 NOTE — Progress Notes (Signed)
1630- Pt Temp 101.6. This RN administered 1000 mg of Tylenol via tube.  ? ?1730- Temp 100.3 ?
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
## 2022-04-14 NOTE — Progress Notes (Signed)
Per order I/O cath performed with return of 300 ml of clear yellow urine. Urinalysis collected and sent to lab. Pt tolerated well. External cath replaced.  ?

## 2022-04-14 NOTE — Progress Notes (Signed)
RT Note:  in attempt to obtain tracheal aspirate, unable to pass suction cather. Trach dislodged. RT able to reposition and trach easily went into place. ETCO2 positive for color change. BBS noted.  ?Tracheal aspirate sent and lab notified.  ? ? ?

## 2022-04-14 NOTE — Progress Notes (Signed)
Pt HR noted to be maintaining in the 130s, BP 124/79 (91). MD paged awaiting response.  ?

## 2022-04-15 ENCOUNTER — Inpatient Hospital Stay (HOSPITAL_COMMUNITY): Payer: Medicaid Other

## 2022-04-15 DIAGNOSIS — S069X9S Unspecified intracranial injury with loss of consciousness of unspecified duration, sequela: Secondary | ICD-10-CM

## 2022-04-15 DIAGNOSIS — J690 Pneumonitis due to inhalation of food and vomit: Secondary | ICD-10-CM

## 2022-04-15 LAB — GLUCOSE, CAPILLARY
Glucose-Capillary: 129 mg/dL — ABNORMAL HIGH (ref 70–99)
Glucose-Capillary: 130 mg/dL — ABNORMAL HIGH (ref 70–99)
Glucose-Capillary: 135 mg/dL — ABNORMAL HIGH (ref 70–99)
Glucose-Capillary: 135 mg/dL — ABNORMAL HIGH (ref 70–99)
Glucose-Capillary: 135 mg/dL — ABNORMAL HIGH (ref 70–99)
Glucose-Capillary: 146 mg/dL — ABNORMAL HIGH (ref 70–99)
Glucose-Capillary: 147 mg/dL — ABNORMAL HIGH (ref 70–99)

## 2022-04-15 MED ORDER — GUAIFENESIN 100 MG/5ML PO LIQD
15.0000 mL | ORAL | Status: DC
  Administered 2022-04-15 – 2022-05-08 (×136): 15 mL
  Filled 2022-04-15 (×2): qty 20
  Filled 2022-04-15: qty 15
  Filled 2022-04-15: qty 20
  Filled 2022-04-15 (×10): qty 15
  Filled 2022-04-15 (×3): qty 20
  Filled 2022-04-15 (×2): qty 15
  Filled 2022-04-15: qty 20
  Filled 2022-04-15: qty 15
  Filled 2022-04-15: qty 20
  Filled 2022-04-15 (×6): qty 15
  Filled 2022-04-15: qty 20
  Filled 2022-04-15: qty 15
  Filled 2022-04-15: qty 20
  Filled 2022-04-15 (×2): qty 15
  Filled 2022-04-15 (×3): qty 20
  Filled 2022-04-15 (×3): qty 15
  Filled 2022-04-15 (×2): qty 20
  Filled 2022-04-15: qty 15
  Filled 2022-04-15 (×2): qty 20
  Filled 2022-04-15 (×3): qty 15
  Filled 2022-04-15 (×5): qty 20
  Filled 2022-04-15 (×3): qty 15
  Filled 2022-04-15: qty 20
  Filled 2022-04-15: qty 15
  Filled 2022-04-15: qty 20
  Filled 2022-04-15 (×4): qty 15
  Filled 2022-04-15 (×2): qty 20
  Filled 2022-04-15 (×6): qty 15
  Filled 2022-04-15: qty 20
  Filled 2022-04-15 (×3): qty 15
  Filled 2022-04-15: qty 20
  Filled 2022-04-15: qty 15
  Filled 2022-04-15: qty 20
  Filled 2022-04-15 (×3): qty 15
  Filled 2022-04-15: qty 20
  Filled 2022-04-15: qty 15
  Filled 2022-04-15 (×2): qty 20
  Filled 2022-04-15 (×4): qty 15
  Filled 2022-04-15: qty 20
  Filled 2022-04-15 (×11): qty 15
  Filled 2022-04-15: qty 20
  Filled 2022-04-15 (×4): qty 15
  Filled 2022-04-15: qty 20
  Filled 2022-04-15: qty 15
  Filled 2022-04-15: qty 20
  Filled 2022-04-15 (×3): qty 15
  Filled 2022-04-15 (×2): qty 20
  Filled 2022-04-15 (×2): qty 15
  Filled 2022-04-15: qty 20
  Filled 2022-04-15 (×4): qty 15
  Filled 2022-04-15: qty 20
  Filled 2022-04-15 (×2): qty 15
  Filled 2022-04-15: qty 20
  Filled 2022-04-15 (×7): qty 15
  Filled 2022-04-15: qty 20
  Filled 2022-04-15: qty 15
  Filled 2022-04-15: qty 20
  Filled 2022-04-15: qty 15
  Filled 2022-04-15: qty 20
  Filled 2022-04-15 (×2): qty 15
  Filled 2022-04-15: qty 20
  Filled 2022-04-15 (×2): qty 15
  Filled 2022-04-15: qty 20
  Filled 2022-04-15 (×3): qty 15
  Filled 2022-04-15: qty 20
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
## 2022-04-15 NOTE — Progress Notes (Addendum)
? ?Progress Note ? ?28 Days Post-Op  ?Subjective: ?No mental status change from yesterday. Continues to have fevers - 101.67F overnight and tachycardic. Has had increased respiratory secretions and cough since last night requiring increased suctioning ? ?Objective: ?Vital signs in last 24 hours: ?Temp:  [98.5 ?F (36.9 ?C)-101.6 ?F (38.7 ?C)] 100 ?F (37.8 ?C) (04/26 9937) ?Pulse Rate:  [104-130] 110 (04/26 0833) ?Resp:  [23-35] 27 (04/26 1696) ?BP: (119-133)/(77-85) 127/77 (04/26 0736) ?SpO2:  [90 %-97 %] 93 % (04/26 0833) ?FiO2 (%):  [40 %] 40 % (04/26 0833) ?Last BM Date : 04/13/22 ? ?Intake/Output from previous day: ?04/25 0701 - 04/26 0700 ?In: 940 [NG/GT:720; IV Piggyback:200] ?Out: 1550 [Urine:1550] ?Intake/Output this shift: ?No intake/output data recorded. ? ?PE: ?General: WD/WN male, NAD ?ENT: rolled gauze present, no blood noted ?Neck: collar present ?Heart: RRR. Palpable radial and pedal pulses bilaterally ?Lungs: no wheezes. Fine crackles bilateral upper lobes. Janina Mayo present with wet cough and increased secretions from yesterday ?Abd: soft, NT, ND, +BS, PEG present and without leakage around, binder present  ?MS: all 4 extremities are symmetrical with no cyanosis, clubbing, or edema. ?Skin: warm and dry with no masses, lesions, or rashes ?Neuro: not FC, no localization to pain  ? ? ?Lab Results:  ?Recent Labs  ?  04/14/22 ?1159  ?WBC 15.8*  ?HGB 11.8*  ?HCT 36.5*  ?PLT 253  ? ?BMET ?No results for input(s): NA, K, CL, CO2, GLUCOSE, BUN, CREATININE, CALCIUM in the last 72 hours. ?PT/INR ?No results for input(s): LABPROT, INR in the last 72 hours. ?CMP  ?   ?Component Value Date/Time  ? NA 143 04/10/2022 0300  ? K 3.8 04/10/2022 0300  ? CL 106 04/10/2022 0300  ? CO2 29 04/10/2022 0300  ? GLUCOSE 121 (H) 04/10/2022 0300  ? BUN 26 (H) 04/10/2022 0300  ? CREATININE 0.72 04/10/2022 0300  ? CALCIUM 9.7 04/10/2022 0300  ? PROT 7.8 03/10/2022 2117  ? ALBUMIN 4.8 03/10/2022 2117  ? AST 42 (H) 03/10/2022 2117  ? ALT  43 03/10/2022 2117  ? ALKPHOS 67 03/10/2022 2117  ? BILITOT 0.6 03/10/2022 2117  ? GFRNONAA >60 04/10/2022 0300  ? ?Lipase  ?No results found for: LIPASE ? ? ? ? ?Studies/Results: ?CT HEAD WO CONTRAST ( ) ? ?Result Date: 04/14/2022 ?CLINICAL DATA:  Follow-up TBI, decreased mental status EXAM: CT HEAD WITHOUT CONTRAST TECHNIQUE: Contiguous axial images were obtained from the base of the skull through the vertex without intravenous contrast. RADIATION DOSE REDUCTION: This exam was performed according to the departmental dose-optimization program which includes automated exposure control, adjustment of the mA and/or kV according to patient size and/or use of iterative reconstruction technique. COMPARISON:  03/12/2022 FINDINGS: Brain: Previously noted hemorrhage in the basal cisterns is no longer seen. The basal cisterns are patent. No evidence of acute infarction, hemorrhage, cerebral edema, mass, mass effect, or midline shift. The ventricles are within normal limits for size but have increased in size slightly since the prior exams. No extra-axial collection. Vascular: No hyperdense vessel. Skull: Normal. Negative for acute fracture or focal lesion. Redemonstrated fracture through the right transverse process of C1. Sinuses/Orbits: Mucosal thickening in the right sphenoid sinus and ethmoid air cells. Other: Fluid in bilateral mastoid air cells. IMPRESSION: IMPRESSION 1. Interval resolution of previously noted hemorrhage in the basal cisterns. No acute hemorrhage. 2. Mild increase in the size of the ventricles, concerning for developing hydrocephalus. Attention on follow-up. Electronically Signed   By: Wiliam Ke M.D.   On: 04/14/2022  03:00  ? ?DG CHEST PORT 1 VIEW ? ?Result Date: 04/14/2022 ?CLINICAL DATA:  Fever EXAM: PORTABLE CHEST 1 VIEW COMPARISON:  04/02/2022 FINDINGS: Tracheostomy device is present. Elevation of the right hemidiaphragm. Residual or persistent patchy density at the left lung base. No pleural  effusion or pneumothorax. Similar cardiomediastinal contours. IMPRESSION: Residual or persistent atelectasis/consolidation at the left lung base. Electronically Signed   By: Guadlupe Spanish M.D.   On: 04/14/2022 12:18   ? ?Anti-infectives: ?Anti-infectives (From admission, onward)  ? ? Start     Dose/Rate Route Frequency Ordered Stop  ? 04/14/22 2345  ceFEPIme (MAXIPIME) 2 g in sodium chloride 0.9 % 100 mL IVPB       ? 2 g ?200 mL/hr over 30 Minutes Intravenous Every 8 hours 04/14/22 2251    ? 04/14/22 2345  metroNIDAZOLE (FLAGYL) IVPB 500 mg       ? 500 mg ?100 mL/hr over 60 Minutes Intravenous Every 12 hours 04/14/22 2251    ? 04/14/22 2330  piperacillin-tazobactam (ZOSYN) IVPB 3.375 g  Status:  Discontinued       ? 3.375 g ?12.5 mL/hr over 240 Minutes Intravenous Every 8 hours 04/14/22 2242 04/14/22 2248  ? 03/14/22 1015  ceFEPIme (MAXIPIME) 2 g in sodium chloride 0.9 % 100 mL IVPB  Status:  Discontinued       ? 2 g ?200 mL/hr over 30 Minutes Intravenous Every 12 hours 03/14/22 0926 03/14/22 0934  ? 03/14/22 1000  ceFEPIme (MAXIPIME) 2 g in sodium chloride 0.9 % 100 mL IVPB  Status:  Discontinued       ? 2 g ?200 mL/hr over 30 Minutes Intravenous Every 8 hours 03/14/22 0935 03/16/22 1017  ? 03/11/22 0915  clindamycin (CLEOCIN) 75 MG/5ML solution 150 mg       ? 150 mg Per Tube Every 8 hours 03/11/22 0823 03/15/22 2131  ? 03/10/22 2215  ceFAZolin (ANCEF) IVPB 2g/100 mL premix       ? 2 g ?200 mL/hr over 30 Minutes Intravenous  Once 03/10/22 2202 03/10/22 2307  ? ?  ? ? ? ?Assessment/Plan ?[facility] ?  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 Jordan Likes, keppra x7d for sz ppx, TBI therapies once extubated. Repeat CT H 3/23 stable. MRI brain 3/27 with microhemorrhages in cortex, corpaus callosum, basal ganglia, midbrain, pons. Dr. Riley Kill with PM&R added amantadine 3/31, increased to 200mg  BID 4/3. Exam is improving some. Seroquel discontinued 4/20. DC oxycodone and use tylenol or toradol prn for pain if needed. Repeat CT  4/25 due to decrease in level of alertness showing resolution of hemorrhage and no acute hemorrhage. Mild increase in ventricles concerning for hydrocephalus - NSGY reviewed without concern for worsening hydrocephalus and no new reccs ?C1 TP fx, occipital condyle fx - per Dr. 5/25, [facility] J collar in place. ?Acute ventilator dependent respiratory failure s/p trach- cont PSV trials, on guaifenisen for thick secretions, mucomyst, robinul. Changed to #4 cuffless trach 4/17.  ?R ear laceration - S/P repair, completed Clinda x 5d per Dr. 5/17 ?Hyperglycemia - likely from TF, SSI ?Tongue biting - Bite block in room, continue to use as able. Spoke with OMFS 4/14 and only other recs at this point are for MMF with or without an integrated bite block. 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 Continue to monitor, keep silver nitrate applicators at bedside. ENT consulted overnight. Hgb stable at 12.7 last check and gauze still in place with no further bleeding. ?Fever - tmax 101.51F  overnight. CXR and UA 4/25 without infection. Resp culture pending - gram+ cocci, gram - rods, gram+ rods. Started on empiric cefepime/flagyl 4/25. Inc robitussin  ? ?FEN - NPO, TF 80 ml/h per PEG, SLP ?R post tib and peroneal DVT - therapeutic LMWH ?ID - fevers and tachycardia 4/25. CXR and UA neg. Cefepime/flagyl 4/25>, will dc flagyl ?Foley - out. External cath ?  ?Dispo - 4NP, PT/OT/SLP. SNF vs CIR pending progress.  ? ? LOS: 36 days  ? ?I reviewed vitals and meds for last 24 hrs. Reviewed past 24 hour imaging. Reviewed last 24 hour labs including CBC and respiratory culture. ? ? ?Eric FormMartha H Valentino Saavedra, PA-C ?[facility] ?04/15/2022, 8:52 AM ?Please see Amion for pager number during day hours 7:00am-4:30pm ? ?
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
## 2022-04-15 NOTE — Progress Notes (Signed)
RT Note: Called to assess trach due to snoring.  Trach patent, and able to pass cather.  ?

## 2022-04-15 NOTE — Progress Notes (Signed)
Trauma Event Note ? ? ? ?TRN at bedside to round. Decreased level of consciousness and increase in HR and temp over the last 2 days. Janina Mayo found to be dislodged, improvement in HR when replaced. Discussed case with MD, antibiotics ordered. Sputum culture pending.  ? ?Last imported Vital Signs ?BP 119/77 (BP Location: Left Arm)   Pulse (!) 106   Temp 98.5 ?F (36.9 ?C) (Axillary)   Resp (!) 24   Ht 6\' 5"  (1.956 m)   Wt 244 lb 14.9 oz (111.1 kg)   SpO2 94%   BMI 29.04 kg/m?  ? ?Trending CBC ?Recent Labs  ?  04/14/22 ?1159  ?WBC 15.8*  ?HGB 11.8*  ?HCT 36.5*  ?PLT 253  ? ? ?Trending Coag's ?No results for input(s): APTT, INR in the last 72 hours. ? ?Trending BMET ?No results for input(s): NA, K, CL, CO2, BUN, CREATININE, GLUCOSE in the last 72 hours. ? ? ? ?Carl Carpenter  ?Trauma Response RN ? ?Please call TRN at [phone] for further assistance. ? ? ?  ?

## 2022-04-16 LAB — CULTURE, RESPIRATORY W GRAM STAIN

## 2022-04-16 LAB — CBC
HCT: 36.3 % — ABNORMAL LOW (ref 39.0–52.0)
Hemoglobin: 11.4 g/dL — ABNORMAL LOW (ref 13.0–17.0)
MCH: 31.1 pg (ref 26.0–34.0)
MCHC: 31.4 g/dL (ref 30.0–36.0)
MCV: 99.2 fL (ref 80.0–100.0)
Platelets: 201 10*3/uL (ref 150–400)
RBC: 3.66 MIL/uL — ABNORMAL LOW (ref 4.22–5.81)
RDW: 13.8 % (ref 11.5–15.5)
WBC: 11.6 10*3/uL — ABNORMAL HIGH (ref 4.0–10.5)
nRBC: 0 % (ref 0.0–0.2)

## 2022-04-16 LAB — GLUCOSE, CAPILLARY
Glucose-Capillary: 132 mg/dL — ABNORMAL HIGH (ref 70–99)
Glucose-Capillary: 95 mg/dL (ref 70–99)
Glucose-Capillary: 138 mg/dL — ABNORMAL HIGH (ref 70–99)
Glucose-Capillary: 115 mg/dL — ABNORMAL HIGH (ref 70–99)
Glucose-Capillary: 125 mg/dL — ABNORMAL HIGH (ref 70–99)
Glucose-Capillary: 118 mg/dL — ABNORMAL HIGH (ref 70–99)

## 2022-04-16 MED ORDER — SODIUM CHLORIDE 0.9 % IV SOLN
2.0000 g | INTRAVENOUS | Status: DC
  Administered 2022-04-16 – 2022-04-17 (×2): 2 g via INTRAVENOUS
  Filled 2022-04-16 (×2): qty 20

## 2022-04-16 MED ORDER — ENOXAPARIN SODIUM 120 MG/0.8ML IJ SOSY
105.0000 mg | PREFILLED_SYRINGE | Freq: Two times a day (BID) | INTRAMUSCULAR | Status: DC
  Administered 2022-04-16 – 2022-04-17 (×2): 105 mg via SUBCUTANEOUS
  Filled 2022-04-16 (×3): qty 0.7

## 2022-04-16 NOTE — Progress Notes (Signed)
? ?Trauma/Critical Care Follow Up Note ? ?Subjective:  ?  ?Overnight Issues:  ? ?Objective:  ?Vital signs for last 24 hours: ?Temp:  [99.1 ?F (37.3 ?C)-100.4 ?F (38 ?C)] 99.1 ?F (37.3 ?C) (04/27 0735) ?Pulse Rate:  [103-120] 120 (04/27 0806) ?Resp:  [22-29] 29 (04/27 0806) ?BP: (121-140)/(76-86) 126/77 (04/27 0735) ?SpO2:  [92 %-96 %] 92 % (04/27 0806) ?FiO2 (%):  [40 %] 40 % (04/27 0806) ?Weight:  [105 kg] 105 kg (04/27 0500) ? ?Hemodynamic parameters for last 24 hours: ?  ? ?Intake/Output from previous day: ?04/26 0701 - 04/27 0700 ?In: 2056 [NG/GT:2056] ?Out: 2300 [Urine:2100; Stool:200]  ?Intake/Output this shift: ?No intake/output data recorded. ? ?Vent settings for last 24 hours: ?FiO2 (%):  [40 %] 40 % ? ?Physical Exam:  ?Gen: comfortable, no distress ?Neuro: not interactive ?HEENT: PERRL ?Neck: supple ?CV: RRR ?Pulm: unlabored breathing ?Abd: soft, NT ?GU: clear yellow urine ?Extr: wwp, no edema ? ? ?Results for orders placed or performed during the hospital encounter of 03/10/22 (from the past 24 hour(s))  ?Glucose, capillary     Status: Abnormal  ? Collection Time: 04/15/22 11:48 AM  ?Result Value Ref Range  ? Glucose-Capillary 130 (H) 70 - 99 mg/dL  ? Comment 1 Notify RN   ? Comment 2 Document in Chart   ?Glucose, capillary     Status: Abnormal  ? Collection Time: 04/15/22  2:59 PM  ?Result Value Ref Range  ? Glucose-Capillary 129 (H) 70 - 99 mg/dL  ?Glucose, capillary     Status: Abnormal  ? Collection Time: 04/15/22  7:53 PM  ?Result Value Ref Range  ? Glucose-Capillary 135 (H) 70 - 99 mg/dL  ?Glucose, capillary     Status: Abnormal  ? Collection Time: 04/15/22 11:53 PM  ?Result Value Ref Range  ? Glucose-Capillary 146 (H) 70 - 99 mg/dL  ?Glucose, capillary     Status: Abnormal  ? Collection Time: 04/16/22  3:58 AM  ?Result Value Ref Range  ? Glucose-Capillary 132 (H) 70 - 99 mg/dL  ?CBC     Status: Abnormal  ? Collection Time: 04/16/22  4:57 AM  ?Result Value Ref Range  ? WBC 11.6 (H) 4.0 - 10.5 K/uL   ? RBC 3.66 (L) 4.22 - 5.81 MIL/uL  ? Hemoglobin 11.4 (L) 13.0 - 17.0 g/dL  ? HCT 36.3 (L) 39.0 - 52.0 %  ? MCV 99.2 80.0 - 100.0 fL  ? MCH 31.1 26.0 - 34.0 pg  ? MCHC 31.4 30.0 - 36.0 g/dL  ? RDW 13.8 11.5 - 15.5 %  ? Platelets 201 150 - 400 K/uL  ? nRBC 0.0 0.0 - 0.2 %  ?Glucose, capillary     Status: None  ? Collection Time: 04/16/22  8:03 AM  ?Result Value Ref Range  ? Glucose-Capillary 95 70 - 99 mg/dL  ? ? ?Assessment & Plan: ? ?Present on Admission: ? TBI (traumatic brain injury) (HCC) ? ? ? LOS: 37 days  ? ?Additional comments:I reviewed the patient's new clinical lab test results.   and I reviewed the patients new imaging test results.   ? ?MCC ?  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 Jordan Likes, keppra x7d for sz ppx, TBI therapies once extubated. Repeat CT H 3/23 stable. MRI brain 3/27 with microhemorrhages in cortex, corpaus callosum, basal ganglia, midbrain, pons. Dr. Riley Kill with PM&R added amantadine 3/31, increased to 200mg  BID 4/3. Exam is improving some. Seroquel discontinued 4/20. DC oxycodone and use tylenol or toradol prn for pain if  needed. Repeat CT 4/25 due to decrease in level of alertness showing resolution of hemorrhage and no acute hemorrhage. Mild increase in ventricles concerning for hydrocephalus - NSGY reviewed without concern for worsening hydrocephalus and no new reccs ?C1 TP fx, occipital condyle fx - per Dr. Jordan Likes, [facility] J collar in place. ?Acute ventilator dependent respiratory failure s/p trach- cont PSV trials, on guaifenisen for thick secretions, mucomyst, robinul. Changed to #4 cuffless trach 4/17.  ?R ear laceration - S/P repair, completed Clinda x 5d per Dr. Elijah Birk ?Hyperglycemia - likely from TF, SSI ?Tongue biting - Bite block in room, continue to use as able. Spoke with OMFS 4/14 and only other recs at this point are for MMF with or without an integrated bite block. 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 Continue to monitor, keep silver nitrate  applicators at bedside. ENT consulted overnight. Hgb stable at 12.7 last check and gauze still in place with no further bleeding. ?Fever - tmax 101.7F overnight. CXR and UA 4/25 without infection. Resp culture pending - Enterobacter, de-escalate to CTX, total 7d course.   ?  ?FEN - NPO, TF 80 ml/h per PEG, SLP ?R post tib and peroneal DVT - therapeutic LMWH ?ID - fevers and tachycardia 4/25. CXR and UA neg. Resp cx as above ?Foley - out. External cath ?  ?Dispo - 4NP, PT/OT/SLP. SNF vs CIR pending progress.  ? ?Diamantina Monks, MD ?Trauma & General Surgery ?Please use [email or web address] to contact on call provider ? ?04/16/2022 ? ?*Care during the described time interval was provided by me. I have reviewed this patient's available data, including medical history, events of note, physical examination and test results as part of my evaluation. ? ? ? ?
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
## 2022-04-16 NOTE — TOC Progression Note (Signed)
Transition of Care (TOC) - Progression Note  ? ? ?Patient Details  ?Name: Carl Carpenter ?MRN: [id number] ?Date of Birth: [birth year] ? ?Transition of Care (TOC) CM/SW Contact  ?Verdell Carmine, RN ?Phone Number: ?04/16/2022, 1:05 PM ? ?Clinical Narrative:    ?Patient continues without change. Has a trach and PEG.  Plan is to continue treatments and DC to SNF or CIR once stable. Family is aware of long road to recovery. Medicaid is pending.  Family stated they have a card for medicaid. CM spoke to them on 4/25 regarding SNF versus home with 24 hour care. The father stated they would have to speak and get back with CM. Sent message to [facility] to check on status of Medicaid. ? ? ?Expected Discharge Plan: [place] ?Barriers to Discharge: Continued Medical Work up ? ?Expected Discharge Plan and Services ?Expected Discharge Plan: [place] ?  ?Discharge Planning Services: CM Consult ?  ?Living arrangements for the past 2 months: [place] ?                ?  ?  ?  ?  ?  ?  ?  ?  ?  ?  ? ? ?Social Determinants of Health (SDOH) Interventions ?  ? ?Readmission Risk Interventions ?   ? View : No data to display.  ?  ?  ?  ? ? ?
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
## 2022-04-17 LAB — GLUCOSE, CAPILLARY
Glucose-Capillary: 122 mg/dL — ABNORMAL HIGH (ref 70–99)
Glucose-Capillary: 122 mg/dL — ABNORMAL HIGH (ref 70–99)
Glucose-Capillary: 128 mg/dL — ABNORMAL HIGH (ref 70–99)
Glucose-Capillary: 141 mg/dL — ABNORMAL HIGH (ref 70–99)
Glucose-Capillary: 141 mg/dL — ABNORMAL HIGH (ref 70–99)
Glucose-Capillary: 146 mg/dL — ABNORMAL HIGH (ref 70–99)

## 2022-04-17 MED ORDER — APIXABAN 5 MG PO TABS
5.0000 mg | ORAL_TABLET | Freq: Two times a day (BID) | ORAL | Status: DC
  Administered 2022-04-17 – 2022-05-08 (×42): 5 mg
  Filled 2022-04-17 (×42): qty 1

## 2022-04-17 NOTE — Progress Notes (Signed)
? ?Trauma/Critical Care Follow Up Note ? ?Subjective:  ?  ?Overnight Issues:  ? ?Objective:  ?Vital signs for last 24 hours: ?Temp:  [100 ?F (37.8 ?C)-100.7 ?F (38.2 ?C)] 100 ?F (37.8 ?C) (04/28 0751) ?Pulse Rate:  [104-122] 104 (04/28 0751) ?Resp:  [22-30] 25 (04/28 0751) ?BP: (119-138)/(77-92) 134/86 (04/28 0751) ?SpO2:  [92 %-98 %] 94 % (04/28 0751) ?FiO2 (%):  [40 %] 40 % (04/27 2048) ? ?Hemodynamic parameters for last 24 hours: ?  ? ?Intake/Output from previous day: ?04/27 0701 - 04/28 0700 ?In: 1010 [NG/GT:1010] ?Out: 1200 [Urine:1000; Stool:200]  ?Intake/Output this shift: ?No intake/output data recorded. ? ?Vent settings for last 24 hours: ?FiO2 (%):  [40 %] 40 % ? ?Physical Exam:  ?Gen: comfortable, no distress ?Neuro: not interactive ?HEENT: PERRL ?Neck: supple ?CV: RRR ?Pulm: unlabored breathing ?Abd: soft, NT ?GU: clear yellow urine ?Extr: wwp, no edema ? ? ?Results for orders placed or performed during the hospital encounter of 03/10/22 (from the past 24 hour(s))  ?Glucose, capillary     Status: Abnormal  ? Collection Time: 04/16/22 11:11 AM  ?Result Value Ref Range  ? Glucose-Capillary 138 (H) 70 - 99 mg/dL  ?Glucose, capillary     Status: Abnormal  ? Collection Time: 04/16/22  3:36 PM  ?Result Value Ref Range  ? Glucose-Capillary 115 (H) 70 - 99 mg/dL  ?Glucose, capillary     Status: Abnormal  ? Collection Time: 04/16/22  7:44 PM  ?Result Value Ref Range  ? Glucose-Capillary 125 (H) 70 - 99 mg/dL  ?Glucose, capillary     Status: Abnormal  ? Collection Time: 04/16/22 11:24 PM  ?Result Value Ref Range  ? Glucose-Capillary 118 (H) 70 - 99 mg/dL  ?Glucose, capillary     Status: Abnormal  ? Collection Time: 04/17/22  3:27 AM  ?Result Value Ref Range  ? Glucose-Capillary 122 (H) 70 - 99 mg/dL  ?Glucose, capillary     Status: Abnormal  ? Collection Time: 04/17/22  7:49 AM  ?Result Value Ref Range  ? Glucose-Capillary 128 (H) 70 - 99 mg/dL  ? ? ?Assessment & Plan: ? ?Present on Admission: ? TBI (traumatic  brain injury) (HCC) ? ? ? LOS: 38 days  ? ?Additional comments:I reviewed the patient's new clinical lab test results.   and I reviewed the patients new imaging test results.   ? ?[facility] ?  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 Jordan Likes, keppra x7d for sz ppx, TBI therapies once extubated. Repeat CT H 3/23 stable. MRI brain 3/27 with microhemorrhages in cortex, corpaus callosum, basal ganglia, midbrain, pons. Dr. Riley Kill with PM&R added amantadine 3/31, increased to 200mg  BID 4/3. Exam is improving some. Seroquel discontinued 4/20. DC oxycodone and use tylenol or toradol prn for pain if needed. Repeat CT 4/25 due to decrease in level of alertness showing resolution of hemorrhage and no acute hemorrhage. Mild increase in ventricles concerning for hydrocephalus - NSGY reviewed without concern for worsening hydrocephalus and no new reccs ?C1 TP fx, occipital condyle fx - per Dr. 5/25, [facility] J collar in place. ?Acute ventilator dependent respiratory failure s/p trach- cont PSV trials, on guaifenisen for thick secretions, mucomyst, robinul. Changed to #4 cuffless trach 4/17. Add chest physiotherapy ?R ear laceration - S/P repair, completed Clinda x 5d per Dr. 5/17 ?Hyperglycemia - likely from TF, SSI ?Tongue biting - Bite block in room, continue to use as able. Spoke with OMFS 4/14 and only other recs at this point are for MMF with or without  an integrated bite block. 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 Continue to monitor, keep silver nitrate applicators at bedside. ENT consulted overnight. Hgb stable at 12.7 last check and gauze still in place with no further bleeding. ?Fever - tmax 100.79F overnight. CXR and UA 4/25 without infection. Resp culture - Enterobacter, de-escalate to CTX 4/27, total 7d course. ?  ?FEN - NPO, TF 80 ml/h per PEG, SLP ?R post tib and peroneal DVT - therapeutic LMWH ?ID - fevers and tachycardia 4/25. CXR and UA neg. Ceftriaxone for respiratory cultures as  above ?Foley - out. External cath ?  ?Dispo - 4NP, PT/OT/SLP. SNF vs CIR pending progress.  ? ? ?Quentin Ore, MD ? ? ?Please use [email or web address] to contact on call provider ? ?04/17/2022 ? ?*Care during the described time interval was provided by me. I have reviewed this patient's available data, including medical history, events of note, physical examination and test results as part of my evaluation. ? ? ? ?

## 2022-04-17 NOTE — Discharge Instructions (Signed)
Information on my medicine - ELIQUIS? (apixaban) ? ?This medication education was reviewed with me or my healthcare representative as part of my discharge preparation.    ? ?Why was Eliquis? prescribed for you? ?Eliquis? was prescribed to treat blood clots that may have been found in the veins of your legs (deep vein thrombosis) or in your lungs (pulmonary embolism) and to reduce the risk of them occurring again. ? ?What do You need to know about Eliquis? ? ?The the dose is ONE 5 mg tablet taken TWICE daily.  Eliquis? may be taken with or without food.  ? ?Try to take the dose about the same time in the morning and in the evening. If you have difficulty swallowing the tablet whole please discuss with your pharmacist how to take the medication safely. ? ?Take Eliquis? exactly as prescribed and DO NOT stop taking Eliquis? without talking to the doctor who prescribed the medication.  Stopping may increase your risk of developing a new blood clot.  Refill your prescription before you run out. ? ?After discharge, you should have regular check-up appointments with your healthcare provider that is prescribing your Eliquis?. ?   ?What do you do if you miss a dose? ?If a dose of ELIQUIS? is not taken at the scheduled time, take it as soon as possible on the same day and twice-daily administration should be resumed. The dose should not be doubled to make up for a missed dose. ? ?Important Safety Information ?A possible side effect of Eliquis? is bleeding. You should call your healthcare provider right away if you experience any of the following: ?Bleeding from an injury or your nose that does not stop. ?Unusual colored urine (red or dark brown) or unusual colored stools (red or black). ?Unusual bruising for unknown reasons. ?A serious fall or if you hit your head (even if there is no bleeding). ? ?Some medicines may interact with Eliquis? and might increase your risk of bleeding or clotting while on Eliquis?Marland Kitchen To help avoid  this, consult your healthcare provider or pharmacist prior to using any new prescription or non-prescription medications, including herbals, vitamins, non-steroidal anti-inflammatory drugs (NSAIDs) and supplements. ? ?This website has more information on Eliquis? (apixaban): http://www.eliquis.com/eliquis/home ? ?================================ ? ?Deep Vein Thrombosis ? ?  ?Deep vein thrombosis (DVT) is a condition in which a blood clot forms in a deep vein, such as a lower leg, thigh, or arm vein. A clot is blood that has thickened into a gel or solid. This condition is dangerous. It can lead to serious and even life-threatening complications if the clot travels to the lungs and causes a blockage (pulmonary embolism). It can also damage veins in the leg. This can result in leg pain, swelling, discoloration, and sores (post-thrombotic syndrome). ? ?What are the causes? ?This condition may be caused by: ?A slowdown of blood flow. ?Damage to a vein. ?A condition that causes blood to clot more easily, such as an inherited clotting disorder. ? ?What increases the risk? ?The following factors may make you more likely to develop this condition: ?Being overweight. ?Being older, especially over age 41. ?Sitting or lying down for more than four hours. ?Being in the hospital. ?Lack of physical activity (sedentary lifestyle). ?Pregnancy, being in childbirth, or having recently given birth. ?Taking medicines that contain estrogen, such as medicines to prevent pregnancy. ?Smoking. ?A history of any of the following: ?Blood clots or a blood clotting disease. ?Peripheral vascular disease. ?Inflammatory bowel disease. ?Cancer. ?Heart disease. ?Genetic conditions that affect  how your blood clots, such as Factor V Leiden mutation. ?Neurological diseases that affect your legs (leg paresis). ?A recent injury, such as a car accident. ?Major or lengthy surgery. ?A central line placed inside a large vein. ? ?What are the signs or  symptoms? ?Symptoms of this condition include: ?Swelling, pain, or tenderness in an arm or leg. ?Warmth, redness, or discoloration in an arm or leg. ?If the clot is in your leg, symptoms may be more noticeable or worse when you stand or walk. Some people may not develop any symptoms. ? ?How is this diagnosed? ?This condition is diagnosed with: ?A medical history and physical exam. ?Tests, such as: ?Blood tests. These are done to check how well your blood clots. ?Ultrasound. This is done to check for clots. ?Venogram. For this test, contrast dye is injected into a vein and X-rays are taken to check for any clots ? ?How is this treated? ?Treatment for this condition depends on: ?The cause of your DVT. ?Your risk for bleeding or developing more clots. ?Any other medical conditions that you have. ?Treatment may include: ?Taking a blood thinner (anticoagulant). This type of medicine prevents clots from forming. It may be taken by mouth, injected under the skin, or injected through an IV (catheter). ?Injecting clot-dissolving medicines into the affected vein (catheter-directed thrombolysis). ?Having surgery. Surgery may be done to: ?Remove the clot. ?Place a filter in a large vein to catch blood clots before they reach the lungs. ?Some treatments may be continued for up to six months. ? ?Follow these instructions at home: ?If you are taking blood thinners: ?Take the medicine exactly as told by your health care provider. Some blood thinners need to be taken at the same time every day. Do not skip a dose. ?Talk with your health care provider before you take any medicines that contain aspirin or NSAIDs. These medicines increase your risk for dangerous bleeding. ?Ask your health care provider about foods and drugs that could change the way the medicine works (may interact). Avoid those things if your health care provider tells you to do so. ?Blood thinners can cause easy bruising and may make it difficult to stop bleeding.  Because of this: ?Be very careful when using knives, scissors, or other sharp objects. ?Use an electric razor instead of a blade. ?Avoid activities that could cause injury or bruising, and follow instructions about how to prevent falls. ?Wear a medical alert bracelet or carry a card that lists what medicines you take. ? ?General instructions ?Take over-the-counter and prescription medicines only as told by your health care provider. ?Return to your normal activities as told by your health care provider. Ask your health care provider what activities are safe for you. ?Wear compression stockings if recommended by your health care provider. ?Keep all follow-up visits as told by your health care provider. This is important. ? ?How is this prevented? ?To lower your risk of developing this condition again: ?For 30 or more minutes every day, do an activity that: ?Involves moving your arms and legs. ?Increases your heart rate. ?When traveling for longer than four hours: ?Exercise your arms and legs every hour. ?Drink plenty of water. ?Avoid drinking alcohol. ?Avoid sitting or lying for a long time without moving your legs. ?If you have surgery or you are hospitalized, ask about ways to prevent blood clots. These may include taking frequent walks or using anticoagulants. ?Stay at a healthy weight. ?If you are a woman who is older than age 2, avoid unnecessary  use of medicines that contain estrogen, such as some birth control pills. ?Do not use any products that contain nicotine or tobacco, such as cigarettes and e-cigarettes. This is especially important if you take estrogen medicines. If you need help quitting, ask your health care provider. ? ?Contact a health care provider if: ?You miss a dose of your blood thinner. ?Your menstrual period is heavier than usual. ?You have unusual bruising. ? ?Get help right away if: ?You have: ?New or increased pain, swelling, or redness in an arm or leg. ?Numbness or tingling in an arm  or leg. ?Shortness of breath. ?Chest pain. ?A rapid or irregular heartbeat. ?A severe headache or confusion. ?A cut that will not stop bleeding. ?There is blood in your vomit, stool, or urine. ?You have a serious

## 2022-04-18 LAB — GLUCOSE, CAPILLARY
Glucose-Capillary: 119 mg/dL — ABNORMAL HIGH (ref 70–99)
Glucose-Capillary: 119 mg/dL — ABNORMAL HIGH (ref 70–99)
Glucose-Capillary: 120 mg/dL — ABNORMAL HIGH (ref 70–99)
Glucose-Capillary: 134 mg/dL — ABNORMAL HIGH (ref 70–99)
Glucose-Capillary: 135 mg/dL — ABNORMAL HIGH (ref 70–99)
Glucose-Capillary: 141 mg/dL — ABNORMAL HIGH (ref 70–99)

## 2022-04-18 MED ORDER — SODIUM CHLORIDE 0.9 % IV SOLN
2.0000 g | Freq: Three times a day (TID) | INTRAVENOUS | Status: AC
  Administered 2022-04-18 – 2022-04-20 (×8): 2 g via INTRAVENOUS
  Filled 2022-04-18 (×10): qty 12.5

## 2022-04-18 NOTE — Progress Notes (Signed)
? ?Trauma/Critical Care Follow Up Note ? ?Subjective:  ?  ?Overnight Issues:  ? ?Objective:  ?Vital signs for last 24 hours: ?Temp:  [99 ?F (37.2 ?C)-101.1 ?F (38.4 ?C)] 100 ?F (37.8 ?C) (04/29 0750) ?Pulse Rate:  [100-118] 102 (04/29 0900) ?Resp:  [20-28] 24 (04/29 0900) ?BP: (128-138)/(81-90) 133/86 (04/29 0900) ?SpO2:  [92 %-97 %] 97 % (04/29 0900) ?FiO2 (%):  [40 %] 40 % (04/29 0900) ? ?Hemodynamic parameters for last 24 hours: ?  ? ?Intake/Output from previous day: ?04/28 0701 - 04/29 0700 ?In: -  ?Out: 1500 [Urine:1300; Stool:200]  ?Intake/Output this shift: ?Total I/O ?In: -  ?Out: 700 [Urine:700] ? ?Vent settings for last 24 hours: ?FiO2 (%):  [40 %] 40 % ? ?Physical Exam:  ?Gen: comfortable, no distress ?Neuro: not interactive ?HEENT: PERRL ?Neck: supple ?CV: RRR ?Pulm: unlabored breathing ?Abd: soft, NT ?GU: clear yellow urine ?Extr: wwp, no edema ? ? ?Results for orders placed or performed during the hospital encounter of 03/10/22 (from the past 24 hour(s))  ?Glucose, capillary     Status: Abnormal  ? Collection Time: 04/17/22 11:30 AM  ?Result Value Ref Range  ? Glucose-Capillary 146 (H) 70 - 99 mg/dL  ?Glucose, capillary     Status: Abnormal  ? Collection Time: 04/17/22  3:15 PM  ?Result Value Ref Range  ? Glucose-Capillary 141 (H) 70 - 99 mg/dL  ?Glucose, capillary     Status: Abnormal  ? Collection Time: 04/17/22  8:18 PM  ?Result Value Ref Range  ? Glucose-Capillary 122 (H) 70 - 99 mg/dL  ?Glucose, capillary     Status: Abnormal  ? Collection Time: 04/17/22 11:32 PM  ?Result Value Ref Range  ? Glucose-Capillary 141 (H) 70 - 99 mg/dL  ?Glucose, capillary     Status: Abnormal  ? Collection Time: 04/18/22  3:15 AM  ?Result Value Ref Range  ? Glucose-Capillary 141 (H) 70 - 99 mg/dL  ?Glucose, capillary     Status: Abnormal  ? Collection Time: 04/18/22  7:49 AM  ?Result Value Ref Range  ? Glucose-Capillary 119 (H) 70 - 99 mg/dL  ? ? ?Assessment & Plan: ? ?Present on Admission: ? TBI (traumatic brain  injury) (HCC) ? ? ? LOS: 39 days  ? ?Additional comments:I reviewed the patient's new clinical lab test results.   and I reviewed the patients new imaging test results.   ? ?[facility] ?  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 Jordan Likes, keppra x7d for sz ppx, TBI therapies once extubated. Repeat CT H 3/23 stable. MRI brain 3/27 with microhemorrhages in cortex, corpaus callosum, basal ganglia, midbrain, pons. Dr. Riley Kill with PM&R added amantadine 3/31, increased to 200mg  BID 4/3. Exam is improving some. Seroquel discontinued 4/20. DC oxycodone and use tylenol or toradol prn for pain if needed. Repeat CT 4/25 due to decrease in level of alertness showing resolution of hemorrhage and no acute hemorrhage. Mild increase in ventricles concerning for hydrocephalus - NSGY reviewed without concern for worsening hydrocephalus and no new reccs ?C1 TP fx, occipital condyle fx - per Dr. 5/25, [facility] J collar in place. ?Acute ventilator dependent respiratory failure s/p trach- cont PSV trials, on guaifenisen for thick secretions, mucomyst, robinul. Changed to #4 cuffless trach 4/17. Add chest physiotherapy ?R ear laceration - S/P repair, completed Clinda x 5d per Dr. 5/17 ?Hyperglycemia - likely from TF, SSI ?Tongue biting - Bite block in room, continue to use as able. Discussed with OMFS 4/14 and only other recs at this point ?Fever -  tmax 101.1 F overnight. CXR and UA 4/25 without infection. Resp culture - Enterobacter, cefepime per pharmacy recommendations ?  ?FEN - NPO, TF 80 ml/h per PEG, SLP ?R post tib and peroneal DVT - therapeutic LMWH ?ID - fevers and tachycardia 4/25. CXR and UA neg. Cefepime for respiratory cultures as above ?Foley - out. External cath ?  ?Dispo - 4NP, PT/OT/SLP. SNF vs CIR pending progress.  ? ? ?Quentin Ore, MD ? ? ?Please use [email or web address] to contact on call provider ? ?04/18/2022 ? ?*Care during the described time interval was provided by me. I have reviewed this patient's available data,  including medical history, events of note, physical examination and test results as part of my evaluation. ? ? ? ?

## 2022-04-19 LAB — GLUCOSE, CAPILLARY
Glucose-Capillary: 104 mg/dL — ABNORMAL HIGH (ref 70–99)
Glucose-Capillary: 109 mg/dL — ABNORMAL HIGH (ref 70–99)
Glucose-Capillary: 125 mg/dL — ABNORMAL HIGH (ref 70–99)
Glucose-Capillary: 126 mg/dL — ABNORMAL HIGH (ref 70–99)
Glucose-Capillary: 133 mg/dL — ABNORMAL HIGH (ref 70–99)
Glucose-Capillary: 141 mg/dL — ABNORMAL HIGH (ref 70–99)

## 2022-04-19 NOTE — Progress Notes (Signed)
? ?Trauma/Critical Care Follow Up Note ? ?Subjective:  ?  ?Overnight Issues:  ? ?Objective:  ?Vital signs for last 24 hours: ?Temp:  [98.2 ?F (36.8 ?C)-100.2 ?F (37.9 ?C)] 98.7 ?F (37.1 ?C) (04/30 1124) ?Pulse Rate:  [88-107] 93 (04/30 1124) ?Resp:  [20-30] 23 (04/30 1124) ?BP: (111-130)/(73-86) 120/73 (04/30 1124) ?SpO2:  [93 %-98 %] 96 % (04/30 1124) ?FiO2 (%):  [40 %] 40 % (04/30 0840) ? ?Hemodynamic parameters for last 24 hours: ?  ? ?Intake/Output from previous day: ?04/29 0701 - 04/30 0700 ?In: 4000 [NG/GT:3800; IV Piggyback:100] ?Out: 1950 [Urine:1950]  ?Intake/Output this shift: ?No intake/output data recorded. ? ?Vent settings for last 24 hours: ?FiO2 (%):  [40 %] 40 % ? ?Physical Exam:  ?Gen: comfortable, no distress ?Neuro: not interactive ?HEENT: PERRL ?Neck: supple ?CV: RRR ?Pulm: unlabored breathing ?Abd: soft, NT ?GU: clear yellow urine ?Extr: wwp, no edema ? ? ?Results for orders placed or performed during the hospital encounter of 03/10/22 (from the past 24 hour(s))  ?Glucose, capillary     Status: Abnormal  ? Collection Time: 04/18/22 12:12 PM  ?Result Value Ref Range  ? Glucose-Capillary 134 (H) 70 - 99 mg/dL  ?Glucose, capillary     Status: Abnormal  ? Collection Time: 04/18/22  3:16 PM  ?Result Value Ref Range  ? Glucose-Capillary 119 (H) 70 - 99 mg/dL  ?Glucose, capillary     Status: Abnormal  ? Collection Time: 04/18/22  7:51 PM  ?Result Value Ref Range  ? Glucose-Capillary 120 (H) 70 - 99 mg/dL  ?Glucose, capillary     Status: Abnormal  ? Collection Time: 04/18/22 11:33 PM  ?Result Value Ref Range  ? Glucose-Capillary 135 (H) 70 - 99 mg/dL  ?Glucose, capillary     Status: Abnormal  ? Collection Time: 04/19/22  3:37 AM  ?Result Value Ref Range  ? Glucose-Capillary 104 (H) 70 - 99 mg/dL  ?Glucose, capillary     Status: Abnormal  ? Collection Time: 04/19/22  7:18 AM  ?Result Value Ref Range  ? Glucose-Capillary 133 (H) 70 - 99 mg/dL  ? Comment 1 Notify RN   ? Comment 2 Document in Chart    ?Glucose, capillary     Status: Abnormal  ? Collection Time: 04/19/22 11:23 AM  ?Result Value Ref Range  ? Glucose-Capillary 126 (H) 70 - 99 mg/dL  ? ? ?Assessment & Plan: ? ?Present on Admission: ? TBI (traumatic brain injury) ([place]) ? ? ? LOS: 40 days  ? ?Additional comments:I reviewed the patient's new clinical lab test results.   and I reviewed the patients new imaging test results.   ? ?[facility] ?  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 Annette Stable, keppra x7d for sz ppx, TBI therapies once extubated. Repeat CT H 3/23 stable. MRI brain 3/27 with microhemorrhages in cortex, corpaus callosum, basal ganglia, midbrain, pons. Dr. Naaman Plummer with PM&R added amantadine 3/31, increased to 200mg  BID 4/3. Exam is improving some. Seroquel discontinued 4/20. DC oxycodone and use tylenol or toradol prn for pain if needed. Repeat CT 4/25 due to decrease in level of alertness showing resolution of hemorrhage and no acute hemorrhage. Mild increase in ventricles concerning for hydrocephalus - NSGY reviewed without concern for worsening hydrocephalus and no new reccs ?C1 TP fx, occipital condyle fx - per Dr. Annette Stable, [facility] J collar in place. ?Acute ventilator dependent respiratory failure s/p trach- cont PSV trials, on guaifenisen for thick secretions, mucomyst, robinul. Changed to #4 cuffless trach 4/17. Add chest physiotherapy ?R ear  laceration - S/P repair, completed Clinda x 5d per Dr. Marcelline Deist ?Hyperglycemia - likely from TF, SSI ?Tongue biting - Bite block in room, continue to use as able. Discussed with OMFS 4/14 and only other recs at this point ?Fever - fever curve improved. Resp culture - Enterobacter, cefepime per pharmacy recommendations ?  ?FEN - NPO, TF 80 ml/h per PEG, SLP ?R post tib and peroneal DVT - therapeutic LMWH ?ID - fevers and tachycardia 4/25. CXR and UA neg. Cefepime for respiratory cultures as above ?Foley - out. External cath ?  ?Dispo - 4NP, PT/OT/SLP. SNF vs CIR pending progress.  ? ? ?Felicie Morn,  MD ? ? ?Please use [email or web address] to contact on call provider ? ?04/19/2022 ? ?*Care during the described time interval was provided by me. I have reviewed this patient's available data, including medical history, events of note, physical examination and test results as part of my evaluation. ? ? ? ?

## 2022-04-20 LAB — GLUCOSE, CAPILLARY
Glucose-Capillary: 113 mg/dL — ABNORMAL HIGH (ref 70–99)
Glucose-Capillary: 129 mg/dL — ABNORMAL HIGH (ref 70–99)
Glucose-Capillary: 124 mg/dL — ABNORMAL HIGH (ref 70–99)
Glucose-Capillary: 120 mg/dL — ABNORMAL HIGH (ref 70–99)
Glucose-Capillary: 138 mg/dL — ABNORMAL HIGH (ref 70–99)

## 2022-04-20 NOTE — TOC Progression Note (Addendum)
Transition of Care (TOC) - Progression Note  ? ? ?Patient Details  ?Name: Carl Carpenter ?MRN: [id number] ?Date of Birth: 1986/01/17 ? ?Transition of Care (TOC) CM/SW Contact  ?Glennon Mac, RN ?Phone Number: ?04/20/2022, 3:50 PM ? ?Clinical Narrative:    ?Patient has received Medicaid, and family has presented Medicaid card.   ? ?Recipient ID: [id number] R ?Issue Date: 04/09/2022 ? ?Email sent to Patient Accounting, requesting update of payor source.  ? ?Addendum: 04/21/2022 1004 ?Informed by patient accounting that Medicaid app for disability decision is still pending.  The number received is for family planning only.  Will await disability Medicaid decision, and inform family.   ? ?Expected Discharge Plan: Skilled Nursing Facility ?Barriers to Discharge: Continued Medical Work up ? ?Expected Discharge Plan and Services ?Expected Discharge Plan: Skilled Nursing Facility ?  ?Discharge Planning Services: CM Consult ?  ?Living arrangements for the past 2 months: Single Family Home ?                ?  ?  ?  ?  ?  ?  ?  ?  ?  ?  ? ? ?Social Determinants of Health (SDOH) Interventions ?  ? ?Readmission Risk Interventions ?   ? View : No data to display.  ?  ?  ?  ? ?Quintella Baton, RN, BSN  ?Trauma/Neuro ICU Case Manager ?[phone] ? ?
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
## 2022-04-20 NOTE — Progress Notes (Addendum)
? ?Trauma/Critical Care Follow Up Note ? ?Subjective:  ?  ?Overnight Issues:  ?Father is bedside and says patient was awake with eyes opened and following some commands earlier this am. No eye opening at time of my exam. Does grasp with left hand ?Objective:  ?Vital signs for last 24 hours: ?Temp:  [98.2 ?F (36.8 ?C)-99.8 ?F (37.7 ?C)] 99.1 ?F (37.3 ?C) (05/01 0346) ?Pulse Rate:  [85-102] 93 (05/01 0346) ?Resp:  [17-26] 17 (05/01 0346) ?BP: (109-131)/(73-86) 124/78 (05/01 0346) ?SpO2:  [93 %-96 %] 93 % (05/01 0346) ?FiO2 (%):  [40 %] 40 % (05/01 0323) ? ?Hemodynamic parameters for last 24 hours: ?  ? ?Intake/Output from previous day: ?04/30 0701 - 05/01 0700 ?In: 2260 [NG/GT:1700; IV Piggyback:300] ?Out: 1550 [Urine:1550]  ?Intake/Output this shift: ?No intake/output data recorded. ? ?Vent settings for last 24 hours: ?FiO2 (%):  [40 %] 40 % ? ?Physical Exam:  ?Gen: comfortable, no distress ?Neuro: not interactive this am ?HEENT: PERRL ?Neck: supple ?CV: RRR ?Pulm: unlabored breathing with TC ?Abd: soft, NT ?GU: clear yellow urine in cannister ?Extr: wwp, no edema ? ? ?Results for orders placed or performed during the hospital encounter of 03/10/22 (from the past 24 hour(s))  ?Glucose, capillary     Status: Abnormal  ? Collection Time: 04/19/22 11:23 AM  ?Result Value Ref Range  ? Glucose-Capillary 126 (H) 70 - 99 mg/dL  ?Glucose, capillary     Status: Abnormal  ? Collection Time: 04/19/22  4:29 PM  ?Result Value Ref Range  ? Glucose-Capillary 125 (H) 70 - 99 mg/dL  ?Glucose, capillary     Status: Abnormal  ? Collection Time: 04/19/22  7:49 PM  ?Result Value Ref Range  ? Glucose-Capillary 141 (H) 70 - 99 mg/dL  ?Glucose, capillary     Status: Abnormal  ? Collection Time: 04/19/22 11:37 PM  ?Result Value Ref Range  ? Glucose-Capillary 109 (H) 70 - 99 mg/dL  ?Glucose, capillary     Status: Abnormal  ? Collection Time: 04/20/22  3:49 AM  ?Result Value Ref Range  ? Glucose-Capillary 113 (H) 70 - 99 mg/dL   ? ? ?Assessment & Plan: ? ?Present on Admission: ? TBI (traumatic brain injury) (HCC) ? ? ? LOS: 41 days  ? ?Additional comments:I reviewed the patient's new clinical lab test results.   and I reviewed the patients new imaging test results.   ? ?[facility] ?  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 Jordan Likes, keppra x7d for sz ppx, TBI therapies once extubated. Repeat CT H 3/23 stable. MRI brain 3/27 with microhemorrhages in cortex, corpaus callosum, basal ganglia, midbrain, pons. Dr. Riley Kill with PM&R added amantadine 3/31, increased to 200mg  BID 4/3. Exam is improving some. Seroquel discontinued 4/20. DC oxycodone and use tylenol or toradol prn for pain if needed. Repeat CT 4/25 due to decrease in level of alertness showing resolution of hemorrhage and no acute hemorrhage. Mild increase in ventricles concerning for hydrocephalus - NSGY reviewed without concern for worsening hydrocephalus and no new reccs ?C1 TP fx, occipital condyle fx - per Dr. 5/25, [facility] J collar in place. ?Acute ventilator dependent respiratory failure s/p trach- cont PSV trials, on guaifenisen for thick secretions, mucomyst, robinul. Changed to #4 cuffless trach 4/17. Chest physiotherapy. Secretions improved today ?R ear laceration - S/P repair, completed Clinda x 5d per Dr. 5/17 ?Hyperglycemia - likely from TF, SSI ?Tongue biting - Bite block in room, continue to use as able. Discussed with OMFS 4/14 and only other recs at  this point are for MMF with or without an integrated bite block. 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 Continue to monitor, keep silver nitrate applicators at bedside. ?Fever - afebrile last 24H. Resp culture - Enterobacter, cefepime per pharmacy recommendations ?  ?FEN - NPO, TF 80 ml/h per PEG, SLP ?R post tib and peroneal DVT - therapeutic LMWH ?ID - fevers and tachycardia 4/25. CXR and UA neg. Cefepime for respiratory cultures as above ?Foley - out. External cath ?  ?Dispo - transfer to med/surg,  PT/OT/SLP. SNF vs CIR pending progress.  ? ?Eric Form, PA-C ?[facility] ?04/20/2022, 8:04 AM ?Please see Amion for pager number during day hours 7:00am-4:30pm ? ? ?*Care during the described time interval was provided by me. I have reviewed this patient's available data, including medical history, events of note, physical examination and test results as part of my evaluation. ? ? ? ?

## 2022-04-21 DIAGNOSIS — R4182 Altered mental status, unspecified: Secondary | ICD-10-CM

## 2022-04-21 LAB — GLUCOSE, CAPILLARY
Glucose-Capillary: 106 mg/dL — ABNORMAL HIGH (ref 70–99)
Glucose-Capillary: 108 mg/dL — ABNORMAL HIGH (ref 70–99)
Glucose-Capillary: 112 mg/dL — ABNORMAL HIGH (ref 70–99)
Glucose-Capillary: 116 mg/dL — ABNORMAL HIGH (ref 70–99)
Glucose-Capillary: 116 mg/dL — ABNORMAL HIGH (ref 70–99)
Glucose-Capillary: 122 mg/dL — ABNORMAL HIGH (ref 70–99)
Glucose-Capillary: 132 mg/dL — ABNORMAL HIGH (ref 70–99)

## 2022-04-21 MED ORDER — PROSOURCE TF PO LIQD
45.0000 mL | Freq: Four times a day (QID) | ORAL | Status: DC
  Administered 2022-04-21 – 2022-05-08 (×70): 45 mL
  Filled 2022-04-21 (×71): qty 45

## 2022-04-21 MED ORDER — JEVITY 1.5 CAL/FIBER PO LIQD
1000.0000 mL | ORAL | Status: DC
  Administered 2022-04-21 – 2022-05-08 (×18): 1000 mL
  Filled 2022-04-21 (×37): qty 1000

## 2022-04-21 MED ORDER — METHYLPHENIDATE HCL 5 MG PO TABS
5.0000 mg | ORAL_TABLET | Freq: Two times a day (BID) | ORAL | Status: DC
  Administered 2022-04-22 – 2022-05-02 (×21): 5 mg
  Filled 2022-04-21 (×22): qty 1
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
## 2022-04-21 NOTE — TOC Progression Note (Signed)
Transition of Care (TOC) - Progression Note  ? ? ?Patient Details  ?Name: Carl Carpenter ?MRN: [id number] ?Date of Birth: 06/18/1986 ? ?Transition of Care (TOC) CM/SW Contact  ?Glennon Mac, RN ?Phone Number: ?04/21/2022, 3:00 PM ? ?Clinical Narrative:    ?Continue to follow for disposition.  Currently patient remains at 35%, and SNF will only take at 28% or less.  Spoke with Maud Deed at [facility] (patient's mother worked at this facility in the past, and it is close to parent's home); her facility administrators will not accept due to patient's high acuity.  Will fax out to area SNFs when patient is able to tolerate 28%, though anticipate difficulty placing with trach and TBI.   ? ? ?Expected Discharge Plan: Skilled Nursing Facility ?Barriers to Discharge: Continued Medical Work up ? ?Expected Discharge Plan and Services ?Expected Discharge Plan: Skilled Nursing Facility ?  ?Discharge Planning Services: CM Consult ?  ?Living arrangements for the past 2 months: Single Family Home ?                ?  ?  ?  ?  ?  ?  ?  ?  ?  ?  ? ? ?Social Determinants of Health (SDOH) Interventions ?  ? ?Readmission Risk Interventions ?   ? View : No data to display.  ?  ?  ?  ? ?Quintella Baton, RN, BSN  ?Trauma/Neuro ICU Case Manager ?[phone] ? ? ?
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
## 2022-04-21 NOTE — Progress Notes (Signed)
Pt is more alert. Still struggling with attention and initiation. Appears to be around a RLAS III.  Would recommend a trial of ritalin 5mg  bid.  I will order for it to start tomorrow.  ? ?Rehab team will follow along.   ? ? ?Thanks! ? ? , MD, FAAPMR ?[facility] ?Medical Director Rehabilitation Services ?04/21/2022  ?

## 2022-04-21 NOTE — Progress Notes (Signed)
? ?Trauma/Critical Care Follow Up Note ? ?Subjective:  ?  ?Overnight Issues:  ?Significant other is at bedside. Undergoing chest PT. Good cough and moderate amount of secretions. Opens eyes this am and squeezes my hand ? ?Objective:  ?Vital signs for last 24 hours: ?Temp:  [97.9 ?F (36.6 ?C)-99.4 ?F (37.4 ?C)] 98.9 ?F (37.2 ?C) (05/02 0346) ?Pulse Rate:  [82-99] 92 (05/02 0410) ?Resp:  [18-26] 18 (05/02 0410) ?BP: (104-130)/(67-89) 111/67 (05/02 0410) ?SpO2:  [91 %-97 %] 95 % (05/02 0410) ?FiO2 (%):  [30 %-35 %] 35 % (05/02 0410) ? ?Hemodynamic parameters for last 24 hours: ?  ? ?Intake/Output from previous day: ?05/01 0701 - 05/02 0700 ?In: 3132 [NG/GT:2332; IV Piggyback:400] ?Out: 1600 [Urine:1600]  ?Intake/Output this shift: ?No intake/output data recorded. ? ?Vent settings for last 24 hours: ?FiO2 (%):  [30 %-35 %] 35 % ? ?Physical Exam:  ?Gen: comfortable, no distress ?Neuro: opens eyes. Follows command to squeeze hand ?Neck: supple ?CV: RRR ?Pulm: unlabored breathing with TC - undergoing chest physiotherapy with good cough and moderate amount of secretions ?Abd: soft, NT ?GU: clear yellow urine in cannister ?Extr: wwp, no edema ? ? ?Results for orders placed or performed during the hospital encounter of 03/10/22 (from the past 24 hour(s))  ?Glucose, capillary     Status: Abnormal  ? Collection Time: 04/20/22 11:27 AM  ?Result Value Ref Range  ? Glucose-Capillary 124 (H) 70 - 99 mg/dL  ?Glucose, capillary     Status: Abnormal  ? Collection Time: 04/20/22  3:49 PM  ?Result Value Ref Range  ? Glucose-Capillary 120 (H) 70 - 99 mg/dL  ?Glucose, capillary     Status: Abnormal  ? Collection Time: 04/20/22  8:22 PM  ?Result Value Ref Range  ? Glucose-Capillary 138 (H) 70 - 99 mg/dL  ?Glucose, capillary     Status: Abnormal  ? Collection Time: 04/21/22 12:17 AM  ?Result Value Ref Range  ? Glucose-Capillary 122 (H) 70 - 99 mg/dL  ?Glucose, capillary     Status: Abnormal  ? Collection Time: 04/21/22  3:49 AM  ?Result  Value Ref Range  ? Glucose-Capillary 132 (H) 70 - 99 mg/dL  ?Glucose, capillary     Status: Abnormal  ? Collection Time: 04/21/22  7:43 AM  ?Result Value Ref Range  ? Glucose-Capillary 116 (H) 70 - 99 mg/dL  ? ? ?Assessment & Plan: ? ?Present on Admission: ? TBI (traumatic brain injury) (HCC) ? ? ? LOS: 42 days  ? ?Additional comments:I reviewed the patient's new clinical lab test results.   and I reviewed the patients new imaging test results.   ? ?[facility] ?  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 Jordan Likes, keppra x7d for sz ppx, TBI therapies once extubated. Repeat CT H 3/23 stable. MRI brain 3/27 with microhemorrhages in cortex, corpaus callosum, basal ganglia, midbrain, pons. Dr. Riley Kill with PM&R added amantadine 3/31, increased to 200mg  BID 4/3. Exam is improving some. Seroquel discontinued 4/20. DC oxycodone and use tylenol or toradol prn for pain if needed. Repeat CT 4/25 due to decrease in level of alertness showing resolution of hemorrhage and no acute hemorrhage. Mild increase in ventricles concerning for hydrocephalus - NSGY reviewed without concern for worsening hydrocephalus and no new reccs ?C1 TP fx, occipital condyle fx - per Dr. 5/25, [facility] J collar in place. ?Acute ventilator dependent respiratory failure s/p trach- cont PSV trials, on guaifenisen for thick secretions, mucomyst, robinul. Changed to #4 cuffless trach 4/17. Chest physiotherapy. Secretions improved ?R ear  laceration - S/P repair, completed Clinda x 5d per Dr. Elijah Birk ?Hyperglycemia - likely from TF, SSI ?Tongue biting - Bite block in room, continue to use as able. Discussed with OMFS 4/14 and only other recs at this point are for MMF with or without an integrated bite block. 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 Continue to monitor, keep silver nitrate applicators at bedside. ?Fever - afebrile last 24H. Resp culture - Enterobacter, cefepime per pharmacy recommendations ?  ?FEN - NPO, TF 80 ml/h per PEG, SLP ?R  post tib and peroneal DVT - therapeutic LMWH ?ID - fevers and tachycardia 4/25. CXR and UA neg. Cefepime for respiratory cultures as above 4/25> ?Foley - out. External cath ?  ?Dispo - transfer to med/surg, PT/OT/SLP. SNF vs CIR pending progress.  ? ?Eric Form, PA-C ?[facility] ?04/21/2022, 8:30 AM ?Please see Amion for pager number during day hours 7:00am-4:30pm ? ? ?*Care during the described time interval was provided by me. I have reviewed this patient's available data, including medical history, events of note, physical examination and test results as part of my evaluation. ? ? ? ?
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
## 2022-04-22 LAB — GLUCOSE, CAPILLARY
Glucose-Capillary: 103 mg/dL — ABNORMAL HIGH (ref 70–99)
Glucose-Capillary: 107 mg/dL — ABNORMAL HIGH (ref 70–99)
Glucose-Capillary: 108 mg/dL — ABNORMAL HIGH (ref 70–99)
Glucose-Capillary: 124 mg/dL — ABNORMAL HIGH (ref 70–99)
Glucose-Capillary: 125 mg/dL — ABNORMAL HIGH (ref 70–99)
Glucose-Capillary: 132 mg/dL — ABNORMAL HIGH (ref 70–99)

## 2022-04-22 NOTE — Progress Notes (Signed)
RT note. ?Patient capped at this time per PA Kabrich. Patient currently capped at this time sat 97%, RR22, HR89. No labored breathing noted at this time. Fiance at bedside. ?[place] at bedside as well it patient starts to desat. Assessed for 15 minutes. RT will continue to monitor, RN aware.  ?
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
## 2022-04-22 NOTE — Progress Notes (Addendum)
? ?Trauma/Critical Care Follow Up Note ? ?Subjective:  ?  ?Overnight Issues:  ?Dad at bedside. Awake this am. Squeezes my hand with left hand and wiggles left toe. Does not f/c on right. ? ?Objective:  ?Vital signs for last 24 hours: ?Temp:  [97.6 ?F (36.4 ?C)-98.4 ?F (36.9 ?C)] 97.6 ?F (36.4 ?C) (05/03 0755) ?Pulse Rate:  [78-98] 84 (05/03 0824) ?Resp:  [12-30] 23 (05/03 0824) ?BP: (110-130)/(63-86) 128/75 (05/03 0755) ?SpO2:  [92 %-97 %] 94 % (05/03 0824) ?FiO2 (%):  [35 %] 35 % (05/03 0824) ? ?Hemodynamic parameters for last 24 hours: ?  ? ?Intake/Output from previous day: ?05/02 0701 - 05/03 0700 ?In: 2153 [NG/GT:1753] ?Out: 1450 [Urine:1450]  ?Intake/Output this shift: ?No intake/output data recorded. ? ?Vent settings for last 24 hours: ?FiO2 (%):  [35 %] 35 % ? ?Physical Exam:  ?Gen: comfortable, no distress ?Neuro: opens eyes. Follows command to squeeze hand ?Neck: supple ?CV: RRR ?Pulm: unlabored breathing with TC. CTAB  ?Abd: soft, NT ?Extr: wwp, calves soft without edema. Mild right  ? ? ?Results for orders placed or performed during the hospital encounter of 03/10/22 (from the past 24 hour(s))  ?Glucose, capillary     Status: Abnormal  ? Collection Time: 04/21/22 11:31 AM  ?Result Value Ref Range  ? Glucose-Capillary 116 (H) 70 - 99 mg/dL  ?Glucose, capillary     Status: Abnormal  ? Collection Time: 04/21/22  3:52 PM  ?Result Value Ref Range  ? Glucose-Capillary 112 (H) 70 - 99 mg/dL  ?Glucose, capillary     Status: Abnormal  ? Collection Time: 04/21/22  8:11 PM  ?Result Value Ref Range  ? Glucose-Capillary 106 (H) 70 - 99 mg/dL  ?Glucose, capillary     Status: Abnormal  ? Collection Time: 04/21/22 11:51 PM  ?Result Value Ref Range  ? Glucose-Capillary 108 (H) 70 - 99 mg/dL  ?Glucose, capillary     Status: Abnormal  ? Collection Time: 04/22/22  3:29 AM  ?Result Value Ref Range  ? Glucose-Capillary 124 (H) 70 - 99 mg/dL  ?Glucose, capillary     Status: Abnormal  ? Collection Time: 04/22/22  8:00 AM   ?Result Value Ref Range  ? Glucose-Capillary 125 (H) 70 - 99 mg/dL  ? ? ?Assessment & Plan: ? ?Present on Admission: ? TBI (traumatic brain injury) (HCC) ? ? ? LOS: 43 days  ? ?Additional comments:I reviewed the patient's new clinical lab test results.   and I reviewed the patients new imaging test results.   ? ?[facility] ?  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 Jordan Likes, keppra x7d for sz ppx, TBI therapies once extubated. Repeat CT H 3/23 stable. MRI brain 3/27 with microhemorrhages in cortex, corpaus callosum, basal ganglia, midbrain, pons. Dr. Riley Kill with PM&R added amantadine 3/31, increased to 200mg  BID 4/3. Exam is improving some. Seroquel discontinued 4/20. DC oxycodone and use tylenol or toradol prn for pain if needed. Repeat CT 4/25 due to decrease in level of alertness showing resolution of hemorrhage and no acute hemorrhage. Mild increase in ventricles concerning for hydrocephalus - NSGY reviewed without concern for worsening hydrocephalus and no new reccs. Ritalin added per rehab ?C1 TP fx, occipital condyle fx - per Dr. 5/25, [facility] J collar in place. Will check with NSGY timeline for removal. ?Acute ventilator dependent respiratory failure s/p trach- cont PSV trials, on guaifenisen for thick secretions, mucomyst, robinul. Changed to #4 cuffless trach 4/17. Chest physiotherapy. Secretions improved. Will check with RT about capping ?R ear  laceration - S/P repair, completed Clinda x 5d per Dr. Elijah Birk ?Hyperglycemia - likely from TF, SSI ?Tongue biting - Bite block in room, continue to use as able. Discussed with OMFS 4/14 and only other recs at this point are for MMF with or without an integrated bite block. 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 Continue to monitor, keep silver nitrate applicators at bedside. ?Fever - afebrile last 24H. Resp culture - Enterobacter, cefepime per pharmacy recommendations ?  ?FEN - NPO, TF 80 ml/h per PEG, SLP ?R post tib and peroneal DVT -  therapeutic LMWH ?ID - fevers and tachycardia 4/25. CXR and UA neg. Cefepime for respiratory cultures as above 4/25>5/3 ?Foley - out. External cath ?  ?Dispo - transfer to med/surg, PT/OT/SLP. SNF vs CIR pending progress.  ? ?Eric Form, PA-C ?[facility] ?04/22/2022, 8:56 AM ?Please see Amion for pager number during day hours 7:00am-4:30pm ? ? ?*Care during the described time interval was provided by me. I have reviewed this patient's available data, including medical history, events of note, physical examination and test results as part of my evaluation. ? ? ? ?

## 2022-04-23 LAB — GLUCOSE, CAPILLARY
Glucose-Capillary: 110 mg/dL — ABNORMAL HIGH (ref 70–99)
Glucose-Capillary: 123 mg/dL — ABNORMAL HIGH (ref 70–99)
Glucose-Capillary: 117 mg/dL — ABNORMAL HIGH (ref 70–99)
Glucose-Capillary: 96 mg/dL (ref 70–99)
Glucose-Capillary: 105 mg/dL — ABNORMAL HIGH (ref 70–99)
Glucose-Capillary: 120 mg/dL — ABNORMAL HIGH (ref 70–99)

## 2022-04-23 NOTE — Progress Notes (Signed)
Pt currently capped at this time, RT had to suction pt 1st and 2nd rounds. Pt had moderate sections. RT will continue to monitor pt as needed. ?

## 2022-04-23 NOTE — Progress Notes (Signed)
? ?Trauma/Critical Care Follow Up Note ? ?Subjective:  ?  ?Overnight Issues:  ?Significant other at bedside. Patient awake and neuro exam consistent with yesterday - squeezes my hand with left hand and wiggles left toes. Does not f/c on right. ?Trach capped yesterday - he is having some secretions requiring suction but also with good cough ? ?Objective:  ?Vital signs for last 24 hours: ?Temp:  [97.5 ?F (36.4 ?C)-98.4 ?F (36.9 ?C)] 98.4 ?F (36.9 ?C) (05/04 0750) ?Pulse Rate:  [74-90] 74 (05/04 0750) ?Resp:  [17-22] 17 (05/04 0750) ?BP: (116-130)/(63-83) 116/63 (05/04 0750) ?SpO2:  [94 %-98 %] 98 % (05/04 0750) ?FiO2 (%):  [35 %] 35 % (05/03 1508) ? ?Hemodynamic parameters for last 24 hours: ?  ? ?Intake/Output from previous day: ?05/03 0701 - 05/04 0700 ?In: -  ?Out: 1700 [Urine:1700]  ?Intake/Output this shift: ?No intake/output data recorded. ? ?Vent settings for last 24 hours: ?FiO2 (%):  [35 %] 35 % ? ?Physical Exam:  ?Gen: comfortable, no distress ?Neuro: opens eyes. Follows command to squeeze hand ?Neck: supple ?CV: RRR ?Pulm: unlabored breathing with trach capped. CTAB  ?Abd: soft, NT ?Extr: wwp, calves soft without edema. ? ? ?Results for orders placed or performed during the hospital encounter of 03/10/22 (from the past 24 hour(s))  ?Glucose, capillary     Status: Abnormal  ? Collection Time: 04/22/22 11:37 AM  ?Result Value Ref Range  ? Glucose-Capillary 132 (H) 70 - 99 mg/dL  ?Glucose, capillary     Status: Abnormal  ? Collection Time: 04/22/22  3:40 PM  ?Result Value Ref Range  ? Glucose-Capillary 103 (H) 70 - 99 mg/dL  ?Glucose, capillary     Status: Abnormal  ? Collection Time: 04/22/22  7:55 PM  ?Result Value Ref Range  ? Glucose-Capillary 107 (H) 70 - 99 mg/dL  ?Glucose, capillary     Status: Abnormal  ? Collection Time: 04/22/22 11:37 PM  ?Result Value Ref Range  ? Glucose-Capillary 108 (H) 70 - 99 mg/dL  ?Glucose, capillary     Status: Abnormal  ? Collection Time: 04/23/22  3:38 AM  ?Result Value  Ref Range  ? Glucose-Capillary 110 (H) 70 - 99 mg/dL  ?Glucose, capillary     Status: Abnormal  ? Collection Time: 04/23/22  7:46 AM  ?Result Value Ref Range  ? Glucose-Capillary 123 (H) 70 - 99 mg/dL  ? ? ?Assessment & Plan: ? ?Present on Admission: ? TBI (traumatic brain injury) ([place]) ? ? ? LOS: 44 days  ? ?Additional comments:I reviewed the patient's new clinical lab test results.   and I reviewed the patients new imaging test results.   ? ?[facility] ?  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 Annette Stable, keppra x7d for sz ppx, TBI therapies once extubated. Repeat CT H 3/23 stable. MRI brain 3/27 with microhemorrhages in cortex, corpaus callosum, basal ganglia, midbrain, pons. Dr. Naaman Plummer with PM&R added amantadine 3/31, increased to 200mg  BID 4/3. Exam is improving some. Seroquel discontinued 4/20. DC oxycodone and use tylenol or toradol prn for pain if needed. Repeat CT 4/25 due to decrease in level of alertness showing resolution of hemorrhage and no acute hemorrhage. Mild increase in ventricles concerning for hydrocephalus - NSGY reviewed without concern for worsening hydrocephalus and no new reccs. Ritalin added per rehab ?C1 TP fx, occipital condyle fx - per Dr. Annette Stable, [facility] J collar in place  - discussed with NSGY and removed 5/3 ?Acute ventilator dependent respiratory failure s/p trach- cont PSV trials, on guaifenisen for  thick secretions, mucomyst, robinul. Changed to #4 cuffless trach 4/17. Chest physiotherapy. Secretions improved. Trach capped 5/3 ?R ear laceration - S/P repair, completed Clinda x 5d per Dr. Marcelline Deist ?Hyperglycemia - likely from TF, SSI ?Tongue biting - Bite block in room, continue to use as able. Discussed with OMFS 4/14 and only other recs at this point are for MMF with or without an integrated bite block. 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 Continue to monitor, keep silver nitrate applicators at bedside. ?Fever - afebrile last 24H. Resp culture - Enterobacter,  cefepime per pharmacy recommendations. Abx therapy completed ?  ?FEN - NPO, TF 80 ml/h per PEG, SLP ?R post tib and peroneal DVT - therapeutic LMWH transitioned to eliquis 4/28 ?ID - fevers and tachycardia 4/25. CXR and UA neg. Cefepime for respiratory cultures as above 4/25>5/3 ?Foley - out. External cath ?  ?Dispo - transfer to med/surg, PT/OT/SLP. SNF vs CIR pending progress.  ? ?Winferd Humphrey, PA-C ?[facility] ?04/23/2022, 9:11 AM ?Please see Amion for pager number during day hours 7:00am-4:30pm ? ? ?*Care during the described time interval was provided by me. I have reviewed this patient's available data, including medical history, events of note, physical examination and test results as part of my evaluation. ? ? ? ?

## 2022-04-23 NOTE — NC FL2 (Addendum)
?[place] MEDICAID FL2 LEVEL OF CARE SCREENING TOOL  ?  ? ?IDENTIFICATION  ?Patient Name: ?Carl Carpenter Birthdate: May 19, 1986 Sex: male Admission Date (Current Location): ?03/10/2022  ?[place] and [place] Number: ? [place] ?Pending Facility and Address:  ?[facility], [place] [address], [place], [place] [address] ?     Provider Number: ?[id number]  ?Attending Physician Name and Address:  ?Md, Trauma, MD ? Relative Name and Phone Number:  ?Rakesh, [place]. (Father) [phone]   ?Current Level of Care: ?Hospital Recommended Level of Care: ?[place] Prior Approval Number: ?  ? ?Date Approved/Denied: ?  PASRR Number: ?Pending ? ?Discharge Plan: ?SNF ?  ? ?Current Diagnoses: ?Patient Active Problem List  ? Diagnosis Date Noted  ? Pressure injury of skin 04/01/2022  ? TBI (traumatic brain injury) ([place]) 03/10/2022  ? ? ?Orientation RESPIRATION BLADDER Height & Weight   ?  ?  ?  (On room air currently) Incontinent Weight: 105 kg ?Height:  6' 5"  (195.6 cm)  ?BEHAVIORAL SYMPTOMS/MOOD NEUROLOGICAL BOWEL NUTRITION STATUS  ?    Incontinent Feeding tube (PEG tube; Pivot at 80/h)  ?AMBULATORY STATUS COMMUNICATION OF NEEDS Skin   ?Total Care Does not communicate Skin abrasions (arms/legs) ?  ?  ?  ?    ?     ?     ? ? ?Personal Care Assistance Level of Assistance  ?Bathing, Feeding, Dressing, Total care Bathing Assistance: Maximum assistance ?Feeding assistance: Maximum assistance ?Dressing Assistance: Maximum assistance ?Total Care Assistance: Maximum assistance  ? ?Functional Limitations Info  ?    ?  ?   ? ? ?SPECIAL CARE FACTORS FREQUENCY  ?PT (By licensed PT), OT (By licensed OT), Speech therapy   ?  ?PT Frequency: 5x weekly ?OT Frequency: 5x weekly ?  ?  ?Speech Therapy Frequency: 5x weekly ?   ? ? ?Contractures Contractures Info: Not present  ? ? ?Additional Factors Info  ?Code Status, Allergies, Insulin Sliding Scale Code Status Info: Full code ?Allergies Info: Peanut containing  drug products-extreme stomach cramps; Penicillins- swelling ?  ?Insulin Sliding Scale Info: Novolog 0-15 q4h ?  ?   ? ?Current Medications (04/23/2022):  This is the current hospital active medication list ?Current Facility-Administered Medications  ?Medication Dose Route Frequency Provider Last Rate Last Admin  ? acetaminophen (TYLENOL) 160 MG/5ML solution 1,000 mg  1,000 mg Per Tube Q6H PRN Barkley Boards R, PA-C   1,000 mg at 04/22/22 1613  ? albuterol (PROVENTIL) (2.5 MG/3ML) 0.083% nebulizer solution 2.5 mg  2.5 mg Nebulization Q6H PRN Stechschulte, Nickola Major, MD   2.5 mg at 04/15/22 1114  ? amantadine (SYMMETREL) 50 MG/5ML solution 200 mg  200 mg Per Tube BID Georganna Skeans, MD   200 mg at 04/23/22 1224  ? apixaban (ELIQUIS) tablet 5 mg  5 mg Per Tube BID Stechschulte, Nickola Major, MD   5 mg at 04/23/22 5035  ? chlorhexidine gluconate (MEDLINE KIT) (PERIDEX) 0.12 % solution 15 mL  15 mL Mouth Rinse BID Jillyn Ledger, PA-C   15 mL at 04/23/22 4656  ? Chlorhexidine Gluconate Cloth 2 % PADS 6 each  6 each Topical Daily Jillyn Ledger, [place]   6 each at 04/22/22 2149  ? docusate (COLACE) 50 MG/5ML liquid 100 mg  100 mg Per Tube BID Jillyn Ledger, PA-C   100 mg at 04/23/22 8127  ? feeding supplement (JEVITY 1.5 CAL/FIBER) liquid 1,000 mL  1,000 mL Per Tube Continuous Jesusita Oka, MD  80 mL/hr at 04/22/22 1300 1,000 mL at 04/22/22 1300  ? feeding supplement (PROSource TF) liquid 45 mL  45 mL Per Tube QID Jesusita Oka, MD   45 mL at 04/23/22 1700  ? glycopyrrolate (ROBINUL) injection 0.4 mg  0.4 mg Intravenous TID Jesusita Oka, MD   0.4 mg at 04/23/22 1600  ? guaiFENesin (ROBITUSSIN) 100 MG/5ML liquid 15 mL  15 mL Per Tube Q4H Saverio Danker, PA-C   15 mL at 04/23/22 1600  ? insulin aspart (novoLOG) injection 0-15 Units  0-15 Units Subcutaneous Q4H Jillyn Ledger, [place]   2 Units at 04/23/22 0761  ? MEDLINE mouth rinse  15 mL Mouth Rinse 10 times per day Jillyn Ledger, PA-C   15 mL at 04/23/22 1742   ? methylphenidate (RITALIN) tablet 5 mg  5 mg Per Tube BID Meredith Staggers, MD   5 mg at 04/23/22 1223  ? metoprolol tartrate (LOPRESSOR) 25 mg/10 mL oral suspension 50 mg  50 mg Per Tube BID Norm Parcel, PA-C   50 mg at 04/23/22 9155  ? mupirocin cream (BACTROBAN) 2 %   Topical Daily Georganna Skeans, MD   Given at 04/23/22 [phone]  ? ondansetron (ZOFRAN-ODT) disintegrating tablet 4 mg  4 mg Oral Q6H PRN Jillyn Ledger, PA-C      ? Or  ? ondansetron (ZOFRAN) injection 4 mg  4 mg Intravenous Q6H PRN Maczis, Barth Kirks, PA-C      ? polyethylene glycol (MIRALAX / GLYCOLAX) packet 17 g  17 g Per Tube Daily PRN Norm Parcel, PA-C   17 g at 04/19/22 1833  ? zinc oxide (BALMEX) 11.3 % cream   Topical BID Stechschulte, Nickola Major, MD   Given at 04/23/22 4232  ? ? ? ?Discharge Medications: ?Please see discharge summary for a list of discharge medications. ? ?Relevant Imaging Results: ? ?Relevant Lab Results: ? ? ?Additional Information ?SS# [id number] ? ?Reinaldo Raddle, RN, BSN  ?Trauma/Neuro ICU Case Manager ?[phone] ? ? ? ? ?

## 2022-04-23 NOTE — Progress Notes (Signed)
RT uncapped pt due to inadequate cough and frequent suctioning. ?
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
## 2022-04-23 NOTE — Progress Notes (Signed)
Pt is capped but is still needing suctioning each round. Pt has moderate secretions. RT will monitor. ?

## 2022-04-24 LAB — GLUCOSE, CAPILLARY
Glucose-Capillary: 120 mg/dL — ABNORMAL HIGH (ref 70–99)
Glucose-Capillary: 111 mg/dL — ABNORMAL HIGH (ref 70–99)
Glucose-Capillary: 121 mg/dL — ABNORMAL HIGH (ref 70–99)
Glucose-Capillary: 131 mg/dL — ABNORMAL HIGH (ref 70–99)
Glucose-Capillary: 132 mg/dL — ABNORMAL HIGH (ref 70–99)
Glucose-Capillary: 120 mg/dL — ABNORMAL HIGH (ref 70–99)
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
## 2022-04-24 NOTE — Progress Notes (Signed)
? ?Trauma/Critical Care Follow Up Note ? ?Subjective:  ?  ?Overnight Issues:  ?Dad at bedside.  No new issues.  Sleeping currently ? ?Objective:  ?Vital signs for last 24 hours: ?Temp:  [97.5 ?F (36.4 ?C)-98.5 ?F (36.9 ?C)] 98.5 ?F (36.9 ?C) (05/05 0319) ?Pulse Rate:  [78-94] 88 (05/05 0831) ?Resp:  [15-25] 25 (05/05 0831) ?BP: (113-141)/(77-91) 113/77 (05/05 0351) ?SpO2:  [95 %-99 %] 97 % (05/05 0351) ?FiO2 (%):  [21 %] 21 % (05/05 0351) ? ?Intake/Output from previous day: ?05/04 0701 - 05/05 0700 ?In: -  ?Out: 2600 [Urine:2600]  ?Intake/Output this shift: ?No intake/output data recorded. ? ?Vent settings for last 24 hours: ?FiO2 (%):  [21 %] 21 % ? ?Physical Exam:  ?Gen: comfortable, no distress ?Neuro: sleeping and doesn't wake up for me today ?Neck: supple ?CV: RRR ?Pulm: unlabored breathing with trach on TC. CTAB.  No significant secretions noted at time of my exam ?Abd: soft, NT ?GU: voiding spontaneously ?Extr: wwp, calves soft without edema. ? ? ?Results for orders placed or performed during the hospital encounter of 03/10/22 (from the past 24 hour(s))  ?Glucose, capillary     Status: Abnormal  ? Collection Time: 04/23/22 11:23 AM  ?Result Value Ref Range  ? Glucose-Capillary 117 (H) 70 - 99 mg/dL  ?Glucose, capillary     Status: None  ? Collection Time: 04/23/22  3:39 PM  ?Result Value Ref Range  ? Glucose-Capillary 96 70 - 99 mg/dL  ?Glucose, capillary     Status: Abnormal  ? Collection Time: 04/23/22  7:56 PM  ?Result Value Ref Range  ? Glucose-Capillary 105 (H) 70 - 99 mg/dL  ?Glucose, capillary     Status: Abnormal  ? Collection Time: 04/23/22 11:18 PM  ?Result Value Ref Range  ? Glucose-Capillary 120 (H) 70 - 99 mg/dL  ?Glucose, capillary     Status: Abnormal  ? Collection Time: 04/24/22  3:18 AM  ?Result Value Ref Range  ? Glucose-Capillary 120 (H) 70 - 99 mg/dL  ?Glucose, capillary     Status: Abnormal  ? Collection Time: 04/24/22  8:13 AM  ?Result Value Ref Range  ? Glucose-Capillary 111 (H) 70 -  99 mg/dL  ? ? ?Assessment & Plan: ? ?Present on Admission: ? TBI (traumatic brain injury) (HCC) ? ? ? LOS: 45 days  ? ?Additional comments:I reviewed the patient's new clinical lab test results.   and I reviewed the patients new imaging test results.   ? ?[facility] ?  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 Jordan Likes, keppra x7d for sz ppx, TBI therapies once extubated. Repeat CT H 3/23 stable. MRI brain 3/27 with microhemorrhages in cortex, corpaus callosum, basal ganglia, midbrain, pons. Dr. Riley Kill with PM&R added amantadine 3/31, increased to 200mg  BID 4/3. Exam is improving some. Seroquel discontinued 4/20. DC oxycodone and use tylenol or toradol prn for pain if needed. Repeat CT 4/25 due to decrease in level of alertness showing resolution of hemorrhage and no acute hemorrhage. Mild increase in ventricles concerning for hydrocephalus - NSGY reviewed without concern for worsening hydrocephalus and no new reccs. Ritalin added per rehab ?C1 TP fx, occipital condyle fx - per Dr. 5/25, [facility] J collar in place  - discussed with NSGY and removed 5/3 ?Acute ventilator dependent respiratory failure s/p trach- cont PSV trials, on guaifenisen for thick secretions, mucomyst, robinul. Changed to #4 cuffless trach 4/17. Chest physiotherapy. Secretions improved. Trach capped 5/3, uncapped per RT on 5/4, but can likely be recapped.  Doing well  currently ?R ear laceration - S/P repair, completed Clinda x 5d per Dr. Elijah Birk ?Hyperglycemia - likely from TF, SSI ?Tongue biting - Bite block in room, continue to use as able. Discussed with OMFS 4/14 and only other recs at this point are for MMF with or without an integrated bite block. 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 Continue to monitor, keep silver nitrate applicators at bedside. ?Fever - afebrile last 24H. Resp culture - Enterobacter, cefepime per pharmacy recommendations. Abx therapy completed 5/3 ?  ?FEN - NPO, TF 80 ml/h per PEG, SLP ?R post tib and  peroneal DVT - therapeutic LMWH transitioned to eliquis 4/28 ?ID - fevers and tachycardia 4/25. CXR and UA neg. Cefepime for respiratory cultures as above 4/25>5/3 ?Foley - out. External cath ?  ?Dispo - transfer to med/surg, PT/OT/SLP. SNF vs CIR pending progress.  ? ?Letha Cape, PA-C ?[facility] ?04/24/2022, 10:10 AM ?Please see Amion for pager number during day hours 7:00am-4:30pm ? ? ?*Care during the described time interval was provided by me. I have reviewed this patient's available data, including medical history, events of note, physical examination and test results as part of my evaluation. ? ? ? ?

## 2022-04-24 NOTE — Progress Notes (Signed)
SLP Cancellation Note ? ?Patient Details ?Name: Carl Carpenter ?MRN: [id number] ?DOB: [birth year] ? ? ?Cancelled treatment:       Reason Eval/Treat Not Completed: Fatigue/lethargy limiting ability to participate. Pt too tired after working with PT/OT this morning. Will f/u for ongoing therapy as able. ? ? ? ?Mahala Menghini., M.A. CCC-SLP ?[facility] ?Office [phone] ? ?Secure chat preferred ? ?04/24/2022, 12:10 PM ?

## 2022-04-25 LAB — GLUCOSE, CAPILLARY
Glucose-Capillary: 118 mg/dL — ABNORMAL HIGH (ref 70–99)
Glucose-Capillary: 118 mg/dL — ABNORMAL HIGH (ref 70–99)
Glucose-Capillary: 125 mg/dL — ABNORMAL HIGH (ref 70–99)
Glucose-Capillary: 128 mg/dL — ABNORMAL HIGH (ref 70–99)
Glucose-Capillary: 129 mg/dL — ABNORMAL HIGH (ref 70–99)
Glucose-Capillary: 129 mg/dL — ABNORMAL HIGH (ref 70–99)

## 2022-04-25 NOTE — Progress Notes (Signed)
Patient sleeping at this time, asked to wait for CPT. Will check on patient at later time to do CPT. Vitals stable. RT will continue to monitor.  ?

## 2022-04-25 NOTE — Progress Notes (Signed)
? ?Trauma/Critical Care Follow Up Note ? ?Subjective:  ?  ?Overnight Issues:  ?No acute changes. Sleeping comfortably. ? ?Objective:  ?Vital signs for last 24 hours: ?Temp:  [98.2 ?F (36.8 ?C)-98.6 ?F (37 ?C)] 98.5 ?F (36.9 ?C) (05/06 0744) ?Pulse Rate:  [83-92] 90 (05/06 0755) ?Resp:  [20-25] 20 (05/06 0755) ?BP: (125-135)/(79-85) 129/80 (05/06 0744) ?SpO2:  [96 %-98 %] 98 % (05/06 0744) ?FiO2 (%):  [21 %-28 %] 28 % (05/06 0755) ? ?Intake/Output from previous day: ?05/05 0701 - 05/06 0700 ?In: -  ?Out: 2000 [Urine:2000]  ?Intake/Output this shift: ?No intake/output data recorded. ? ?Vent settings for last 24 hours: ?FiO2 (%):  [21 %-28 %] 28 % ? ?Physical Exam:  ?Gen: comfortable, no distress ?Neuro: sleeping ?Neck: supple ?CV: RRR ?Pulm: unlabored breathing with trach on trach collar. ?Abd: soft, NT ?Extr: wwp, calves soft without edema. ? ? ?Results for orders placed or performed during the hospital encounter of 03/10/22 (from the past 24 hour(s))  ?Glucose, capillary     Status: Abnormal  ? Collection Time: 04/24/22 11:27 AM  ?Result Value Ref Range  ? Glucose-Capillary 121 (H) 70 - 99 mg/dL  ?Glucose, capillary     Status: Abnormal  ? Collection Time: 04/24/22  3:35 PM  ?Result Value Ref Range  ? Glucose-Capillary 131 (H) 70 - 99 mg/dL  ?Glucose, capillary     Status: Abnormal  ? Collection Time: 04/24/22  7:56 PM  ?Result Value Ref Range  ? Glucose-Capillary 132 (H) 70 - 99 mg/dL  ?Glucose, capillary     Status: Abnormal  ? Collection Time: 04/24/22 11:13 PM  ?Result Value Ref Range  ? Glucose-Capillary 120 (H) 70 - 99 mg/dL  ?Glucose, capillary     Status: Abnormal  ? Collection Time: 04/25/22  3:16 AM  ?Result Value Ref Range  ? Glucose-Capillary 125 (H) 70 - 99 mg/dL  ?Glucose, capillary     Status: Abnormal  ? Collection Time: 04/25/22  7:52 AM  ?Result Value Ref Range  ? Glucose-Capillary 129 (H) 70 - 99 mg/dL  ? ? ?Assessment & Plan: ? ?Present on Admission: ? TBI (traumatic brain injury) (HCC) ? ? ?  LOS: 46 days  ? ?Additional comments:I reviewed the patient's new clinical lab test results.   and I reviewed the patients new imaging test results.   ? ?[facility] ?  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 Jordan Likes, keppra x7d for sz ppx, TBI therapies once extubated. Repeat CT H 3/23 stable. MRI brain 3/27 with microhemorrhages in cortex, corpaus callosum, basal ganglia, midbrain, pons. Dr. Riley Kill with PM&R added amantadine 3/31, increased to 200mg  BID 4/3. Exam is improving some. Seroquel discontinued 4/20. DC oxycodone and use tylenol or toradol prn for pain if needed. Repeat CT 4/25 due to decrease in level of alertness showing resolution of hemorrhage and no acute hemorrhage. Mild increase in ventricles concerning for hydrocephalus - NSGY reviewed without concern for worsening hydrocephalus and no new reccs. Ritalin added per rehab ?C1 TP fx, occipital condyle fx - per Dr. 5/25, [facility] J collar in place  - discussed with NSGY and removed 5/3 ?Acute ventilator dependent respiratory failure s/p trach- cont PSV trials, on guaifenisen for thick secretions, mucomyst, robinul. Changed to #4 cuffless trach 4/17. Chest physiotherapy. Secretions improved. Trach capped 5/3, uncapped per RT on 5/4, remains stable on trach collar. ?R ear laceration - S/P repair, completed Clinda x 5d per Dr. 7/4 ?Hyperglycemia - likely from TF, SSI ?Tongue biting - Bite block in  room, continue to use as able. Discussed with OMFS 4/14 and only other recs at this point are for MMF with or without an integrated bite block. 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 Continue to monitor, keep silver nitrate applicators at bedside. ?Pneumonia - Resolved. Resp culture - Enterobacter, cefepime per pharmacy recommendations. Abx therapy completed 5/3 ?  ?FEN - NPO, TF 80 ml/h per PEG, SLP ?R post tib and peroneal DVT - therapeutic LMWH transitioned to eliquis 4/28 ?ID - fevers and tachycardia 4/25. CXR and UA neg. Cefepime  completed for positive resp cultures as above. ?Foley - out. External cath ?  ?Dispo - transfer to med/surg, PT/OT/SLP. SNF vs CIR pending progress.  ? ?Fritzi Mandes, MD ?[facility] ?04/25/2022, 10:44 AM ?Please see Amion for pager number during day hours 7:00am-4:30pm ? ? ?*Care during the described time interval was provided by me. I have reviewed this patient's available data, including medical history, events of note, physical examination and test results as part of my evaluation. ? ? ? ?

## 2022-04-26 DIAGNOSIS — R404 Transient alteration of awareness: Secondary | ICD-10-CM

## 2022-04-26 LAB — GLUCOSE, CAPILLARY
Glucose-Capillary: 112 mg/dL — ABNORMAL HIGH (ref 70–99)
Glucose-Capillary: 114 mg/dL — ABNORMAL HIGH (ref 70–99)
Glucose-Capillary: 118 mg/dL — ABNORMAL HIGH (ref 70–99)
Glucose-Capillary: 122 mg/dL — ABNORMAL HIGH (ref 70–99)
Glucose-Capillary: 124 mg/dL — ABNORMAL HIGH (ref 70–99)
Glucose-Capillary: 130 mg/dL — ABNORMAL HIGH (ref 70–99)

## 2022-04-26 MED ORDER — LEVETIRACETAM 500 MG PO TABS
500.0000 mg | ORAL_TABLET | Freq: Two times a day (BID) | ORAL | Status: DC
  Administered 2022-04-26 – 2022-04-28 (×5): 500 mg via ORAL
  Filled 2022-04-26 (×5): qty 1

## 2022-04-26 MED ORDER — MIDAZOLAM HCL 2 MG/2ML IJ SOLN
0.5000 mg | INTRAMUSCULAR | Status: DC | PRN
  Administered 2022-04-27: 0.5 mg via INTRAVENOUS
  Filled 2022-04-26: qty 2

## 2022-04-26 NOTE — Progress Notes (Signed)
CPT held at this time due to patient resting and just obtaining a copious amount of secretions from tracheal suction.  ?

## 2022-04-26 NOTE — Progress Notes (Signed)
? ?Trauma/Critical Care Follow Up Note ? ?Subjective:  ?  ?Overnight Issues:  ?Had an episode of shaking head and arm yesterday.  I saw one again today.   ? ?Objective:  ?Vital signs for last 24 hours: ?Temp:  [98.4 ?F (36.9 ?C)-99.4 ?F (37.4 ?C)] 98.7 ?F (37.1 ?C) (05/07 2458) ?Pulse Rate:  [81-100] 89 (05/07 0839) ?Resp:  [13-26] 22 (05/07 0839) ?BP: (123-138)/(80-93) 127/83 (05/07 0998) ?SpO2:  [96 %-99 %] 97 % (05/07 0839) ?FiO2 (%):  [21 %] 21 % (05/07 0839) ? ?Intake/Output from previous day: ?05/06 0701 - 05/07 0700 ?In: -  ?Out: 2200 [Urine:2200]  ?Intake/Output this shift: ?No intake/output data recorded. ? ?Vent settings for last 24 hours: ?FiO2 (%):  [21 %] 21 % ? ?Physical Exam:  ?Gen: comfortable, no distress except when he has to cough and expel mucus from trach.  ?Neuro: eyes open.  Anicteric.   ?Neck: supple ?CV: RRR, no murmurs.  ?Pulm: unlabored breathing with trach on trach collar. Clear upper fields, decreased at bases.  ?Abd: soft, NT ?Extr: wwp, calves soft without edema. ? ? ?Results for orders placed or performed during the hospital encounter of 03/10/22 (from the past 24 hour(s))  ?Glucose, capillary     Status: Abnormal  ? Collection Time: 04/25/22 11:23 AM  ?Result Value Ref Range  ? Glucose-Capillary 129 (H) 70 - 99 mg/dL  ?Glucose, capillary     Status: Abnormal  ? Collection Time: 04/25/22  3:49 PM  ?Result Value Ref Range  ? Glucose-Capillary 128 (H) 70 - 99 mg/dL  ?Glucose, capillary     Status: Abnormal  ? Collection Time: 04/25/22  7:41 PM  ?Result Value Ref Range  ? Glucose-Capillary 118 (H) 70 - 99 mg/dL  ?Glucose, capillary     Status: Abnormal  ? Collection Time: 04/25/22 11:50 PM  ?Result Value Ref Range  ? Glucose-Capillary 118 (H) 70 - 99 mg/dL  ?Glucose, capillary     Status: Abnormal  ? Collection Time: 04/26/22  3:50 AM  ?Result Value Ref Range  ? Glucose-Capillary 122 (H) 70 - 99 mg/dL  ?Glucose, capillary     Status: Abnormal  ? Collection Time: 04/26/22  7:42 AM   ?Result Value Ref Range  ? Glucose-Capillary 130 (H) 70 - 99 mg/dL  ? ? ?Assessment & Plan: ? ?Present on Admission: ? TBI (traumatic brain injury) (HCC) ? ? ? LOS: 47 days  ? ?Additional comments:I reviewed the patient's new clinical lab test results.   and I reviewed the patients new imaging test results.   ? ?[facility] ?  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 Jordan Likes, keppra x7d for sz ppx, TBI therapies once extubated. Repeat CT H 3/23 stable. MRI brain 3/27 with microhemorrhages in cortex, corpaus callosum, basal ganglia, midbrain, pons. Dr. Riley Kill with PM&R added amantadine 3/31, increased to 200mg  BID 4/3. Exam is improving some. Seroquel discontinued 4/20. DC oxycodone and use tylenol or toradol prn for pain if needed. Repeat CT 4/25 due to decrease in level of alertness showing resolution of hemorrhage and no acute hemorrhage. Mild increase in ventricles concerning for hydrocephalus - NSGY reviewed without concern for worsening hydrocephalus and no new reccs. Ritalin added per rehab ?Added back keppra for possible seizure activity.   ?C1 TP fx, occipital condyle fx - per Dr. 5/25, [facility] J collar in place  - discussed with NSGY and removed 5/3 ?Acute ventilator dependent respiratory failure s/p trach- cont PSV trials, on guaifenisen for thick secretions, mucomyst, robinul. Changed to #  4 cuffless trach 4/17. Chest physiotherapy. Secretions improved. Trach capped 5/3, uncapped per RT on 5/4, remains stable on trach collar. Mother at bedside, helping with suctioning. ?R ear laceration - S/P repair, completed Clinda x 5d per Dr. Elijah Birk ?Hyperglycemia - likely from TF, SSI ?Tongue biting - Bite block in room, continue to use as able. Discussed with OMFS 4/14 and only other recs at this point are for MMF with or without an integrated bite block. 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 Continue to monitor, keep silver nitrate applicators at bedside. ?Pneumonia - Resolved. Resp culture -  Enterobacter, cefepime per pharmacy recommendations. Abx therapy completed 5/3 ?  ?FEN - NPO, TF 80 ml/h per PEG, SLP ?R post tib and peroneal DVT - therapeutic LMWH transitioned to eliquis 4/28 ?ID - fevers and tachycardia 4/25. CXR and UA neg. Cefepime completed for positive resp cultures as above. ?Foley - out. External cath ?  ?Dispo -  PT/OT/SLP. SNF vs CIR pending progress.  ? ?Maudry Diego, MD FACS ?Surgical Oncology, General Surgery, Trauma and Critical Care ?[facility], [place] ?[phone] for weekday/non holidays ?Check [email or web address] for coverage night/weekend/holidays ? ?Do not use SecureChat as it is not reliable for timely patient care.   ? ?04/26/2022, 10:32 AM ?Please see Amion for pager number during day hours 7:00am-4:30pm ? ? ?*Care during the described time interval was provided by me. I have reviewed this patient's available data, including medical history, events of note, physical examination and test results as part of my evaluation. ? ? ? ?

## 2022-04-26 NOTE — Progress Notes (Signed)
Pt had an episode of shaking /tremor of head and left hand which lasted approximately 6 mins. Md notified no new orders received. Will continue to monitor ,family at bedside . ?
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
## 2022-04-27 DIAGNOSIS — G969 Disorder of central nervous system, unspecified: Secondary | ICD-10-CM

## 2022-04-27 DIAGNOSIS — R251 Tremor, unspecified: Secondary | ICD-10-CM

## 2022-04-27 LAB — GLUCOSE, CAPILLARY
Glucose-Capillary: 111 mg/dL — ABNORMAL HIGH (ref 70–99)
Glucose-Capillary: 114 mg/dL — ABNORMAL HIGH (ref 70–99)
Glucose-Capillary: 124 mg/dL — ABNORMAL HIGH (ref 70–99)
Glucose-Capillary: 124 mg/dL — ABNORMAL HIGH (ref 70–99)
Glucose-Capillary: 133 mg/dL — ABNORMAL HIGH (ref 70–99)
Glucose-Capillary: 141 mg/dL — ABNORMAL HIGH (ref 70–99)
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
## 2022-04-27 NOTE — Progress Notes (Signed)
0720 during shift change, as both shifts RN walked into room, pt was witnessed to be having continuous shaking of head. LUE have occasional twitching movement with it at times. This activity continued and versed given after effective. Total activity lasted . Dionne Bucy RN ? ? 04/27/22 0725  ?Vitals  ?Temp 98.6 ?F (37 ?C)  ?Temp Source Axillary  ?BP 121/89  ?MAP (mmHg) 97  ?BP Location Right Arm  ?BP Method Automatic  ?Patient Position (if appropriate) Lying  ?Pulse Rate 93  ?Pulse Rate Source Monitor  ?ECG Heart Rate 94  ?Resp (!) 23  ?MEWS COLOR  ?MEWS Score Color Green  ?Oxygen Therapy  ?SpO2 92 %  ?O2 Device Tracheostomy Collar  ?O2 Flow Rate (L/min) 5 L/min  ?FiO2 (%) 21 %  ?Patient Activity (if Appropriate) In bed  ?Pulse Oximetry Type Continuous  ?MEWS Score  ?MEWS Temp 0  ?MEWS Systolic 0  ?MEWS Pulse 0  ?MEWS RR 1  ?MEWS LOC 0  ?MEWS Score 1  ? ? ?
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
## 2022-04-27 NOTE — Progress Notes (Signed)
Pt had shaking/tremors of the head at 0014, 0437, and 0513 lasted no longer than 5 minutes. Family at bedside made RN aware  ?

## 2022-04-27 NOTE — Hospital Course (Signed)
Tbi  ?Head bobbing ?Twitching ? ?
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
## 2022-04-28 LAB — CBC
HCT: 39.4 % (ref 39.0–52.0)
Hemoglobin: 13.3 g/dL (ref 13.0–17.0)
MCH: 31.7 pg (ref 26.0–34.0)
MCHC: 33.8 g/dL (ref 30.0–36.0)
MCV: 93.8 fL (ref 80.0–100.0)
Platelets: 186 10*3/uL (ref 150–400)
RBC: 4.2 MIL/uL — ABNORMAL LOW (ref 4.22–5.81)
RDW: 13.8 % (ref 11.5–15.5)
WBC: 9.5 10*3/uL (ref 4.0–10.5)
nRBC: 0 % (ref 0.0–0.2)

## 2022-04-28 LAB — GLUCOSE, CAPILLARY
Glucose-Capillary: 133 mg/dL — ABNORMAL HIGH (ref 70–99)
Glucose-Capillary: 114 mg/dL — ABNORMAL HIGH (ref 70–99)
Glucose-Capillary: 146 mg/dL — ABNORMAL HIGH (ref 70–99)
Glucose-Capillary: 117 mg/dL — ABNORMAL HIGH (ref 70–99)
Glucose-Capillary: 127 mg/dL — ABNORMAL HIGH (ref 70–99)
Glucose-Capillary: 129 mg/dL — ABNORMAL HIGH (ref 70–99)
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
## 2022-04-28 NOTE — Progress Notes (Signed)
? ?Progress Note ? ?41 Days Post-Op  ?Subjective: ?Pt followed some commands for me this AM but not all. No tremor noted on my exam. Tolerating TF. Having bowel function. Afebrile. Fiancee at bedside this AM.  ? ?Objective: ?Vital signs in last 24 hours: ?Temp:  [97.7 ?F (36.5 ?C)-99.4 ?F (37.4 ?C)] 98.6 ?F (37 ?C) (05/09 0801) ?Pulse Rate:  [82-105] 96 (05/09 0844) ?Resp:  [18-25] 25 (05/09 0844) ?BP: (111-134)/(80-91) 123/88 (05/09 0801) ?SpO2:  [90 %-95 %] 94 % (05/09 0844) ?FiO2 (%):  [21 %] 21 % (05/09 0844) ?Last BM Date : 04/27/22 ? ?Intake/Output from previous day: ?05/08 0701 - 05/09 0700 ?In: 200  ?Out: 1650 [Urine:1650] ?Intake/Output this shift: ?No intake/output data recorded. ? ?PE: ?General: WD, WN male who is laying in bed  ?HEENT:pressure wound to posterior scalp well healing with healthy granulation tissue present ?Heart: regular, rate, and rhythm.  Palpable radial and pedal pulses bilaterally ?Lungs: CTAB, no wheezes, rhonchi, or rales noted.  Respiratory effort nonlabored. Trach with thick whitish secretions  ?Abd: soft, NT, ND, PEG in place ?MS: all 4 extremities are symmetrical with no cyanosis, clubbing, or edema. ?Skin: warm and dry with no masses, lesions, or rashes ?Neuro: intermittently followed commands, did not open eyes this AM ? ? ? ?Lab Results:  ?No results for input(s): WBC, HGB, HCT, PLT in the last 72 hours. ?BMET ?No results for input(s): NA, K, CL, CO2, GLUCOSE, BUN, CREATININE, CALCIUM in the last 72 hours. ?PT/INR ?No results for input(s): LABPROT, INR in the last 72 hours. ?CMP  ?   ?Component Value Date/Time  ? NA 143 04/10/2022 0300  ? K 3.8 04/10/2022 0300  ? CL 106 04/10/2022 0300  ? CO2 29 04/10/2022 0300  ? GLUCOSE 121 (H) 04/10/2022 0300  ? BUN 26 (H) 04/10/2022 0300  ? CREATININE 0.72 04/10/2022 0300  ? CALCIUM 9.7 04/10/2022 0300  ? PROT 7.8 03/10/2022 2117  ? ALBUMIN 4.8 03/10/2022 2117  ? AST 42 (H) 03/10/2022 2117  ? ALT 43 03/10/2022 2117  ? ALKPHOS 67  03/10/2022 2117  ? BILITOT 0.6 03/10/2022 2117  ? GFRNONAA >60 04/10/2022 0300  ? ?Lipase  ?No results found for: LIPASE ? ? ? ? ?Studies/Results: ?No results found. ? ?Anti-infectives: ?Anti-infectives (From admission, onward)  ? ? Start     Dose/Rate Route Frequency Ordered Stop  ? 04/18/22 1400  ceFEPIme (MAXIPIME) 2 g in sodium chloride 0.9 % 100 mL IVPB       ? 2 g ?200 mL/hr over 30 Minutes Intravenous Every 8 hours 04/18/22 1041 04/20/22 2205  ? 04/16/22 1545  cefTRIAXone (ROCEPHIN) 2 g in sodium chloride 0.9 % 100 mL IVPB  Status:  Discontinued       ? 2 g ?200 mL/hr over 30 Minutes Intravenous Every 24 hours 04/16/22 1449 04/18/22 1040  ? 04/14/22 2345  ceFEPIme (MAXIPIME) 2 g in sodium chloride 0.9 % 100 mL IVPB  Status:  Discontinued       ? 2 g ?200 mL/hr over 30 Minutes Intravenous Every 8 hours 04/14/22 2251 04/16/22 1448  ? 04/14/22 2345  metroNIDAZOLE (FLAGYL) IVPB 500 mg  Status:  Discontinued       ? 500 mg ?100 mL/hr over 60 Minutes Intravenous Every 12 hours 04/14/22 2251 04/15/22 1033  ? 04/14/22 2330  piperacillin-tazobactam (ZOSYN) IVPB 3.375 g  Status:  Discontinued       ? 3.375 g ?12.5 mL/hr over 240 Minutes Intravenous Every 8 hours 04/14/22 2242  04/14/22 2248  ? 03/14/22 1015  ceFEPIme (MAXIPIME) 2 g in sodium chloride 0.9 % 100 mL IVPB  Status:  Discontinued       ? 2 g ?200 mL/hr over 30 Minutes Intravenous Every 12 hours 03/14/22 0926 03/14/22 0934  ? 03/14/22 1000  ceFEPIme (MAXIPIME) 2 g in sodium chloride 0.9 % 100 mL IVPB  Status:  Discontinued       ? 2 g ?200 mL/hr over 30 Minutes Intravenous Every 8 hours 03/14/22 0935 03/16/22 1017  ? 03/11/22 0915  clindamycin (CLEOCIN) 75 MG/5ML solution 150 mg       ? 150 mg Per Tube Every 8 hours 03/11/22 0823 03/15/22 2131  ? 03/10/22 2215  ceFAZolin (ANCEF) IVPB 2g/100 mL premix       ? 2 g ?200 mL/hr over 30 Minutes Intravenous  Once 03/10/22 2202 03/10/22 2307  ? ?  ? ? ? 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 Jordan Likes, keppra x7d for sz ppx, TBI therapies once extubated. Repeat CT H 3/23 stable. MRI brain 3/27 with microhemorrhages in cortex, corpaus callosum, basal ganglia, midbrain, pons. Dr. Riley Kill with PM&R added amantadine 3/31, increased to 200mg  BID 4/3. Exam is improving some. Seroquel discontinued 4/20. DC oxycodone and use tylenol or toradol prn for pain if needed. Repeat CT 4/25 due to decrease in level of alertness showing resolution of hemorrhage and no acute hemorrhage. Mild increase in ventricles concerning for hydrocephalus - NSGY reviewed without concern for worsening hydrocephalus and no new reccs. Ritalin added per rehab.  ?Questionable sz activity 5/7 - not c/w sz, more like head bobbing/tremor. Keppra restarted, neuro saw yesterday and question rigors. Pt is afebrile but will check CBC this AM  ?C1 TP fx, occipital condyle fx - per Dr. 7/7, [facility] J collar in place  - discussed with NSGY and removed 5/3 ?Acute ventilator dependent respiratory failure s/p trach- cont PSV trials, on guaifenisen for thick secretions, mucomyst, robinul. Changed to #4 cuffless trach 4/17. Chest physiotherapy. Secretions improved. Trach capped 5/3, uncapped per RT on 5/4, remains stable on trach collar. Thick secretions ?R ear laceration - S/P repair, completed Clinda x 5d per Dr. 7/4 ?Hyperglycemia - likely from TF, SSI ?Tongue biting - Bite block in room, continue to use as able. Discussed with OMFS 4/14 and only other recs at this point are for MMF with or without an integrated bite block. 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 Continue to monitor, keep silver nitrate applicators at bedside. Improved ?Pneumonia - Resolved.  ? ?FEN - NPO, TF 80 ml/h per PEG, SLP ?R post tib and peroneal DVT - therapeutic LMWH transitioned to eliquis 4/28 ?ID - none ?Foley - out. External cath ? ?Dispo -  PT/OT/SLP. SNF vs CIR pending progress. CBC this AM ? LOS: 49 days  ? ? ?5/28, PA-C ?[facility] ?04/28/2022, 10:33 AM ?Please see Amion for pager number during day hours 7:00am-4:30pm ? ?

## 2022-04-28 NOTE — TOC Progression Note (Signed)
Transition of Care (TOC) - Progression Note  ? ? ?Patient Details  ?Name: Carl Carpenter ?MRN: [id number] ?Date of Birth: 08-17-1986 ? ?Transition of Care (TOC) CM/SW Contact  ?Leahmarie Gasiorowski Aris Lot, LCSW ?Phone Number: ?04/28/2022, 11:10 AM ? ?Clinical Narrative:  CSW sent SNF referral in hub to [facility] for regional VP of sales to review and identify if they have any trach SNF's available in other parts of their region. CSW notified Crystal and she confirmed receiving the referral and will notify regional VP.  ? ?Expected Discharge Plan: Skilled Nursing Facility ?Barriers to Discharge: Continued Medical Work up ? ?Expected Discharge Plan and Services ?Expected Discharge Plan: Skilled Nursing Facility ?  ?Discharge Planning Services: CM Consult ?  ?Living arrangements for the past 2 months: Single Family Home ?                ?  ?  ?  ?  ?  ?  ?  ?  ?  ?  ? ? ?Social Determinants of Health (SDOH) Interventions ?  ? ?Readmission Risk Interventions ?   ? View : No data to display.  ?  ?  ?  ? ? ?

## 2022-04-29 LAB — GLUCOSE, CAPILLARY
Glucose-Capillary: 115 mg/dL — ABNORMAL HIGH (ref 70–99)
Glucose-Capillary: 135 mg/dL — ABNORMAL HIGH (ref 70–99)
Glucose-Capillary: 127 mg/dL — ABNORMAL HIGH (ref 70–99)
Glucose-Capillary: 100 mg/dL — ABNORMAL HIGH (ref 70–99)
Glucose-Capillary: 116 mg/dL — ABNORMAL HIGH (ref 70–99)
Glucose-Capillary: 124 mg/dL — ABNORMAL HIGH (ref 70–99)
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
## 2022-04-29 NOTE — Progress Notes (Signed)
Patient seen today by trach team for consult.  No education is needed at this time.  All necessary equipment is at beside.   Will continue to follow for progression.  

## 2022-04-30 LAB — GLUCOSE, CAPILLARY
Glucose-Capillary: 114 mg/dL — ABNORMAL HIGH (ref 70–99)
Glucose-Capillary: 115 mg/dL — ABNORMAL HIGH (ref 70–99)
Glucose-Capillary: 131 mg/dL — ABNORMAL HIGH (ref 70–99)
Glucose-Capillary: 127 mg/dL — ABNORMAL HIGH (ref 70–99)
Glucose-Capillary: 113 mg/dL — ABNORMAL HIGH (ref 70–99)

## 2022-04-30 NOTE — TOC Progression Note (Addendum)
Transition of Care (TOC) - Progression Note  ? ? ?Patient Details  ?Name: Carl Carpenter ?MRN: [id number] ?Date of Birth: June 13, 1986 ? ?Transition of Care (TOC) CM/SW Contact  ?Ella Bodo, RN ?Phone Number: ?04/30/2022, 4:38 PM ? ?Clinical Narrative:    ?[facility] has declined for admission. Patient reviewed in [place].  Left message for [facility] in [place], requesting to send referral per [facility] supervisor's recommendation.  Await call back. ? ? ?Expected Discharge Plan: [place] ?Barriers to Discharge: Continued Medical Work up ? ?Expected Discharge Plan and Services ?Expected Discharge Plan: [place] ?  ?Discharge Planning Services: CM Consult ?  ?Living arrangements for the past 2 months: [place] ?                ?  ?  ?  ?  ?  ?  ?  ?  ?  ?  ? ? ?Social Determinants of Health (SDOH) Interventions ?  ? ?Readmission Risk Interventions ?   ? View : No data to display.  ?  ?  ?  ? ?Reinaldo Raddle, RN, BSN  ?Trauma/Neuro ICU Case Manager ?[phone] ? ?

## 2022-04-30 NOTE — Progress Notes (Signed)
Called by the CNA that [place] like the trach is out. When checked, trach was out. Janina Mayo was replaced and RT was called.No change of LOC.  ?
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
## 2022-04-30 NOTE — Procedures (Signed)
Tracheostomy Change Note ? ?Patient Details:   ?Name: Carl Carpenter ?DOB: [birth year] ?MRN: [id number] ?   ?Airway Documentation: ?   ? ?Evaluation ? O2 sats: stable throughout ?Complications: No apparent complications ?Patient did tolerate procedure well. ?Bilateral Breath Sounds: Rhonchi, Diminished ? ?RT called by RN due to patient's trach dislodging while pt being turned. RT immediately came to pt room. Upon arrival, RT was informed that RN opened new spare trach at bedside and inserted new trach. RT placed ETCO2 with positive color change. BBS heard and O2 remained in high 90's during event. Vitals are stable. RT will continue to monitor.  ?  ? ?[name] ?04/30/2022, 4:44 PM ?

## 2022-04-30 NOTE — Plan of Care (Signed)
?  Problem: Clinical Measurements: ?Goal: Ability to maintain clinical measurements within normal limits will improve ?Outcome: Progressing ?Goal: Will remain free from infection ?Outcome: Progressing ?Goal: Diagnostic test results will improve ?Outcome: Progressing ?Goal: Respiratory complications will improve ?Outcome: Progressing ?Goal: Cardiovascular complication will be avoided ?Outcome: Progressing ?  ?Problem: Activity: ?Goal: Risk for activity intolerance will decrease ?Outcome: Progressing ?  ?Problem: Nutrition: ?Goal: Adequate nutrition will be maintained ?Outcome: Progressing ?  ?Problem: Coping: ?Goal: Level of anxiety will decrease ?Outcome: Completed/Met ?  ?Problem: Elimination: ?Goal: Will not experience complications related to bowel motility ?Outcome: Progressing ?Goal: Will not experience complications related to urinary retention ?Outcome: Progressing ?  ?Problem: Elimination: ?Goal: Will not experience complications related to urinary retention ?Outcome: Progressing ?  ?

## 2022-05-01 LAB — GLUCOSE, CAPILLARY
Glucose-Capillary: 116 mg/dL — ABNORMAL HIGH (ref 70–99)
Glucose-Capillary: 121 mg/dL — ABNORMAL HIGH (ref 70–99)
Glucose-Capillary: 125 mg/dL — ABNORMAL HIGH (ref 70–99)
Glucose-Capillary: 126 mg/dL — ABNORMAL HIGH (ref 70–99)
Glucose-Capillary: 128 mg/dL — ABNORMAL HIGH (ref 70–99)
Glucose-Capillary: 131 mg/dL — ABNORMAL HIGH (ref 70–99)
Glucose-Capillary: 141 mg/dL — ABNORMAL HIGH (ref 70–99)
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
## 2022-05-01 NOTE — Progress Notes (Signed)
RT received order to cap patient's trach at this time. Patient still has thick, copious secretions, which has required tracheal suction. RT does not think it would be appropriate to cap patient's trach at this time. PA, who placed order, notified of this information. ?
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
## 2022-05-02 LAB — GLUCOSE, CAPILLARY
Glucose-Capillary: 111 mg/dL — ABNORMAL HIGH (ref 70–99)
Glucose-Capillary: 125 mg/dL — ABNORMAL HIGH (ref 70–99)
Glucose-Capillary: 126 mg/dL — ABNORMAL HIGH (ref 70–99)
Glucose-Capillary: 128 mg/dL — ABNORMAL HIGH (ref 70–99)
Glucose-Capillary: 134 mg/dL — ABNORMAL HIGH (ref 70–99)
Glucose-Capillary: 139 mg/dL — ABNORMAL HIGH (ref 70–99)

## 2022-05-02 MED ORDER — AMANTADINE HCL 50 MG/5ML PO SOLN
200.0000 mg | Freq: Two times a day (BID) | ORAL | Status: DC
  Administered 2022-05-03 – 2022-05-08 (×12): 200 mg
  Filled 2022-05-02 (×13): qty 20

## 2022-05-02 MED ORDER — METHYLPHENIDATE HCL 5 MG PO TABS
5.0000 mg | ORAL_TABLET | Freq: Two times a day (BID) | ORAL | Status: DC
  Administered 2022-05-03 – 2022-05-08 (×12): 5 mg
  Filled 2022-05-02 (×12): qty 1
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
## 2022-05-03 LAB — GLUCOSE, CAPILLARY
Glucose-Capillary: 113 mg/dL — ABNORMAL HIGH (ref 70–99)
Glucose-Capillary: 121 mg/dL — ABNORMAL HIGH (ref 70–99)
Glucose-Capillary: 127 mg/dL — ABNORMAL HIGH (ref 70–99)
Glucose-Capillary: 127 mg/dL — ABNORMAL HIGH (ref 70–99)
Glucose-Capillary: 128 mg/dL — ABNORMAL HIGH (ref 70–99)
Glucose-Capillary: 147 mg/dL — ABNORMAL HIGH (ref 70–99)
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
## 2022-05-03 NOTE — Plan of Care (Signed)
  Problem: Pain Managment: Goal: General experience of comfort will improve Outcome: Progressing   Problem: Safety: Goal: Ability to remain free from injury will improve Outcome: Progressing   Problem: Skin Integrity: Goal: Risk for impaired skin integrity will decrease Outcome: Progressing   

## 2022-05-04 LAB — CBC
HCT: 39.9 % (ref 39.0–52.0)
Hemoglobin: 13.5 g/dL (ref 13.0–17.0)
MCH: 31.8 pg (ref 26.0–34.0)
MCHC: 33.8 g/dL (ref 30.0–36.0)
MCV: 93.9 fL (ref 80.0–100.0)
Platelets: 141 10*3/uL — ABNORMAL LOW (ref 150–400)
RBC: 4.25 MIL/uL (ref 4.22–5.81)
RDW: 13.1 % (ref 11.5–15.5)
WBC: 9.9 10*3/uL (ref 4.0–10.5)
nRBC: 0 % (ref 0.0–0.2)

## 2022-05-04 LAB — GLUCOSE, CAPILLARY
Glucose-Capillary: 131 mg/dL — ABNORMAL HIGH (ref 70–99)
Glucose-Capillary: 142 mg/dL — ABNORMAL HIGH (ref 70–99)
Glucose-Capillary: 133 mg/dL — ABNORMAL HIGH (ref 70–99)
Glucose-Capillary: 111 mg/dL — ABNORMAL HIGH (ref 70–99)
Glucose-Capillary: 113 mg/dL — ABNORMAL HIGH (ref 70–99)
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
## 2022-05-04 NOTE — Progress Notes (Signed)
47 Days Post-Op  Subjective: CC: NAEO Father at bedside Tolerating TFs  Reports less secretions Last BM 5/12  Objective: Vital signs in last 24 hours: Temp:  [98.2 F (36.8 C)-98.6 F (37 C)] 98.4 F (36.9 C) (05/15 0750) Pulse Rate:  [88-94] 93 (05/15 0942) Resp:  [16-21] 18 (05/15 0942) BP: (101-126)/(66-83) 116/76 (05/15 0750) SpO2:  [94 %-98 %] 98 % (05/15 0942) FiO2 (%):  [28 %] 28 % (05/15 0942) Last BM Date : 05/01/22  Intake/Output from previous day: 05/14 0701 - 05/15 0700 In: -  Out: 440 [Urine:440] Intake/Output this shift: Total I/O In: -  Out: 400 [Drains:400]  PE: General: WD, WN male who is laying in bed in NAD Heart: regular, rate, and rhythm.   Lungs: CTA b/l. On Trach Collar Abd: soft, NT, ND, PEG in place with TF running MS: all 4 extremities are symmetrical with no edema. Neuro: Didn't follow my commands but dad says he was doing well following commands with therapies earlier and for him  Lab Results:  Recent Labs    05/04/22 0657  WBC 9.9  HGB 13.5  HCT 39.9  PLT 141*   BMET No results for input(s): NA, K, CL, CO2, GLUCOSE, BUN, CREATININE, CALCIUM in the last 72 hours. PT/INR No results for input(s): LABPROT, INR in the last 72 hours. CMP     Component Value Date/Time   NA 143 04/10/2022 0300   K 3.8 04/10/2022 0300   CL 106 04/10/2022 0300   CO2 29 04/10/2022 0300   GLUCOSE 121 (H) 04/10/2022 0300   BUN 26 (H) 04/10/2022 0300   CREATININE 0.72 04/10/2022 0300   CALCIUM 9.7 04/10/2022 0300   PROT 7.8 03/10/2022 2117   ALBUMIN 4.8 03/10/2022 2117   AST 42 (H) 03/10/2022 2117   ALT 43 03/10/2022 2117   ALKPHOS 67 03/10/2022 2117   BILITOT 0.6 03/10/2022 2117   GFRNONAA >60 04/10/2022 0300   Lipase  No results found for: LIPASE  Studies/Results: No results found.  Anti-infectives: Anti-infectives (From admission, onward)    Start     Dose/Rate Route Frequency Ordered Stop   04/18/22 1400  ceFEPIme (MAXIPIME) 2 g  in sodium chloride 0.9 % 100 mL IVPB        2 g 200 mL/hr over 30 Minutes Intravenous Every 8 hours 04/18/22 1041 04/20/22 2205   04/16/22 1545  cefTRIAXone (ROCEPHIN) 2 g in sodium chloride 0.9 % 100 mL IVPB  Status:  Discontinued        2 g 200 mL/hr over 30 Minutes Intravenous Every 24 hours 04/16/22 1449 04/18/22 1040   04/14/22 2345  ceFEPIme (MAXIPIME) 2 g in sodium chloride 0.9 % 100 mL IVPB  Status:  Discontinued        2 g 200 mL/hr over 30 Minutes Intravenous Every 8 hours 04/14/22 2251 04/16/22 1448   04/14/22 2345  metroNIDAZOLE (FLAGYL) IVPB 500 mg  Status:  Discontinued        500 mg 100 mL/hr over 60 Minutes Intravenous Every 12 hours 04/14/22 2251 04/15/22 1033   04/14/22 2330  piperacillin-tazobactam (ZOSYN) IVPB 3.375 g  Status:  Discontinued        3.375 g 12.5 mL/hr over 240 Minutes Intravenous Every 8 hours 04/14/22 2242 04/14/22 2248   03/14/22 1015  ceFEPIme (MAXIPIME) 2 g in sodium chloride 0.9 % 100 mL IVPB  Status:  Discontinued        2 g 200 mL/hr over 30 Minutes Intravenous  Every 12 hours 03/14/22 0926 03/14/22 0934   03/14/22 1000  ceFEPIme (MAXIPIME) 2 g in sodium chloride 0.9 % 100 mL IVPB  Status:  Discontinued        2 g 200 mL/hr over 30 Minutes Intravenous Every 8 hours 03/14/22 0935 03/16/22 1017   03/11/22 0915  clindamycin (CLEOCIN) 75 MG/5ML solution 150 mg        150 mg Per Tube Every 8 hours 03/11/22 0823 03/15/22 2131   03/10/22 2215  ceFAZolin (ANCEF) IVPB 2g/100 mL premix        2 g 200 mL/hr over 30 Minutes Intravenous  Once 03/10/22 2202 03/10/22 2307        Assessment/Plan [facility] 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 Jordan Likes, keppra x7d for sz ppx, TBI therapies once extubated. Repeat CT H 3/23 stable. MRI brain 3/27 with microhemorrhages in cortex, corpaus callosum, basal ganglia, midbrain, pons. Dr. Riley Kill with PM&R added amantadine 3/31, increased to 200mg  BID 4/3. Exam is improving some. Seroquel discontinued 4/20. DC oxycodone  and use tylenol or toradol prn for pain if needed. Repeat CT 4/25 due to decrease in level of alertness showing resolution of hemorrhage and no acute hemorrhage. Mild increase in ventricles concerning for hydrocephalus - NSGY reviewed without concern for worsening hydrocephalus and no new reccs. Ritalin added per rehab.  Questionable sz activity 5/7 - not c/w sz, more like head bobbing/tremor. Neuro saw and questioned rigors. Pt is afebrile and no leukocytosis. More likely sequela of head injury, Dr. 7/7 has talked to family C1 TP fx, occipital condyle fx - per Dr. Jordan Likes, [facility] J collar in place  - discussed with NSGY and removed 5/3 Acute ventilator dependent respiratory failure s/p trach- cont PSV trials, on guaifenisen for thick secretions, mucomyst, robinul. Changed to #4 cuffless trach 4/17. Chest physiotherapy. Secretions improved. Trach capped 5/3, uncapped per RT on 5/4, remains stable on trach collar. Less secretions, discussed with attending. Will cap R ear laceration - S/P repair, completed Clinda x 5d per Dr. 7/4 Hyperglycemia - likely from TF, SSI Tongue biting - Bite block in room, continue to use as needed. Discussed with OMFS 4/14 and only other recs at this point are for MMF with or without an integrated bite block. 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 Continue to monitor, keep silver nitrate applicators at bedside. Improved Pneumonia - Resolved.    FEN - NPO, TF 80 ml/h per PEG, SLP R post tib and peroneal DVT - therapeutic LMWH transitioned to eliquis 4/28 ID - none Foley - out. External cath   Dispo -  PT/OT/SLP. SNF vs CIR pending progress. Medically stable for discharge once dispo arranged.   LOS: 55 days    5/28 , [facility] 05/04/2022, 2:27 PM Please see Amion for pager number during day hours 7:00am-4:30pm
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
## 2022-05-05 LAB — GLUCOSE, CAPILLARY
Glucose-Capillary: 151 mg/dL — ABNORMAL HIGH (ref 70–99)
Glucose-Capillary: 133 mg/dL — ABNORMAL HIGH (ref 70–99)
Glucose-Capillary: 143 mg/dL — ABNORMAL HIGH (ref 70–99)
Glucose-Capillary: 127 mg/dL — ABNORMAL HIGH (ref 70–99)
Glucose-Capillary: 116 mg/dL — ABNORMAL HIGH (ref 70–99)
Glucose-Capillary: 116 mg/dL — ABNORMAL HIGH (ref 70–99)

## 2022-05-05 MED ORDER — POLYETHYLENE GLYCOL 3350 17 G PO PACK
17.0000 g | PACK | Freq: Every day | ORAL | Status: DC
  Administered 2022-05-05 – 2022-05-08 (×3): 17 g
  Filled 2022-05-05 (×3): qty 1

## 2022-05-05 NOTE — Progress Notes (Signed)
Patient ID: Carl Carpenter, male   DOB: [birth year], 36 y.o.   MRN: [id number] ?[facility] ?Progress Note ? ?48 Days Post-Op  ?Subjective: ?CC-  ?Mother at bedside.  ?Tolerating tube feedings. ?States that he has been successful at coughing up mucus this morning. ?States that he took 2 sips with therapies yesterday and adjusted cap on his head. ? ?Objective: ?Vital signs in last 24 hours: ?Temp:  [98.3 ?F (36.8 ?C)-99.2 ?F (37.3 ?C)] 98.3 ?F (36.8 ?C) (05/16 0741) ?Pulse Rate:  [83-110] 85 (05/16 0741) ?Resp:  [17-20] 18 (05/16 0741) ?BP: (107-121)/(73-84) 112/73 (05/16 0741) ?SpO2:  [93 %-98 %] 94 % (05/16 0832) ?FiO2 (%):  [28 %] 28 % (05/16 0832) ?Last BM Date : 05/03/22 ? ?Intake/Output from previous day: ?05/15 0701 - 05/16 0700 ?In: 880 [NG/GT:880] ?Out: 1400 [Urine:1000; Drains:400] ?Intake/Output this shift: ?No intake/output data recorded. ? ?PE: ?General: WD, WN male who is laying in bed in NAD ?Heart: regular, rate, and rhythm.   ?Lungs: CTA b/l. On Trach Collar ?Abd: soft, NT, ND, PEG in place with TF running ?MS: all 4 extremities are symmetrical with no edema. ?Neuro: tracked me with his eyes, followed some commands with LUE and LLE  ? ?Lab Results:  ?Recent Labs  ?  05/04/22 ?0657  ?WBC 9.9  ?HGB 13.5  ?HCT 39.9  ?PLT 141*  ? ?BMET ?No results for input(s): NA, K, CL, CO2, GLUCOSE, BUN, CREATININE, CALCIUM in the last 72 hours. ?PT/INR ?No results for input(s): LABPROT, INR in the last 72 hours. ?CMP  ?   ?Component Value Date/Time  ? NA 143 04/10/2022 0300  ? K 3.8 04/10/2022 0300  ? CL 106 04/10/2022 0300  ? CO2 29 04/10/2022 0300  ? GLUCOSE 121 (H) 04/10/2022 0300  ? BUN 26 (H) 04/10/2022 0300  ? CREATININE 0.72 04/10/2022 0300  ? CALCIUM 9.7 04/10/2022 0300  ? PROT 7.8 03/10/2022 2117  ? ALBUMIN 4.8 03/10/2022 2117  ? AST 42 (H) 03/10/2022 2117  ? ALT 43 03/10/2022 2117  ? ALKPHOS 67 03/10/2022 2117  ? BILITOT 0.6 03/10/2022 2117  ? GFRNONAA >60 04/10/2022 0300  ? ?Lipase  ?No results  found for: LIPASE ? ? ? ? ?Studies/Results: ?No results found. ? ?Anti-infectives: ?Anti-infectives (From admission, onward)  ? ? Start     Dose/Rate Route Frequency Ordered Stop  ? 04/18/22 1400  ceFEPIme (MAXIPIME) 2 g in sodium chloride 0.9 % 100 mL IVPB       ? 2 g ?200 mL/hr over 30 Minutes Intravenous Every 8 hours 04/18/22 1041 04/20/22 2205  ? 04/16/22 1545  cefTRIAXone (ROCEPHIN) 2 g in sodium chloride 0.9 % 100 mL IVPB  Status:  Discontinued       ? 2 g ?200 mL/hr over 30 Minutes Intravenous Every 24 hours 04/16/22 1449 04/18/22 1040  ? 04/14/22 2345  ceFEPIme (MAXIPIME) 2 g in sodium chloride 0.9 % 100 mL IVPB  Status:  Discontinued       ? 2 g ?200 mL/hr over 30 Minutes Intravenous Every 8 hours 04/14/22 2251 04/16/22 1448  ? 04/14/22 2345  metroNIDAZOLE (FLAGYL) IVPB 500 mg  Status:  Discontinued       ? 500 mg ?100 mL/hr over 60 Minutes Intravenous Every 12 hours 04/14/22 2251 04/15/22 1033  ? 04/14/22 2330  piperacillin-tazobactam (ZOSYN) IVPB 3.375 g  Status:  Discontinued       ? 3.375 g ?12.5 mL/hr over 240 Minutes Intravenous Every 8 hours 04/14/22 2242 04/14/22 2248  ?  03/14/22 1015  ceFEPIme (MAXIPIME) 2 g in sodium chloride 0.9 % 100 mL IVPB  Status:  Discontinued       ? 2 g ?200 mL/hr over 30 Minutes Intravenous Every 12 hours 03/14/22 0926 03/14/22 0934  ? 03/14/22 1000  ceFEPIme (MAXIPIME) 2 g in sodium chloride 0.9 % 100 mL IVPB  Status:  Discontinued       ? 2 g ?200 mL/hr over 30 Minutes Intravenous Every 8 hours 03/14/22 0935 03/16/22 1017  ? 03/11/22 0915  clindamycin (CLEOCIN) 75 MG/5ML solution 150 mg       ? 150 mg Per Tube Every 8 hours 03/11/22 0823 03/15/22 2131  ? 03/10/22 2215  ceFAZolin (ANCEF) IVPB 2g/100 mL premix       ? 2 g ?200 mL/hr over 30 Minutes Intravenous  Once 03/10/22 2202 03/10/22 2307  ? ?  ? ? ? 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 Jordan Likes, keppra x7d for sz ppx, TBI therapies once extubated. Repeat CT H 3/23 stable. MRI brain  3/27 with microhemorrhages in cortex, corpaus callosum, basal ganglia, midbrain, pons. Dr. Riley Kill with PM&R added amantadine 3/31, increased to 200mg  BID 4/3. Exam is improving some. Seroquel discontinued 4/20. DC oxycodone and use tylenol or toradol prn for pain if needed. Repeat CT 4/25 due to decrease in level of alertness showing resolution of hemorrhage and no acute hemorrhage. Mild increase in ventricles concerning for hydrocephalus - NSGY reviewed without concern for worsening hydrocephalus and no new reccs. Ritalin added per rehab.  ?Questionable sz activity 5/7 - not c/w sz, more like head bobbing/tremor. Neuro saw and questioned rigors. Pt is afebrile and no leukocytosis. More likely sequela of head injury, Dr. 7/7 has talked to family ?C1 TP fx, occipital condyle fx - per Dr. Jordan Likes, [facility] J collar in place  - discussed with NSGY and removed 5/3 ?Acute ventilator dependent respiratory failure s/p trach- cont PSV trials, on guaifenisen for thick secretions, mucomyst, robinul. Changed to #4 cuffless trach 4/17. Chest physiotherapy. Secretions improved. Trach capped 5/3, uncapped per RT on 5/4, remains stable on trach collar. Asked RT to cap today 5/16 ?R ear laceration - S/P repair, completed Clinda x 5d per Dr. 6/16 ?Hyperglycemia - likely from TF, SSI ?Tongue biting - Bite block in room, continue to use as needed. Discussed with OMFS 4/14 and only other recs at this point are for MMF with or without an integrated bite block. 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 Continue to monitor, keep silver nitrate applicators at bedside. Improved ?Pneumonia - Resolved.  ?  ?FEN - NPO, TF 80 ml/h per PEG, SLP ?R post tib and peroneal DVT - therapeutic LMWH transitioned to eliquis 4/28 ?ID - none ?Foley - out. External cath ?  ?Dispo -  PT/OT/SLP. SNF vs CIR pending progress. Medically stable for discharge once dispo arranged. ? ?I reviewed last 24 h vitals and pain scores, last 48 h intake and  output, last 24 h therapy notes. ? ? ? LOS: 56 days  ? ? ?5/28, PA-C ?[facility] ?05/05/2022, 8:58 AM ?Please see [place] for pager number during day hours 7:00am-4:30pm ? ?
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
## 2022-05-05 NOTE — Progress Notes (Signed)
Pt currently capped at this time per PA. Pt tolerating well at this time, vitals stable. RT will continue to monitor.  ? ? ? 05/05/22 1150  ?Therapy Vitals  ?Pulse Rate 95  ?Resp 18  ?Patient Position (if appropriate) Lying  ?MEWS Score/Color  ?MEWS Score 0  ?MEWS Score Color Green  ?Respiratory Assessment  ?Assessment Type Assess only  ?Respiratory Pattern Regular;Unlabored  ?Chest Assessment Chest expansion symmetrical  ?Cough Productive  ?Sputum Amount Small  ?Sputum Color White;Tan  ?Sputum Consistency Thick  ?Sputum Specimen Source Tracheostomy tube  ?Bilateral Breath Sounds Rhonchi;Diminished  ?Oxygen Therapy/Pulse Ox  ?O2 Device (S)  Nasal Cannula  ?O2 Therapy Oxygen  ?O2 Flow Rate (L/min) 2 L/min  ?FiO2 (%) 28 %  ?SpO2 94 %  ?Tracheostomy Shiley Flexible 4 mm Uncuffed  ?Placement Date/Time: 04/30/22 1619   Placed By: (c) Other (Comment)  Brand: Shiley Flexible  Size (mm): 4 mm  Style: Uncuffed  ?Status (S)  Capped  ?Site Assessment Dry;Clean  ?Ties Assessment Clean, Dry, Secure  ?Tracheostomy Equipment at bedside Yes and checklist posted at head of bed  ? ? ?
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
## 2022-05-05 NOTE — Plan of Care (Signed)
  Problem: Clinical Measurements: Goal: Diagnostic test results will improve Outcome: Progressing Goal: Cardiovascular complication will be avoided Outcome: Progressing   
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
## 2022-05-06 LAB — GLUCOSE, CAPILLARY
Glucose-Capillary: 105 mg/dL — ABNORMAL HIGH (ref 70–99)
Glucose-Capillary: 107 mg/dL — ABNORMAL HIGH (ref 70–99)
Glucose-Capillary: 117 mg/dL — ABNORMAL HIGH (ref 70–99)
Glucose-Capillary: 124 mg/dL — ABNORMAL HIGH (ref 70–99)
Glucose-Capillary: 125 mg/dL — ABNORMAL HIGH (ref 70–99)
Glucose-Capillary: 143 mg/dL — ABNORMAL HIGH (ref 70–99)
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
## 2022-05-06 NOTE — TOC Progression Note (Signed)
Transition of Care (TOC) - Progression Note  ? ? ?Patient Details  ?Name: Carl Carpenter ?MRN: [id number] ?Date of Birth: Feb 06, 1986 ? ?Transition of Care (TOC) CM/SW Contact  ?Ella Bodo, RN ?Phone Number: ?05/06/2022,3:30pm  ? ?Clinical Narrative:    ?Met with patient's father and significant other to discuss discharge planning: Patient now a Ranchos level 4, per PT/OT assessment.  Patient and significant other are very pleased with patient's progress, and hopeful for CIR soon.  I have spoken with Leretha Dykes for inpatient rehab; she plans to meet with family tomorrow to discuss rehab and needed support at discharge.  Father states patient likely to discharge to his home with significant other, and family will rally together to provide care.  Will continue to follow patient progress and steps towards rehab. ? ? ?Expected Discharge Plan: [place] ?Barriers to Discharge: Continued Medical Work up ? ?Expected Discharge Plan and Services ?Expected Discharge Plan: [place] ?  ?Discharge Planning Services: CM Consult ?  ?Living arrangements for the past 2 months: [place] ?                ?  ?  ?  ?  ?  ?  ?  ?  ?  ?  ? ? ?Social Determinants of Health (SDOH) Interventions ?  ? ?Readmission Risk Interventions ?   ? View : No data to display.  ?  ?  ?  ? ?Reinaldo Raddle, RN, BSN  ?Trauma/Neuro ICU Case Manager ?[phone] ? ?

## 2022-05-06 NOTE — Progress Notes (Signed)
Patient ID: Carl Carpenter, male   DOB: 1986-04-07, 36 y.o.   MRN: [id number] ?49 Days Post-Op  ?  ?Subjective: ?Awake, non-verbal ?ROS negative except as listed above. ?Objective: ?Vital signs in last 24 hours: ?Temp:  [98.4 ?F (36.9 ?C)-99.2 ?F (37.3 ?C)] 98.4 ?F (36.9 ?C) (05/17 0446) ?Pulse Rate:  [79-105] 79 (05/17 0915) ?Resp:  [16-18] 18 (05/17 0915) ?BP: (119-129)/(79-90) 119/80 (05/17 0724) ?SpO2:  [94 %-97 %] 96 % (05/17 0915) ?FiO2 (%):  [28 %] 28 % (05/16 1500) ?Weight:  [250 kg] 108 kg (05/16 1530) ?Last BM Date : 05/03/22 ? ?Intake/Output from previous day: ?05/16 0701 - 05/17 0700 ?In: -  ?Out: 200 [Urine:200] ?Intake/Output this shift: ?No intake/output data recorded. ? ?General appearance: no distress ?Resp: clear to auscultation bilaterally ?GI: soft, PEG OK ?Neurologic: Mental status: awake, looks to voice, follows some commands ? ?Lab Results: ?CBC  ?Recent Labs  ?  05/04/22 ?0657  ?WBC 9.9  ?HGB 13.5  ?HCT 39.9  ?PLT 141*  ? ?BMET ?No results for input(s): NA, K, CL, CO2, GLUCOSE, BUN, CREATININE, CALCIUM in the last 72 hours. ?PT/INR ?No results for input(s): LABPROT, INR in the last 72 hours. ?ABG ?No results for input(s): PHART, HCO3 in the last 72 hours. ? ?Invalid input(s): PCO2, PO2 ? ?Studies/Results: ?No results found. ? ?Anti-infectives: ?Anti-infectives (From admission, onward)  ? ? Start     Dose/Rate Route Frequency Ordered Stop  ? 04/18/22 1400  ceFEPIme (MAXIPIME) 2 g in sodium chloride 0.9 % 100 mL IVPB       ? 2 g ?200 mL/hr over 30 Minutes Intravenous Every 8 hours 04/18/22 1041 04/20/22 2205  ? 04/16/22 1545  cefTRIAXone (ROCEPHIN) 2 g in sodium chloride 0.9 % 100 mL IVPB  Status:  Discontinued       ? 2 g ?200 mL/hr over 30 Minutes Intravenous Every 24 hours 04/16/22 1449 04/18/22 1040  ? 04/14/22 2345  ceFEPIme (MAXIPIME) 2 g in sodium chloride 0.9 % 100 mL IVPB  Status:  Discontinued       ? 2 g ?200 mL/hr over 30 Minutes Intravenous Every 8 hours 04/14/22 2251 04/16/22 1448   ? 04/14/22 2345  metroNIDAZOLE (FLAGYL) IVPB 500 mg  Status:  Discontinued       ? 500 mg ?100 mL/hr over 60 Minutes Intravenous Every 12 hours 04/14/22 2251 04/15/22 1033  ? 04/14/22 2330  piperacillin-tazobactam (ZOSYN) IVPB 3.375 g  Status:  Discontinued       ? 3.375 g ?12.5 mL/hr over 240 Minutes Intravenous Every 8 hours 04/14/22 2242 04/14/22 2248  ? 03/14/22 1015  ceFEPIme (MAXIPIME) 2 g in sodium chloride 0.9 % 100 mL IVPB  Status:  Discontinued       ? 2 g ?200 mL/hr over 30 Minutes Intravenous Every 12 hours 03/14/22 0926 03/14/22 0934  ? 03/14/22 1000  ceFEPIme (MAXIPIME) 2 g in sodium chloride 0.9 % 100 mL IVPB  Status:  Discontinued       ? 2 g ?200 mL/hr over 30 Minutes Intravenous Every 8 hours 03/14/22 0935 03/16/22 1017  ? 03/11/22 0915  clindamycin (CLEOCIN) 75 MG/5ML solution 150 mg       ? 150 mg Per Tube Every 8 hours 03/11/22 0823 03/15/22 2131  ? 03/10/22 2215  ceFAZolin (ANCEF) IVPB 2g/100 mL premix       ? 2 g ?200 mL/hr over 30 Minutes Intravenous  Once 03/10/22 2202 03/10/22 2307  ? ?  ? ? ?Assessment/Plan: ?[facility] ?  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 Jordan Likes, keppra x7d for sz ppx, TBI therapies once extubated. Repeat CT H 3/23 stable. MRI brain 3/27 with microhemorrhages in cortex, corpaus callosum, basal ganglia, midbrain, pons. Dr. Riley Kill with PM&R added amantadine 3/31, increased to 200mg  BID 4/3. Exam is improving some. Seroquel discontinued 4/20. DC oxycodone and use tylenol or toradol prn for pain if needed. Repeat CT 4/25 due to decrease in level of alertness showing resolution of hemorrhage and no acute hemorrhage. Mild increase in ventricles concerning for hydrocephalus - NSGY reviewed without concern for worsening hydrocephalus and no new reccs. Ritalin added per rehab.  ?Questionable sz activity 5/7 - not c/w sz, more like head bobbing/tremor. Neuro saw and questioned rigors. Pt is afebrile and no leukocytosis. More likely sequela of head injury, Dr. 7/7 has talked  to family ?C1 TP fx, occipital condyle fx - per Dr. Jordan Likes, [facility] J collar in place  - discussed with NSGY and removed 5/3 ?Acute ventilator dependent respiratory failure s/p trach- cont PSV trials, on guaifenisen for thick secretions, mucomyst, robinul. Changed to #4 cuffless trach 4/17. Chest physiotherapy. Secretions improved. Trach capped again 5/16 ?R ear laceration - S/P repair, completed Clinda x 5d per Dr. 6/16 ?Hyperglycemia - likely from TF, SSI ?Tongue biting - Bite block in room, continue to use as needed. Discussed with OMFS 4/14 and only other recs at this point are for MMF with or without an integrated bite block. 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 Continue to monitor, keep silver nitrate applicators at bedside. Improved ?Pneumonia - Resolved.  ?  ?FEN - NPO, TF 80 ml/h per PEG, SLP ?R post tib and peroneal DVT - therapeutic LMWH transitioned to eliquis 4/28 ?ID - none ?Foley - out. External cath ?  ?Dispo -  PT/OT/SLP. SNF vs CIR pending progress. Medically stable. ?I spoke with his father at the bedside. ? ? LOS: 57 days  ? ? ?5/28, MD, MPH, FACS ?Trauma & General Surgery ?Use [email or web address] to contact on call provider ? ?05/06/2022  ?

## 2022-05-06 NOTE — Progress Notes (Signed)
Mobility Specialist Progress Note: ? ? 05/06/22 1412  ?Mobility  ?Activity Transferred from chair to bed  ?Level of Assistance +2 (takes two people)  ?Assistive Device MaxiMove  ?Activity Response Tolerated well  ?$Mobility charge 1 Mobility  ? ?Pt received in chair needing to go back to bed. Left in bed with call bell in reach and all needs met.  ? ?Price Lachapelle ?Mobility Specialist ?Primary Phone [phone] ? ?

## 2022-05-07 LAB — GLUCOSE, CAPILLARY
Glucose-Capillary: 110 mg/dL — ABNORMAL HIGH (ref 70–99)
Glucose-Capillary: 110 mg/dL — ABNORMAL HIGH (ref 70–99)
Glucose-Capillary: 114 mg/dL — ABNORMAL HIGH (ref 70–99)
Glucose-Capillary: 119 mg/dL — ABNORMAL HIGH (ref 70–99)
Glucose-Capillary: 120 mg/dL — ABNORMAL HIGH (ref 70–99)
Glucose-Capillary: 125 mg/dL — ABNORMAL HIGH (ref 70–99)
Glucose-Capillary: 125 mg/dL — ABNORMAL HIGH (ref 70–99)

## 2022-05-07 MED ORDER — GLYCOPYRROLATE 0.2 MG/ML IJ SOLN: 0.6000 mg | Freq: Three times a day (TID) | INTRAMUSCULAR | Status: DC

## 2022-05-07 MED ORDER — GLYCOPYRROLATE 0.2 MG/ML IJ SOLN
0.4000 mg | Freq: Four times a day (QID) | INTRAMUSCULAR | Status: DC
  Administered 2022-05-07 – 2022-05-08 (×5): 0.4 mg via INTRAVENOUS
  Filled 2022-05-07 (×6): qty 2

## 2022-05-07 NOTE — Progress Notes (Signed)
Patient ID: Carl Carpenter, male   DOB: [birth year], 36 y.o.   MRN: [id number] [facility] Surgery Progress Note  50 Days Post-Op  Subjective: CC-  Progressing well with therapies, now Rancho IV. Family meeting with CIR today. Trach capped 5/16, had to remove last night due to secretions and he coughed up thick mucus through trach. BM yesterday.  Objective: Vital signs in last 24 hours: Temp:  [98.2 F (36.8 C)-99 F (37.2 C)] 98.2 F (36.8 C) (05/18 0741) Pulse Rate:  [79-100] 84 (05/18 0741) Resp:  [16-20] 18 (05/18 0741) BP: (123-143)/(79-91) 123/80 (05/18 0741) SpO2:  [92 %-96 %] 94 % (05/18 0741) FiO2 (%):  [96 %] 96 % (05/17 1217) Weight:  [106.6 kg-108.4 kg] 106.6 kg (05/18 0038) Last BM Date : 05/05/22  Intake/Output from previous day: 05/17 0701 - 05/18 0700 In: -  Out: 1200 [Urine:1200] Intake/Output this shift: No intake/output data recorded.  PE: General: WD, WN male who is laying in bed in NAD Heart: RRR Lungs: CTA b/l. Trach capped Abd: soft, NT, ND, PEG in place with TF running MS: all 4 extremities are symmetrical with no edema. Neuro: tracked me with his eyes, followed some commands with LUE and LLE   Lab Results:  No results for input(s): WBC, HGB, HCT, PLT in the last 72 hours. BMET No results for input(s): NA, K, CL, CO2, GLUCOSE, BUN, CREATININE, CALCIUM in the last 72 hours. PT/INR No results for input(s): LABPROT, INR in the last 72 hours. CMP     Component Value Date/Time   NA 143 04/10/2022 0300   K 3.8 04/10/2022 0300   CL 106 04/10/2022 0300   CO2 29 04/10/2022 0300   GLUCOSE 121 (H) 04/10/2022 0300   BUN 26 (H) 04/10/2022 0300   CREATININE 0.72 04/10/2022 0300   CALCIUM 9.7 04/10/2022 0300   PROT 7.8 03/10/2022 2117   ALBUMIN 4.8 03/10/2022 2117   AST 42 (H) 03/10/2022 2117   ALT 43 03/10/2022 2117   ALKPHOS 67 03/10/2022 2117   BILITOT 0.6 03/10/2022 2117   GFRNONAA >60 04/10/2022 0300   Lipase  No results found for:  LIPASE     Studies/Results: No results found.  Anti-infectives: Anti-infectives (From admission, onward)    Start     Dose/Rate Route Frequency Ordered Stop   04/18/22 1400  ceFEPIme (MAXIPIME) 2 g in sodium chloride 0.9 % 100 mL IVPB        2 g 200 mL/hr over 30 Minutes Intravenous Every 8 hours 04/18/22 1041 04/20/22 2205   04/16/22 1545  cefTRIAXone (ROCEPHIN) 2 g in sodium chloride 0.9 % 100 mL IVPB  Status:  Discontinued        2 g 200 mL/hr over 30 Minutes Intravenous Every 24 hours 04/16/22 1449 04/18/22 1040   04/14/22 2345  ceFEPIme (MAXIPIME) 2 g in sodium chloride 0.9 % 100 mL IVPB  Status:  Discontinued        2 g 200 mL/hr over 30 Minutes Intravenous Every 8 hours 04/14/22 2251 04/16/22 1448   04/14/22 2345  metroNIDAZOLE (FLAGYL) IVPB 500 mg  Status:  Discontinued        500 mg 100 mL/hr over 60 Minutes Intravenous Every 12 hours 04/14/22 2251 04/15/22 1033   04/14/22 2330  piperacillin-tazobactam (ZOSYN) IVPB 3.375 g  Status:  Discontinued        3.375 g 12.5 mL/hr over 240 Minutes Intravenous Every 8 hours 04/14/22 2242 04/14/22 2248   03/14/22 1015  ceFEPIme (MAXIPIME)  2 g in sodium chloride 0.9 % 100 mL IVPB  Status:  Discontinued        2 g 200 mL/hr over 30 Minutes Intravenous Every 12 hours 03/14/22 0926 03/14/22 0934   03/14/22 1000  ceFEPIme (MAXIPIME) 2 g in sodium chloride 0.9 % 100 mL IVPB  Status:  Discontinued        2 g 200 mL/hr over 30 Minutes Intravenous Every 8 hours 03/14/22 0935 03/16/22 1017   03/11/22 0915  clindamycin (CLEOCIN) 75 MG/5ML solution 150 mg        150 mg Per Tube Every 8 hours 03/11/22 0823 03/15/22 2131   03/10/22 2215  ceFAZolin (ANCEF) IVPB 2g/100 mL premix        2 g 200 mL/hr over 30 Minutes Intravenous  Once 03/10/22 2202 03/10/22 2307        Assessment/Plan [facility] 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 Jordan Likes, keppra x7d for sz ppx, TBI therapies once extubated. Repeat CT H 3/23 stable. MRI brain 3/27 with  microhemorrhages in cortex, corpaus callosum, basal ganglia, midbrain, pons. Dr. Riley Kill with PM&R added amantadine 3/31, increased to 200mg  BID 4/3. Exam is improving some. Seroquel discontinued 4/20. DC oxycodone and use tylenol or toradol prn for pain if needed. Repeat CT 4/25 due to decrease in level of alertness showing resolution of hemorrhage and no acute hemorrhage. Mild increase in ventricles concerning for hydrocephalus - NSGY reviewed without concern for worsening hydrocephalus and no new reccs. Ritalin added per rehab.  Questionable sz activity 5/7 - not c/w sz, more like head bobbing/tremor. Neuro saw and questioned rigors. Pt is afebrile and no leukocytosis. More likely sequela of head injury, Dr. 7/7 has talked to family C1 TP fx, occipital condyle fx - per Dr. Jordan Likes, [facility] J collar in place  - discussed with NSGY and removed 5/3 Acute ventilator dependent respiratory failure s/p trach- cont PSV trials, on guaifenisen and robinul for thick secretions. Changed to #4 cuffless trach 4/17. Chest physiotherapy. Secretions improved. Trach capped again 5/16 but had to be uncapped 5/17 in PM - will discuss increasing meds for mucus with MD R ear laceration - S/P repair, completed Clinda x 5d per Dr. 6/17 Hyperglycemia - likely from TF, SSI Tongue biting - Bite block in room, continue to use as needed. Discussed with OMFS 4/14 and only other recs at this point are for MMF with or without an integrated bite block. 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 Continue to monitor, keep silver nitrate applicators at bedside. Improved Pneumonia - Resolved.    FEN - NPO, TF 80 ml/h per PEG, SLP R post tib and peroneal DVT - therapeutic LMWH transitioned to eliquis 4/28 ID - none Foley - out. External cath   Dispo -  PT/OT/SLP. SNF vs CIR pending progress. Medically stable. I spoke with his father at the bedside.   I reviewed last 24 h vitals and pain scores, last 48 h intake and  output, last 24 h therapy notes.    LOS: 58 days    5/28, [facility] 05/07/2022, 8:14 AM Please see Amion for pager number during day hours 7:00am-4:30pm

## 2022-05-07 NOTE — Progress Notes (Signed)
Inpatient Rehab Admissions Coordinator:   Will place CIR consult and f/u with patient and family at bedside today.   Shann Medal, PT, DPT Admissions Coordinator [phone] 05/07/22  9:45 AM

## 2022-05-07 NOTE — Progress Notes (Signed)
Inpatient Rehab Admissions Coordinator:   Met with patient, his s/o [name], and his father, Herbie [place], at the bedside to discuss CIR goals/expectations.  Also discussed case with Dr. Naaman Plummer this morning who is in agreement that pt is appropriate for our program.  With family I discussed 3 hrs/day of therapy (PT/OT/SLP), physician follow up, estimated length of stay 4 weeks, goals of mod assist.  I reviewed that mod assist means that pt would ideally be able to complete up to 50% of any given task with caregiver making up the difference.  Plan for pt to d/c either back home with [name] or to [facility] with family stepping in to provide 24/7 care.  I reiterated that it would be a lot of care, but that we would assist with setting up needs for discharge, and family education when the time comes.  [name] and Herbie [place] appreciative and on board for transition to CIR when bed available.  I reviewed cost of care and will provide a hard estimate at time of admission.  Will follow.   Shann Medal, PT, DPT Admissions Coordinator [phone] 05/07/22  2:57 PM

## 2022-05-07 NOTE — Progress Notes (Signed)
Pt required suctioning this morning. Trach cap was removed and deep suctioning was performed. Pt trach was recapped. RT will continue to monitor for any changes.

## 2022-05-08 ENCOUNTER — Encounter (HOSPITAL_COMMUNITY): Payer: Self-pay | Admitting: Physical Medicine & Rehabilitation

## 2022-05-08 ENCOUNTER — Other Ambulatory Visit: Payer: Self-pay

## 2022-05-08 ENCOUNTER — Inpatient Hospital Stay (HOSPITAL_COMMUNITY)
Admission: RE | Admit: 2022-05-08 | Discharge: 2022-06-30 | DRG: 945 | Disposition: A | Discharge status: Still In Hospital | Payer: Medicaid Other | Source: Intra-hospital | Attending: Physical Medicine & Rehabilitation | Admitting: Physical Medicine & Rehabilitation

## 2022-05-08 DIAGNOSIS — R4182 Altered mental status, unspecified: Secondary | ICD-10-CM | POA: Diagnosis not present

## 2022-05-08 DIAGNOSIS — R1310 Dysphagia, unspecified: Secondary | ICD-10-CM | POA: Diagnosis present

## 2022-05-08 DIAGNOSIS — S062XAD Diffuse traumatic brain injury with loss of consciousness status unknown, subsequent encounter: Secondary | ICD-10-CM

## 2022-05-08 DIAGNOSIS — R4701 Aphasia: Secondary | ICD-10-CM | POA: Diagnosis present

## 2022-05-08 DIAGNOSIS — E440 Moderate protein-calorie malnutrition: Secondary | ICD-10-CM | POA: Diagnosis present

## 2022-05-08 DIAGNOSIS — G9340 Encephalopathy, unspecified: Secondary | ICD-10-CM | POA: Diagnosis not present

## 2022-05-08 DIAGNOSIS — I10 Essential (primary) hypertension: Secondary | ICD-10-CM | POA: Diagnosis present

## 2022-05-08 DIAGNOSIS — Z931 Gastrostomy status: Secondary | ICD-10-CM | POA: Diagnosis not present

## 2022-05-08 DIAGNOSIS — Z86718 Personal history of other venous thrombosis and embolism: Secondary | ICD-10-CM | POA: Diagnosis not present

## 2022-05-08 DIAGNOSIS — K5900 Constipation, unspecified: Secondary | ICD-10-CM | POA: Diagnosis not present

## 2022-05-08 DIAGNOSIS — G249 Dystonia, unspecified: Secondary | ICD-10-CM | POA: Diagnosis not present

## 2022-05-08 DIAGNOSIS — R569 Unspecified convulsions: Secondary | ICD-10-CM | POA: Diagnosis not present

## 2022-05-08 DIAGNOSIS — S069X4D Unspecified intracranial injury with loss of consciousness of 6 hours to 24 hours, subsequent encounter: Secondary | ICD-10-CM | POA: Diagnosis not present

## 2022-05-08 DIAGNOSIS — I69319 Unspecified symptoms and signs involving cognitive functions following cerebral infarction: Secondary | ICD-10-CM | POA: Diagnosis not present

## 2022-05-08 DIAGNOSIS — K921 Melena: Secondary | ICD-10-CM | POA: Diagnosis not present

## 2022-05-08 DIAGNOSIS — J9601 Acute respiratory failure with hypoxia: Secondary | ICD-10-CM | POA: Diagnosis present

## 2022-05-08 DIAGNOSIS — R5383 Other fatigue: Secondary | ICD-10-CM | POA: Diagnosis not present

## 2022-05-08 DIAGNOSIS — I82441 Acute embolism and thrombosis of right tibial vein: Secondary | ICD-10-CM | POA: Diagnosis not present

## 2022-05-08 DIAGNOSIS — S069X4A Unspecified intracranial injury with loss of consciousness of 6 hours to 24 hours, initial encounter: Secondary | ICD-10-CM | POA: Diagnosis not present

## 2022-05-08 DIAGNOSIS — S066XAD Traumatic subarachnoid hemorrhage with loss of consciousness status unknown, subsequent encounter: Principal | ICD-10-CM

## 2022-05-08 DIAGNOSIS — R000 Tachycardia, unspecified: Secondary | ICD-10-CM | POA: Diagnosis present

## 2022-05-08 DIAGNOSIS — S069X0A Unspecified intracranial injury without loss of consciousness, initial encounter: Secondary | ICD-10-CM | POA: Diagnosis not present

## 2022-05-08 DIAGNOSIS — R739 Hyperglycemia, unspecified: Secondary | ICD-10-CM | POA: Diagnosis present

## 2022-05-08 DIAGNOSIS — R4189 Other symptoms and signs involving cognitive functions and awareness: Secondary | ICD-10-CM | POA: Diagnosis present

## 2022-05-08 DIAGNOSIS — J9503 Malfunction of tracheostomy stoma: Secondary | ICD-10-CM | POA: Diagnosis not present

## 2022-05-08 DIAGNOSIS — S12000D Unspecified displaced fracture of first cervical vertebra, subsequent encounter for fracture with routine healing: Secondary | ICD-10-CM | POA: Diagnosis not present

## 2022-05-08 DIAGNOSIS — R1312 Dysphagia, oropharyngeal phase: Secondary | ICD-10-CM | POA: Diagnosis not present

## 2022-05-08 DIAGNOSIS — Z9911 Dependence on respirator [ventilator] status: Secondary | ICD-10-CM | POA: Diagnosis not present

## 2022-05-08 DIAGNOSIS — F17210 Nicotine dependence, cigarettes, uncomplicated: Secondary | ICD-10-CM | POA: Diagnosis present

## 2022-05-08 DIAGNOSIS — Z6826 Body mass index (BMI) 26.0-26.9, adult: Secondary | ICD-10-CM | POA: Diagnosis not present

## 2022-05-08 DIAGNOSIS — R482 Apraxia: Secondary | ICD-10-CM | POA: Diagnosis present

## 2022-05-08 DIAGNOSIS — S01311D Laceration without foreign body of right ear, subsequent encounter: Secondary | ICD-10-CM | POA: Diagnosis present

## 2022-05-08 DIAGNOSIS — J690 Pneumonitis due to inhalation of food and vomit: Secondary | ICD-10-CM | POA: Diagnosis present

## 2022-05-08 DIAGNOSIS — R451 Restlessness and agitation: Secondary | ICD-10-CM | POA: Diagnosis present

## 2022-05-08 DIAGNOSIS — I824Z1 Acute embolism and thrombosis of unspecified deep veins of right distal lower extremity: Secondary | ICD-10-CM

## 2022-05-08 DIAGNOSIS — S02113D Unspecified occipital condyle fracture, subsequent encounter for fracture with routine healing: Secondary | ICD-10-CM | POA: Diagnosis not present

## 2022-05-08 DIAGNOSIS — Z930 Tracheostomy status: Secondary | ICD-10-CM

## 2022-05-08 DIAGNOSIS — R251 Tremor, unspecified: Secondary | ICD-10-CM | POA: Diagnosis not present

## 2022-05-08 DIAGNOSIS — S01512D Laceration without foreign body of oral cavity, subsequent encounter: Secondary | ICD-10-CM | POA: Diagnosis not present

## 2022-05-08 DIAGNOSIS — E7220 Disorder of urea cycle metabolism, unspecified: Secondary | ICD-10-CM | POA: Diagnosis not present

## 2022-05-08 DIAGNOSIS — R6520 Severe sepsis without septic shock: Secondary | ICD-10-CM | POA: Diagnosis not present

## 2022-05-08 DIAGNOSIS — Z91010 Allergy to peanuts: Secondary | ICD-10-CM

## 2022-05-08 DIAGNOSIS — R195 Other fecal abnormalities: Secondary | ICD-10-CM | POA: Diagnosis not present

## 2022-05-08 DIAGNOSIS — Z515 Encounter for palliative care: Secondary | ICD-10-CM | POA: Diagnosis not present

## 2022-05-08 DIAGNOSIS — S069X4S Unspecified intracranial injury with loss of consciousness of 6 hours to 24 hours, sequela: Secondary | ICD-10-CM | POA: Diagnosis not present

## 2022-05-08 DIAGNOSIS — S069X3S Unspecified intracranial injury with loss of consciousness of 1 hour to 5 hours 59 minutes, sequela: Secondary | ICD-10-CM

## 2022-05-08 DIAGNOSIS — S062X9S Diffuse traumatic brain injury with loss of consciousness of unspecified duration, sequela: Secondary | ICD-10-CM | POA: Diagnosis not present

## 2022-05-08 DIAGNOSIS — I63419 Cerebral infarction due to embolism of unspecified middle cerebral artery: Secondary | ICD-10-CM | POA: Diagnosis not present

## 2022-05-08 DIAGNOSIS — Z7189 Other specified counseling: Secondary | ICD-10-CM | POA: Diagnosis not present

## 2022-05-08 DIAGNOSIS — S12000S Unspecified displaced fracture of first cervical vertebra, sequela: Secondary | ICD-10-CM

## 2022-05-08 DIAGNOSIS — M7989 Other specified soft tissue disorders: Secondary | ICD-10-CM | POA: Diagnosis not present

## 2022-05-08 DIAGNOSIS — R411 Anterograde amnesia: Secondary | ICD-10-CM | POA: Diagnosis not present

## 2022-05-08 DIAGNOSIS — R471 Dysarthria and anarthria: Secondary | ICD-10-CM | POA: Diagnosis present

## 2022-05-08 DIAGNOSIS — Z880 Allergy status to penicillin: Secondary | ICD-10-CM | POA: Diagnosis not present

## 2022-05-08 DIAGNOSIS — S069X0S Unspecified intracranial injury without loss of consciousness, sequela: Secondary | ICD-10-CM | POA: Diagnosis not present

## 2022-05-08 DIAGNOSIS — A0472 Enterocolitis due to Clostridium difficile, not specified as recurrent: Secondary | ICD-10-CM | POA: Diagnosis not present

## 2022-05-08 DIAGNOSIS — S062X0S Diffuse traumatic brain injury without loss of consciousness, sequela: Secondary | ICD-10-CM | POA: Diagnosis not present

## 2022-05-08 DIAGNOSIS — K117 Disturbances of salivary secretion: Secondary | ICD-10-CM | POA: Diagnosis present

## 2022-05-08 DIAGNOSIS — Z79899 Other long term (current) drug therapy: Secondary | ICD-10-CM

## 2022-05-08 DIAGNOSIS — F068 Other specified mental disorders due to known physiological condition: Secondary | ICD-10-CM | POA: Diagnosis not present

## 2022-05-08 DIAGNOSIS — R41840 Attention and concentration deficit: Secondary | ICD-10-CM | POA: Diagnosis not present

## 2022-05-08 DIAGNOSIS — R4689 Other symptoms and signs involving appearance and behavior: Secondary | ICD-10-CM | POA: Diagnosis not present

## 2022-05-08 DIAGNOSIS — S069XAD Unspecified intracranial injury with loss of consciousness status unknown, subsequent encounter: Secondary | ICD-10-CM | POA: Diagnosis not present

## 2022-05-08 DIAGNOSIS — I82451 Acute embolism and thrombosis of right peroneal vein: Secondary | ICD-10-CM | POA: Diagnosis not present

## 2022-05-08 DIAGNOSIS — A419 Sepsis, unspecified organism: Secondary | ICD-10-CM | POA: Diagnosis not present

## 2022-05-08 DIAGNOSIS — R52 Pain, unspecified: Secondary | ICD-10-CM | POA: Diagnosis not present

## 2022-05-08 DIAGNOSIS — D500 Iron deficiency anemia secondary to blood loss (chronic): Secondary | ICD-10-CM | POA: Diagnosis not present

## 2022-05-08 DIAGNOSIS — S069XAA Unspecified intracranial injury with loss of consciousness status unknown, initial encounter: Secondary | ICD-10-CM | POA: Diagnosis present

## 2022-05-08 HISTORY — DX: Enterocolitis due to Clostridium difficile, not specified as recurrent: A04.72

## 2022-05-08 HISTORY — DX: Iron deficiency: E61.1

## 2022-05-08 HISTORY — DX: Acute embolism and thrombosis of unspecified deep veins of unspecified lower extremity: I82.409

## 2022-05-08 HISTORY — DX: Essential (primary) hypertension: I10

## 2022-05-08 HISTORY — DX: Unspecified intracranial injury with loss of consciousness status unknown, initial encounter: S06.9XAA

## 2022-05-08 LAB — GLUCOSE, CAPILLARY
Glucose-Capillary: 113 mg/dL — ABNORMAL HIGH (ref 70–99)
Glucose-Capillary: 130 mg/dL — ABNORMAL HIGH (ref 70–99)
Glucose-Capillary: 126 mg/dL — ABNORMAL HIGH (ref 70–99)
Glucose-Capillary: 99 mg/dL (ref 70–99)
Glucose-Capillary: 112 mg/dL — ABNORMAL HIGH (ref 70–99)

## 2022-05-08 MED ORDER — METHOCARBAMOL 500 MG PO TABS: 500.0000 mg | ORAL_TABLET | Freq: Four times a day (QID) | ORAL | Status: DC | PRN

## 2022-05-08 MED ORDER — INSULIN ASPART 100 UNIT/ML IJ SOLN
0.0000 [IU] | INTRAMUSCULAR | Status: DC
  Administered 2022-05-10 – 2022-05-11 (×3): 2 [IU] via SUBCUTANEOUS
  Administered 2022-05-14: 3 [IU] via SUBCUTANEOUS
  Administered 2022-05-14 – 2022-05-20 (×10): 2 [IU] via SUBCUTANEOUS
  Administered 2022-05-21: 3 [IU] via SUBCUTANEOUS
  Administered 2022-05-22 (×2): 2 [IU] via SUBCUTANEOUS
  Administered 2022-05-23: 3 [IU] via SUBCUTANEOUS

## 2022-05-08 MED ORDER — DOCUSATE SODIUM 50 MG/5ML PO LIQD
100.0000 mg | Freq: Two times a day (BID) | ORAL | Status: DC
  Administered 2022-05-08 – 2022-06-05 (×55): 100 mg
  Filled 2022-05-08 (×55): qty 10

## 2022-05-08 MED ORDER — PROCHLORPERAZINE MALEATE 5 MG PO TABS: 5.0000 mg | ORAL_TABLET | Freq: Four times a day (QID) | ORAL | Status: DC | PRN

## 2022-05-08 MED ORDER — GLYCOPYRROLATE 0.2 MG/ML IJ SOLN
0.4000 mg | Freq: Four times a day (QID) | INTRAMUSCULAR | Status: DC
  Administered 2022-05-08 – 2022-05-11 (×11): 0.4 mg via INTRAVENOUS
  Filled 2022-05-08 (×12): qty 2

## 2022-05-08 MED ORDER — AMANTADINE HCL 50 MG/5ML PO SOLN
200.0000 mg | Freq: Two times a day (BID) | ORAL | Status: DC
  Administered 2022-05-09 – 2022-06-02 (×47): 200 mg
  Filled 2022-05-08 (×51): qty 20

## 2022-05-08 MED ORDER — ALUM & MAG HYDROXIDE-SIMETH 200-200-20 MG/5ML PO SUSP: 30.0000 mL | ORAL | Status: DC | PRN

## 2022-05-08 MED ORDER — DIPHENHYDRAMINE HCL 12.5 MG/5ML PO ELIX: 12.5000 mg | ORAL_SOLUTION | Freq: Four times a day (QID) | ORAL | Status: DC | PRN

## 2022-05-08 MED ORDER — METHYLPHENIDATE HCL 5 MG PO TABS: 5.0000 mg | ORAL_TABLET | Freq: Two times a day (BID) | ORAL | Status: DC

## 2022-05-08 MED ORDER — CHLORHEXIDINE GLUCONATE 0.12% ORAL RINSE (MEDLINE KIT)
15.0000 mL | Freq: Two times a day (BID) | OROMUCOSAL | Status: DC
  Administered 2022-05-09 – 2022-05-16 (×15): 15 mL via OROMUCOSAL

## 2022-05-08 MED ORDER — POLYETHYLENE GLYCOL 3350 17 G PO PACK
17.0000 g | PACK | Freq: Every day | ORAL | Status: DC
  Administered 2022-05-09: 17 g
  Filled 2022-05-08 (×2): qty 1

## 2022-05-08 MED ORDER — PROCHLORPERAZINE EDISYLATE 10 MG/2ML IJ SOLN: 5.0000 mg | Freq: Four times a day (QID) | INTRAMUSCULAR | Status: DC | PRN

## 2022-05-08 MED ORDER — METOPROLOL TARTRATE 25 MG/10 ML ORAL SUSPENSION
50.0000 mg | Freq: Two times a day (BID) | ORAL | Status: DC
  Administered 2022-05-08 – 2022-05-24 (×31): 50 mg
  Filled 2022-05-08 (×34): qty 20

## 2022-05-08 MED ORDER — ALBUTEROL SULFATE (2.5 MG/3ML) 0.083% IN NEBU
2.5000 mg | INHALATION_SOLUTION | Freq: Four times a day (QID) | RESPIRATORY_TRACT | Status: DC | PRN
  Administered 2022-05-13: 2.5 mg via RESPIRATORY_TRACT
  Filled 2022-05-08: qty 3

## 2022-05-08 MED ORDER — ACETAMINOPHEN 160 MG/5ML PO SOLN
1000.0000 mg | Freq: Four times a day (QID) | ORAL | Status: DC | PRN
  Administered 2022-05-12 – 2022-06-30 (×11): 1000 mg
  Filled 2022-05-08 (×11): qty 40.6

## 2022-05-08 MED ORDER — BACITRACIN ZINC 500 UNIT/GM EX OINT
TOPICAL_OINTMENT | Freq: Two times a day (BID) | CUTANEOUS | Status: DC
  Administered 2022-05-31 – 2022-06-25 (×2): 1 via TOPICAL
  Filled 2022-05-08 (×2): qty 28.4

## 2022-05-08 MED ORDER — PROSOURCE TF PO LIQD
45.0000 mL | Freq: Four times a day (QID) | ORAL | Status: DC
  Administered 2022-05-08 – 2022-06-29 (×208): 45 mL
  Filled 2022-05-08 (×208): qty 45

## 2022-05-08 MED ORDER — GUAIFENESIN 100 MG/5ML PO LIQD
15.0000 mL | ORAL | Status: DC
  Administered 2022-05-08 – 2022-05-26 (×100): 15 mL
  Filled 2022-05-08 (×99): qty 15

## 2022-05-08 MED ORDER — ZINC OXIDE 11.3 % EX CREA
TOPICAL_CREAM | Freq: Two times a day (BID) | CUTANEOUS | Status: DC
  Administered 2022-05-17 – 2022-06-13 (×2): 1 via TOPICAL
  Filled 2022-05-08 (×3): qty 56

## 2022-05-08 MED ORDER — METHYLPHENIDATE HCL 5 MG PO TABS
10.0000 mg | ORAL_TABLET | Freq: Two times a day (BID) | ORAL | Status: DC
Start: 2022-05-09 — End: 2022-05-13
  Administered 2022-05-09 – 2022-05-12 (×8): 10 mg
  Filled 2022-05-08 (×8): qty 2

## 2022-05-08 MED ORDER — GUAIFENESIN-DM 100-10 MG/5ML PO SYRP
5.0000 mL | ORAL_SOLUTION | Freq: Four times a day (QID) | ORAL | Status: DC | PRN
  Administered 2022-05-11 (×2): 10 mL via ORAL
  Filled 2022-05-08 (×2): qty 10

## 2022-05-08 MED ORDER — MUPIROCIN CALCIUM 2 % EX CREA
TOPICAL_CREAM | Freq: Every day | CUTANEOUS | Status: DC
  Administered 2022-05-17 – 2022-06-05 (×2): 1 via TOPICAL
  Filled 2022-05-08: qty 15

## 2022-05-08 MED ORDER — ORAL CARE MOUTH RINSE
15.0000 mL | OROMUCOSAL | Status: DC
  Administered 2022-05-08 – 2022-05-16 (×74): 15 mL via OROMUCOSAL

## 2022-05-08 MED ORDER — JEVITY 1.5 CAL/FIBER PO LIQD
1000.0000 mL | ORAL | Status: DC
  Administered 2022-05-08 – 2022-05-10 (×3): 1000 mL
  Filled 2022-05-08 (×8): qty 1000

## 2022-05-08 MED ORDER — APIXABAN 5 MG PO TABS
5.0000 mg | ORAL_TABLET | Freq: Two times a day (BID) | ORAL | Status: DC
  Administered 2022-05-08 – 2022-06-29 (×104): 5 mg
  Filled 2022-05-08 (×107): qty 1

## 2022-05-08 MED ORDER — PROCHLORPERAZINE 25 MG RE SUPP: 12.5000 mg | Freq: Four times a day (QID) | RECTAL | Status: DC | PRN

## 2022-05-08 MED ORDER — TRAZODONE HCL 50 MG PO TABS
25.0000 mg | ORAL_TABLET | Freq: Every evening | ORAL | Status: DC | PRN
  Administered 2022-05-11: 50 mg via ORAL
  Filled 2022-05-08: qty 1

## 2022-05-08 NOTE — Progress Notes (Signed)
Inpatient Rehab Admissions Coordinator:   I have a bed available for this patient to admit to CIR today.  Brooke Meuth, PA-C, in agreement.  I will let pt/family and TOC team know.    Estill Dooms, PT, DPT Admissions Coordinator [phone] 05/08/22  10:21 AM

## 2022-05-08 NOTE — Discharge Instructions (Addendum)
Inpatient Rehab Discharge Instructions  Carl Carpenter Discharge date and time:    Activities/Precautions/ Functional Status: Activity: no lifting, driving, or strenuous exercise until cleared by MD Diet: regular diet Wound Care: none needed Functional status:  ___ No restrictions     ___ Walk up steps independently _x__ 24/7 supervision/assistance   ___ Walk up steps with assistance ___ Intermittent supervision/assistance  __ Bathe/dress independently ___ Walk with walker     __x_ Bathe/dress with assistance ___ Walk Independently    ___ Shower independently ___ Walk with assistance    ___ Shower with assistance __x_ No alcohol     ___ Return to work/school ________   COMMUNITY REFERRALS UPON DISCHARGE:    Home Health:   PT     OT       RN    SNA    SW                   Agency: [facility]  Phone: [phone] *Please expect follow-up within 2-3 days of discharge to schedule your home visit. If you have not received follow-up, be sure to contact the branch directly.*    Medical Equipment/Items Ordered:                                                 Agency/Supplier:  GENERAL COMMUNITY RESOURCES FOR PATIENT/FAMILY:   Special Instructions:  No driving, alcohol consumption or tobacco use.   My questions have been answered and I understand these instructions. I will adhere to these goals and the provided educational materials after my discharge from the hospital.  Patient/Caregiver Signature _______________________________ Date __________  Clinician Signature _______________________________________ Date __________  Please bring this form and your medication list with you to all your follow-up doctor's appointments.    ___________________________________________________________________________________________________ Information on my medicine - ELIQUIS (apixaban)  This medication education was reviewed with me or my healthcare representative as part  of my discharge preparation.  Why was Eliquis prescribed for you? Eliquis was prescribed to treat blood clots that may have been found in the veins of your legs (deep vein thrombosis) or in your lungs (pulmonary embolism) and to reduce the risk of them occurring again.  What do You need to know about Eliquis ? The dose is ONE 5 mg tablet taken TWICE daily.  Eliquis may be taken with or without food.   Try to take the dose about the same time in the morning and in the evening. If you have difficulty swallowing the tablet whole please discuss with your pharmacist how to take the medication safely.  Take Eliquis exactly as prescribed and DO NOT stop taking Eliquis without talking to the doctor who prescribed the medication.  Stopping may increase your risk of developing a new blood clot.  Refill your prescription before you run out.  After discharge, you should have regular check-up appointments with your healthcare provider that is prescribing your Eliquis.    What do you do if you miss a dose? If a dose of ELIQUIS is not taken at the scheduled time, take it as soon as possible on the same day and twice-daily administration should be resumed. The dose should not be doubled to make up for a missed dose.  Important Safety Information A possible side effect of Eliquis is bleeding. You should call your healthcare provider right  away if you experience any of the following: Bleeding from an injury or your nose that does not stop. Unusual colored urine (red or dark brown) or unusual colored stools (red or black). Unusual bruising for unknown reasons. A serious fall or if you hit your head (even if there is no bleeding).  Some medicines may interact with Eliquis and might increase your risk of bleeding or clotting while on Eliquis. To help avoid this, consult your healthcare provider or pharmacist prior to using any new prescription or non-prescription medications, including herbals,  vitamins, non-steroidal anti-inflammatory drugs (NSAIDs) and supplements.  This website has more information on Eliquis (apixaban): http://www.eliquis.com/eliquis/home
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
## 2022-05-08 NOTE — Plan of Care (Signed)
  Problem: Clinical Measurements: Goal: Will remain free from infection Outcome: Not Progressing   Problem: Clinical Measurements: Goal: Respiratory complications will improve Outcome: Not Progressing   Problem: Elimination: Goal: Will not experience complications related to bowel motility Outcome: Not Progressing   Problem: Nutrition: Goal: Adequate nutrition will be maintained Outcome: Not Progressing   Problem: Safety: Goal: Ability to remain free from injury will improve Outcome: Not Progressing
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
## 2022-05-08 NOTE — Progress Notes (Signed)
Inpatient Rehabilitation Admission Medication Review by a Pharmacist  A complete drug regimen review was completed for this patient to identify any potential clinically significant medication issues.  High Risk Drug Classes Is patient taking? Indication by Medication  Antipsychotic Yes Compazine- N/V  Anticoagulant Yes Apixaban- DVT  Antibiotic No   Opioid No   Antiplatelet No   Hypoglycemics/insulin Yes Insulin- T2DM  Vasoactive Medication Yes Lopressor- rate control  Chemotherapy No   Other Yes Ritalin- alertness     Type of Medication Issue Identified Description of Issue Recommendation(s)  Drug Interaction(s) (clinically significant)     Duplicate Therapy     Allergy     No Medication Administration End Date     Incorrect Dose     Additional Drug Therapy Needed     Significant med changes from prior encounter (inform family/care partners about these prior to discharge).    Other       Clinically significant medication issues were identified that warrant physician communication and completion of prescribed/recommended actions by midnight of the next day:  No   Time spent performing this drug regimen review (minutes):  30  Jeanella Cara, PharmD, [place] Clinical Pharmacist Please see AMION for all Pharmacists' Contact Phone Numbers 05/08/2022, 3:54 PM
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
## 2022-05-08 NOTE — Progress Notes (Incomplete)
Inpatient Rehabilitation Admission Medication Review by a Pharmacist  A complete drug regimen review was completed for this patient to identify any potential clinically significant medication issues.  High Risk Drug Classes Is patient taking? Indication by Medication  Antipsychotic Yes Compazine- N/V  Anticoagulant Yes Apixaban- DVT  Antibiotic No   Opioid No   Antiplatelet No   Hypoglycemics/insulin Yes Insulin- T2DM  Vasoactive Medication Yes Lopressor- rate control  Chemotherapy No   Other Yes Ritalin- alertness     Type of Medication Issue Identified Description of Issue Recommendation(s)  Drug Interaction(s) (clinically significant)     Duplicate Therapy     Allergy     No Medication Administration End Date     Incorrect Dose     Additional Drug Therapy Needed     Significant med changes from prior encounter (inform family/care partners about these prior to discharge).    Other       Clinically significant medication issues were identified that warrant physician communication and completion of prescribed/recommended actions by midnight of the next day:  No   Time spent performing this drug regimen review (minutes):  30   Karin Pinedo BS, PharmD, BCPS Clinical Pharmacist 05/08/2022 1:30 PM  Contact: [phone] after 3 PM  "Be curious, not judgmental..." -Debbora Dus
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
## 2022-05-08 NOTE — TOC Transition Note (Signed)
Transition of Care [facility]) - CM/SW Discharge Note   Patient Details  Name: Carl Carpenter MRN: [id number] Date of Birth: 06-Jan-1986  Transition of Care [facility] CM/SW Contact:  Glennon Mac, RN Phone Number: 05/08/2022, 11:52 AM   Clinical Narrative:    Pt medically stable for discharge today, and has been accepted for admission to [facility] IP Rehab.  Plan for transfer to CIR pending bed available.    Final next level of care: IP Rehab Facility Barriers to Discharge: Barriers Resolved   Patient Goals and CMS Choice   CMS Medicare.gov Compare Post Acute Care list provided to:: Patient Represenative (must comment) (significant other, parents) Choice offered to / list presented to : Spouse, Parent                        Discharge Plan and Services   Discharge Planning Services: CM Consult Post Acute Care Choice: IP Rehab                               Social Determinants of Health (SDOH) Interventions     Readmission Risk Interventions     View : No data to display.         Quintella Baton, RN, BSN  Trauma/Neuro ICU Case Manager [phone]
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
## 2022-05-09 DIAGNOSIS — R739 Hyperglycemia, unspecified: Secondary | ICD-10-CM

## 2022-05-09 DIAGNOSIS — D500 Iron deficiency anemia secondary to blood loss (chronic): Secondary | ICD-10-CM

## 2022-05-09 DIAGNOSIS — R5383 Other fatigue: Secondary | ICD-10-CM

## 2022-05-09 DIAGNOSIS — S069X0A Unspecified intracranial injury without loss of consciousness, initial encounter: Secondary | ICD-10-CM

## 2022-05-09 LAB — CBC WITH DIFFERENTIAL/PLATELET
Abs Immature Granulocytes: 0.02 10*3/uL (ref 0.00–0.07)
Basophils Absolute: 0.1 10*3/uL (ref 0.0–0.1)
Basophils Relative: 1 %
Eosinophils Absolute: 0.3 10*3/uL (ref 0.0–0.5)
Eosinophils Relative: 5 %
HCT: 39.4 % (ref 39.0–52.0)
Hemoglobin: 13.7 g/dL (ref 13.0–17.0)
Immature Granulocytes: 0 %
Lymphocytes Relative: 34 %
Lymphs Abs: 2.3 10*3/uL (ref 0.7–4.0)
MCH: 31.9 pg (ref 26.0–34.0)
MCHC: 34.8 g/dL (ref 30.0–36.0)
MCV: 91.8 fL (ref 80.0–100.0)
Monocytes Absolute: 0.6 10*3/uL (ref 0.1–1.0)
Monocytes Relative: 9 %
Neutro Abs: 3.6 10*3/uL (ref 1.7–7.7)
Neutrophils Relative %: 51 %
Platelets: 141 10*3/uL — ABNORMAL LOW (ref 150–400)
RBC: 4.29 MIL/uL (ref 4.22–5.81)
RDW: 12.7 % (ref 11.5–15.5)
WBC: 7 10*3/uL (ref 4.0–10.5)
nRBC: 0 % (ref 0.0–0.2)

## 2022-05-09 LAB — GLUCOSE, CAPILLARY
Glucose-Capillary: 100 mg/dL — ABNORMAL HIGH (ref 70–99)
Glucose-Capillary: 101 mg/dL — ABNORMAL HIGH (ref 70–99)
Glucose-Capillary: 107 mg/dL — ABNORMAL HIGH (ref 70–99)
Glucose-Capillary: 107 mg/dL — ABNORMAL HIGH (ref 70–99)
Glucose-Capillary: 112 mg/dL — ABNORMAL HIGH (ref 70–99)
Glucose-Capillary: 114 mg/dL — ABNORMAL HIGH (ref 70–99)

## 2022-05-09 LAB — COMPREHENSIVE METABOLIC PANEL
ALT: 19 U/L (ref 0–44)
AST: 15 U/L (ref 15–41)
Albumin: 3.5 g/dL (ref 3.5–5.0)
Alkaline Phosphatase: 70 U/L (ref 38–126)
Anion gap: 9 (ref 5–15)
BUN: 19 mg/dL (ref 6–20)
CO2: 29 mmol/L (ref 22–32)
Calcium: 9.5 mg/dL (ref 8.9–10.3)
Chloride: 99 mmol/L (ref 98–111)
Creatinine, Ser: 0.72 mg/dL (ref 0.61–1.24)
GFR, Estimated: >60 mL/min (ref >60)
Glucose, Bld: 115 mg/dL — ABNORMAL HIGH (ref 70–99)
Potassium: 3.8 mmol/L (ref 3.5–5.1)
Sodium: 137 mmol/L (ref 135–145)
Total Bilirubin: 0.3 mg/dL (ref 0.3–1.2)
Total Protein: 7 g/dL (ref 6.5–8.1)

## 2022-05-09 LAB — IRON AND TIBC
Iron: 44 ug/dL — ABNORMAL LOW (ref 45–182)
Saturation Ratios: 14 % — ABNORMAL LOW (ref 17.9–39.5)
TIBC: 318 ug/dL (ref 250–450)
UIBC: 274 ug/dL

## 2022-05-09 LAB — VITAMIN B12: Vitamin B-12: 1184 pg/mL — ABNORMAL HIGH (ref 180–914)

## 2022-05-09 MED ORDER — FERROUS SULFATE 220 (44 FE) MG/5ML PO ELIX
220.0000 mg | ORAL_SOLUTION | Freq: Every day | ORAL | Status: DC
  Administered 2022-05-10 – 2022-05-11 (×2): 220 mg via ORAL
  Filled 2022-05-09 (×2): qty 5
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
## 2022-05-09 NOTE — Progress Notes (Signed)
Physical Therapy Session Note  Patient Details  Name: Carl Carpenter MRN: [id number] Date of Birth: [birth year]  Today's Date: 05/09/2022 PT Individual Time: 1530-1605 PT Individual Time Calculation (min): 35 min   Short Term Goals: Week 1:  PT Short Term Goal 1 (Week 1): Pt will tolerate sitting in WC 3 hours between therapy PT Short Term Goal 2 (Week 1): Pt will sit EOB with max assist of 1. PT Short Term Goal 3 (Week 1): Pt will attend to therapy task for ~5 mintues with max cues PT Short Term Goal 4 (Week 1): Pt will remain aroused throughout entire PT treatment session  Skilled Therapeutic Interventions/Progress Updates:   Pt received sitting in WC and agreeable to PT. Pt awake and alert. Wife and mother present reporting that pt was able to utilize communication board for yes no answers. PT attempted to quest pain and dewsire to get in bed with inconsistent results. Maximove transfer to bed. Rolling R and L to remove sling. Pt noted to be incontinent of bowel . Performed pericare and noted pt to continue to be incontinent. Bed pan placed with mother present in room to continue BM. Pt left in bed with mother and Nt present.     Therapy Documentation Precautions:  Precautions Precautions: Fall Precaution Comments: capped trach, PEG, binder Restrictions Weight Bearing Restrictions: No    Vital Signs: Oxygen Therapy O2 Device: Nasal Cannula O2 Flow Rate (L/min): 2 L/min FiO2 (%): 98 % Pain: Pain Assessment Pain Scale: 0-10 Pain Score: 0-No pain    Therapy/Group: Individual Therapy  Lorie Phenix 05/09/2022, 6:04 PM
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
## 2022-05-09 NOTE — Plan of Care (Signed)
  Problem: RH Balance Goal: LTG Patient will maintain dynamic sitting balance (PT) Description: LTG:  Patient will maintain dynamic sitting balance with assistance during mobility activities (PT) Flowsheets (Taken 05/09/2022 1428) LTG: Pt will maintain dynamic sitting balance during mobility activities with:: Moderate Assistance - Patient 50 - 74% Goal: LTG Patient will maintain dynamic standing balance (PT) Description: LTG:  Patient will maintain dynamic standing balance with assistance during mobility activities (PT) Flowsheets (Taken 05/09/2022 1428) LTG: Pt will maintain dynamic standing balance during mobility activities with:: Maximal Assistance - Patient 25 - 49%   Problem: Sit to Stand Goal: LTG:  Patient will perform sit to stand with assistance level (PT) Description: LTG:  Patient will perform sit to stand with assistance level (PT) Flowsheets (Taken 05/09/2022 1428) LTG: PT will perform sit to stand in preparation for functional mobility with assistance level: Maximal Assistance - Patient 25 - 49%   Problem: RH Bed Mobility Goal: LTG Patient will perform bed mobility with assist (PT) Description: LTG: Patient will perform bed mobility with assistance, with/without cues (PT). Flowsheets (Taken 05/09/2022 1428) LTG: Pt will perform bed mobility with assistance level of: Maximal Assistance - Patient 25 - 49%   Problem: RH Bed to Chair Transfers Goal: LTG Patient will perform bed/chair transfers w/assist (PT) Description: LTG: Patient will perform bed to chair transfers with assistance (PT). Flowsheets (Taken 05/09/2022 1428) LTG: Pt will perform Bed to Chair Transfers with assistance level: Maximal Assistance - Patient 25 - 49%   Problem: RH Attention Goal: LTG Patient will demonstrate this level of attention during functional activites (PT) Description: LTG:  Patient will demonstrate this level of attention during functional activites (PT) Flowsheets (Taken 05/09/2022  1428) Patient will demonstrate this level of attention during functional activites: Sustained Patient will demonstrate above attention level in the following environment: Controlled LTG: Patient will demonstrate attention during functional mobility with assistance of: Moderate Assistance - Patient 50 - 74%

## 2022-05-10 LAB — GLUCOSE, CAPILLARY
Glucose-Capillary: 104 mg/dL — ABNORMAL HIGH (ref 70–99)
Glucose-Capillary: 107 mg/dL — ABNORMAL HIGH (ref 70–99)
Glucose-Capillary: 109 mg/dL — ABNORMAL HIGH (ref 70–99)
Glucose-Capillary: 131 mg/dL — ABNORMAL HIGH (ref 70–99)
Glucose-Capillary: 93 mg/dL (ref 70–99)

## 2022-05-10 NOTE — Progress Notes (Signed)
Pt trach removed per MD order gauze placed over stoma site. Stoma site noted to have redness. RN aware and will monitor. Pt SpO2 and vitals stable. No complications noted.

## 2022-05-10 NOTE — Progress Notes (Signed)
PROGRESS NOTE   Subjective/Complaints: Cap coming off trach when he coughs- SPO2 stable, trach ready to come out as per respiratory therapists at bedside- removed without complication  ROS: +tremor of left shoulder (migrates as per family)   Objective:   No results found. Recent Labs    05/09/22 0532  WBC 7.0  HGB 13.7  HCT 39.4  PLT 141*   Recent Labs    05/09/22 0532  NA 137  K 3.8  CL 99  CO2 29  GLUCOSE 115*  BUN 19  CREATININE 0.72  CALCIUM 9.5    Intake/Output Summary (Last 24 hours) at 05/10/2022 1950 Last data filed at 05/10/2022 1613 Gross per 24 hour  Intake 1340 ml  Output 2000 ml  Net -660 ml     Pressure Injury 03/31/22 Head Posterior Stage 2 -  Partial thickness loss of dermis presenting as a shallow open injury with a red, pink wound bed without slough. (Active)  03/31/22 1440  Location: Head  Location Orientation: Posterior  Staging: Stage 2 -  Partial thickness loss of dermis presenting as a shallow open injury with a red, pink wound bed without slough.  Wound Description (Comments):   Present on Admission:     Physical Exam: Vital Signs Blood pressure 121/86, pulse 80, temperature 98.1 F (36.7 C), resp. rate 18, SpO2 98 %.  Gen: no distress, normal appearing HENT:     Head: Normocephalic and atraumatic.     Right Ear: External ear normal.     Left Ear: External ear normal.     Nose: Nose normal.     Mouth/Throat:     Comments: Oral mucosa a little dry Eyes:     General: No scleral icterus.    Conjunctiva/sclera: Conjunctivae normal.  Neck:     Comments: #4 trach, uncuffed, capped Cardiovascular:     Rate and Rhythm: Normal rate and regular rhythm.     Heart sounds: No murmur heard.   No gallop.  Pulmonary:     Effort: Pulmonary effort is normal.     Comments: A few scattered rhonchi and upper airway sounds. Occasionally coughs d/t trach irritation Abdominal:      General: Bowel sounds are normal. There is no distension.     Palpations: Abdomen is soft.     Comments: Sl tender around PEG. Skin is excoriated around PEG bumper. Mild brownish drainage from within stoma.   Genitourinary:    Penis: Normal.   Musculoskeletal:        General: No swelling, tenderness or deformity.     Right lower leg: No edema.     Left lower leg: No edema.     Comments: Able to fully range arms and legs. No jt abnl or signs of HO.   Skin:    Comments: Abrasion left knee, scalp, foam dressing sacrum and bilateral heels. Did not see breakdown on heels but could not move him to examine sacrum.   Neurological:     Comments: Pt is alert, makign good eye contact. Tends to look to right. Moved tongue when I asked him to protrude it, but could not move it out of his mouth. Squeezed with his  left hand, flexes left elbow. Perhaps some left hip and knee flexion also but inconsistent in general with any volitional movement. Doesn't initiate movement on right. Decreased LT and pain in all 4's. DTR's a little brisk but no resting tone, ?2-3 beats of clonus RLE.  Tremor of left shoulder Psychiatric:     Comments: Flat, but maintaining good eye contact   Assessment/Plan: 1. Functional deficits which require 3+ hours per day of interdisciplinary therapy in a comprehensive inpatient rehab setting. Physiatrist is providing close team supervision and 24 hour management of active medical problems listed below. Physiatrist and rehab team continue to assess barriers to discharge/monitor patient progress toward functional and medical goals  Care Tool:  Bathing    Body parts bathed by patient: Right arm, Left arm, Chest, Abdomen, Front perineal area, Buttocks, Right upper leg, Left upper leg, Right lower leg, Face, Left lower leg         Bathing assist Assist Level: Total Assistance - Patient < 25%     Upper Body Dressing/Undressing Upper body dressing   What is the patient wearing?: Pull  over shirt    Upper body assist Assist Level: Dependent - Patient 0%    Lower Body Dressing/Undressing Lower body dressing    Lower body dressing activity did not occur: N/A What is the patient wearing?: Incontinence brief     Lower body assist Assist for lower body dressing: Dependent - Patient 0%     Toileting Toileting    Toileting assist Assist for toileting: Dependent - Patient 0%     Transfers Chair/bed transfer  Transfers assist  Chair/bed transfer activity did not occur: Safety/medical concerns  Chair/bed transfer assist level: Dependent - mechanical lift     Locomotion Ambulation   Ambulation assist   Ambulation activity did not occur: Safety/medical concerns          Walk 10 feet activity   Assist  Walk 10 feet activity did not occur: Safety/medical concerns        Walk 50 feet activity   Assist Walk 50 feet with 2 turns activity did not occur: Safety/medical concerns         Walk 150 feet activity   Assist Walk 150 feet activity did not occur: Safety/medical concerns         Walk 10 feet on uneven surface  activity   Assist Walk 10 feet on uneven surfaces activity did not occur: Safety/medical concerns         Wheelchair     Assist Is the patient using a wheelchair?: Yes Type of Wheelchair: Manual    Wheelchair assist level: Dependent - Patient 0%      Wheelchair 50 feet with 2 turns activity    Assist        Assist Level: Dependent - Patient 0%   Wheelchair 150 feet activity     Assist      Assist Level: Dependent - Patient 0%   Blood pressure 121/86, pulse 80, temperature 98.1 F (36.7 C), resp. rate 18, SpO2 98 %.    Medical Problem List and Plan: 1. 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 He, mother and sig other VERY engaged and supportive.              -family is doing a great job with his ROM. Will hold off on PRAFO's as they will add to agitation  Commended wife on bringing in family photos  Discussed trach removal with mother and she is agreeable 2.  DVT right posterior tibial and peroneal veins (03/22/22): -anticoagulation:  Pharmaceutical: Other (comment) Eliquis             -antiplatelet therapy: none 3. Pain: continue Tylenol prn 4. Mood: LCSW to evaluate and provide emotional support             -antipsychotic agents: none 5. Impaired attention: This patient is not capable of making decisions on his own behalf.             -continue amantadine 100mg  bid             -increase ritalin to 10mg  bid 6. Redness to stoma: monitor daily.  --foam dressing to buttocks/scalp             --continue Bactroban to abrasions 7. Fluids/Electrolytes/Nutrition: Routine Is and Os and follow-up chemistries             --continue tube feeds Jevity 1.5 at 80 mL/hr             --continue Prosource             --continue regular oral care 9: C1 right transverse process fracture--he's out of collar 10: Left Occipital condyle fracture--out of collar 11: Acute ventilator respiratory failure requiring trach and PEG 3/29             --continue tube feeds. CBGs and SSI             --cap is coming off trach and patient appears uncomfortable due to it, satting well, RT know patient well from acute care side and feel comfortable with trach being removed today for patient comfort, removed without complication.              --continue Robinul 0.4 mg IV q 6 hours             --continue Robitussin 25 mL q 4 hours 12: Right ear laceration:  healing 14: Tongue laceration: monitor healing 15: Hypertension: continue Lopressor 50 mg BID             -well controlled. 16. Hyperglycemia secondary to tube  feeds: continue to monitor, reviewed CBGs with wife.  17. Lethargy: medications reviewed with wife and did not receive any sedating medications 5/19 or 5/20: continue ritalin and amantadine.  18. Iron deficiency: start daily iron supplement.   LOS: 2 days A FACE TO FACE EVALUATION WAS PERFORMED  Carl Carpenter 05/10/2022, 7:50 PM

## 2022-05-10 NOTE — Progress Notes (Signed)
Patient slept well last night from 9pm onwards.

## 2022-05-11 ENCOUNTER — Other Ambulatory Visit (HOSPITAL_COMMUNITY): Payer: Self-pay

## 2022-05-11 DIAGNOSIS — E440 Moderate protein-calorie malnutrition: Secondary | ICD-10-CM | POA: Insufficient documentation

## 2022-05-11 DIAGNOSIS — S069X4S Unspecified intracranial injury with loss of consciousness of 6 hours to 24 hours, sequela: Secondary | ICD-10-CM

## 2022-05-11 DIAGNOSIS — F068 Other specified mental disorders due to known physiological condition: Secondary | ICD-10-CM

## 2022-05-11 DIAGNOSIS — S069X0S Unspecified intracranial injury without loss of consciousness, sequela: Secondary | ICD-10-CM

## 2022-05-11 LAB — GLUCOSE, CAPILLARY
Glucose-Capillary: 107 mg/dL — ABNORMAL HIGH (ref 70–99)
Glucose-Capillary: 109 mg/dL — ABNORMAL HIGH (ref 70–99)
Glucose-Capillary: 110 mg/dL — ABNORMAL HIGH (ref 70–99)
Glucose-Capillary: 115 mg/dL — ABNORMAL HIGH (ref 70–99)
Glucose-Capillary: 128 mg/dL — ABNORMAL HIGH (ref 70–99)
Glucose-Capillary: 143 mg/dL — ABNORMAL HIGH (ref 70–99)
Glucose-Capillary: 146 mg/dL — ABNORMAL HIGH (ref 70–99)

## 2022-05-11 MED ORDER — JEVITY 1.5 CAL/FIBER PO LIQD
474.0000 mL | Freq: Three times a day (TID) | ORAL | Status: DC
  Administered 2022-05-11 – 2022-06-01 (×81): 474 mL
  Filled 2022-05-11 (×2): qty 474
  Filled 2022-05-11: qty 1000
  Filled 2022-05-11 (×2): qty 474
  Filled 2022-05-11: qty 1000
  Filled 2022-05-11 (×10): qty 474
  Filled 2022-05-11: qty 1000
  Filled 2022-05-11: qty 474
  Filled 2022-05-11: qty 1000
  Filled 2022-05-11: qty 474
  Filled 2022-05-11 (×2): qty 1000
  Filled 2022-05-11: qty 474
  Filled 2022-05-11 (×2): qty 1000
  Filled 2022-05-11 (×3): qty 474
  Filled 2022-05-11: qty 1000
  Filled 2022-05-11 (×3): qty 474
  Filled 2022-05-11: qty 1000
  Filled 2022-05-11 (×7): qty 474
  Filled 2022-05-11 (×2): qty 1000
  Filled 2022-05-11 (×2): qty 474
  Filled 2022-05-11: qty 1000
  Filled 2022-05-11: qty 474
  Filled 2022-05-11 (×2): qty 1000
  Filled 2022-05-11 (×2): qty 474
  Filled 2022-05-11: qty 1000
  Filled 2022-05-11: qty 474
  Filled 2022-05-11: qty 1000
  Filled 2022-05-11: qty 474
  Filled 2022-05-11 (×2): qty 1000
  Filled 2022-05-11: qty 474
  Filled 2022-05-11: qty 1000
  Filled 2022-05-11 (×8): qty 474
  Filled 2022-05-11: qty 1000
  Filled 2022-05-11 (×8): qty 474
  Filled 2022-05-11: qty 1000
  Filled 2022-05-11: qty 474
  Filled 2022-05-11: qty 1000
  Filled 2022-05-11: qty 474
  Filled 2022-05-11: qty 1000
  Filled 2022-05-11 (×4): qty 474
  Filled 2022-05-11: qty 1000
  Filled 2022-05-11 (×7): qty 474

## 2022-05-11 MED ORDER — PROCHLORPERAZINE MALEATE 5 MG PO TABS: 5.0000 mg | ORAL_TABLET | Freq: Four times a day (QID) | ORAL | Status: DC | PRN

## 2022-05-11 MED ORDER — FERROUS SULFATE 220 (44 FE) MG/5ML PO ELIX
220.0000 mg | ORAL_SOLUTION | Freq: Every day | ORAL | Status: DC
  Administered 2022-05-12 – 2022-06-29 (×48): 220 mg
  Filled 2022-05-11 (×51): qty 5

## 2022-05-11 MED ORDER — FREE WATER
100.0000 mL | Freq: Three times a day (TID) | Status: DC
  Administered 2022-05-11 – 2022-06-26 (×182): 100 mL

## 2022-05-11 MED ORDER — GUAIFENESIN-DM 100-10 MG/5ML PO SYRP
5.0000 mL | ORAL_SOLUTION | Freq: Four times a day (QID) | ORAL | Status: DC | PRN
  Administered 2022-06-08 – 2022-06-16 (×6): 10 mL
  Filled 2022-05-11 (×6): qty 10

## 2022-05-11 MED ORDER — PROCHLORPERAZINE 25 MG RE SUPP: 12.5000 mg | Freq: Four times a day (QID) | RECTAL | Status: DC | PRN

## 2022-05-11 MED ORDER — JEVITY 1.5 CAL/FIBER PO LIQD
474.0000 mL | Freq: Three times a day (TID) | ORAL | Status: DC
  Filled 2022-05-11: qty 474

## 2022-05-11 MED ORDER — PROCHLORPERAZINE EDISYLATE 10 MG/2ML IJ SOLN: 5.0000 mg | Freq: Four times a day (QID) | INTRAMUSCULAR | Status: DC | PRN

## 2022-05-11 MED ORDER — JEVITY 1.5 CAL/FIBER PO LIQD: 474.0000 mL | Freq: Three times a day (TID) | ORAL | Status: DC

## 2022-05-11 MED ORDER — JEVITY 1.5 CAL/FIBER PO LIQD
237.0000 mL | Freq: Once | ORAL | Status: AC
  Administered 2022-05-11: 237 mL
  Filled 2022-05-11: qty 237

## 2022-05-11 NOTE — Progress Notes (Signed)
Patient is A&O to self. Peg tube patient. Medications given as ordered. No S/S of pain at this time. No behaviors noted throughout the day. Family at bedside.

## 2022-05-11 NOTE — Progress Notes (Signed)
Speech Language Pathology TBI Note  Patient Details  Name: Carl Carpenter MRN: [id number] Date of Birth: 1986/11/10  Today's Date: 05/11/2022 SLP Individual Time: 1030-1127 SLP Individual Time Calculation (min): 57 min  Short Term Goals: Week 1: SLP Short Term Goal 1 (Week 1): Pt will consume therapeutic trials of 1/2 teaspoons of thin liquids with minimal overt s/s of aspiration over 3 consecutive sessions and max assist use of swallowing precautions prior to MBS or FEES SLP Short Term Goal 2 (Week 1): Pt will vocalize on command in 25% of opportunities with max assist multimodal cues. SLP Short Term Goal 3 (Week 1): Pt will answer basic yes/no questions using written choices on a white board and either eye gaze or gesturing in 25% of opportunities with max assist multimodal cues. SLP Short Term Goal 4 (Week 1): Pt will follow 1 step  commands in >25% of opportunities with max assist multimodal cues. SLP Short Term Goal 5 (Week 1): Pt will focus his attention on a targeted stimulus for 30 seconds with max assist multimodal cues.  Skilled Therapeutic Interventions:  Skilled treatment session focused on cognitive-linguistic goals. Upon arrival, patient was awake while upright in the wheelchair. Patient able to focus attention to SLP with eye contact in 75% of opportunities. Patient followed simple commands during oral care with extra time and Mod verbal and tactile cues. SLP facilitated session by providing 2 separate boards (one with "yes" and one with "no.)"  Patient able to answer basic questions by pointing to the correct board with his LUE with 100% accuracy with Min verbal and tactile cues needed for initiation. SLP then asked more mildly complex questions that focused on biographical information and orientation with answers written from a field of 2 in which the patient answered with 50% accuracy. At one point, patient utilized 3 fingers to indicate the current age of his son. Throughout  session, patient with a flat affect with minimal attempts to communicate, however, intermittent spontaneous vocalizations noted that did not appear in relation to anything. Patient also remained awake and alert throughout session. Patient left upright in tilt-in-space wheelchair with his father present. Continue with current plan of care.      Pain No indications of pain   Agitated Behavior Scale: TBI Observation Details Observation Environment: CIR Start of observation period - Date: 05/11/22 Start of observation period - Time: 1030 End of observation period - Date: 05/11/22 End of observation period - Time: 1127 Agitated Behavior Scale (DO NOT LEAVE BLANKS) Short attention span, easy distractibility, inability to concentrate: Present to a slight degree Impulsive, impatient, low tolerance for pain or frustration: Absent Uncooperative, resistant to care, demanding: Absent Violent and/or threatening violence toward people or property: Absent Explosive and/or unpredictable anger: Absent Rocking, rubbing, moaning, or other self-stimulating behavior: Present to a slight degree Pulling at tubes, restraints, etc.: Absent Wandering from treatment areas: Absent Restlessness, pacing, excessive movement: Absent Repetitive behaviors, motor, and/or verbal: Absent Rapid, loud, or excessive talking: Absent Sudden changes of mood: Absent Easily initiated or excessive crying and/or laughter: Absent Self-abusiveness, physical and/or verbal: Absent Agitated behavior scale total score: 16  Therapy/Group: Individual Therapy  Rachele Lamaster 05/11/2022, 3:14 PM

## 2022-05-11 NOTE — Progress Notes (Signed)
Physical Therapy TBI Note  Patient Details  Name: Carl Carpenter MRN: [id number] Date of Birth: [birth year]  Today's Date: 05/11/2022 PT Individual Time: 1300-1414 PT Individual Time Calculation (min): 74 min   Short Term Goals: Week 1:  PT Short Term Goal 1 (Week 1): Pt will tolerate sitting in WC 3 hours between therapy PT Short Term Goal 2 (Week 1): Pt will sit EOB with max assist of 1. PT Short Term Goal 3 (Week 1): Pt will attend to therapy task for ~5 mintues with max cues PT Short Term Goal 4 (Week 1): Pt will remain aroused throughout entire PT treatment session  Skilled Therapeutic Interventions/Progress Updates:     Pt received seated in tilt in space WC with father present. Pt is awake and indicates with "yes, no" board that he is not in pain. Pt requires increased time to process and respond, but does make movement with L hand to gesture at "yes no" board. RN temporarily pauses pt's feeding tube for session. WC transport to gym for time management. Pt on 1L O2 via nasal cannula. Sats checked several time during session and typically are around 98%. Pt completes slideboard transfer to the R from [facility] to mat table with dependent +2 assist, with cues for anterior trunk lean, initiation, and sequencing. Pt sits in short sitting on edge of mat. PT positions feet to be flat on ground and to provide pt with broad base of support. Exercise ball positioned behind pt to provide back support and pt assisted into neutral, midline posture. Pt able to lift head, seemingly following PT commands, but with forward trunk lean head falls forward and pt unable to lift up without assistance. PT cues pt to reach or kick at targets and pt demonstrates minimal initiation for these movements. Pt does have consistent full body tremulousness. PT discusses with SLP who says that shaking may be premorbid. Pt performs slideboard transfer back to [facility] with dep +2 assist and same cueing. Pt then taken to high low table and  cued to reach for 1 of 2 objects, typically a color of bean bag. Pt does not show initiation to perform activity but does tolerate sitting upright in WC with vitals remaining WFL. Blood pressure taken before at 126/90 and after at 131/94. WC transport back to room. Left seated with alarm intact and all needs within reach.  Therapy Documentation Precautions:  Precautions Precautions: Fall Precaution Comments: capped trach, PEG, binder Restrictions Weight Bearing Restrictions: No Agitated Behavior Scale: TBI Observation Details Observation Environment: CIR Start of observation period - Date: 05/11/22 Start of observation period - Time: 1030 End of observation period - Date: 05/11/22 End of observation period - Time: 1127 Agitated Behavior Scale (DO NOT LEAVE BLANKS) Short attention span, easy distractibility, inability to concentrate: Present to a slight degree Impulsive, impatient, low tolerance for pain or frustration: Absent Uncooperative, resistant to care, demanding: Absent Violent and/or threatening violence toward people or property: Absent Explosive and/or unpredictable anger: Absent Rocking, rubbing, moaning, or other self-stimulating behavior: Present to a slight degree Pulling at tubes, restraints, etc.: Absent Wandering from treatment areas: Absent Restlessness, pacing, excessive movement: Absent Repetitive behaviors, motor, and/or verbal: Absent Rapid, loud, or excessive talking: Absent Sudden changes of mood: Absent Easily initiated or excessive crying and/or laughter: Absent Self-abusiveness, physical and/or verbal: Absent Agitated behavior scale total score: 16    Therapy/Group: Individual Therapy  [name], PT, DPT 05/11/2022, 4:42 PM

## 2022-05-11 NOTE — Care Management (Signed)
Inpatient Rehabilitation Center Individual Statement of Services  Patient Name:  Venkat Ankney  Date:  05/11/2022  Welcome to the Inpatient Rehabilitation Center.  Our goal is to provide you with an individualized program based on your diagnosis and situation, designed to meet your specific needs.  With this comprehensive rehabilitation program, you will be expected to participate in at least 3 hours of rehabilitation therapies Monday-Friday, with modified therapy programming on the weekends.  Your rehabilitation program will include the following services:  Physical Therapy (PT), Occupational Therapy (OT), Speech Therapy (ST), 24 hour per day rehabilitation nursing, Therapeutic Recreaction (TR), Psychology, Neuropsychology, Care Coordinator, Rehabilitation Medicine, Nutrition Services, Pharmacy Services, and Other  Weekly team conferences will be held on Tuesdays to discuss your progress.  Your Inpatient Rehabilitation Care Coordinator will talk with you frequently to get your input and to update you on team discussions.  Team conferences with you and your family in attendance may also be held.  Expected length of stay: 5-6 weeks    Overall anticipated outcome: Moderate to Max Assistance  Depending on your progress and recovery, your program may change. Your Inpatient Rehabilitation Care Coordinator will coordinate services and will keep you informed of any changes. Your Inpatient Rehabilitation Care Coordinator's name and contact numbers are listed  below.  The following services may also be recommended but are not provided by the Inpatient Rehabilitation Center:  Driving Evaluations Home Health Rehabiltiation Services Outpatient Rehabilitation Services Vocational Rehabilitation   Arrangements will be made to provide these services after discharge if needed.  Arrangements include referral to agencies that provide these services.  Your insurance has been verified to be:   Uninsured  Your primary doctor is:  No PCP  Pertinent information will be shared with your doctor and your insurance company.  Inpatient Rehabilitation Care Coordinator:  Susie Cassette [phone] or ([phone]  Information discussed with and copy given to patient by: Gretchen Short, 05/11/2022, 1:17 PM

## 2022-05-11 NOTE — IPOC Note (Signed)
Overall Plan of Care [facility]) Patient Details Name: Carl Carpenter MRN: [id number] DOB: 08-10-1986  Admitting Diagnosis: TBI (traumatic brain injury) [facility])  Hospital Problems: Principal Problem:   TBI (traumatic brain injury) (HCC)     Functional Problem List: Nursing Behavior, Bladder, Bowel, Endurance, Medication Management, Nutrition, Motor, Pain, Edema, Perception, Safety, Sensory, Skin Integrity  PT Balance, Behavior, Edema, Endurance, Motor, Nutrition, Pain, Perception, Safety, Sensory, Skin Integrity  OT Balance, Cognition, Motor, Pain, Safety, Vision  SLP Cognition, Linguistic, Motor, Nutrition, Safety  TR         Basic ADL's: OT Grooming, Bathing, Dressing, Toileting     Advanced  ADL's: OT       Transfers: PT Bed Mobility, Bed to Chair, Car, [employer], Floor  OT Toilet     Locomotion: PT Ambulation, Psychologist, prison and probation services, Stairs     Additional Impairments: OT    SLP Swallowing, Communication, Social Cognition comprehension, expression Problem Solving, Memory, Attention, Awareness  TR      Anticipated Outcomes Item Anticipated Outcome  Self Feeding Sup A  Swallowing  Mod A   Basic self-care  Min-Mod A  Toileting  Min A   Bathroom Transfers Min A  Bowel/Bladder  Mod assist  Transfers  max assist of 1 to and from bed  Locomotion  WC level with family assist  Communication  Mod A  Cognition  Mod A  Pain  < 3  Safety/Judgment  Mod assist   Therapy Plan: PT Intensity: Minimum of 1-2 x/day ,45 to 90 minutes PT Frequency: 5 out of 7 days PT Duration Estimated Length of Stay: 5-6 weeks OT Intensity: Minimum of 1-2 x/day, 45 to 90 minutes OT Frequency: 5 out of 7 days OT Duration/Estimated Length of Stay: 6 weeks SLP Intensity: Minumum of 1-2 x/day, 30 to 90 minutes SLP Frequency: 3 to 5 out of 7 days SLP Duration/Estimated Length of Stay: 28-32 days   Team Interventions: Nursing Interventions Patient/Family Education, Bladder Management,  Bowel Management, Disease Management/Prevention, Medication Management, Skin Care/Wound Management, Cognitive Remediation/Compensation, Dysphagia/Aspiration Precaution Training, Discharge Planning, Psychosocial Support, Pain Management  PT interventions Ambulation/gait training, Discharge planning, Functional mobility training, Psychosocial support, Therapeutic Activities, Visual/perceptual remediation/compensation, Balance/vestibular training, Disease management/prevention, Neuromuscular re-education, Skin care/wound management, Therapeutic Exercise, Wheelchair propulsion/positioning, Cognitive remediation/compensation, DME/adaptive equipment instruction, Pain management, Splinting/orthotics, UE/LE Strength taining/ROM, Community reintegration, Development worker, international aid stimulation, Equities trader education, Museum/gallery curator, UE/LE Coordination activities  OT Interventions Warden/ranger, Discharge planning, Community reintegration, Fish farm manager, Functional electrical stimulation, Functional mobility training, Neuromuscular re-education, Patient/family education, Psychosocial support, Self Care/advanced ADL retraining, Therapeutic Activities, Splinting/orthotics, Therapeutic Exercise, UE/LE Strength taining/ROM, UE/LE Coordination activities  SLP Interventions Cognitive remediation/compensation, Cueing hierarchy, Dysphagia/aspiration precaution training, Environmental controls, Internal/external aids, Speech/Language facilitation, Patient/family education, Functional tasks, Multimodal communication approach  TR Interventions    SW/CM Interventions Discharge Planning, Psychosocial Support, Patient/Family Education   Barriers to Discharge MD  Medical stability  Nursing Decreased caregiver support, Home environment access/layout, Incontinence, Wound Care, Lack of/limited family support, Insurance for SNF coverage, Weight, Medication compliance, Behavior, Nutrition means, New  oxygen 1 level, 4-5 steps, 2 rails. Family to provide 24/7 care.  PT Inaccessible home environment, Decreased caregiver support, Home environment access/layout, Trach, Incontinence, Wound Care, Insurance for SNF coverage, Weight, Medication compliance, Behavior, Nutrition means    OT Inaccessible home environment, Home environment access/layout, Trach, Incontinence    SLP      SW       Team Discharge Planning: Destination: PT-Home ,OT- Home , SLP-Home Projected Follow-up: PT-Home health PT,  OT-  24 hour supervision/assistance, Home health OT, SLP-Home Health SLP, 24 hour supervision/assistance Projected Equipment Needs: PT-Wheelchair (measurements), Wheelchair cushion (measurements), Sliding board, To be determined, OT- To be determined, SLP-To be determined Equipment Details: PT- , OT-  Patient/family involved in discharge planning: PT- Patient, Family member/caregiver,  OT-Family member/caregiver, SLP-Family member/caregiver  MD ELOS: 5 weeks Medical Rehab Prognosis:  Good Assessment: The patient has been admitted for CIR therapies with the diagnosis of severe TBI. The team will be addressing functional mobility, strength, stamina, balance, safety, adaptive techniques and equipment, self-care, bowel and bladder mgt, patient and caregiver education, NMR, swallowing, communication, cognition, pain mgt, community reentry. Goals have been set at mod to max assist across disciplines. Anticipated discharge destination is home with family who are supportive.        See Team Conference Notes for weekly updates to the plan of care

## 2022-05-11 NOTE — Progress Notes (Addendum)
PROGRESS NOTE   Subjective/Complaints: No issues after decannulation. Pt alert and may eye contact with me when I entered the room. Father came in, soon after I arrived  ROS: Limited due to cognitive/behavioral    Objective:   No results found. Recent Labs    05/09/22 0532  WBC 7.0  HGB 13.7  HCT 39.4  PLT 141*   Recent Labs    05/09/22 0532  NA 137  K 3.8  CL 99  CO2 29  GLUCOSE 115*  BUN 19  CREATININE 0.72  CALCIUM 9.5    Intake/Output Summary (Last 24 hours) at 05/11/2022 1055 Last data filed at 05/11/2022 0558 Gross per 24 hour  Intake 1480 ml  Output 2050 ml  Net -570 ml     Pressure Injury 03/31/22 Head Posterior Stage 2 -  Partial thickness loss of dermis presenting as a shallow open injury with a red, pink wound bed without slough. (Active)  03/31/22 1440  Location: Head  Location Orientation: Posterior  Staging: Stage 2 -  Partial thickness loss of dermis presenting as a shallow open injury with a red, pink wound bed without slough.  Wound Description (Comments):   Present on Admission:     Physical Exam: Vital Signs Blood pressure 134/81, pulse (!) 109, temperature 97.9 F (36.6 C), resp. rate 18, SpO2 94 %.  Constitutional: No distress . Vital signs reviewed. HEENT: NCAT, EOMI, oral membranes moist Neck: trach stoma pink/moist Cardiovascular: RRR without murmur. No JVD    Respiratory/Chest: CTA Bilaterally without wheezes or rales. Normal effort    GI/Abdomen: BS +, non-tender, non-distended, PEG site a little excoriated, no drainage.  Ext: no clubbing, cyanosis, or edema Psych: pleasant and cooperative  Musculoskeletal:        General: No swelling, tenderness or deformity.     Right lower leg: No edema.     Left lower leg: No edema.     Comments: Able to fully range arms and legs. No jt abnl or signs of HO.   Skin:    Comments: Abrasion left knee, scalp, foam dressing sacrum and  bilateral heels. Foam dressing heels, sacrum Neurological:     Comments: alert, more eye contact. Head shaking. Engaging with his left arm, squeezed my fingers when I asked him to.   Decreased LT and pain in all 4's. DTR's a little brisk but no resting tone, ?2-3 beats of clonus RLE.  Tremor of LUE Psychiatric:     Comments: Flat, but maintaining good eye contact   Assessment/Plan: 1. Functional deficits which require 3+ hours per day of interdisciplinary therapy in a comprehensive inpatient rehab setting. Physiatrist is providing close team supervision and 24 hour management of active medical problems listed below. Physiatrist and rehab team continue to assess barriers to discharge/monitor patient progress toward functional and medical goals  Care Tool:  Bathing    Body parts bathed by patient: Right arm, Left arm, Chest, Abdomen, Front perineal area, Buttocks, Right upper leg, Left upper leg, Right lower leg, Face, Left lower leg         Bathing assist Assist Level: Total Assistance - Patient < 25%     Upper Body Dressing/Undressing  Upper body dressing   What is the patient wearing?: Pull over shirt    Upper body assist Assist Level: Dependent - Patient 0%    Lower Body Dressing/Undressing Lower body dressing      What is the patient wearing?: Pants     Lower body assist Assist for lower body dressing: Total Assistance - Patient < 25%     Toileting Toileting    Toileting assist Assist for toileting: Dependent - Patient 0%     Transfers Chair/bed transfer  Transfers assist  Chair/bed transfer activity did not occur: Safety/medical concerns  Chair/bed transfer assist level: Dependent - mechanical lift     Locomotion Ambulation   Ambulation assist   Ambulation activity did not occur: Safety/medical concerns          Walk 10 feet activity   Assist  Walk 10 feet activity did not occur: Safety/medical concerns        Walk 50 feet  activity   Assist Walk 50 feet with 2 turns activity did not occur: Safety/medical concerns         Walk 150 feet activity   Assist Walk 150 feet activity did not occur: Safety/medical concerns         Walk 10 feet on uneven surface  activity   Assist Walk 10 feet on uneven surfaces activity did not occur: Safety/medical concerns         Wheelchair     Assist Is the patient using a wheelchair?: Yes Type of Wheelchair: Manual    Wheelchair assist level: Dependent - Patient 0%      Wheelchair 50 feet with 2 turns activity    Assist        Assist Level: Dependent - Patient 0%   Wheelchair 150 feet activity     Assist      Assist Level: Dependent - Patient 0%   Blood pressure 134/81, pulse (!) 109, temperature 97.9 F (36.6 C), resp. rate 18, SpO2 94 %.    Medical Problem List and Plan: 1. 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  DVT right posterior tibial and peroneal veins (03/22/22): -anticoagulation:  Pharmaceutical: Other (comment) Eliquis             -antiplatelet therapy: none 3. Pain: continue Tylenol prn 4. Mood: LCSW to evaluate and provide emotional support             -antipsychotic agents: none 5. Impaired attention: This patient is not capable of making decisions on his own behalf.             -continue amantadine 200mg  bid             -5/22 increase ritalin to 15 mg bid 6. Redness to stoma: monitor daily.  --foam dressing to buttocks/scalp             --continue Bactroban to abrasions 7. Fluids/Electrolytes/Nutrition: Routine Is and Os and follow-up chemistries             --  continue tube feeds Jevity 1.5 at 80  mL/hr             --continue Prosource             --continue regular oral care 9: C1 right transverse process fracture--he's out of collar 10: Left Occipital condyle fracture--out of collar 11: Acute ventilator respiratory failure requiring trach and PEG 3/29             --continue tube feeds. CBGs and SSI             --decannulated yesterday, dc oxygen also.              --continue Robinul 0.4 mg IV q 6 hours--dc today             --continue Robitussin 25 mL q 4 hours 12: Right ear laceration:  healing 14: Tongue laceration: monitor healing 15: Hypertension: continue Lopressor 50 mg BID             -well controlled 5/22 16. Hyperglycemia secondary to tube feeds: continue to monitor    18. Iron deficiency: start daily iron supplement.   LOS: 3 days A FACE TO FACE EVALUATION WAS PERFORMED  Ranelle Oyster 05/11/2022, 10:55 AM

## 2022-05-11 NOTE — Progress Notes (Signed)
Initial Nutrition Assessment  DOCUMENTATION CODES:   Non-severe (moderate) malnutrition in context of acute illness/injury  INTERVENTION:   Transition continuous TF to bolus TF  TF Goal:  2 containers of Jevity 1.5 QID 45 ml ProSource TF QID  Provides: 3000 kcal, 164 grams protein, and 1440 ml free water.   50 ml free water flush before and after each feeding Total free water: 1840 ml   Daily weights   NUTRITION DIAGNOSIS:   Moderate Malnutrition related to acute illness as evidenced by moderate fat depletion, moderate muscle depletion.  GOAL:   Patient will meet greater than or equal to 90% of their needs  MONITOR:   TF tolerance, Weight trends  REASON FOR ASSESSMENT:   New TF    ASSESSMENT:   Pt with recent hospitalization 3/21 - 5/19 for severe TBI after [facility].   Spoke with pt's dad at bedside who shared all of the patient's improvements.  Pt hospitalized for severe TBI 3/21 - 5/19.  TF initialed within 24 hours of admission but from 4/3-4/7 due to leaking PEG pt received very little enteral nutrition.   Pt with muscle depletion from acute illness and inactivity.  Pt remains on continuous TF. Pt sitting in chair at bedside. Pt now meets criteria for moderate acute malnutrition.    Medications reviewed and include: colace, ferrous sulfate, SSI  Labs reviewed:  CBG's: 93-128   NUTRITION - FOCUSED PHYSICAL EXAM:  Flowsheet Row Most Recent Value  Orbital Region Moderate depletion  Upper Arm Region Mild depletion  Thoracic and Lumbar Region Mild depletion  Buccal Region Moderate depletion  Temple Region Moderate depletion  Clavicle Bone Region Moderate depletion  Clavicle and Acromion Bone Region Mild depletion  Scapular Bone Region Unable to assess  Dorsal Hand No depletion  Patellar Region Moderate depletion  Anterior Thigh Region Moderate depletion  Posterior Calf Region Mild depletion  Edema (RD Assessment) None  Hair Reviewed  Eyes Reviewed   Mouth Unable to assess  Skin Reviewed  Nails Reviewed       Diet Order:   Diet Order             Diet NPO time specified  Diet effective now                   EDUCATION NEEDS:   Not appropriate for education at this time  Skin:     Last BM:     Height:   Ht Readings from Last 1 Encounters:  05/11/22 6\' 5"  (1.956 m)    Weight:   Wt Readings from Last 1 Encounters:  05/07/22 106.6 kg    BMI:  Body mass index is 27.87 kg/m.  Estimated Nutritional Needs:   Kcal:  2800-3000  Protein:  150-170 grams  Fluid:  >2.8 L/day  05/09/22., RD, LDN, CNSC See AMiON for contact information

## 2022-05-11 NOTE — Progress Notes (Signed)
Inpatient Rehabilitation  Patient information reviewed and entered into eRehab system by Inaki Vantine M. Kamalani Mastro, M.A., CCC/SLP, PPS Coordinator.  Information including medical coding, functional ability and quality indicators will be reviewed and updated through discharge.    

## 2022-05-11 NOTE — Progress Notes (Signed)
Occupational Therapy TBI Note  Patient Details  Name: Carl Carpenter MRN: [id number] Date of Birth: [birth year]  Today's Date: 05/11/2022 OT Individual Time: 0900-1000 OT Individual Time Calculation (min): 60 min    Short Term Goals: Week 1:  OT Short Term Goal 1 (Week 1): Patient will attend to stimuli presented in central vision for 1 min indicting improved arousal/attention. OT Short Term Goal 2 (Week 1): Patient will maintain static sitting balance at EOB with Mod A for >5 min in prep for ADLs. OT Short Term Goal 3 (Week 1): Patient will complete sit to stand transfer with Max A +2 in prep for ADLs. OT Short Term Goal 4 (Week 1): Patient will follow 1-step verbal commands with 25% accuracy during 3 consecutive treatment sessions in prep for ADLs.  Skilled Therapeutic Interventions/Progress Updates:  Skilled OT intervention completed with focus on LB dressing, activity tolerance, functional transfers and w/c adjustment. Pt received semi-upright in bed, with dad present, pt alert and attending visually to therapist intermittently. Pt remained non-verbal throughout session, with instances of minor moaning and full body tremors that subsided without intervention. No signs of distress or pain noted per facial expressions. Received on 2L via [place] with 99% SPO2 on continuous pulse.  Activation and initiation with L side > R, with pt able to follow one step commands with multimodal cueing during dressing about 25% of the time. +2 total A for bed level donning of pants rolling R<>L with facilitation provided at knees and BUE on opposite bed rail for gravity assist however still total A for rolling both ways and 2nd person donning pants over hips. Transferred via maximove with +2 total A needed for placement of sling, and then dependent transfer to [place] chair. Total A needed for shifting pelvis into functional seated position. Transported dependently <> therapy gym with pt connected to supplemental oxygen.    Facial sweating noted with vitals assessed (dyn), tilted in w/c after 10+ mins upright: 129/77 BP, 97% SPO2 on 2L via [place], 99 HR. Therapist adjusted head rest on TIS chair as pt presented with R lateral/anterior cervical flexion, with therapist able to achieve secure neutral alignment after adjustments.  Retrieved warm wash cloth to assess initiation with face washing, with hand over hand assist and elbow stabilization in LUE utilized, pt was able to actively bring hand to face, but total A needed for actual wiping task. Of note- skin breakdown noted on bilateral hands with total A for washing hands, and pt preferring to grip folded towel in L hand. Pt was left upright in TIS chair, on 2L via [place], with family present therefore belt left off as prompt SLP coming soon, and all immediate needs met at end of session.  Therapy Documentation Precautions:  Precautions Precautions: Fall Precaution Comments: capped trach, PEG, binder Restrictions Weight Bearing Restrictions: No  Agitated Behavior Scale: TBI Observation Details Observation Environment: pt's room Start of observation period - Date: 05/11/22 Start of observation period - Time: 0900 End of observation period - Date: 05/11/22 End of observation period - Time: 1000 Agitated Behavior Scale (DO NOT LEAVE BLANKS) Short attention span, easy distractibility, inability to concentrate: Present to a slight degree Impulsive, impatient, low tolerance for pain or frustration: Absent Uncooperative, resistant to care, demanding: Absent Violent and/or threatening violence toward people or property: Absent Explosive and/or unpredictable anger: Absent Rocking, rubbing, moaning, or other self-stimulating behavior: Present to a slight degree Pulling at tubes, restraints, etc.: Absent Wandering from treatment areas: Absent Restlessness, pacing, excessive  movement: Absent Repetitive behaviors, motor, and/or verbal: Present to a slight degree Rapid,  loud, or excessive talking: Absent Sudden changes of mood: Absent Easily initiated or excessive crying and/or laughter: Absent Self-abusiveness, physical and/or verbal: Absent Agitated behavior scale total score: 17     Therapy/Group: Individual Therapy  Britton Perkinson E Adaliz Dobis 05/11/2022, 10:05 AM

## 2022-05-11 NOTE — Progress Notes (Signed)
Inpatient Rehabilitation Care Coordinator Assessment and Plan Patient Details  Name: Carl Carpenter MRN: [id number] Date of Birth: 1986-02-15  Today's Date: 05/11/2022  Hospital Problems: Principal Problem:   TBI (traumatic brain injury) [facility]) Active Problems:   Malnutrition of moderate degree  Past Medical History: History reviewed. No pertinent past medical history. Past Surgical History:  Past Surgical History:  Procedure Laterality Date   ESOPHAGOGASTRODUODENOSCOPY ENDOSCOPY N/A 03/18/2022   Procedure: ESOPHAGOGASTRODUODENOSCOPY ENDOSCOPY;  Surgeon: Diamantina Monks, MD;  Location: [facility] OR;  Service: General;  Laterality: N/A;   IR REPLC GASTRO/COLONIC TUBE PERCUT W/FLUORO  03/24/2022   PEG PLACEMENT N/A 03/18/2022   Procedure: LAPAROSCOPIC ASSITED PERCUTANEOUS GASTROSTOMY (PEG) PLACEMENT;  Surgeon: Diamantina Monks, MD;  Location: [facility] OR;  Service: General;  Laterality: N/A;   TRACHEOSTOMY TUBE PLACEMENT N/A 03/18/2022   Procedure: TRACHEOSTOMY;  Surgeon: Diamantina Monks, MD;  Location: [facility] OR;  Service: General;  Laterality: N/A;   Social History:  reports that he has been smoking cigarettes. He has been smoking an average of 1.5 packs per day. He has never used smokeless tobacco. No history on file for alcohol use and drug use.  Family / Support Systems Marital Status: Single Patient Roles: Partner, Parent Spouse/Significant Other: Radiation protection practitioner (s/o) Children: Blended family. Partner has 3 children, and he has 70 yr old dtr. They have a 3 y.o. son together. Other Supports: parents, grandparents, extended family Anticipated Caregiver: partner Carl Carpenter Ability/Limitations of Caregiver: None reported Caregiver Availability: 24/7 Family Dynamics: Pt lives in between homes: his and partners home.  Social History Preferred language: English Religion:  Cultural Background: Pt has a work hx in Market researcher, and Administrator, sports: high school Primary school teacher - How often do you  need to have someone help you when you read instructions, pamphlets, or other written material from your doctor or pharmacy?: Never Writes: Yes Employment Status: Employed Name of Employer: [employer] of Employment: 6 (years) Return to Work Plans: TBD. Pt employer recently established insurance for company 2 weeks prior to this incident. No other benefits available. Legal History/Current Legal Issues: Denies Guardian/Conservator: N/A   Abuse/Neglect Abuse/Neglect Assessment Can Be Completed: Unable to assess, patient is non-responsive or altered mental status  Patient response to: Social Isolation - How often do you feel lonely or isolated from those around you?: Patient unable to respond  Emotional Status Pt's affect, behavior and adjustment status: Pt is not able to communicate at this time. Recent Psychosocial Issues: Pt partner denies Psychiatric History: Pt partner denies Substance Abuse History: Pt partner states he smokes cigarettes 1-2ppd. Reports rare eoth use.  Patient / Family Perceptions, Expectations & Goals Pt/Family understanding of illness & functional limitations: Pt family has a general understanding of his care needs Premorbid pt/family roles/activities: Independent Anticipated changes in roles/activities/participation: Assistance with ADLs/IADLs Pt/family expectations/goals: Pt partner's goal is for him to make as much progress as he can.  Community Resources [employer]: None Premorbid Home Care/DME Agencies: None Transportation available at discharge: TBD Is the patient able to respond to transportation needs?: Yes In the past 12 months, has lack of transportation kept you from medical appointments or from getting medications?: No In the past 12 months, has lack of transportation kept you from meetings, work, or from getting things needed for daily living?: No  Discharge Planning Living Arrangements: Spouse/significant other,  Children, Parent, Other relatives Support Systems: Spouse/significant other, Children, Parent, Other relatives, Friends/neighbors Type of Residence: Private residence Insurance Resources: Customer service manager Resources:  Employment Surveyor, quantity Screen Referred: No Living Expenses: Psychologist, sport and exercise Management: Patient Does the patient have any problems obtaining your medications?: No Home Management: Pt managed all homecare needs Patient/Family Preliminary Plans: TBD Care Coordinator Barriers to Discharge: Insurance for SNF coverage, Other (comments), New oxygen, Nutrition means Care Coordinator Barriers to Discharge Comments: Pt is uninsured. Pt is currenlty on o2 and on TF (pump). Care Coordinator Anticipated Follow Up Needs: HH/OP Expected length of stay: 5-6 weeks  Clinical Impression SW completed assessment with pt partner Carl Carpenter. Pt is not a Cytogeneticist. No HCPOA but contact his parents Carl Carpenter (mother) and Carl Carpenter (father). No DME. Pt will have a lot of family support at discharge. Carl Carpenter reports pt mother initiated Medicaid application and social security application as well.   Carl Carpenter A Carl Carpenter 05/11/2022, 4:14 PM

## 2022-05-12 ENCOUNTER — Other Ambulatory Visit (HOSPITAL_COMMUNITY): Payer: Self-pay

## 2022-05-12 DIAGNOSIS — G969 Disorder of central nervous system, unspecified: Secondary | ICD-10-CM

## 2022-05-12 DIAGNOSIS — R251 Tremor, unspecified: Secondary | ICD-10-CM

## 2022-05-12 LAB — GLUCOSE, CAPILLARY
Glucose-Capillary: 102 mg/dL — ABNORMAL HIGH (ref 70–99)
Glucose-Capillary: 119 mg/dL — ABNORMAL HIGH (ref 70–99)
Glucose-Capillary: 107 mg/dL — ABNORMAL HIGH (ref 70–99)
Glucose-Capillary: 107 mg/dL — ABNORMAL HIGH (ref 70–99)
Glucose-Capillary: 116 mg/dL — ABNORMAL HIGH (ref 70–99)

## 2022-05-12 MED ORDER — PROPRANOLOL HCL 20 MG/5ML PO SOLN
10.0000 mg | Freq: Three times a day (TID) | ORAL | Status: DC
  Administered 2022-05-12 – 2022-05-28 (×46): 10 mg
  Filled 2022-05-12 (×50): qty 2.5

## 2022-05-12 NOTE — Progress Notes (Addendum)
Patient ID: Carl Carpenter, male   DOB: [birth year], 36 y.o.   MRN: [id number]  SW met with pt s/o Carl Carpenter to provide updates from team conference, and d/c date 4+weeks. She reports he was approved for Medicaid in [place] will confirm. SW shared there will continue to be weekly follow-up.   Loralee Pacas, MSW, [place] Office: [phone] Cell: [phone] Fax: [phone]

## 2022-05-12 NOTE — Progress Notes (Signed)
Speech Language Pathology TBI Note  Patient Details  Name: Carl Carpenter MRN: [id number] Date of Birth: 12/29/1985  Today's Date: 05/12/2022 SLP Individual Time: 0815-0915 SLP Individual Time Calculation (min): 60 min  Short Term Goals: Week 1: SLP Short Term Goal 1 (Week 1): Pt will consume therapeutic trials of 1/2 teaspoons of thin liquids with minimal overt s/s of aspiration over 3 consecutive sessions and max assist use of swallowing precautions prior to MBS or FEES SLP Short Term Goal 2 (Week 1): Pt will vocalize on command in 25% of opportunities with max assist multimodal cues. SLP Short Term Goal 3 (Week 1): Pt will answer basic yes/no questions using written choices on a white board and either eye gaze or gesturing in 25% of opportunities with max assist multimodal cues. SLP Short Term Goal 4 (Week 1): Pt will follow 1 step  commands in >25% of opportunities with max assist multimodal cues. SLP Short Term Goal 5 (Week 1): Pt will focus his attention on a targeted stimulus for 30 seconds with max assist multimodal cues.  Skilled Therapeutic Interventions: Skilled treatment session focused on cognitive goals. Upon arrival, patient was awake in bed. Patient appeared uncomfortable with a consistent wet vocal quality and secretions in his upper airway that appeared difficult to expel and move up into his oral cavity. SLP facilitated session by transferring the patient to the wheelchair via the [facility] with total +2 assist for safety. Patient required Max hand over hand assist to initiate mobility during bed mobility and for initiation of basic self-care tasks. While upright in the wheelchair, patient with consistent coughing. Despite a strong cough, patient was unable to expel secretions into his oral cavity. Suspect patient's constant coughing increased patient's distractibility as he was unable to utilize the yes/no communication boards today efficiently.  Trials were not administered today  due to patient's constant coughing and decreased attention to tasks. Patient left upright in wheelchair with girlfriend present. Continue with current plan of care.      Pain No/Denies Pain   Agitated Behavior Scale: TBI Observation Details Observation Environment: CIR Start of observation period - Date: 05/12/22 Start of observation period - Time: 0915 End of observation period - Date: 05/12/22 End of observation period - Time: 1015 Agitated Behavior Scale (DO NOT LEAVE BLANKS) Short attention span, easy distractibility, inability to concentrate: Present to a slight degree Impulsive, impatient, low tolerance for pain or frustration: Absent Uncooperative, resistant to care, demanding: Absent Violent and/or threatening violence toward people or property: Absent Explosive and/or unpredictable anger: Absent Rocking, rubbing, moaning, or other self-stimulating behavior: Present to a slight degree Pulling at tubes, restraints, etc.: Absent Wandering from treatment areas: Absent Restlessness, pacing, excessive movement: Absent Repetitive behaviors, motor, and/or verbal: Absent Rapid, loud, or excessive talking: Absent Sudden changes of mood: Absent Easily initiated or excessive crying and/or laughter: Absent Self-abusiveness, physical and/or verbal: Absent Agitated behavior scale total score: 16  Therapy/Group: Individual Therapy  Rosaleah Person 05/12/2022, 11:49 AM
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
## 2022-05-12 NOTE — Progress Notes (Signed)
Physical Therapy TBI Note  Patient Details  Name: Carl Carpenter MRN: [id number] Date of Birth: 1986/03/29  Today's Date: 05/12/2022 PT Individual Time: 1450-1600 PT Individual Time Calculation (min): 70 min   Short Term Goals: Week 1:  PT Short Term Goal 1 (Week 1): Pt will tolerate sitting in WC 3 hours between therapy PT Short Term Goal 2 (Week 1): Pt will sit EOB with max assist of 1. PT Short Term Goal 3 (Week 1): Pt will attend to therapy task for ~5 mintues with max cues PT Short Term Goal 4 (Week 1): Pt will remain aroused throughout entire PT treatment session  Skilled Therapeutic Interventions/Progress Updates:     Pt received supine in bed. No indication of pain. Supine to sit with totalA +2 and cues for body mechanics and sequencing. Pt requires modA +1 for static sitting balance at EOB with tendency to push posteriorly with L hand. Pt performs slideboard transfer to [facility] with totalA +2 and cues for hand placement. WC transport to gym. Slideboard transfer to mat table with same assist and cues. Pt participates in sitting balance and attention activity, with exercise ball placed behind pt for posterior support and rehab tech providing modA overall for stability. Pt cued to attend to various stimuli in visual field and appears to scan objects visually, but with little active voluntary movement in response to PT commands or cues. Pt does appear to attempt to lift head when cued but frequently requires assistance for head control. Pt positioned in forward flexed posture with upper extremities resting on knees to acclimate pt to functional positioning and ot provide WB through bilateral upper extremities with tactile feedback. Pt has palor of face and visibly perspiring during session. BP taken at 133/89. Pt assisted into standing frame with +3 assistance and BP taken in standing at 128/88. Pt noted to have bowel movement in standing. Returned to mat and pt performs slideboard from mat>WC>bed  with totalA +2. Pt performs bilateral rolling with totalA to perform pericare. Pt left with RN and NA present.   Therapy Documentation Precautions:  Precautions Precautions: Fall Precaution Comments: capped trach, PEG, binder Restrictions Weight Bearing Restrictions: No  Agitated Behavior Scale: TBI Observation Details Observation Environment: CIR Start of observation period - Date: 05/12/22 Start of observation period - Time: 0940 End of observation period - Date: 05/12/22 End of observation period - Time: 1030 Agitated Behavior Scale (DO NOT LEAVE BLANKS) Short attention span, easy distractibility, inability to concentrate: Present to a slight degree Impulsive, impatient, low tolerance for pain or frustration: Absent Uncooperative, resistant to care, demanding: Absent Violent and/or threatening violence toward people or property: Absent Explosive and/or unpredictable anger: Absent Rocking, rubbing, moaning, or other self-stimulating behavior: Absent Pulling at tubes, restraints, etc.: Absent Wandering from treatment areas: Absent Restlessness, pacing, excessive movement: Absent Repetitive behaviors, motor, and/or verbal: Absent Rapid, loud, or excessive talking: Absent Sudden changes of mood: Absent Easily initiated or excessive crying and/or laughter: Absent Self-abusiveness, physical and/or verbal: Absent Agitated behavior scale total score: 15  Therapy/Group: Individual Therapy  [name], PT, DPT 05/12/2022, 5:12 PM

## 2022-05-12 NOTE — Progress Notes (Signed)
Occupational Therapy TBI Note  Patient Details  Name: Carl Carpenter MRN: [id number] Date of Birth: February 02, 1986  Today's Date: 05/12/2022 OT Individual Time: 2010-0712 OT Individual Time Calculation (min): 48 min    Short Term Goals: Week 1:  OT Short Term Goal 1 (Week 1): Patient will attend to stimuli presented in central vision for 1 min indicting improved arousal/attention. OT Short Term Goal 2 (Week 1): Patient will maintain static sitting balance at EOB with Mod A for >5 min in prep for ADLs. OT Short Term Goal 3 (Week 1): Patient will complete sit to stand transfer with Max A +2 in prep for ADLs. OT Short Term Goal 4 (Week 1): Patient will follow 1-step verbal commands with 25% accuracy during 3 consecutive treatment sessions in prep for ADLs.  Skilled Therapeutic Interventions/Progress Updates:    Pt sitting up in the TIS w/c with his fiance Carl Carpenter present. Session focused on focused attention, simple command following, simple ADLs, and functional transfers/sitting balance. He was able to track OT in L quadrant consistently, less consistent in the R. Improved initiation in the room and command following overall compared to busier gym d/t obvious attention deficits. He was able to initiate oral care with appropriate motor plan, using the non dominant LUE. He had difficulty terminating motor plans and required heavy assist to remove toothbrush from his hand. Fiance also reported he initiated brushing his hair earlier. Max facilitation for bimanual hand washing at the sink to incorporate (hemi?) R UE. He required max HOH to complete. He was taken to the therapy gym to use therapy mat, unfortunately worse attention d/t busy environment. He required total A +2-3 for a slideboard transfer to the mat. He required total A for sitting balance. Improved head control. Worked on volitional reaching/command following. Pt able to grasp fishing pole (familiar and preferred object for pt) but then he  required total A to remove grasp. He returned to the w/c with total A +2-3 via slideboard. Pt was left sitting up in the TIS w/c with all needs met, chair alarm set, and call bell within reach.    Therapy Documentation Precautions:  Precautions Precautions: Fall Precaution Comments: capped trach, PEG, binder Restrictions Weight Bearing Restrictions: No  Agitated Behavior Scale: TBI Observation Details Observation Environment: CIR Start of observation period - Date: 05/12/22 Start of observation period - Time: 0940 End of observation period - Date: 05/12/22 End of observation period - Time: 1030 Agitated Behavior Scale (DO NOT LEAVE BLANKS) Short attention span, easy distractibility, inability to concentrate: Present to a slight degree Impulsive, impatient, low tolerance for pain or frustration: Absent Uncooperative, resistant to care, demanding: Absent Violent and/or threatening violence toward people or property: Absent Explosive and/or unpredictable anger: Absent Rocking, rubbing, moaning, or other self-stimulating behavior: Absent Pulling at tubes, restraints, etc.: Absent Wandering from treatment areas: Absent Restlessness, pacing, excessive movement: Absent Repetitive behaviors, motor, and/or verbal: Absent Rapid, loud, or excessive talking: Absent Sudden changes of mood: Absent Easily initiated or excessive crying and/or laughter: Absent Self-abusiveness, physical and/or verbal: Absent Agitated behavior scale total score: 15    Therapy/Group: Individual Therapy  Carl Carpenter 05/12/2022, 6:17 AM
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
## 2022-05-12 NOTE — Progress Notes (Signed)
Orthopedic Tech Progress Note Patient Details:  Carl Carpenter 1986/10/26 [id number]  Secretary called requesting an Abdominal Binder large  Ortho Devices Type of Ortho Device: Abdominal binder Ortho Device/Splint Location: stomach Ortho Device/Splint Interventions: Ordered   Post Interventions Patient Tolerated: Well Instructions Provided: [facility] [place] 05/12/2022, 5:20 PM

## 2022-05-13 ENCOUNTER — Other Ambulatory Visit (HOSPITAL_COMMUNITY): Payer: Self-pay

## 2022-05-13 DIAGNOSIS — Z515 Encounter for palliative care: Secondary | ICD-10-CM

## 2022-05-13 DIAGNOSIS — Z7189 Other specified counseling: Secondary | ICD-10-CM

## 2022-05-13 LAB — GLUCOSE, CAPILLARY
Glucose-Capillary: 105 mg/dL — ABNORMAL HIGH (ref 70–99)
Glucose-Capillary: 106 mg/dL — ABNORMAL HIGH (ref 70–99)
Glucose-Capillary: 111 mg/dL — ABNORMAL HIGH (ref 70–99)
Glucose-Capillary: 117 mg/dL — ABNORMAL HIGH (ref 70–99)
Glucose-Capillary: 94 mg/dL (ref 70–99)
Glucose-Capillary: 98 mg/dL (ref 70–99)

## 2022-05-13 MED ORDER — METHYLPHENIDATE HCL 5 MG PO TABS
15.0000 mg | ORAL_TABLET | Freq: Two times a day (BID) | ORAL | Status: DC
  Administered 2022-05-13 – 2022-05-15 (×4): 15 mg
  Filled 2022-05-13 (×4): qty 3

## 2022-05-13 MED ORDER — FLUCONAZOLE 40 MG/ML PO SUSR
100.0000 mg | Freq: Every day | ORAL | Status: DC
  Administered 2022-05-13: 100 mg via ORAL
  Filled 2022-05-13 (×3): qty 2.5

## 2022-05-13 MED ORDER — ALBUTEROL SULFATE (2.5 MG/3ML) 0.083% IN NEBU
2.5000 mg | INHALATION_SOLUTION | Freq: Two times a day (BID) | RESPIRATORY_TRACT | Status: DC
  Administered 2022-05-13 – 2022-05-16 (×6): 2.5 mg via RESPIRATORY_TRACT
  Filled 2022-05-13 (×6): qty 3

## 2022-05-13 MED ORDER — GLYCOPYRROLATE 1 MG PO TABS
1.0000 mg | ORAL_TABLET | Freq: Three times a day (TID) | ORAL | Status: DC
  Administered 2022-05-13 – 2022-06-02 (×58): 1 mg
  Filled 2022-05-13 (×61): qty 1

## 2022-05-13 NOTE — Progress Notes (Signed)
Occupational Therapy TBI Note  Patient Details  Name: Carl Carpenter MRN: [id number] Date of Birth: Jun 03, 1986  Today's Date: 05/13/2022 OT Individual Time: 7169-6789 OT Individual Time Calculation (min): 70 min    Short Term Goals: Week 1:  OT Short Term Goal 1 (Week 1): Patient will attend to stimuli presented in central vision for 1 min indicting improved arousal/attention. OT Short Term Goal 2 (Week 1): Patient will maintain static sitting balance at EOB with Mod A for >5 min in prep for ADLs. OT Short Term Goal 3 (Week 1): Patient will complete sit to stand transfer with Max A +2 in prep for ADLs. OT Short Term Goal 4 (Week 1): Patient will follow 1-step verbal commands with 25% accuracy during 3 consecutive treatment sessions in prep for ADLs.  Skilled Therapeutic Interventions/Progress Updates:    Pt received supine with his father present. Session focused on task initiation, focused attention, functional participation in ADLs, and transfers. He required total A +2 for bed mobiltiy R and L with no initiation despite multimodal cueing. Brief changed for hygiene. Shorts donned total A as well as socks and shoes. Max +3 transfer to EOB. Max +3 slideboard to the TIS w/c. Once in the w/c pt engaged in simple ADLs at the sink to work on initiation, focused attention. He was unable to complete any meaningful initiation, with multimodal cueing and visual cues of mirror. He completed UB bathing with total A+2 with HOH assist. He required total A +2 to don a shirt. Assisted pt with washing his hair in the sink, working on head control with headrest removed. Bimanual hand hygiene in the sink with total HOH. Pt still with perseverative grasp in the LUE at times. Extra time provided throughout session to allow pt to initiate movement. Pt was left sitting up in the TIS w/c with all needs met- his father present. Provided edu to his father on tilting w/c every hour for pressure relief.    Therapy  Documentation Precautions:  Precautions Precautions: Fall Precaution Comments: capped trach, PEG, binder Restrictions Weight Bearing Restrictions: No  Agitated Behavior Scale: TBI Observation Details Observation Environment: CIR Start of observation period - Date: 05/13/22 Start of observation period - Time: 1315 End of observation period - Date: 05/13/22 End of observation period - Time: 1415 Agitated Behavior Scale (DO NOT LEAVE BLANKS) Short attention span, easy distractibility, inability to concentrate: Present to a slight degree Impulsive, impatient, low tolerance for pain or frustration: Absent Uncooperative, resistant to care, demanding: Absent Violent and/or threatening violence toward people or property: Absent Explosive and/or unpredictable anger: Absent Rocking, rubbing, moaning, or other self-stimulating behavior: Present to a slight degree Pulling at tubes, restraints, etc.: Absent Wandering from treatment areas: Absent Restlessness, pacing, excessive movement: Absent Repetitive behaviors, motor, and/or verbal: Absent Rapid, loud, or excessive talking: Absent Sudden changes of mood: Absent Easily initiated or excessive crying and/or laughter: Absent Self-abusiveness, physical and/or verbal: Absent Agitated behavior scale total score: 16    Therapy/Group: Individual Therapy  [name] 05/13/2022, 3:54 PM
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
## 2022-05-13 NOTE — Progress Notes (Signed)
Speech Language Pathology TBI Note  Patient Details  Name: Carl Carpenter MRN: [id number] Date of Birth: 1986/09/14  Today's Date: 05/13/2022 SLP Individual Time: 1315-1415 SLP Individual Time Calculation (min): 60 min  Short Term Goals: Week 1: SLP Short Term Goal 1 (Week 1): Pt will consume therapeutic trials of 1/2 teaspoons of thin liquids with minimal overt s/s of aspiration over 3 consecutive sessions and max assist use of swallowing precautions prior to MBS or FEES SLP Short Term Goal 2 (Week 1): Pt will vocalize on command in 25% of opportunities with max assist multimodal cues. SLP Short Term Goal 3 (Week 1): Pt will answer basic yes/no questions using written choices on a white board and either eye gaze or gesturing in 25% of opportunities with max assist multimodal cues. SLP Short Term Goal 4 (Week 1): Pt will follow 1 step  commands in >25% of opportunities with max assist multimodal cues. SLP Short Term Goal 5 (Week 1): Pt will focus his attention on a targeted stimulus for 30 seconds with max assist multimodal cues.  Skilled Therapeutic Interventions: Skilled treatment session focused on cognitive goals. Upon arrival, patient was lethargic while upright in the wheelchair. Patient's eyes remained closed for the first 10 minutes of the session but patient eventually awakened with multiple repositions. Patient appeared sweaty but only on the upper 1/2 of his head. Vitals taken and WFL. SLP facilitated session by providing hand over hand assist for following commands. Hand over hand assist were also needed for initiation in attempting to identify functional items from a field of 2 or for utilization of yes/no communication board to express wants/needs. Patient unable to complete tasks but focused attention to name/tactile stimulation in 50% of opportunities. Patient transferred back to bed via the Slideboard with +2 assist for safety due to incontinent bowel movement. Total A for peri care.  Suspect patient's function and possibly sweating may have been impacted by his need to use the bathroom. Patient's girlfriend present and educated on the importance of providing a low stimulation environment in between therapy sessions to allow the patient's brain to rest. She verbalized understanding. Of note, throughout session, patient with consistent wet cough that was non-productive. Physician aware. Patient left upright in bed with family present. Continue with current plan of care.      Pain No indications of pain   Agitated Behavior Scale: TBI Observation Details Observation Environment: CIR Start of observation period - Date: 05/13/22 Start of observation period - Time: 1315 End of observation period - Date: 05/13/22 End of observation period - Time: 1415 Agitated Behavior Scale (DO NOT LEAVE BLANKS) Short attention span, easy distractibility, inability to concentrate: Present to a slight degree Impulsive, impatient, low tolerance for pain or frustration: Absent Uncooperative, resistant to care, demanding: Absent Violent and/or threatening violence toward people or property: Absent Explosive and/or unpredictable anger: Absent Rocking, rubbing, moaning, or other self-stimulating behavior: Present to a slight degree Pulling at tubes, restraints, etc.: Absent Wandering from treatment areas: Absent Restlessness, pacing, excessive movement: Absent Repetitive behaviors, motor, and/or verbal: Absent Rapid, loud, or excessive talking: Absent Sudden changes of mood: Absent Easily initiated or excessive crying and/or laughter: Absent Self-abusiveness, physical and/or verbal: Absent Agitated behavior scale total score: 16  Therapy/Group: Individual Therapy  Elyssia Strausser 05/13/2022, 2:41 PM

## 2022-05-13 NOTE — Progress Notes (Signed)
Physical Therapy Session Note  Patient Details  Name: Carl Carpenter MRN: [id number] Date of Birth: 01/09/1986  Today's Date: 05/13/2022 PT Individual Time: 1045-1200 PT Individual Time Calculation (min): 75 min   Short Term Goals: Week 1:  PT Short Term Goal 1 (Week 1): Pt will tolerate sitting in WC 3 hours between therapy PT Short Term Goal 2 (Week 1): Pt will sit EOB with max assist of 1. PT Short Term Goal 3 (Week 1): Pt will attend to therapy task for ~5 mintues with max cues PT Short Term Goal 4 (Week 1): Pt will remain aroused throughout entire PT treatment session  Skilled Therapeutic Interventions/Progress Updates: Pt presents tilted back in TIS w/c, father present and agreeable to therapy.  Pt lethargic initially and requires verbal stim as well as father opening his eyes.  Pt asked if pain and attempted use of "yes/no" communication boards but w/out response other than eye fixation on "no".  Pt tilted to neutral position in TIS w/ B feet on ground.  Pt shown pictures and requested to reach for person asked for, but pt not using LUE even w/ increased time.  Pt does track pictures side to side when moved, L>R.  BP monitored throughout session initially 140/83 (MAP 99) and then during session at 133/96 (MAP 1060 and at conclusion 128/92 (MAP 104), O2 on RA decreased to 90% during activity but then returns to 98%.  Pt hat brought over and placed in L hand, pt required assist to raise to head, but moves head to receive hat.  Pt performed multiple forward sitting pulling on PT hands and father assisting w/ maintaining midline.  Pt able to raise head to neutral when falls forward 70% of time w/ cueing.  Pt lists to left.  Pt suctioned multiple times during session 2/2 increased coughing and secretions.  Pt remained sitting in TIS tilted back and father present.     Therapy Documentation Precautions:  Precautions Precautions: Fall Precaution Comments: capped trach, PEG,  binder Restrictions Weight Bearing Restrictions: No General:   Vital Signs:   Pain:does not appear to have pain. Pain Assessment Faces Pain Scale: Hurts a little bit Mobility:      Therapy/Group: Individual Therapy  [name] 05/13/2022, 12:01 PM

## 2022-05-13 NOTE — Progress Notes (Signed)
Patient ID: Carl Carpenter, male   DOB: 06/25/1986, 36 y.o.   MRN: [id number]  SW received updates from Joaine/Sup with [employer] who reported will follow-up with pt Medicaid worker to determine status.    Cecile Sheerer, MSW, LCSWA Office: [phone] Cell: [phone] Fax: [phone]

## 2022-05-13 NOTE — Progress Notes (Signed)
Slept well last night. Easily arousal. Sensations in extremities intact. Peg tube feed with water flush as ordered. Two hourly mouth care done including position changes. Cough episodes subsiding. CBG checks as ordered. Father stayed overnight and was very helpful.Safety maintained at all times.
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
## 2022-05-13 NOTE — Progress Notes (Signed)
Patient has increased cough and congestion. PRN Albuterol given as ordered. PRN Tylenol given for increased facial grimacing and moaning, effective. Oral care completed as ordered. Family at bedside

## 2022-05-14 LAB — GLUCOSE, CAPILLARY
Glucose-Capillary: 101 mg/dL — ABNORMAL HIGH (ref 70–99)
Glucose-Capillary: 101 mg/dL — ABNORMAL HIGH (ref 70–99)
Glucose-Capillary: 132 mg/dL — ABNORMAL HIGH (ref 70–99)
Glucose-Capillary: 158 mg/dL — ABNORMAL HIGH (ref 70–99)
Glucose-Capillary: 94 mg/dL (ref 70–99)
Glucose-Capillary: 99 mg/dL (ref 70–99)

## 2022-05-14 MED ORDER — FLUCONAZOLE 40 MG/ML PO SUSR: 100.0000 mg | Freq: Every day | ORAL | Status: DC

## 2022-05-14 MED ORDER — FLUCONAZOLE 40 MG/ML PO SUSR
100.0000 mg | Freq: Every day | ORAL | Status: AC
  Administered 2022-05-14: 100 mg
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
## 2022-05-14 NOTE — Progress Notes (Signed)
Occupational Therapy TBI Note  Patient Details  Name: Carl Carpenter MRN: [id number] Date of Birth: [birth year]  Today's Date: 05/14/2022 OT Individual Time: 0820-0930 OT Individual Time Calculation (min): 70 min    Short Term Goals: Week 1:  OT Short Term Goal 1 (Week 1): Patient will attend to stimuli presented in central vision for 1 min indicting improved arousal/attention. OT Short Term Goal 2 (Week 1): Patient will maintain static sitting balance at EOB with Mod A for >5 min in prep for ADLs. OT Short Term Goal 3 (Week 1): Patient will complete sit to stand transfer with Max A +2 in prep for ADLs. OT Short Term Goal 4 (Week 1): Patient will follow 1-step verbal commands with 25% accuracy during 3 consecutive treatment sessions in prep for ADLs.  Skilled Therapeutic Interventions/Progress Updates:    1:1. Pt received in bed asleep requiring increased time to arouse. Focus of session on initiation, arousal, visual fixation, making a choice, head control and functional transfers. Overall pt requires total A +2 for all bed mobility, transfers via SB and ADL components. Pt able to initiate opening mouth when toothbrush touched to lips with multimodal cuing. Pt able to grasp toothbrush with firm gross grasp and begin brushing motion, however unable to terminate grasp. Pt given multiple opportunities to create choice with clothing items or visually fixate on items with pt maintaining gaze on objects with L gaze preference. Removed headrest for head control trials with pt unable to initiate any righting reactions with head falling back to full cervical extension. Pt most successful initiation with reaching for basketball with BOTH hands! And pulling ball to chest with OT facilitating LUE. Pt then able to initiate reach with BUE to pass ball to +2 with MAX facilitation of LUE. Pt able to initiate removing BUE from ball all with MAX multimodal cuing for visual attention prior to movements. Returned to  room tilted back into TIS with girlfriend present.   Therapy Documentation Precautions:  Precautions Precautions: Fall Precaution Comments: capped trach, PEG, binder Restrictions Weight Bearing Restrictions: No    Agitated Behavior Scale: TBI  Observation Details Observation Environment: CIR Start of observation period - Date: 05/14/22 Start of observation period - Time: 0815 End of observation period - Date: 05/14/22 End of observation period - Time: 0930 Agitated Behavior Scale (DO NOT LEAVE BLANKS) Short attention span, easy distractibility, inability to concentrate: Present to a slight degree Impulsive, impatient, low tolerance for pain or frustration: Absent Uncooperative, resistant to care, demanding: Absent Violent and/or threatening violence toward people or property: Absent Explosive and/or unpredictable anger: Absent Rocking, rubbing, moaning, or other self-stimulating behavior: Absent Pulling at tubes, restraints, etc.: Absent Wandering from treatment areas: Absent Restlessness, pacing, excessive movement: Absent Repetitive behaviors, motor, and/or verbal: Absent Rapid, loud, or excessive talking: Absent Sudden changes of mood: Absent Easily initiated or excessive crying and/or laughter: Absent Self-abusiveness, physical and/or verbal: Absent Agitated behavior scale total score: 15    Therapy/Group: Individual Therapy  Shon Hale 05/14/2022, 12:14 PM

## 2022-05-14 NOTE — Progress Notes (Signed)
Occupational Therapy Session Note  Patient Details  Name: Carl Carpenter MRN: [id number] Date of Birth: 05/25/1986  Today's Date: 05/14/2022 OT Individual Time: 1330-1415 OT Individual Time Calculation (min): 45 min    Short Term Goals: Week 1:  OT Short Term Goal 1 (Week 1): Patient will attend to stimuli presented in central vision for 1 min indicting improved arousal/attention. OT Short Term Goal 2 (Week 1): Patient will maintain static sitting balance at EOB with Mod A for >5 min in prep for ADLs. OT Short Term Goal 3 (Week 1): Patient will complete sit to stand transfer with Max A +2 in prep for ADLs. OT Short Term Goal 4 (Week 1): Patient will follow 1-step verbal commands with 25% accuracy during 3 consecutive treatment sessions in prep for ADLs.  Skilled Therapeutic Interventions/Progress Updates:    Pt seen with PT, see PT note for beginning of session. Session focus on head/trunk control, tolerance to upright, initiation, functional reach, visual scanning and visual fixation. Pt total A +2 transfer to EOM. Pt requires +2 to position standing frame sling under hips and rise to stand. +3 available to provide stimulus to reach for. After standing ~74min working on relaxing LUE and head control with mirror for visual feedback, pt is able to initiate reaching for cone and voluntarily brings it up to face to look inside and then turn over to place on table. Pt able to repeat with multimodal cuing to turn head and eyes to look left and then grab cone with mod-max facilitation of reach and then min to place on top of previous cone. Pt beginning to look pale after ~20 min total of standing and returns to seated, TIS and back to bed with +2 total SB with pt needing to place LUE on chest to keep from pushing TIS away from mat. Once pt returned to bed, pt able to lift head up off of pillow, and imitate reaching across body with LUE to begin rolling. Pt requires total A to cleanse after small BM in  brief but is able to follow commands for sliding LLE up into hooklying/supine. Pt able to follow command to give fist bump goodbye to this OT. Of note this OT told the pt, "when I see you next I will laugh if you give me the middle finger" pt then proceeds to greet his father who enters the room with the middle finger and slight smile. Exited session with pt seated in bed, exit alarm on and call light in reach   Therapy Documentation Precautions:  Precautions Precautions: Fall Precaution Comments: capped trach, PEG, binder Restrictions Weight Bearing Restrictions: No General:   Vital Signs:   Pain:     Therapy/Group: Individual Therapy  Shon Hale 05/14/2022, 2:30 PM

## 2022-05-14 NOTE — Progress Notes (Signed)
Physical Therapy TBI Note  Patient Details  Name: Carl Carpenter MRN: [id number] Date of Birth: 05/14/1986  Today's Date: 05/14/2022 PT Co-Treatment Time: 1300-1330 PT Co-Treatment Time Calculation (min): 30 min  Short Term Goals: Week 1:  PT Short Term Goal 1 (Week 1): Pt will tolerate sitting in WC 3 hours between therapy PT Short Term Goal 2 (Week 1): Pt will sit EOB with max assist of 1. PT Short Term Goal 3 (Week 1): Pt will attend to therapy task for ~5 mintues with max cues PT Short Term Goal 4 (Week 1): Pt will remain aroused throughout entire PT treatment session  Skilled Therapeutic Interventions/Progress Updates:      Therapy Documentation Precautions:  Precautions Precautions: Fall Precaution Comments: capped trach, PEG, binder Restrictions Weight Bearing Restrictions: No  Agitated Behavior Scale: TBI Observation Details Observation Environment: CIR Start of observation period - Date: 05/14/22 Start of observation period - Time: 1300 End of observation period - Date: 05/14/22 End of observation period - Time: 1400 Agitated Behavior Scale (DO NOT LEAVE BLANKS) Short attention span, easy distractibility, inability to concentrate: Present to a slight degree Impulsive, impatient, low tolerance for pain or frustration: Absent Uncooperative, resistant to care, demanding: Absent Violent and/or threatening violence toward people or property: Absent Explosive and/or unpredictable anger: Absent Rocking, rubbing, moaning, or other self-stimulating behavior: Absent Pulling at tubes, restraints, etc.: Absent Wandering from treatment areas: Absent Restlessness, pacing, excessive movement: Absent Repetitive behaviors, motor, and/or verbal: Absent Rapid, loud, or excessive talking: Absent Sudden changes of mood: Absent Easily initiated or excessive crying and/or laughter: Absent Self-abusiveness, physical and/or verbal: Absent Agitated behavior scale total score:  15    Therapy/Group: Co-Treatment  Breck Coons, PT, DPT 05/14/2022, 4:42 PM

## 2022-05-14 NOTE — Progress Notes (Signed)
Nutrition Follow-up  DOCUMENTATION CODES:   Non-severe (moderate) malnutrition in context of acute illness/injury  INTERVENTION:   Tube feed via PEG:  2 containers of Jevity 1.5 - QID 45 mL ProSource TF - QID 50 mL free water before and after each feeding Provides: 3000 kcal, 164 grams protein, and 1440 mL free water (1840 mL total free water).   NUTRITION DIAGNOSIS:   Moderate Malnutrition related to acute illness as evidenced by moderate fat depletion, moderate muscle depletion. - Ongoing  GOAL:   Patient will meet greater than or equal to 90% of their needs - Being met via TF  MONITOR:   TF tolerance, Weight trends  REASON FOR ASSESSMENT:   New TF    ASSESSMENT:   Pt with recent hospitalization 3/21 - 5/19 for severe TBI after [facility].   Spoke with RN outside of room, states that pt has tolerated bolus tube feeds. No signs of intolerance at this time.  Pt sitting up in wheelchair with girlfriend at bedside. Girlfriend reports no signs of intolerance as well.   No other questions or concerns at this time.   Medications reviewed and include: Colace, Ferrous Sulfate, SSI 0-15 units q4h Labs reviewed: 24 hr CBG 98-132  Diet Order:   Diet Order             Diet NPO time specified  Diet effective now                   EDUCATION NEEDS:   Not appropriate for education at this time  Skin:  Skin Assessment: Reviewed RN Assessment  Last BM:  5/24  Height:  Ht Readings from Last 1 Encounters:  05/11/22 6' 5"  (1.956 m)   Weight:  Wt Readings from Last 1 Encounters:  05/13/22 107.3 kg   BMI:  Body mass index is 28.04 kg/m.  Estimated Nutritional Needs:  Kcal:  2800-3000 Protein:  150-170 grams Fluid:  >2.8 L/day   Hermina Barters RD, LDN Clinical Dietitian See [facility] for contact information.

## 2022-05-14 NOTE — Progress Notes (Signed)
Speech Language Pathology Weekly Progress and Session Note  Patient Details  Name: Carl Carpenter MRN: [id number] Date of Birth: [birth year]  Beginning of progress report period: May 08, 2022 End of progress report period: May 14, 2022  Today's Date: 05/14/2022 SLP Individual Time: 1030-1130 SLP Individual Time Calculation (min): 60 min  Short Term Goals: Week 1: SLP Short Term Goal 1 (Week 1): Pt will consume therapeutic trials of 1/2 teaspoons of thin liquids with minimal overt s/s of aspiration over 3 consecutive sessions and max assist use of swallowing precautions prior to MBS or FEES SLP Short Term Goal 1 - Progress (Week 1): Not met SLP Short Term Goal 2 (Week 1): Pt will vocalize on command in 25% of opportunities with max assist multimodal cues. SLP Short Term Goal 2 - Progress (Week 1): Not met SLP Short Term Goal 3 (Week 1): Pt will answer basic yes/no questions using written choices on a white board and either eye gaze or gesturing in 25% of opportunities with max assist multimodal cues. SLP Short Term Goal 3 - Progress (Week 1): Met SLP Short Term Goal 4 (Week 1): Pt will follow 1 step  commands in >25% of opportunities with max assist multimodal cues. SLP Short Term Goal 4 - Progress (Week 1): Met SLP Short Term Goal 5 (Week 1): Pt will focus his attention on a targeted stimulus for 30 seconds with max assist multimodal cues. SLP Short Term Goal 5 - Progress (Week 1): Met    New Short Term Goals: Week 2: SLP Short Term Goal 1 (Week 2): Pt will consume therapeutic trials of 1/2 teaspoons of thin liquids and ice chips with minimal overt s/s of aspiration over 3 consecutive sessions and max assist use of swallowing precautions prior to MBS or FEES. SLP Short Term Goal 2 (Week 2): Pt will vocalize on command in 25% of opportunities with max assist multimodal cues. SLP Short Term Goal 3 (Week 2): Pt will answer basic yes/no questions using written choices on a white board and  either eye gaze or gesturing in 50% of opportunities with max assist multimodal cues. SLP Short Term Goal 4 (Week 2): Pt will follow 1 step  commands in  50% of opportunities with max assist multimodal cues. SLP Short Term Goal 5 (Week 2): Pt will focus his attention on a targeted stimulus for 60 seconds with max assist multimodal cues.  Weekly Progress Updates: Patient has made inconsistent gains and has met 3 of 5 STGs this reporting period. Currently, patient fluctuates between a [place]o Level III-IV and has met goals inconsistently throughout this reporting period. Patient remains NPO with a PEG tube and is participating in trials of ice chips/thin liquids with consistent overt s/s of aspiration. Patient also demonstrates decreased management of secretions with upper airway congestion and frequent coughing. Due to consistent overt s/s of aspiration and cognitive deficits, patient is not ready to participate in an objective swallow assessment at this time. Patient remains nonverbal and has episodes of non-purposeful vocalizations and has not been able to vocalize on command. Patient can follow basic 1-step commands in less than 25% of opportunities with Max-Total A multimodal cues. Patient is also utilizing a yes/no communication board inconsistently by pointing to responses in 25% of opportunities with Max-Total A multimodal cues. Patient can focus attention to stimuli for 30 second increments and track a stimuli intermittently. Family education ongoing. Patient would benefit from continued skilled SLP Intervention to maximize his cognitive-linguistic and swallowing function prior to  discharge.     Intensity: Minumum of 1-2 x/day, 30 to 90 minutes Frequency: 3 to 5 out of 7 days Duration/Length of Stay: 4 weeks Treatment/Interventions: Cognitive remediation/compensation;Cueing hierarchy;Dysphagia/aspiration precaution training;Environmental controls;Internal/external aids;Speech/Language  facilitation;Patient/family education;Functional tasks;Multimodal communication approach;Therapeutic Activities   Daily Session  Skilled Therapeutic Interventions:  Skilled treatment session focused on dysphagia, communication and cognitive goals. Upon arrival, patient was lethargic while upright in the wheelchair. Patient's eyes remained closed for ~5 minutes despite Max A multimodal cues. Nursing had just performed oral care and SLP utilized lemon glycerin swabs to help facilitate a purposeful response. Patient did awaken to stimuli and demonstrated improved movement of oral musculature. Patient with significantly reduce initiation of a swallow response resulting in consistent overt coughing, however, patient elicited a swallow response after each cough. Recommend ongoing trials with SLP. SLP also facilitated session by providing hand over hand assist for patient to follow 1-step commands with functional items. Patient utilized the yes/no communication board in 25% of opportunities despite Max A multimodal cues. Patient with increased eye contact today and ability to track stimuli today. Patient also waved "bye" to SLP at end of session. Patient left upright in wheelchair with alarm on and family present. Continue with current plan of care.     Pain No indications of pain   Therapy/Group: Individual Therapy  Mindi Akerson 05/14/2022, 3:40 PM

## 2022-05-15 LAB — CBC
HCT: 39.3 % (ref 39.0–52.0)
Hemoglobin: 13.6 g/dL (ref 13.0–17.0)
MCH: 31.3 pg (ref 26.0–34.0)
MCHC: 34.6 g/dL (ref 30.0–36.0)
MCV: 90.6 fL (ref 80.0–100.0)
Platelets: 184 10*3/uL (ref 150–400)
RBC: 4.34 MIL/uL (ref 4.22–5.81)
RDW: 12.3 % (ref 11.5–15.5)
WBC: 6.4 10*3/uL (ref 4.0–10.5)
nRBC: 0 % (ref 0.0–0.2)

## 2022-05-15 LAB — GLUCOSE, CAPILLARY
Glucose-Capillary: 106 mg/dL — ABNORMAL HIGH (ref 70–99)
Glucose-Capillary: 116 mg/dL — ABNORMAL HIGH (ref 70–99)
Glucose-Capillary: 116 mg/dL — ABNORMAL HIGH (ref 70–99)
Glucose-Capillary: 123 mg/dL — ABNORMAL HIGH (ref 70–99)
Glucose-Capillary: 124 mg/dL — ABNORMAL HIGH (ref 70–99)
Glucose-Capillary: 93 mg/dL (ref 70–99)

## 2022-05-15 LAB — BASIC METABOLIC PANEL
Anion gap: 9 (ref 5–15)
BUN: 15 mg/dL (ref 6–20)
CO2: 26 mmol/L (ref 22–32)
Calcium: 9.5 mg/dL (ref 8.9–10.3)
Chloride: 99 mmol/L (ref 98–111)
Creatinine, Ser: 0.75 mg/dL (ref 0.61–1.24)
GFR, Estimated: >60 mL/min (ref >60)
Glucose, Bld: 103 mg/dL — ABNORMAL HIGH (ref 70–99)
Potassium: 3.8 mmol/L (ref 3.5–5.1)
Sodium: 134 mmol/L — ABNORMAL LOW (ref 135–145)

## 2022-05-15 MED ORDER — METHYLPHENIDATE HCL 5 MG PO TABS
20.0000 mg | ORAL_TABLET | Freq: Two times a day (BID) | ORAL | Status: DC
  Administered 2022-05-15 – 2022-05-20 (×8): 20 mg
  Filled 2022-05-15 (×9): qty 4

## 2022-05-15 NOTE — Progress Notes (Signed)
Physical Therapy Weekly Progress Note  Patient Details  Name: Carl Carpenter MRN: [id number] Date of Birth: 03/11/1986  Beginning of progress report period: May 09, 2022 End of progress report period: May 15, 2022  Today's Date: 05/15/2022 PT Individual Time: 1545-1630 PT Individual Time Calculation (min): 45 min  and Today's Date: 05/15/2022 PT Co-Treatment Time: 1315-1345 PT Co-Treatment Time Calculation (min): 30 min  Patient has met 2 of 4 short term goals.  Pt is progressing slowly toward mobility goals, increasing tolerance to OOB activity, attending to tasks for short periods of time, and initiating sit to stand transfer training. Pt has been limited in progress by severity of brain injury with associated deficits in attention, initiation, and arousal. Pt will benefit from caregriver training prior to DC.  Patient continues to demonstrate the following deficits muscle weakness, decreased cardiorespiratoy endurance, impaired timing and sequencing, abnormal tone, unbalanced muscle activation, motor apraxia, ataxia, decreased coordination, and decreased motor planning, decreased initiation, decreased attention, decreased awareness, decreased problem solving, decreased safety awareness, decreased memory, and delayed processing, and decreased sitting balance, decreased standing balance, decreased postural control, and decreased balance strategies and therefore will continue to benefit from skilled PT intervention to increase functional independence with mobility.  Patient progressing toward long term goals..  Continue plan of care.  PT Short Term Goals Week 1:  PT Short Term Goal 1 (Week 1): Pt will tolerate sitting in WC 3 hours between therapy PT Short Term Goal 1 - Progress (Week 1): Met PT Short Term Goal 2 (Week 1): Pt will sit EOB with max assist of 1. PT Short Term Goal 2 - Progress (Week 1): Met PT Short Term Goal 3 (Week 1): Pt will attend to therapy task for ~5 mintues with max  cues PT Short Term Goal 3 - Progress (Week 1): Progressing toward goal PT Short Term Goal 4 (Week 1): Pt will remain aroused throughout entire PT treatment session PT Short Term Goal 4 - Progress (Week 1): Progressing toward goal Week 2:  PT Short Term Goal 1 (Week 2): Pt will initiate gait training. PT Short Term Goal 2 (Week 2): Pt will sit EOB to participate in therapeutic activity with modA +1. PT Short Term Goal 3 (Week 2): Pt will attend to therapy task for 5 minutes with max cues.  Skilled Therapeutic Interventions/Progress Updates:  Ambulation/gait training;Discharge planning;Functional mobility training;Psychosocial support;Therapeutic Activities;Visual/perceptual remediation/compensation;Balance/vestibular training;Disease management/prevention;Neuromuscular re-education;Skin care/wound management;Therapeutic Exercise;Wheelchair propulsion/positioning;Cognitive remediation/compensation;DME/adaptive equipment instruction;Pain management;Splinting/orthotics;UE/LE Strength taining/ROM;Community reintegration;Functional electrical stimulation;Patient/family education;Stair training;UE/LE Coordination activities   1st Session: Pt cotreated with OT for session with two sets of skilled hands. Pt received seated in tilt in space and in no apparent pain. Pt initially has eyes open and appears to be tracking therapists' cues. WC transport to gym. During slideboard transfer, pt noted to have soiled brief with urine. Return to room. Slideboard transfer to bed with totalA +2. Return to supine with totalA +2 and cues for sequencing. Pt performs bilateral rolling with totalA to complete hygiene and clothing change. Return to seated with totalA +2. Slideboard from bed to [facility] to mat table with totalA +2 and verbal cues for initiation and hand placement. Shortly after transferring to mat, pt becomes very somnolent and has difficulty keeping eyes open. MaxA provided for sitting balance with OT seated on exercise  ball behind pt, providing tactile cueing for posture and midline orientation. Pt positioned in semi reclined posture, upright posture, and forward flexed with arms on knees to provide approximation of joints  and NM feedback. Pt transferred back to [facility] and then bed with slideboard and totalA +2. Returned to supine to rest as pt continues to be somnolent. Left with all needs within reach.  2nd Session: Pt received supine in bed and in no apparent pain. Pt noted to have urine soaked brief. Pt performs bilateral rolling with totalA to change brief and lower body dressing. Supine to sit with totalA +2 and cues for sequencing. Pt changes shirt while seated at EOB with maxA +1 for sitting balance. TotalA +2 for slideboard transfer to [place]. WC transport to gym for time management. Pt seated in WC and PT tosses ball to elicit motor response from pt. Pt appears to follow ball 25% of time with gaze but unable to substantially move limbs to attempt catching ball.   Pt performs sit to stand with totalA +3 with 3 musketeers technique. PT and OT on either side of pt underneath arms, providing verbal and tactile cueing for initiation, sequencing, and posture, while rehab tech provides manual cueing at trunk to prevent forward flexion. Pt is able to remain standing for 1-2 minutes with maxA +2 to maxA +3, depending on trunk control. Following brief seated rest break, pt performs additional sit to stand with same cueing and assist levels. WC transport back to room. Slideobard back to bed with totalA +2. Left supine with all needs within reach.  Therapy Documentation Precautions:  Precautions Precautions: Fall Precaution Comments: capped trach, PEG, binder Restrictions Weight Bearing Restrictions: No   Therapy/Group: Individual Therapy  Breck Coons, PT, DPT 05/15/2022, 8:17 PM

## 2022-05-15 NOTE — Progress Notes (Signed)
Changed pt's dressing around peg tube

## 2022-05-15 NOTE — Progress Notes (Signed)
Speech Language Pathology Daily Session Note  Patient Details  Name: Carl Carpenter MRN: [id number] Date of Birth: February 04, 1986  Today's Date: 05/15/2022 SLP Individual Time: 1155-2080 SLP Individual Time Calculation (min): 52 min  Short Term Goals: Week 2: SLP Short Term Goal 1 (Week 2): Pt will consume therapeutic trials of 1/2 teaspoons of thin liquids and ice chips with minimal overt s/s of aspiration over 3 consecutive sessions and max assist use of swallowing precautions prior to MBS or FEES. - Swallow response on 1 out of 6 trials with use of lemon glycerin swabs despite Max multimodal A to include tactile assistance to hyoglossal area; therefore, oral suctioning provided via [place]. Swallow response noted after each cough.   SLP Short Term Goal 2 (Week 2): Pt will vocalize on command in 25% of opportunities with max assist multimodal cues. - Vocalized non-purposefully (mm hmm) x 2 (<25% of the time).  SLP Short Term Goal 3 (Week 2): Pt will answer basic yes/no questions using written choices on a white board and either eye gaze or gesturing in 50% of opportunities with max assist multimodal cues. - <20% of opportunities with Max A multimodal cues given high-tech yes/no AAC application and Total A for use of application; required Total A.  SLP Short Term Goal 4 (Week 2): Pt will follow 1 step  commands in  50% of opportunities with max assist multimodal cues. - 25% of opportunities with Max A multimodal cues.  SLP Short Term Goal 5 (Week 2): Pt will focus his attention on a targeted stimulus for 60 seconds with max assist multimodal cues. - 30 seconds with Max A multimodal cues.  Skilled Therapeutic Interventions: Pt seen this date for skilled ST intervention targeting dysphagia, communication, and cognitive goal outlined above. Pt received lethargic, though with eyes open; in TIS. Dad present for the majority of today's session. Participated in ST intervention. Please see above for  objective data re: pt's performance during today's session. Pt left in TIS with safety measures in place. Continue per current ST POC.  Pain No indication of pain  Therapy/Group: Individual Therapy  Jejuan Scala A Odeal Welden 05/15/2022, 11:57 AM

## 2022-05-15 NOTE — Progress Notes (Signed)
PROGRESS NOTE   Subjective/Complaints: Carl Carpenter in room. Had a pretty good night. Was up earlier but now back asleep. No problems reported although Carl Carpenter said stomach was "spasming" a bit last night near PEG area  ROS: Limited due to cognitive/behavioral    Objective:   No results found. Recent Labs    05/15/22 0518  WBC 6.4  HGB 13.6  HCT 39.3  PLT 184    Recent Labs    05/15/22 0518  NA 134*  K 3.8  CL 99  CO2 26  GLUCOSE 103*  BUN 15  CREATININE 0.75  CALCIUM 9.5     Intake/Output Summary (Last 24 hours) at 05/15/2022 1018 Last data filed at 05/15/2022 0524 Gross per 24 hour  Intake --  Output 1900 ml  Net -1900 ml         Physical Exam: Vital Signs Blood pressure 117/90, pulse 86, temperature 98.3 F (36.8 C), resp. rate 17, height 6\' 5"  (1.956 m), weight 107.3 kg, SpO2 94 %.  Constitutional: No distress . Vital signs reviewed. HEENT: NCAT, EOMI, oral membranes moist Neck: supple Cardiovascular: RRR without murmur. No JVD    Respiratory/Chest: CTA Bilaterally without wheezes or rales. Normal effort    GI/Abdomen: BS +, non-tender, non-distended, PEG site with mild s/s drainage Ext: no clubbing, cyanosis, or edema Psych: flat Musculoskeletal:        no pain with ROM  Skin:    Comments: Abrasion left knee present, scalp, foam dressing sacrum and bilateral heels. Foam dressing heels, sacrum Neurological:     Comments: slow to arouse today. Does squeeze my hand with his left.   Decreased LT and pain in all 4's. DTR's a little brisk but no resting tone, perhaps some mild flexor tone in right hand.     Assessment/Plan: 1. Functional deficits which require 3+ hours per day of interdisciplinary therapy in a comprehensive inpatient rehab setting. Physiatrist is providing close team supervision and 24 hour management of active medical problems listed below. Physiatrist and rehab team continue to assess  barriers to discharge/monitor patient progress toward functional and medical goals  Care Tool:  Bathing    Body parts bathed by patient: Right arm, Left arm, Chest, Abdomen, Front perineal area, Buttocks, Right upper leg, Left upper leg, Right lower leg, Left lower leg         Bathing assist Assist Level: Dependent - Patient 0%     Upper Body Dressing/Undressing Upper body dressing   What is the patient wearing?: Pull over shirt    Upper body assist Assist Level: Dependent - Patient 0%    Lower Body Dressing/Undressing Lower body dressing      What is the patient wearing?: Pants, Incontinence brief     Lower body assist Assist for lower body dressing: Total Assistance - Patient < 25%     Toileting Toileting    Toileting assist Assist for toileting: Dependent - Patient 0%     Transfers Chair/bed transfer  Transfers assist  Chair/bed transfer activity did not occur: Safety/medical concerns  Chair/bed transfer assist level: Dependent - mechanical lift     Locomotion Ambulation   Ambulation assist   Ambulation activity did  not occur: Safety/medical concerns          Walk 10 feet activity   Assist  Walk 10 feet activity did not occur: Safety/medical concerns        Walk 50 feet activity   Assist Walk 50 feet with 2 turns activity did not occur: Safety/medical concerns         Walk 150 feet activity   Assist Walk 150 feet activity did not occur: Safety/medical concerns         Walk 10 feet on uneven surface  activity   Assist Walk 10 feet on uneven surfaces activity did not occur: Safety/medical concerns         Wheelchair     Assist Is the patient using a wheelchair?: Yes Type of Wheelchair: Manual    Wheelchair assist level: Dependent - Patient 0%      Wheelchair 50 feet with 2 turns activity    Assist        Assist Level: Dependent - Patient 0%   Wheelchair 150 feet activity     Assist       Assist Level: Dependent - Patient 0%   Blood pressure 117/90, pulse 86, temperature 98.3 F (36.8 C), resp. rate 17, height 6\' 5"  (1.956 m), weight 107.3 kg, SpO2 94 %.    Medical Problem List and Plan: 1. 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  DVT right posterior tibial and peroneal veins (03/22/22): -anticoagulation:  Pharmaceutical: Other (comment) Eliquis             -antiplatelet therapy: none 3. Pain: continue Tylenol prn 4. Mood: LCSW to evaluate and provide emotional support             -antipsychotic agents: none 5. Impaired attention: This patient is not capable of making decisions on his own behalf.             -continue amantadine 200mg  bid             -5/26: increase ritalin to 20mg  bid  6. Dysphagia. S/p PEG 3/29.  --NPO -continue TF -bumper is very mobile, adjust as needed -completing diflucan for redness around peg and mild thrush 7. Fluids/Electrolytes/Nutrition: Routine Is and Os and follow-up chemistries             --continue tube feeds Jevity 1.5 at 80 mL/hr             --continue Prosource             --continue regular oral care  -following weights, check prealbumin Monday Filed Weights   05/12/22 0500 05/13/22 0840  Weight: 103 kg 107.3 kg    9: C1 right transverse process fracture--he's out of collar 10: Left Occipital condyle fracture--out of collar 11: Acute ventilator respiratory failure requiring trach and PEG 3/29              --decannulated , stoma closed             --continue  Robitussin 25 mL q 4 hours  -continue robinul via PEG  - stoma closed  -5/26: cough seems better, wbcs' normal, afebrile   12: Right  ear laceration:  healing 14: Tongue laceration: monitor healing 15: Hypertension: continue Lopressor 50 mg BID             -well controlled 5/26 16. Hyperglycemia secondary to tube feeds: continue to monitor    18. Iron deficiency:  daily iron supplement added.  19. Tremor,?clonus: continue trial of propranolol 10mg  tid   LOS: 7 days A FACE TO FACE EVALUATION WAS PERFORMED  05/15/2022, 10:18 AM

## 2022-05-15 NOTE — Progress Notes (Signed)
Occupational Therapy TBI Note  Patient Details  Name: Carl Carpenter MRN: [id number] Date of Birth: April 19, 1986  Today's Date: 05/15/2022 OT Individual Time: 0855-1000 OT Individual Time Calculation (min): 65 min    Session 2 Today's Date: 05/15/2022 OT Co-Treatment Time: 1300-1315 OT Co-Treatment Time Calculation (min): 15 min Total co-tx with PT 1300-1345    Short Term Goals: Week 1:  OT Short Term Goal 1 (Week 1): Patient will attend to stimuli presented in central vision for 1 min indicting improved arousal/attention. OT Short Term Goal 2 (Week 1): Patient will maintain static sitting balance at EOB with Mod A for >5 min in prep for ADLs. OT Short Term Goal 3 (Week 1): Patient will complete sit to stand transfer with Max A +2 in prep for ADLs. OT Short Term Goal 4 (Week 1): Patient will follow 1-step verbal commands with 25% accuracy during 3 consecutive treatment sessions in prep for ADLs.  Skilled Therapeutic Interventions/Progress Updates:    Pt received supine with no c/o pain, eyes closed and quite lethargic for first half of session. No indications of pain throughout session by facial expressions or withdrawing.Total A to check brief and don shorts. Primo fit catheter removed. Maxi move used to transfer pt to TIS w/c- total +2 assist to position sling. Once up in the w/c he was more alert but still with intermittent eyes closing. Max multimodal cueing for initiation, attention to task, and sequencing. Pt initiated brushing his beard with max multimodal cueing and backward chaining technique. UB bathing and shirt donned with total A. In the chair pt participated in catching task with a soft ball- he properly initiated with no further cueing and even moved his R fingers slightly in response to ball coming at him. Extra time provided throughout session to allow pt to initiate tasks. He was left sitting up with all needs met, seat belt fastened. TBI education provided to his father in  room.    Session 2: Co-tx with PT Robina Ade. OT goals addressed included focused attention, arousal/awareness, trunk/postural control, and volitional movement/command following. Pt received in TIS w/c with poor arousal to begin and pale looking. Vitals assessed and WFL. Initiated slideboard transfer and discovered pt to be heavily incontinent of urine. He was dependently transferred back to bed for a quick brief change and clothes change. He was transferred to the mat via slideboard, total A +2. Worked on postural control, with waxing/waning arousal. Max multimodal cueing to initiate movements, with litte no initiation this session. After several minutes his arousal improved significantly and he worked on propped sitting EOM with BUE extended forward. He required total A +2 with slight improvements in head control. He demonstrated nystagmus with both eyes open and closed. He returned to the w/c and then to the bed. He was left supine with all needs met, fiance [name] present.    Therapy Documentation Precautions:  Precautions Precautions: Fall Precaution Comments: capped trach, PEG, binder Restrictions Weight Bearing Restrictions: No   Agitated Behavior Scale: TBI Observation Details Observation Environment: pt room Start of observation period - Date: 05/15/22 Start of observation period - Time: 0855 End of observation period - Date: 05/15/22 End of observation period - Time: 0955 Agitated Behavior Scale (DO NOT LEAVE BLANKS) Short attention span, easy distractibility, inability to concentrate: Present to a slight degree Impulsive, impatient, low tolerance for pain or frustration: Absent Uncooperative, resistant to care, demanding: Absent Violent and/or threatening violence toward people or property: Absent Explosive and/or unpredictable anger: Absent Rocking, rubbing,  moaning, or other self-stimulating behavior: Absent Pulling at tubes, restraints, etc.: Absent Wandering from treatment  areas: Absent Restlessness, pacing, excessive movement: Absent Repetitive behaviors, motor, and/or verbal: Absent Rapid, loud, or excessive talking: Absent Sudden changes of mood: Absent Easily initiated or excessive crying and/or laughter: Absent Self-abusiveness, physical and/or verbal: Absent Agitated behavior scale total score: 15   Therapy/Group: Individual Therapy  [name] 05/15/2022, 10:31 AM

## 2022-05-16 LAB — GLUCOSE, CAPILLARY
Glucose-Capillary: 101 mg/dL — ABNORMAL HIGH (ref 70–99)
Glucose-Capillary: 110 mg/dL — ABNORMAL HIGH (ref 70–99)
Glucose-Capillary: 111 mg/dL — ABNORMAL HIGH (ref 70–99)
Glucose-Capillary: 111 mg/dL — ABNORMAL HIGH (ref 70–99)
Glucose-Capillary: 114 mg/dL — ABNORMAL HIGH (ref 70–99)
Glucose-Capillary: 120 mg/dL — ABNORMAL HIGH (ref 70–99)

## 2022-05-16 MED ORDER — ALBUTEROL SULFATE (2.5 MG/3ML) 0.083% IN NEBU: 2.5000 mg | INHALATION_SOLUTION | RESPIRATORY_TRACT | Status: DC | PRN

## 2022-05-16 MED ORDER — ORAL CARE MOUTH RINSE
15.0000 mL | Freq: Two times a day (BID) | OROMUCOSAL | Status: DC
  Administered 2022-05-17 – 2022-06-29 (×85): 15 mL via OROMUCOSAL

## 2022-05-16 MED ORDER — CHLORHEXIDINE GLUCONATE 0.12 % MT SOLN
15.0000 mL | Freq: Two times a day (BID) | OROMUCOSAL | Status: DC
  Administered 2022-05-17 – 2022-06-29 (×86): 15 mL via OROMUCOSAL
  Filled 2022-05-16 (×83): qty 15

## 2022-05-16 NOTE — Progress Notes (Signed)
Speech Language Pathology TBI Note  Patient Details  Name: Carl Carpenter MRN: [id number] Date of Birth: 10/17/1986  Today's Date: 05/16/2022 SLP Individual Time: 0830-0930 SLP Individual Time Calculation (min): 60 min  Short Term Goals: Week 2: SLP Short Term Goal 1 (Week 2): Pt will consume therapeutic trials of 1/2 teaspoons of thin liquids and ice chips with minimal overt s/s of aspiration over 3 consecutive sessions and max assist use of swallowing precautions prior to MBS or FEES. - Did not formally address this date. Pt with baseline congested cough. Total A for oral care via suction toothbrush and oral suctioning via [place].  SLP Short Term Goal 2 (Week 2): Pt will vocalize on command in 25% of opportunities with max assist multimodal cues. - ? purposeful attempt at vocalization ("mm") on command x 1 (<5% of the time) with Max A multimodal cues.  SLP Short Term Goal 3 (Week 2): Pt will answer basic yes/no questions using written choices on a white board and either eye gaze or gesturing in 50% of opportunities with max assist multimodal cues. - Pt answered biographical yes/no questions using written choices on white board with eye gaze on ~25% of opportunities with Max A multimodal cues.   SLP Short Term Goal 4 (Week 2): Pt will follow 1 step  commands in  50% of opportunities with max assist multimodal cues. - Followed 1-step commands on ~25% of opportunities with Max A multimodal cues.  SLP Short Term Goal 5 (Week 2): Pt will focus his attention on a targeted stimulus for 60 seconds with max assist multimodal cues. - Pt focused his attention on targeted stimulus for 30 seconds with Max A multimodal cues.  Skilled Therapeutic Interventions: Pt seen this date for skilled ST intervention targeting communication and cognitive goal outlined above. Pt received awake with eyes open and lying in bed; receiving bolus feed from LPN. Tracking horizontal with verbal cues; decreased tracking in  vertical plane. Girlfriend at bedside. Pt moved from bed to TIS with Maxi Move and assistance from NT and LPN. Increased tone and spasms when OOB; LPN notified.   Please see above for objective data re: pt's performance during today's session. Family education ongoing re: importance of oral care, ST POC, and brain injury education. Pt remains somewhat stimulable for skilled intervention and would continue to benefit from current ST POC. Pt left in bed with girlfriend at bedside. Continue per POC.  Pain Pt appeared to exhibit more tone with placement in TIS; utilized Maxi Move to place back in bed.  Agitated Behavior Scale: TBI Observation Details Observation Environment: pt room Start of observation period - Date: 05/16/22 Start of observation period - Time: 0830 End of observation period - Date: 05/16/22 End of observation period - Time: 0930 Agitated Behavior Scale (DO NOT LEAVE BLANKS) Short attention span, easy distractibility, inability to concentrate: Present to a slight degree Impulsive, impatient, low tolerance for pain or frustration: Absent Uncooperative, resistant to care, demanding: Absent Violent and/or threatening violence toward people or property: Absent Explosive and/or unpredictable anger: Absent Rocking, rubbing, moaning, or other self-stimulating behavior: Absent Pulling at tubes, restraints, etc.: Absent Wandering from treatment areas: Absent Restlessness, pacing, excessive movement: Absent Repetitive behaviors, motor, and/or verbal: Present to a slight degree Rapid, loud, or excessive talking: Absent Sudden changes of mood: Absent Easily initiated or excessive crying and/or laughter: Absent Self-abusiveness, physical and/or verbal: Absent Agitated behavior scale total score: 16  Therapy/Group: Individual Therapy  [place] A Arnett Duddy 05/16/2022, 12:43 PM

## 2022-05-16 NOTE — Progress Notes (Signed)
PROGRESS NOTE   Subjective/Complaints: Patient's significant other at bedside. No overnight issues, has been sweating some.  PEG tube site still reddened and with bumper somewhat mobile.  ROS: Limited due to cognitive/behavioral    Objective:   No results found. Recent Labs    05/15/22 0518  WBC 6.4  HGB 13.6  HCT 39.3  PLT 184    Recent Labs    05/15/22 0518  NA 134*  K 3.8  CL 99  CO2 26  GLUCOSE 103*  BUN 15  CREATININE 0.75  CALCIUM 9.5     Intake/Output Summary (Last 24 hours) at 05/16/2022 1503 Last data filed at 05/16/2022 1243 Gross per 24 hour  Intake --  Output 800 ml  Net -800 ml         Physical Exam: Vital Signs Blood pressure 132/85, pulse 91, temperature 98.1 F (36.7 C), resp. rate 18, height 6\' 5"  (1.956 m), weight 107.3 kg, SpO2 95 %.  Constitutional: No distress . Vital signs reviewed. HEENT: NCAT, EOMI, oral membranes moist Neck: supple Cardiovascular: RRR without murmur. No JVD    Respiratory/Chest: CTA Bilaterally without wheezes or rales. Normal effort    GI/Abdomen: BS +, non-tender, non-distended, PEG site with some redness Ext: no clubbing, cyanosis, or edema Psych: flat Musculoskeletal:        no pain with ROM  Skin:    Comments: Abrasion left knee present, scalp, bilateral heels have foam dressing.  Sacral dressing present. Neurological:     Comments: Patient can sometime acknowledge understanding by moving eyebrows. Some ability to squeeze my hand with his left hand. Decreased LT all 4's.  Some DTR briskness but no abnormal resting tone.  Possible mild flexor tone in right hand.   Assessment/Plan: 1. Functional deficits which require 3+ hours per day of interdisciplinary therapy in a comprehensive inpatient rehab setting. Physiatrist is providing close team supervision and 24 hour management of active medical problems listed below. Physiatrist and rehab team  continue to assess barriers to discharge/monitor patient progress toward functional and medical goals  Care Tool:  Bathing    Body parts bathed by patient: Right arm, Left arm, Chest, Abdomen, Front perineal area, Buttocks, Right upper leg, Left upper leg, Right lower leg, Left lower leg         Bathing assist Assist Level: Dependent - Patient 0%     Upper Body Dressing/Undressing Upper body dressing   What is the patient wearing?: Pull over shirt    Upper body assist Assist Level: Dependent - Patient 0%    Lower Body Dressing/Undressing Lower body dressing      What is the patient wearing?: Pants, Incontinence brief     Lower body assist Assist for lower body dressing: Total Assistance - Patient < 25%     Toileting Toileting    Toileting assist Assist for toileting: Dependent - Patient 0%     Transfers Chair/bed transfer  Transfers assist  Chair/bed transfer activity did not occur: Safety/medical concerns  Chair/bed transfer assist level: Dependent - mechanical lift     Locomotion Ambulation   Ambulation assist   Ambulation activity did not occur: Safety/medical concerns  Walk 10 feet activity   Assist  Walk 10 feet activity did not occur: Safety/medical concerns        Walk 50 feet activity   Assist Walk 50 feet with 2 turns activity did not occur: Safety/medical concerns         Walk 150 feet activity   Assist Walk 150 feet activity did not occur: Safety/medical concerns         Walk 10 feet on uneven surface  activity   Assist Walk 10 feet on uneven surfaces activity did not occur: Safety/medical concerns         Wheelchair     Assist Is the patient using a wheelchair?: Yes Type of Wheelchair: Manual    Wheelchair assist level: Dependent - Patient 0%      Wheelchair 50 feet with 2 turns activity    Assist        Assist Level: Dependent - Patient 0%   Wheelchair 150 feet activity      Assist      Assist Level: Dependent - Patient 0%   Blood pressure 132/85, pulse 91, temperature 98.1 F (36.7 C), resp. rate 18, height 6\' 5"  (1.956 m), weight 107.3 kg, SpO2 95 %.    Medical Problem List and Plan: 1. 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  DVT right posterior tibial and peroneal veins (03/22/22): -anticoagulation:  Pharmaceutical: Other (comment) Eliquis             -antiplatelet therapy: none 3. Pain: continue Tylenol prn 4. Mood: LCSW to evaluate and provide emotional support             -antipsychotic agents: none 5. Impaired attention: This patient is not capable of making decisions on his own behalf.             -continue amantadine 200mg  bid             -5/26: increase ritalin to 20mg  bid              5/27 Continue ritalin 20 mg bid 6. Dysphagia. S/p PEG 3/29.  --NPO -continue TF -bumper is very mobile, adjust as needed -completing diflucan for redness around peg and mild thrush 7. Fluids/Electrolytes/Nutrition: Routine Is and Os and follow-up chemistries             --continue tube feeds Jevity 1.5 at 80 mL/hr             --continue Prosource             --continue regular oral care  -following weights, check prealbumin Monday             5/27 Continue bolus tube feeds with prealbumin check on Monday Filed Weights   05/12/22 0500 05/13/22 0840  Weight: 103 kg 107.3 kg    9: C1 right transverse process fracture--he's out of collar 10: Left Occipital condyle fracture--out of collar 11: Acute ventilator respiratory failure requiring trach and PEG 3/29              --  decannulated , stoma closed             --continue  Robitussin 25 mL q 4 hours  -continue robinul via PEG  - stoma closed  -5/26: cough seems better, wbcs' normal, afebrile 5/27 Cough better but some secretions in throat in morning, per significant other.   12: Right ear laceration:  healing 14: Tongue laceration: monitor healing 15: Hypertension: continue Lopressor 50 mg BID             -well controlled 5/26 5/27 BP well controlled    05/16/2022    1:36 PM 05/16/2022    4:07 AM 05/15/2022    8:42 PM  Vitals with BMI  Systolic 132 128 654  Diastolic 85 68 78  Pulse 91 79 88    16. Hyperglycemia secondary to tube feeds: continue to monitor    18. Iron deficiency:  daily iron supplement added.  19. Tremor,?clonus: continue trial of propranolol 10mg  tid  5/27 Continue propranolol 10 mg tid  LOS: 8 days A FACE TO FACE EVALUATION WAS PERFORMED  6/27 05/16/2022, 3:03 PM

## 2022-05-17 LAB — GLUCOSE, CAPILLARY
Glucose-Capillary: 100 mg/dL — ABNORMAL HIGH (ref 70–99)
Glucose-Capillary: 102 mg/dL — ABNORMAL HIGH (ref 70–99)
Glucose-Capillary: 102 mg/dL — ABNORMAL HIGH (ref 70–99)
Glucose-Capillary: 102 mg/dL — ABNORMAL HIGH (ref 70–99)
Glucose-Capillary: 115 mg/dL — ABNORMAL HIGH (ref 70–99)
Glucose-Capillary: 136 mg/dL — ABNORMAL HIGH (ref 70–99)

## 2022-05-17 NOTE — Progress Notes (Signed)
PROGRESS NOTE   Subjective/Complaints: [name] at bedside. No overnight issues.  [name] is very supportive and assists with bathing, dressing, and exercise to improve hand strength.   ROS: Limited due to cognitive/behavioral    Objective:   No results found. Recent Labs    05/15/22 0518  WBC 6.4  HGB 13.6  HCT 39.3  PLT 184    Recent Labs    05/15/22 0518  NA 134*  K 3.8  CL 99  CO2 26  GLUCOSE 103*  BUN 15  CREATININE 0.75  CALCIUM 9.5     Intake/Output Summary (Last 24 hours) at 05/17/2022 1404 Last data filed at 05/17/2022 1349 Gross per 24 hour  Intake --  Output 2125 ml  Net -2125 ml         Physical Exam: Vital Signs Blood pressure 122/83, pulse 77, temperature 98 F (36.7 C), temperature source Axillary, resp. rate 17, height 6\' 5"  (1.956 m), weight 107.3 kg, SpO2 94 %.  Constitutional: No distress . Vital signs reviewed. HEENT: NCAT, EOMI, oral membranes moist Neck: supple Cardiovascular: RRR without murmur. No JVD    Respiratory/Chest: CTA Bilaterally without wheezes or rales. Normal effort    GI/Abdomen: BS +, non-tender, non-distended, PEG site with some redness. Abdominal binder present. Ext: no clubbing, cyanosis, or edema Psych: flat Musculoskeletal:        no pain with ROM  Skin:    Comments: Abrasion left knee present, scalp, bilateral heels have foam dressing.  Sacral dressing present. Neurological:     Comments: Patient can sometime acknowledge understanding by moving eyebrows. Can with prompting from mother also occasionally move left foot and track with eyes. Some ability to squeeze my hand with his left hand. Decreased LT all 4's.  Some DTR briskness but no abnormal resting tone.  Possible mild flexor tone in right hand.   Assessment/Plan: 1. Functional deficits which require 3+ hours per day of interdisciplinary therapy in a comprehensive inpatient rehab  setting. Physiatrist is providing close team supervision and 24 hour management of active medical problems listed below. Physiatrist and rehab team continue to assess barriers to discharge/monitor patient progress toward functional and medical goals  Care Tool:  Bathing    Body parts bathed by patient: Right arm, Left arm, Chest, Abdomen, Front perineal area, Buttocks, Right upper leg, Left upper leg, Right lower leg, Left lower leg         Bathing assist Assist Level: Dependent - Patient 0%     Upper Body Dressing/Undressing Upper body dressing   What is the patient wearing?: Pull over shirt    Upper body assist Assist Level: Dependent - Patient 0%    Lower Body Dressing/Undressing Lower body dressing      What is the patient wearing?: Pants, Incontinence brief     Lower body assist Assist for lower body dressing: Total Assistance - Patient < 25%     Toileting Toileting    Toileting assist Assist for toileting: Dependent - Patient 0%     Transfers Chair/bed transfer  Transfers assist  Chair/bed transfer activity did not occur: Safety/medical concerns  Chair/bed transfer assist level: Dependent - mechanical lift  Locomotion Ambulation   Ambulation assist   Ambulation activity did not occur: Safety/medical concerns          Walk 10 feet activity   Assist  Walk 10 feet activity did not occur: Safety/medical concerns        Walk 50 feet activity   Assist Walk 50 feet with 2 turns activity did not occur: Safety/medical concerns         Walk 150 feet activity   Assist Walk 150 feet activity did not occur: Safety/medical concerns         Walk 10 feet on uneven surface  activity   Assist Walk 10 feet on uneven surfaces activity did not occur: Safety/medical concerns         Wheelchair     Assist Is the patient using a wheelchair?: Yes Type of Wheelchair: Manual    Wheelchair assist level: Dependent - Patient 0%       Wheelchair 50 feet with 2 turns activity    Assist        Assist Level: Dependent - Patient 0%   Wheelchair 150 feet activity     Assist      Assist Level: Dependent - Patient 0%   Blood pressure 122/83, pulse 77, temperature 98 F (36.7 C), temperature source Axillary, resp. rate 17, height 6\' 5"  (1.956 m), weight 107.3 kg, SpO2 94 %.    Medical Problem List and Plan: 1. 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  DVT right posterior tibial and peroneal veins (03/22/22): -anticoagulation:  Pharmaceutical: Other (comment) Eliquis             -antiplatelet therapy: none 3. Pain: continue Tylenol prn 4. Mood: LCSW to evaluate and provide emotional support             -antipsychotic agents: none 5. Impaired attention: This patient is not capable of making decisions on his own behalf.             -continue amantadine 200mg  bid             -5/26: increase ritalin to 20mg  bid              5/27 Continue ritalin 20 mg bid             5/28 Awake and alert today. 6. Dysphagia. S/p PEG 3/29.  --NPO -continue TF -bumper is very mobile, adjust as needed -completing diflucan for redness around peg and mild thrush 5/28 Continue zinc oxide to protect skin surrounding PEG tube 7. Fluids/Electrolytes/Nutrition: Routine Is and Os and follow-up chemistries             --continue tube feeds Jevity 1.5 at 80 mL/hr             --continue Prosource             --continue regular oral care  -following weights, check prealbumin Monday             5/28 Continue  bolus tube feeds with prealbumin check on Monday Filed Weights   05/12/22 0500 05/13/22 0840   Weight: 103 kg 107.3 kg    9: C1 right transverse process fracture--he's out of collar 10: Left Occipital condyle fracture--out of collar 11: Acute ventilator respiratory failure requiring trach and PEG 3/29              --decannulated , stoma closed             --continue Robitussin 25 mL q 4 hours  -continue robinul via PEG  - stoma closed  -5/26: cough seems better, wbcs' normal, afebrile 5/27 Cough better but some secretions in throat in morning, per significant other.   5/28 Per pt's mother, unsure if cough, clearing secretions, or attempt to vocalize.  Afebrile, normal WBC's 12: Right ear laceration:  healing 14: Tongue laceration: monitor healing 15: Hypertension: continue Lopressor 50 mg BID             -well controlled 5/26 5/28 BP well controlled    05/17/2022    5:00 AM 05/16/2022    7:43 PM 05/16/2022    1:36 PM  Vitals with BMI  Systolic 122 127 518  Diastolic 83 88 85  Pulse 77 92 91    16. Hyperglycemia secondary to tube feeds: continue to monitor    18. Iron deficiency:  daily iron supplement added.  19. Tremor,?clonus: continue trial of propranolol 10mg  tid  5/28 Continue propranolol 10 mg tid  LOS: 9 days A FACE TO FACE EVALUATION WAS PERFORMED  6/28 05/17/2022, 2:04 PM

## 2022-05-18 LAB — CBC
HCT: 41.2 % (ref 39.0–52.0)
Hemoglobin: 14.1 g/dL (ref 13.0–17.0)
MCH: 31.4 pg (ref 26.0–34.0)
MCHC: 34.2 g/dL (ref 30.0–36.0)
MCV: 91.8 fL (ref 80.0–100.0)
Platelets: 206 10*3/uL (ref 150–400)
RBC: 4.49 MIL/uL (ref 4.22–5.81)
RDW: 12.4 % (ref 11.5–15.5)
WBC: 5.2 10*3/uL (ref 4.0–10.5)
nRBC: 0 % (ref 0.0–0.2)

## 2022-05-18 LAB — PREALBUMIN: Prealbumin: 35.8 mg/dL (ref 18–38)

## 2022-05-18 LAB — BASIC METABOLIC PANEL
Anion gap: 9 (ref 5–15)
BUN: 17 mg/dL (ref 6–20)
CO2: 25 mmol/L (ref 22–32)
Calcium: 9.8 mg/dL (ref 8.9–10.3)
Chloride: 100 mmol/L (ref 98–111)
Creatinine, Ser: 0.79 mg/dL (ref 0.61–1.24)
GFR, Estimated: >60 mL/min (ref >60)
Glucose, Bld: 103 mg/dL — ABNORMAL HIGH (ref 70–99)
Potassium: 4 mmol/L (ref 3.5–5.1)
Sodium: 134 mmol/L — ABNORMAL LOW (ref 135–145)

## 2022-05-18 LAB — GLUCOSE, CAPILLARY
Glucose-Capillary: 102 mg/dL — ABNORMAL HIGH (ref 70–99)
Glucose-Capillary: 119 mg/dL — ABNORMAL HIGH (ref 70–99)
Glucose-Capillary: 121 mg/dL — ABNORMAL HIGH (ref 70–99)
Glucose-Capillary: 126 mg/dL — ABNORMAL HIGH (ref 70–99)
Glucose-Capillary: 127 mg/dL — ABNORMAL HIGH (ref 70–99)
Glucose-Capillary: 91 mg/dL (ref 70–99)

## 2022-05-18 NOTE — Plan of Care (Signed)
Goals downgraded to reflect more realistic pt expectations  Problem: RH Balance Goal: LTG: Patient will maintain dynamic sitting balance (OT) Description: LTG:  Patient will maintain dynamic sitting balance with assistance during activities of daily living (OT) Flowsheets (Taken 05/18/2022 1221) LTG: Pt will maintain dynamic sitting balance during ADLs with: (downgraded 5/29- SD) Maximal Assistance - Patient 25 - 49% Goal: LTG Patient will maintain dynamic standing with ADLs (OT) Description: LTG:  Patient will maintain dynamic standing balance with assist during activities of daily living (OT)  Outcome: Not Applicable   Problem: Sit to Stand Goal: LTG:  Patient will perform sit to stand in prep for activites of daily living with assistance level (OT) Description: LTG:  Patient will perform sit to stand in prep for activites of daily living with assistance level (OT) Outcome: Not Applicable   Problem: RH Eating Goal: LTG Patient will perform eating w/assist, cues/equip (OT) Description: LTG: Patient will perform eating with assist, with/without cues using equipment (OT) Outcome: Not Applicable   Problem: RH Dressing Goal: LTG Patient will perform upper body dressing (OT) Description: LTG Patient will perform upper body dressing with assist, with/without cues (OT). Flowsheets (Taken 05/18/2022 1221) LTG: Pt will perform upper body dressing with assistance level of: (downgraded 5/29- SD) Maximal Assistance - Patient 25 - 49% Goal: LTG Patient will perform lower body dressing w/assist (OT) Description: LTG: Patient will perform lower body dressing with assist, with/without cues in positioning using equipment (OT) Flowsheets (Taken 05/18/2022 1221) LTG: Pt will perform lower body dressing with assistance level of: (downgraded 5/29- SD) Total Assistance - Patient < 25%   Problem: RH Toileting Goal: LTG Patient will perform toileting task (3/3 steps) with assistance level (OT) Description:  LTG: Patient will perform toileting task (3/3 steps) with assistance level (OT)  Flowsheets (Taken 05/18/2022 1221) LTG: Pt will perform toileting task (3/3 steps) with assistance level: (downgraded 5/29- SD) Total Assistance - Patient < 25%   Problem: RH Tub/Shower Transfers Goal: LTG Patient will perform tub/shower transfers w/assist (OT) Description: LTG: Patient will perform tub/shower transfers with assist, with/without cues using equipment (OT) Outcome: Not Applicable   Problem: RH Attention Goal: LTG Patient will demonstrate this level of attention during functional activites (OT) Description: LTG:  Patient will demonstrate this level of attention during functional activites  (OT) Flowsheets (Taken 05/18/2022 1221) Patient will demonstrate this level of attention during functional activites: Sustained LTG: Patient will demonstrate this level of attention during functional activites (OT): (downgraded 5/29- SD) Maximal Assistance - Patient 25 - 49%   Problem: RH Awareness Goal: LTG: Patient will demonstrate awareness during functional activites type of (OT) Description: LTG: Patient will demonstrate awareness during functional activites type of (OT) Flowsheets (Taken 05/18/2022 1221) Patient will demonstrate awareness during functional activites type of: Intellectual LTG: Patient will demonstrate awareness during functional activites type of (OT): (downgraded 5/29- SD) Moderate Assistance - Patient 50 - 74%

## 2022-05-18 NOTE — Progress Notes (Signed)
Physical Therapy Session Note  Patient Details  Name: Carl Carpenter MRN: [id number] Date of Birth: 10/24/1986  Today's Date: 05/18/2022 PT Co-Treatment Time: 0930-1000 PT Co-Treatment Time Calculation (min): 30 min  Short Term Goals: Week 2:  PT Short Term Goal 1 (Week 2): Pt will initiate gait training. PT Short Term Goal 2 (Week 2): Pt will sit EOB to participate in therapeutic activity with modA +1. PT Short Term Goal 3 (Week 2): Pt will attend to therapy task for 5 minutes with max cues.  Skilled Therapeutic Interventions/Progress Updates:     Patient in bed wit his father and RN in the room upon PT arrival. Patient alert throughout session without signs of pain.   Patient participated in co-treat with Katharine Look OT and OT student throughout session. Focused on NMR with functional, automatic mobility for increased arousal, activation of automatic motor control, currently only in synergistic patterns, and increased upright tolerance. Patient demonstrated L lower extremity extensor pattern with activation for L limb advancement with therapeutic gait and standing activities. Patient was able to engage B upper extremities in standing and gait activities holding onto the Lite Gait handles with assist to grab, but no assist to maintain hold bilaterally. Patient tolerated treatment well and maintained focused attention with max cues and demonstrated engagement in activities performed >2 min at a time.   Therapeutic Activity: Bed Mobility: Patient performed rolling R/L to don incontinence brief and pants with max-total A +2 with activation of L hip/knee flexion and use of R hand on rails with assist. He performed supine to sit with mod-max A +2 demonstrated activation of core muscles with trunk control and use of L arm to push up to sitting, noted controversive pushing to the R upon sitting up, able to redirect L hand placement to patient's knee to improve midline posture and reduce pushing. Provided  multi-modal cues for sequencing and initiation throughout. Transfers: Patient performed a slide board transfer bed>TIS w/c and TIS w/c>mat table with max-total A +2 and total A for board placement. Provided cues for hand placement, board placement, and head-hips relationship for proper technique and decreased assist with transfers. Patient lowered to lying on mat table for Lite Gait harness placement, performed rolling and lying<>sitting with dependent assist +3.   Neuromuscular Re-ed: Patient performed the following sitting balance and lower extremity motor control activities: -sitting EOB focused on reduced controversive pushing with L hand placement and visual targets while harness was placed >6 min with mod-total A for trunk control -sit to stand in Lite Gait x3 focused on B quad and gluteal activation with facilitation -therapeutic gait training in Lite Gait >15 feet x2 with total a +4 to manage equipment and lower extremity facilitation, second trial with B DF assist ACE wraps to improve foot clearance, removed L wrap due to foot sliding into abducted position without traction from shoes  Patient in TIS w/c with his father in the room at end of session with breaks locked and all needs within reach.   Therapy Documentation Precautions:  Precautions Precautions: Fall Precaution Comments: capped trach, PEG, binder Restrictions Weight Bearing Restrictions: No   Therapy/Group: Individual Therapy  Jaquel Coomer L Zyana Amaro PT, DPT  05/18/2022, 12:49 PM

## 2022-05-18 NOTE — Progress Notes (Signed)
Occupational Therapy Weekly Progress Note  Patient Details  Name: Carl Carpenter MRN: [id number] Date of Birth: 01/28/1986  Beginning of progress report period: 05/09/22 End of progress report period: 05/18/22  Today's Date: 05/18/2022 OT Co-Treatment Time: 0900-0930 OT Co-Treatment Time Calculation (min): 30 min 9-10am Co-tx with PT Cherie.    Patient has met 0 of 4 short term goals.  Mateen has made slow progress this first week with subtle improvements in arousal, volitional movements/command following, and OOB tolerance. He is following commands 25% of the time sporadically. He has demonstrated volitional movement in his RUE occasionally and more consistently in his LUE. He remains as a RLAS III level cognitively. His family is constantly present and very supportive. Family education will take place closer to d/c.   Patient continues to demonstrate the following deficits: muscle weakness, decreased cardiorespiratoy endurance, decreased midline orientation, decreased attention to right, and decreased motor planning, decreased initiation, decreased attention, decreased awareness, decreased problem solving, decreased safety awareness, decreased memory, delayed processing, and demonstrates behaviors consistent with Rancho Level III, and decreased sitting balance, decreased standing balance, decreased postural control, hemiplegia, and decreased balance strategies and therefore will continue to benefit from skilled OT intervention to enhance overall performance with BADL and Reduce care partner burden.  Patient not progressing toward long term goals.  See goal revision..  Plan of care revisions: POC adjusted to reflect realistic goals given RLAS III level for over 2 months.  OT Short Term Goals Week 1:  OT Short Term Goal 1 (Week 1): Patient will attend to stimuli presented in central vision for 1 min indicting improved arousal/attention. OT Short Term Goal 1 - Progress (Week 1): Not met OT Short  Term Goal 2 (Week 1): Patient will maintain static sitting balance at EOB with Mod A for >5 min in prep for ADLs. OT Short Term Goal 2 - Progress (Week 1): Not met OT Short Term Goal 3 (Week 1): Patient will complete sit to stand transfer with Max A +2 in prep for ADLs. OT Short Term Goal 3 - Progress (Week 1): Not met OT Short Term Goal 4 (Week 1): Patient will follow 1-step verbal commands with 25% accuracy during 3 consecutive treatment sessions in prep for ADLs. OT Short Term Goal 4 - Progress (Week 1): Progressing toward goal Week 2:  OT Short Term Goal 1 (Week 2): Pt will maintain arousal for 2 consecutive OT sessions OT Short Term Goal 2 (Week 2): Pt will initiate 25% of commands during ADLs with max cueing OT Short Term Goal 3 (Week 2): Pt will complete 1 simple ADL task with max A OT Short Term Goal 4 (Week 2): Pt will maintain head control EOB with min A  Skilled Therapeutic Interventions/Progress Updates:    Co-tx with PT. OT goals addressed included trunk/postural control, arousal, attention to task, and command following. Pt supine with no indications of pain however already sweating above the neck. He required total A +2 to change brief and pants. He required total +2 assist to come to EOB. Improved head control. Manual facilitation required for LUE placement, as he often gets a grasp of something and will not let go. He required total A +2 to transfer to the TIS w/c via SB. Pt taken via w/c to the therapy gym. He was transferred to the mat to don [place]. See PT note for gait details. Litegait used to promote improved trunk and head control, attention to task, reciprocal movement, and arousal. OT providing total  A for RLE/LE (switching with PT) leg stance. Pt with improved head control in standing. Max multimodal cueing for attention to task and initiation. He returned to his room and was left sitting up in the TIS w/c with all needs met. Father present.   Therapy  Documentation Precautions:  Precautions Precautions: Fall Precaution Comments: capped trach, PEG, binder Restrictions Weight Bearing Restrictions: No    Therapy/Group: Co-Treatment  [name] 05/18/2022, 7:29 AM

## 2022-05-18 NOTE — Progress Notes (Signed)
Occupational Therapy TBI Note  Patient Details  Name: Carl Carpenter MRN: [id number] Date of Birth: [birth year]  Today's Date: 05/18/2022 OT Individual Time: 1050-1135 OT Individual Time Calculation (min): 45 min    Short Term Goals: Week 2:  OT Short Term Goal 1 (Week 2): Pt will maintain arousal for 2 consecutive OT sessions OT Short Term Goal 2 (Week 2): Pt will initiate 25% of commands during ADLs with max cueing OT Short Term Goal 3 (Week 2): Pt will complete 1 simple ADL task with max A OT Short Term Goal 4 (Week 2): Pt will maintain head control EOB with min A  Skilled Therapeutic Interventions/Progress Updates:    Pt received sitting up in the TIS w/c. No indications of pain. He was alert but lethargic. He was taken outside to improve mood/attention to more natural environment and for fresh air. He required max A for head control when TIS was tilted forward. Attempted to complete catching/throwing activity but pt with no initiation and eyes closing. Next attempted closed chain BUE reaching with bimanual grasp on a 2lb dowel. This increased pt alertness and attention to task significantly. He had terminal LUE tricep extension activation but required max facilitation on the RUE to maintain grasp. He was taken inside d/t arousal again fading. He was transferred back to bed with total A +2 via SB. Total A +2 brief change with urine and small BM incontinence. He was left supine, already asleep. Father present. Stimuli in room minimized and education on reducing sensory stimuli for pt to rest provided to father. 30 min missed d/t fatigue.   Therapy Documentation Precautions:  Precautions Precautions: Fall Precaution Comments: capped trach, PEG, binder Restrictions Weight Bearing Restrictions: No General: General OT Amount of Missed Time: 30 Minutes    Therapy/Group: Individual Therapy  [name] 05/18/2022, 12:11 PM

## 2022-05-18 NOTE — Progress Notes (Signed)
Received report from Hilary at 1415 

## 2022-05-18 NOTE — Progress Notes (Signed)
Speech Language Pathology TBI Note  Patient Details  Name: Carl Carpenter MRN: [id number] Date of Birth: [birth year]  Today's Date: 05/18/2022 SLP Individual Time: 9323-5573 SLP Individual Time Calculation (min): 55 min  Short Term Goals: Week 2: SLP Short Term Goal 1 (Week 2): Pt will consume therapeutic trials of 1/2 teaspoons of thin liquids and ice chips with minimal overt s/s of aspiration over 3 consecutive sessions and max assist use of swallowing precautions prior to MBS or FEES. SLP Short Term Goal 2 (Week 2): Pt will vocalize on command in 25% of opportunities with max assist multimodal cues. SLP Short Term Goal 3 (Week 2): Pt will answer basic yes/no questions using written choices on a white board and either eye gaze or gesturing in 50% of opportunities with max assist multimodal cues. SLP Short Term Goal 4 (Week 2): Pt will follow 1 step  commands in  50% of opportunities with max assist multimodal cues. SLP Short Term Goal 5 (Week 2): Pt will focus his attention on a targeted stimulus for 60 seconds with max assist multimodal cues.  Skilled Therapeutic Interventions: Skilled treatment session focused on dysphagia, cognitive and speech goals. Upon arrival, patient was incontinent of urine. SLP facilitated session by providing total A +2 assist for bed mobility while donning a new brief. SLP also facilitated session by providing total A for oral care via the suction toothbrush. SLP provided trials of small ice chips. Patient required total A to open and close his oral cavity and required more than a reasonable amount of time and Max A multimodal cues to initiate oral manipulation and to initiate a swallow response. Patient with consistent overt s/s of aspiration, suspect due to severe delay in swallow initiation which resulted in consistent coughing.  SLP attempted to use yes/no communication boards to elicit a response without success despite Max A multimodal cues. SLP also provided Max A  multimodal cues for patient to vocalize on command. Patient able to "hum" on command in 25% of opportunities with increased success when patient initiated clearing his throat first. Patient's girlfriend present and provided support and encouragement. Patient left upright in bed with girlfriend present. Continue with current plan of care.      Pain No indications of pain   Therapy/Group: Individual Therapy  Raeanne Deschler 05/18/2022, 3:16 PM

## 2022-05-18 NOTE — Progress Notes (Signed)
PROGRESS NOTE   Subjective/Complaints: Pt had a good weekend per dad. Remains fairly alert. Making eye contact. Dad notices some sweating last night  ROS: Limited due to cognitive/behavioral    Objective:   No results found. Recent Labs    05/18/22 0555  WBC 5.2  HGB 14.1  HCT 41.2  PLT 206    Recent Labs    05/18/22 0555  NA 134*  K 4.0  CL 100  CO2 25  GLUCOSE 103*  BUN 17  CREATININE 0.79  CALCIUM 9.8     Intake/Output Summary (Last 24 hours) at 05/18/2022 1021 Last data filed at 05/18/2022 0425 Gross per 24 hour  Intake --  Output 1875 ml  Net -1875 ml         Physical Exam: Vital Signs Blood pressure 136/87, pulse 99, temperature (!) 97.4 F (36.3 C), resp. rate 18, height 6\' 5"  (1.956 m), weight 107.3 kg, SpO2 96 %.  Constitutional: No distress . Vital signs reviewed. HEENT: NCAT, EOMI, oral membranes moist Neck: supple Cardiovascular: RRR without murmur. No JVD    Respiratory/Chest: CTA Bilaterally without wheezes or rales. Normal effort    GI/Abdomen: BS +, non-tender, non-distended Ext: no clubbing, cyanosis, or edema Psych: flat Musculoskeletal:        no pain with ROM  Skin:    Comments: Abrasion left knee present, scalp, bilateral heels have foam dressing.  Sacral dressing present. Neurological:     Comments: tracks me with eyes more on the left than right side. Squeezes with left hand. Decreased LT all 4's.  DTR's brisk on right.   Possible mild flexor tone in right hand.   Assessment/Plan: 1. Functional deficits which require 3+ hours per day of interdisciplinary therapy in a comprehensive inpatient rehab setting. Physiatrist is providing close team supervision and 24 hour management of active medical problems listed below. Physiatrist and rehab team continue to assess barriers to discharge/monitor patient progress toward functional and medical goals  Care Tool:  Bathing     Body parts bathed by patient: Right arm, Left arm, Chest, Abdomen, Front perineal area, Buttocks, Right upper leg, Left upper leg, Right lower leg, Left lower leg         Bathing assist Assist Level: Dependent - Patient 0%     Upper Body Dressing/Undressing Upper body dressing   What is the patient wearing?: Pull over shirt    Upper body assist Assist Level: Dependent - Patient 0%    Lower Body Dressing/Undressing Lower body dressing      What is the patient wearing?: Pants, Incontinence brief     Lower body assist Assist for lower body dressing: Total Assistance - Patient < 25%     Toileting Toileting    Toileting assist Assist for toileting: Dependent - Patient 0%     Transfers Chair/bed transfer  Transfers assist  Chair/bed transfer activity did not occur: Safety/medical concerns  Chair/bed transfer assist level: Dependent - mechanical lift     Locomotion Ambulation   Ambulation assist   Ambulation activity did not occur: Safety/medical concerns          Walk 10 feet activity   Assist  Walk 10  feet activity did not occur: Safety/medical concerns        Walk 50 feet activity   Assist Walk 50 feet with 2 turns activity did not occur: Safety/medical concerns         Walk 150 feet activity   Assist Walk 150 feet activity did not occur: Safety/medical concerns         Walk 10 feet on uneven surface  activity   Assist Walk 10 feet on uneven surfaces activity did not occur: Safety/medical concerns         Wheelchair     Assist Is the patient using a wheelchair?: Yes Type of Wheelchair: Manual    Wheelchair assist level: Dependent - Patient 0%      Wheelchair 50 feet with 2 turns activity    Assist        Assist Level: Dependent - Patient 0%   Wheelchair 150 feet activity     Assist      Assist Level: Dependent - Patient 0%   Blood pressure 136/87, pulse 99, temperature (!) 97.4 F (36.3 C), resp.  rate 18, height 6\' 5"  (1.956 m), weight 107.3 kg, SpO2 96 %.    Medical Problem List and Plan: 1. 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  DVT right posterior tibial and peroneal veins (03/22/22): -anticoagulation:  Pharmaceutical: Other (comment) Eliquis             -antiplatelet therapy: none 3. Pain: continue Tylenol prn 4. Mood: LCSW to evaluate and provide emotional support             -antipsychotic agents: none 5. Impaired attention: This patient is not capable of making decisions on his own behalf.             -continue amantadine 200mg  bid             -5/29 maintain ritalin at 20mg  bid                6. Dysphagia. S/p PEG 3/29.  --NPO -continue TF -bumper is very mobile, adjust as needed -completing diflucan for redness around peg and mild thrush 5/29 Continue zinc oxide to protect skin surrounding PEG tube 7. Fluids/Electrolytes/Nutrition: Routine Is and Os and follow-up chemistries             --continue tube feeds Jevity 1.5 at 80 mL/hr             --continue Prosource             --continue regular oral care  -following weights, check prealbumin Monday             5/29 prealbumin looks great 35.8 Filed Weights   05/12/22 0500 05/13/22 0840  Weight: 103 kg 107.3 kg    9: C1 right transverse process fracture--he's out of collar 10: Left Occipital condyle fracture--out of collar 11: Acute ventilator respiratory failure requiring trach and PEG 3/29              --  decannulated , stoma closed             --continue Robitussin 25 mL q 4 hours  -continue robinul via PEG  - stoma closed  -5/29 cough better, hes's afebrile  12: Right ear  laceration:  healing 14: Tongue laceration: monitor healing 15: Hypertension: continue Lopressor 50 mg BID             -well controlled 5/26 5/29 BP well controlled    05/18/2022    8:00 AM 05/18/2022    4:12 AM 05/17/2022    7:41 PM  Vitals with BMI  Systolic  136 123  Diastolic  87 89  Pulse 99 86 100    16. Hyperglycemia secondary to tube feeds: continue to monitor    18. Iron deficiency:  daily iron supplement added.  19. Tremor,?clonus: continue trial of propranolol 10mg  tid  5/29 Continue propranolol 10 mg tid  LOS: 10 days A FACE TO FACE EVALUATION WAS PERFORMED  Ranelle OysterZachary T Akeel Reffner 05/18/2022, 10:21 AM

## 2022-05-18 NOTE — Progress Notes (Signed)
[name] (dad) able to administer medication through peg tube with verbal cues. Assisted with site care.

## 2022-05-19 ENCOUNTER — Inpatient Hospital Stay (HOSPITAL_COMMUNITY): Payer: Medicaid Other

## 2022-05-19 LAB — GLUCOSE, CAPILLARY
Glucose-Capillary: 101 mg/dL — ABNORMAL HIGH (ref 70–99)
Glucose-Capillary: 107 mg/dL — ABNORMAL HIGH (ref 70–99)
Glucose-Capillary: 126 mg/dL — ABNORMAL HIGH (ref 70–99)
Glucose-Capillary: 148 mg/dL — ABNORMAL HIGH (ref 70–99)
Glucose-Capillary: 87 mg/dL (ref 70–99)
Glucose-Capillary: 88 mg/dL (ref 70–99)

## 2022-05-19 MED ORDER — DIATRIZOATE MEGLUMINE & SODIUM 66-10 % PO SOLN
ORAL | Status: AC
  Administered 2022-05-19: 30 mL via GASTROSTOMY
  Filled 2022-05-19: qty 30

## 2022-05-19 MED ORDER — IOHEXOL 300 MG/ML  SOLN
30.0000 mL | Freq: Once | INTRAMUSCULAR | Status: AC | PRN
Start: 2022-05-19 — End: 2022-05-19
  Administered 2022-05-19: 30 mL

## 2022-05-19 NOTE — Procedures (Signed)
Patient seen at bedside for gastrostomy tube site replacement procedure.  Foley catheter currently in appropriate position with the stomach. Foley easily removed after the balloon was deflated. 22  Fr G tube removed using gentle traction. New tube inserted with out difficulty 9 ml of normal saline instilled in the retention balloon port and the flange repositioned for comfort. Stomach contents noted in the g astrostomy tube.   Confirmation abdominal xray confirming placement pending. Once confirmed  Ok to begin using g-tube for medicines, tube feeds, free water, etc.  Please call IR for any questions or concerns regarding the g-tube.

## 2022-05-19 NOTE — Progress Notes (Signed)
Speech Language Pathology Daily Session Note  Patient Details  Name: Godwin Tedesco MRN: [id number] Date of Birth: [birth year]  Today's Date: 05/19/2022 SLP Individual Time: 0900-0927 SLP Individual Time Calculation (min): 27 min and Today's Date: 05/19/2022 SLP Missed Time: 33 Minutes Missed Time Reason: X-ray (STAT X-ray ordered)  Short Term Goals: Week 2: SLP Short Term Goal 1 (Week 2): Pt will consume therapeutic trials of 1/2 teaspoons of thin liquids and ice chips with minimal overt s/s of aspiration over 3 consecutive sessions and max assist use of swallowing precautions prior to MBS or FEES. SLP Short Term Goal 2 (Week 2): Pt will vocalize on command in 25% of opportunities with max assist multimodal cues. SLP Short Term Goal 3 (Week 2): Pt will answer basic yes/no questions using written choices on a white board and either eye gaze or gesturing in 50% of opportunities with max assist multimodal cues. SLP Short Term Goal 4 (Week 2): Pt will follow 1 step  commands in  50% of opportunities with max assist multimodal cues. SLP Short Term Goal 5 (Week 2): Pt will focus his attention on a targeted stimulus for 60 seconds with max assist multimodal cues.  Skilled Therapeutic Interventions: Skilled treatment session focused on cognitive goals. Upon arrival, patient was awake while upright in the wheelchair. Patient with saliva pooling in oral cavity that patient did not attempt to swallow despite Max A multimodal cues. Therefore, SLP provided oral suctioning. SLP provided Max A multimodal cues for patient to phonate on command without success. While SLP was transporting the patient to the SLP office, radiology came to room to administer a STAT X-ray. Patient transferred back to bed via the [facility]. Due to X-ray, patient missed remaining 30 minutes of session. Will re-attempt as able. Continue with current plan of care.      Pain No indications of pain   Therapy/Group: Individual  Therapy  Damean Poffenberger 05/19/2022, 3:17 PM

## 2022-05-19 NOTE — Progress Notes (Signed)
Patient ID: Carl Carpenter, male   DOB: 1986/04/15, 36 y.o.   MRN: [id number]  SW met with pt significant other [name] to provide updates from team conference, d/c date remains 4+weeks.  Loralee Pacas, MSW, [place] Office: [phone] Cell: [phone] Fax: [phone]

## 2022-05-19 NOTE — Progress Notes (Signed)
Physical Therapy Session Note  Patient Details  Name: Carl Carpenter MRN: [id number] Date of Birth: [birth year]  Today's Date: 05/19/2022 PT Individual Time: 1255-1400 PT Individual Time Calculation (min): 65 min   Short Term Goals: Week 2:  PT Short Term Goal 1 (Week 2): Pt will initiate gait training. PT Short Term Goal 2 (Week 2): Pt will sit EOB to participate in therapeutic activity with modA +1. PT Short Term Goal 3 (Week 2): Pt will attend to therapy task for 5 minutes with max cues.  Skilled Therapeutic Interventions/Progress Updates:     Patient in bed with his fiance at bedside upon PT arrival. Patient alert throughout session without signs of pain.   Focused beginning of session on w/c assessment with Barbara Cower, ATP from stalls for larger TIS for improved sitting posture, tolerance, and reduced risk for skin breakdown. Barbara Cower to bring loaner TIS w/c for patient this week. Focused remainder of session on NMR with functional, automatic mobility for increased arousal, activation of automatic motor control, currently only in synergistic patterns, and increased upright tolerance. Patient demonstrated L lower extremity extensor pattern with intermittent activation for B limb advancement with therapeutic gait and standing activities. Patient was able to engage B upper extremities in standing and gait activities holding onto the [place] Carley Hammed) walker with max-total assist to grab, but no assist to maintain hold bilaterally. Patient tolerated treatment well and maintained focused attention with max cues.  Therapeutic Activity: He performed supine to sit with mod-max A +2 demonstrated activation of core muscles with trunk control and use of L arm to push up to sitting, noted controversive pushing to the R upon sitting up, able to redirect L hand placement to patient's knee to improve midline posture and reduce pushing. Provided multi-modal cues for sequencing and initiation throughout. Transfers:  Patient performed a slide board transfer bed>TIS w/c and TIS w/c>mat table with max-mod A +2 and total A for board placement. Provided cues for hand placement, board placement, and head-hips relationship for proper technique and decreased assist with transfers.  Neuromuscular Re-ed: Patient performed the following motor control activities: -performed multiple trials of grasp and release with different objects using his R hand and intermittently with his L, required increased time and cues for R hand release with immediate grasp indicative of palmar grasp reflex -sitting EOB focused on reduced controversive pushing with L hand placement and visual targets >3 min with mod-total A for trunk control -sit to stand with 3 musketeer technique x1 and using [place] Marketing executive) walker x3 focused on B quad and gluteal activation with facilitation for forward weight shift and arm/hand placement with max progressing to mod A +2 -therapeutic gait training with [place] walker >15 feet x2 with max A +2 facilitation for trunk control, device management, and weight shifting, and +3 for total with intermittent mod A for limb advancement -performed single one-step commands for R foot placement throughout session with >50% accuracy with increased time for initiation  Patient in bed with his father and fiance at bedside at end of session with breaks locked, 4 rails up per family request for safety, and all needs within reach.   Therapy Documentation Precautions:  Precautions Precautions: Fall Precaution Comments: capped trach, PEG, binder Restrictions Weight Bearing Restrictions: No    Therapy/Group: Individual Therapy  Hilario Robarts L Guerin Lashomb PT, DPT  05/19/2022, 8:51 PM

## 2022-05-19 NOTE — Progress Notes (Signed)
During rounding patient noted to have pulled out peg tube under abdominal binder. Harvel Ricks PA notified. New order to replace with foley. 22Fr foley placed in tract. Patient tolerated well. Order placed for KUB to confirm placement. Holding meds and tube feed until placement confirmed.

## 2022-05-19 NOTE — Patient Care Conference (Signed)
Inpatient RehabilitationTeam Conference and Plan of Care Update Date: 05/19/2022   Time: 10:41 AM    Patient Name: Armarion Greek      Medical Record Number: [id number]  Date of Birth: 23-Aug-1986 Sex: Male         Room/Bed: 4W14C/4W14C-01 Payor Info: Payor: MEDICAID [place] / Plan: MEDICAID -FAMILY PLANNING / Product Type: *No Product type* /    Admit Date/Time:  05/08/2022  3:31 PM  Primary Diagnosis:  TBI (traumatic brain injury) [facility])  Hospital Problems: Principal Problem:   TBI (traumatic brain injury) (HCC) Active Problems:   Malnutrition of moderate degree    Expected Discharge Date: Expected Discharge Date:  (4+ weeks)  Team Members Present: Physician leading conference: Dr. Faith Rogue Social Worker Present: Cecile Sheerer, LCSWA Nurse Present: Kennyth Arnold, RN PT Present: Serina Cowper, PT OT Present: Kearney Hard, OT SLP Present: Feliberto Gottron, SLP PPS Coordinator present : Fae Pippin, SLP     Current Status/Progress Goal Weekly Team Focus  Bowel/Bladder   incont B/B LBM 5/27  Regain continence  Offer timed toilting assist when cognition improves. Continue male purewick   Swallow/Nutrition/ Hydration   NPO with PEG  Mod A with least restrictive diet  trials of ice chips, oral manipulation, swallow initation-assess readiness for MBS   ADL's   RLAS III, subtle improvements with command following- have seen him do a simple grooming task with as little as mod cueing in a controlled environment with mod-max cueing. Improved head control when not fatigued. Fatigues quickly. Barriers to d/c are burden of care- pt will likely be at dependent level.  Mod-max A overall  Simple command following, arousal, attention,   Mobility   max-total A +2 all mobility, initiated therapeutic gait with initiation of stepping with L with motor activation in synergistic patterns L>R  maxA bed level  sitting balance, attention, visual tracking, functional transfers, standing  tolerance, therapeutic gait, activity tolerance, patient/caregiver education   Communication   Nonverbal, Max-Total A for multimodal communication  Mod A  establishing basic form of communiction to express wants/needs, purposeful phonation   Safety/Cognition/ Behavioral Observations  [place] Level IV-Total A  Mod A  focused attention, visual attention/tracking, following commnads with purposeful behaviors   Pain   no reported pain  No noverbal pain indicators noted  Assess pain q 4hr and PRN   Skin   Pink abrasion noted to Lt knee and posterior head. Rash to face healing. Redness around peg site improved. Continuing zinc oxide treatment  Skin to remain intact  Assess skin q shift and PRN     Discharge Planning:  Pt is uninsured.  Pt will d/c to home with significant other and will have 24/7 care from her, and PRN support from other family members.   Team Discussion: Making some gains. More alert, interactive. Pulled PEG out this morning. Scheduled to be replaced. Spasticity/tone to right side. Family doing well with ROM. Improved head control, able to clear throat. Incontinent B/B, no reported pain. Will schedule family meeting when more appropriate. Following commands.  Patient on target to meet rehab goals: Mod/max assist goals. Currently non-verbal, working with Public affairs consultant. Initiated stepping, showing potential with lower extremities. Attending to task well.  *See Care Plan and progress notes for long and short-term goals.   Revisions to Treatment Plan:  Continue to adjust medications as appropriate.   Teaching Needs: Family education, medication management, dietary management, bowel/bladder management, safety awareness, transfer training, etc.   Current Barriers to Discharge: Decreased caregiver  support, Home enviroment access/layout, Incontinence, Lack of/limited family support, Insurance for SNF coverage, and burden of care.  Possible Resolutions to Barriers: Family  education Assess for possible SNF placement Follow-up therapy Order recommended DME     Medical Summary Current Status: more alert and interactive. still npo, pulled out PEG. spasticity controlled, still RLAS III/IV  Barriers to Discharge: Medical stability   Possible Resolutions to Barriers/Weekly Focus: daily arousal and stimluation of pt, replace peg.   Continued Need for Acute Rehabilitation Level of Care: The patient requires daily medical management by a physician with specialized training in physical medicine and rehabilitation for the following reasons: Direction of a multidisciplinary physical rehabilitation program to maximize functional independence : Yes Medical management of patient stability for increased activity during participation in an intensive rehabilitation regime.: Yes Analysis of laboratory values and/or radiology reports with any subsequent need for medication adjustment and/or medical intervention. : Yes   I attest that I was present, lead the team conference, and concur with the assessment and plan of the team.   Kennyth Arnold G 05/19/2022, 3:11 PM

## 2022-05-19 NOTE — Plan of Care (Signed)
  Problem: Consults Goal: RH BRAIN INJURY PATIENT EDUCATION Description: Description: See Patient Education module for eduction specifics Outcome: Progressing Goal: Skin Care Protocol Initiated - if Braden Score 18 or less Description: If consults are not indicated, leave blank or document N/A Outcome: Progressing Goal: Nutrition Consult-if indicated Outcome: Progressing   Problem: RH BOWEL ELIMINATION Goal: RH STG MANAGE BOWEL WITH ASSISTANCE Description: STG Manage Bowel with Mod Assistance. Outcome: Progressing Goal: RH STG MANAGE BOWEL W/MEDICATION W/ASSISTANCE Description: STG Manage Bowel with Medication with Mod Assistance. Outcome: Progressing   Problem: RH BLADDER ELIMINATION Goal: RH STG MANAGE BLADDER WITH ASSISTANCE Description: STG Manage Bladder With Mod Assistance Outcome: Progressing Goal: RH STG MANAGE BLADDER WITH MEDICATION WITH ASSISTANCE Description: STG Manage Bladder With Medication With Mod Assistance. Outcome: Progressing   Problem: RH SKIN INTEGRITY Goal: RH STG MAINTAIN SKIN INTEGRITY WITH ASSISTANCE Description: STG Maintain Skin Integrity With Mod Assistance. Outcome: Progressing Goal: RH STG ABLE TO PERFORM INCISION/WOUND CARE W/ASSISTANCE Description: STG Able To Perform Incision/Wound Care With Mod Assistance. Outcome: Progressing   Problem: RH SAFETY Goal: RH STG ADHERE TO SAFETY PRECAUTIONS W/ASSISTANCE/DEVICE Description: STG Adhere to Safety Precautions With Cues and Reminders. Outcome: Progressing Goal: RH STG DECREASED RISK OF FALL WITH ASSISTANCE Description: STG Decreased Risk of Fall With Mod Assistance. Outcome: Progressing   Problem: RH COGNITION-NURSING Goal: RH STG USES MEMORY AIDS/STRATEGIES W/ASSIST TO PROBLEM SOLVE Description: STG Uses Memory Aids/Strategies With Mod Assistance to Problem Solve. Outcome: Progressing Goal: RH STG ANTICIPATES NEEDS/CALLS FOR ASSIST W/ASSIST/CUES Description: STG Anticipates Needs/Calls for  Assist With Cues and Reminers. Outcome: Progressing   Problem: RH PAIN MANAGEMENT Goal: RH STG PAIN MANAGED AT OR BELOW PT'S PAIN GOAL Description: < 3 on a 0-10 pain scale. Outcome: Progressing   Problem: RH KNOWLEDGE DEFICIT BRAIN INJURY Goal: RH STG INCREASE KNOWLEDGE OF SELF CARE AFTER BRAIN INJURY Description: Patient and family will demonstrate knowledge of self-care management, medication/pain management, skin/wound care with educational materials and handouts provided by staff with mod assist at discharge. Outcome: Progressing Goal: RH STG INCREASE KNOWLEDGE OF DYSPHAGIA/FLUID INTAKE Description: Patient and family will demonstrate knowledge of dysphagia diets, thickened liquids, safe swallowing techniques with educational materials and handouts provided by staff with mod assist at discharge. Outcome: Progressing   

## 2022-05-19 NOTE — Progress Notes (Signed)
PROGRESS NOTE   Subjective/Complaints: Pt more alert. Started pulling on PEG and pulled it out. Replaced with foley  ROS: Limited due to cognitive/behavioral    Objective:   DG ABDOMEN PEG TUBE LOCATION  Result Date: 05/19/2022 CLINICAL DATA:  Feeding tube placement. EXAM: ABDOMEN - 1 VIEW COMPARISON:  03/24/2022 FINDINGS: The feeding tube tip is in the body region of the stomach. No extravasation of contrast material is identified. Moderate stool noted throughout the colon. IMPRESSION: Feeding tube tip is in the body region of the stomach. Electronically Signed   By: Rudie Meyer M.D.   On: 05/19/2022 09:54   Recent Labs    05/18/22 0555  WBC 5.2  HGB 14.1  HCT 41.2  PLT 206    Recent Labs    05/18/22 0555  NA 134*  K 4.0  CL 100  CO2 25  GLUCOSE 103*  BUN 17  CREATININE 0.79  CALCIUM 9.8     Intake/Output Summary (Last 24 hours) at 05/19/2022 1136 Last data filed at 05/19/2022 0735 Gross per 24 hour  Intake 0 ml  Output --  Net 0 ml         Physical Exam: Vital Signs Blood pressure 115/88, pulse 78, temperature 97.7 F (36.5 C), resp. rate 17, height 6\' 5"  (1.956 m), weight 107.3 kg, SpO2 95 %.  Constitutional: No distress . Vital signs reviewed. HEENT: NCAT, EOMI, oral membranes moist Neck: supple Cardiovascular: RRR without murmur. No JVD    Respiratory/Chest: CTA Bilaterally without wheezes or rales. Normal effort    GI/Abdomen: BS +, non-tender, non-distended Ext: no clubbing, cyanosis, or edema Psych: pleasant and cooperative  Musculoskeletal:        no pain with ROM  Skin:    Comments: Abrasion left knee present, scalp, bilateral heels have foam dressing.  Sacral dressing present. Neurological:     Comments: better eye contact. Inconsistent with command following. Engaging more with right hand and leg.   DTR's brisk on right.   Possible mild flexor tone in right  hand.   Assessment/Plan: 1. Functional deficits which require 3+ hours per day of interdisciplinary therapy in a comprehensive inpatient rehab setting. Physiatrist is providing close team supervision and 24 hour management of active medical problems listed below. Physiatrist and rehab team continue to assess barriers to discharge/monitor patient progress toward functional and medical goals  Care Tool:  Bathing    Body parts bathed by patient: Right arm, Left arm, Chest, Abdomen, Front perineal area, Buttocks, Right upper leg, Left upper leg, Right lower leg, Left lower leg   Body parts bathed by helper: Right arm, Right lower leg, Left lower leg, Chest, Left arm, Front perineal area, Buttocks, Right upper leg, Left upper leg, Abdomen, Face     Bathing assist Assist Level: 2 Helpers     Upper Body Dressing/Undressing Upper body dressing   What is the patient wearing?: Pull over shirt    Upper body assist Assist Level: 2 Helpers    Lower Body Dressing/Undressing Lower body dressing      What is the patient wearing?: Pants, Incontinence brief     Lower body assist Assist for lower body dressing: 2  Helpers     Orthoptist for toileting: 2 Helpers     Transfers Chair/bed transfer  Transfers assist  Chair/bed transfer activity did not occur: Safety/medical concerns  Chair/bed transfer assist level: 2 Helpers     Locomotion Ambulation   Ambulation assist   Ambulation activity did not occur: Safety/medical concerns          Walk 10 feet activity   Assist  Walk 10 feet activity did not occur: Safety/medical concerns        Walk 50 feet activity   Assist Walk 50 feet with 2 turns activity did not occur: Safety/medical concerns         Walk 150 feet activity   Assist Walk 150 feet activity did not occur: Safety/medical concerns         Walk 10 feet on uneven surface  activity   Assist Walk 10 feet on  uneven surfaces activity did not occur: Safety/medical concerns         Wheelchair     Assist Is the patient using a wheelchair?: Yes Type of Wheelchair: Manual    Wheelchair assist level: Dependent - Patient 0%      Wheelchair 50 feet with 2 turns activity    Assist        Assist Level: Dependent - Patient 0%   Wheelchair 150 feet activity     Assist      Assist Level: Dependent - Patient 0%   Blood pressure 115/88, pulse 78, temperature 97.7 F (36.5 C), resp. rate 17, height 6\' 5"  (1.956 m), weight 107.3 kg, SpO2 95 %.    Medical Problem List and Plan: 1. 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 Interdisciplinary team conference today to discuss goals, barriers to discharge, and dc planning.   2.  DVT right posterior tibial and peroneal veins (03/22/22): -anticoagulation:  Pharmaceutical: Other (comment) Eliquis             -antiplatelet therapy: none 3. Pain: continue Tylenol prn 4. Mood: LCSW to evaluate and provide emotional support             -antipsychotic agents: none 5. Impaired attention: This patient is not capable of making decisions on his own behalf.             -continue amantadine 200mg  bid             -continue ritalin at 20mg  bid                6. Dysphagia. S/p PEG 3/29.  --NPO -continue TF -bumper is very mobile, adjust as needed -completing diflucan for redness around peg and mild thrush  Continue zinc oxide to protect skin surrounding PEG tube -5/30 replace PEG under fluoro. Foley for now 7. Fluids/Electrolytes/Nutrition: Routine Is and Os and follow-up chemistries             --continue tube feeds Jevity  1.5 at 80 mL/hr             --  continue Prosource             --continue regular oral care  5/29 prealbumin looks great 35.8 Filed Weights   05/12/22 0500 05/13/22 0840  Weight: 103 kg 107.3 kg    9: C1 right transverse process fracture--he's out of collar 10: Left Occipital condyle fracture--out of collar 11: Acute ventilator respiratory failure requiring trach and PEG 3/29              --decannulated , stoma closed             --continue Robitussin 25 mL q 4 hours  -continue robinul via PEG  - stoma closed  -5/29-30 cough better, hes's afebrile  12: Right ear laceration:  healing 14: Tongue laceration: monitor healing 15: Hypertension: continue Lopressor 50 mg BID             -well controlled 5/26 5/30 bp's reasonable    05/19/2022    4:26 AM 05/18/2022    7:36 PM 05/18/2022    3:19 PM  Vitals with BMI  Systolic 115 123 [id number]  Diastolic 88 80 77  Pulse 78 87 91    16. Hyperglycemia secondary to tube feeds: continue to monitor    18. Iron deficiency:  daily iron supplement added.  19. Tremor,?clonus: continue trial of propranolol 10mg  tid  5/29 Continue propranolol 10 mg tid  LOS: 11 days A FACE TO FACE EVALUATION WAS PERFORMED  Ranelle OysterZachary T Shantel Helwig 05/19/2022, 11:36 AM

## 2022-05-19 NOTE — Progress Notes (Signed)
Occupational Therapy TBI Note  Patient Details  Name: Carl Carpenter MRN: [id number] Date of Birth: 05-12-1986  Session 1 Today's Date: 05/19/2022 OT Individual Time: 0730-0800 OT Individual Time Calculation (min): 30 min    Session 2 Today's Date: 05/19/2022 OT Individual Time: 6073-7106 OT Individual Time Calculation (min): 58 min    Short Term Goals: Week 2:  OT Short Term Goal 1 (Week 2): Pt will maintain arousal for 2 consecutive OT sessions OT Short Term Goal 2 (Week 2): Pt will initiate 25% of commands during ADLs with max cueing OT Short Term Goal 3 (Week 2): Pt will complete 1 simple ADL task with max A OT Short Term Goal 4 (Week 2): Pt will maintain head control EOB with min A  Skilled Therapeutic Interventions/Progress Updates:    Session 1 Pt received supine with eyes closed and resting comfortably, no indications of pain, SO [name] present. She reported pt was more calm after pulling out g-tube.  Discussed d/c planning and need for ramp. Pt arousal was increased with initiation of bed mobility. Total A +2 for rolling R/L to ensure pt brief clean and dry. Primo fit removed with adhesive remover wipes. Pt was transferred to EOB with total A +2. He required max multimodal cueing for head control. Total A +2 for slideboard transfer to the wc- pt with appropriate hand placement on the slideboard with only min facilitation. In front of the sink, worked on initiation and focused attention to task. He required max HOH to initiate ADL tasks. Increased coughing this morning but no production of secretions. Thorough oral care completed- tongue depressor helpful to facilitate mouth opening. He was left sitting up in the TIS w/c with his girlfriend present, all needs met.    Session 2 Pt received supine with no indications of pain. Session focused on trunk and head control, arousal, attention to task, and BUE integration. Slideboard transfer to the w/c with total A +2. Worked on standing  with +4 support- one person on either side of the trunk, one at the knees and then one person facilitating chest opening/lifting. He required max to remain standing with facilitation required from all 4 assisting. Mirror placed anteriorly to promote visual attention to task and midline awareness. 2 trials completed. He was then transferred to the mat with SB- total A +2. OT student posteriorly supporting pt trunk with a large exercise ball, requiring total A for trunk management. Worked on initiation and command following with simple ball catching/throwing task. Pt demonstrated two instances of B grasp on the ball with elbow flexion- great activation of the RUE ! He started having more difficulty maintaining arousal and was transferred back to the chair, and then to supine in bed. Positioned pt in semi-sidelying in bed with pillows for pressure relief and comfort. Pt left supine with all needs met, SO [name] present.    Therapy Documentation Precautions:  Precautions Precautions: Fall Precaution Comments: capped trach, PEG, binder Restrictions Weight Bearing Restrictions: No   Therapy/Group: Individual Therapy  [name] 05/19/2022, 6:45 AM

## 2022-05-20 ENCOUNTER — Inpatient Hospital Stay (HOSPITAL_COMMUNITY): Payer: Medicaid Other

## 2022-05-20 HISTORY — PX: IR REPLC GASTRO/COLONIC TUBE PERCUT W/FLUORO: IMG2333

## 2022-05-20 LAB — GLUCOSE, CAPILLARY
Glucose-Capillary: 105 mg/dL — ABNORMAL HIGH (ref 70–99)
Glucose-Capillary: 110 mg/dL — ABNORMAL HIGH (ref 70–99)
Glucose-Capillary: 117 mg/dL — ABNORMAL HIGH (ref 70–99)
Glucose-Capillary: 126 mg/dL — ABNORMAL HIGH (ref 70–99)
Glucose-Capillary: 96 mg/dL (ref 70–99)
Glucose-Capillary: 97 mg/dL (ref 70–99)

## 2022-05-20 MED ORDER — METHYLPHENIDATE HCL 5 MG PO TABS
25.0000 mg | ORAL_TABLET | Freq: Two times a day (BID) | ORAL | Status: DC
  Administered 2022-05-20 – 2022-06-14 (×51): 25 mg
  Filled 2022-05-20 (×52): qty 5

## 2022-05-20 NOTE — Progress Notes (Signed)
Speech Language Pathology TBI Note  Patient Details  Name: Carl Carpenter MRN: [id number] Date of Birth: December 10, 1986  Today's Date: 05/20/2022 SLP Individual Time: 0930-1030 SLP Individual Time Calculation (min): 60 min  Short Term Goals: Week 2: SLP Short Term Goal 1 (Week 2): Pt will consume therapeutic trials of 1/2 teaspoons of thin liquids and ice chips with minimal overt s/s of aspiration over 3 consecutive sessions and max assist use of swallowing precautions prior to MBS or FEES. SLP Short Term Goal 2 (Week 2): Pt will vocalize on command in 25% of opportunities with max assist multimodal cues. SLP Short Term Goal 3 (Week 2): Pt will answer basic yes/no questions using written choices on a white board and either eye gaze or gesturing in 50% of opportunities with max assist multimodal cues. SLP Short Term Goal 4 (Week 2): Pt will follow 1 step  commands in  50% of opportunities with max assist multimodal cues. SLP Short Term Goal 5 (Week 2): Pt will focus his attention on a targeted stimulus for 60 seconds with max assist multimodal cues.  Skilled Therapeutic Interventions:  Skilled treatment session focused on cognitive goals. Upon arrival, patient was awake while supine in bed. SLP facilitated session by providing total +2 assist for patient to sit EOB. Patient transferred to the wheelchair via a slideboard transfer with +2 assist. Patient repositioned at the sink with focus on initiation of basic grooming tasks in which hand over hand assist was needed for initiation and follow through. SLP utilized the yes/no communication board for use of multimodal communication for answering basic questions regarding wants/needs. With extra time and Max A multimodal cues, patient utilized but unsure of accuracy. SLP brought the patient to a table with focus on initiation of self-feeding (empty spoon). Patient required total hand over hand assist for initiation, follow through and Max A multimodal cues to  open and close his oral cavity. SLP utilized small pieces of ice which eventually fell out of the patient's oral cavity anteriorly. Max A multimodal cues needed for swallow initiation of saliva and for vocalizing on command without success. Patient handed off to OT. Continue with current plan of care.      Pain Intermittent moaning with positioning, maybe at PEG site?; Nursing aware   Agitated Behavior Scale: TBI No unsafe/agitated/restless behaviors noted   Therapy/Group: Individual Therapy  Mayeli Bornhorst 05/20/2022, 12:14 PM

## 2022-05-20 NOTE — Progress Notes (Signed)
Occupational Therapy TBI Note  Patient Details  Name: Carl Carpenter MRN: [id number] Date of Birth: 1986/01/03  Today's Date: 05/20/2022 OT Individual Time: 1025-1150 OT Individual Time Calculation (min): 85 min    Short Term Goals: Week 2:  OT Short Term Goal 1 (Week 2): Pt will maintain arousal for 2 consecutive OT sessions OT Short Term Goal 2 (Week 2): Pt will initiate 25% of commands during ADLs with max cueing OT Short Term Goal 3 (Week 2): Pt will complete 1 simple ADL task with max A OT Short Term Goal 4 (Week 2): Pt will maintain head control EOB with min A  Skilled Therapeutic Interventions/Progress Updates:    Pt passed off from SLP. Session focused on repetition of motor patterns for functional transfers/bed mobility to address trunk/head control, volitional command following, and initiation/sequencing of simple familiar movements. Extra time required to allow pt to motor plan, initiate and attempt to problem solve bimanual use. Max facilitation required for bimanual grasp initiation but then he was able to carryover and hold a small basketball. Max facilitation and frequent repetitions of bringing the ball to the net- pt unable to carryover motor plan but visually fixated on task appropriately. Pt again demonstrating elbow flexion and extension in the RUE. He required max A to terminate grasp- often with a very strong perseverative hold with his LUE. Pt stood x3 trials at an elevated table with facilitation for RUE weightbearing in extension and pt maintaining LUE extension while max +2 assist was provided at his trunk. He fatigued quickly through trials despite prolonged rest break and required more like total A +2 with R pushing behaviors emerging. Pt was transferred via SB, total A +2 to the mat table. He was transferred into supine for closed chain BUE reaching task. Max facilitation for high repetition to encourage carryover of chest press type activity with a 1lb dowel. Both R/L  UE able to maintain grasp with heavy assist required to break elbow extension on the LUE once he fully extended. He required mod facilitation at the RUE to come into extension. He returned to his w/c and then to bed. He required total A +2 for bed level peri hygiene d/t incontinent Bm. Condom cath did come off during session. He was left supine with all needs met, father present.   Therapy Documentation Precautions:  Precautions Precautions: Fall Precaution Comments: capped trach, PEG, binder Restrictions Weight Bearing Restrictions: No  Therapy/Group: Individual Therapy  [name] 05/20/2022, 6:38 AM

## 2022-05-20 NOTE — Progress Notes (Signed)
Physical Therapy Session Note  Patient Details  Name: Carl Carpenter MRN: [id number] Date of Birth: [birth year]  Today's Date: 05/20/2022 PT Individual Time: 1415-1530 PT Individual Time Calculation (min): 75 min   Short Term Goals: Week 2:  PT Short Term Goal 1 (Week 2): Pt will initiate gait training. PT Short Term Goal 2 (Week 2): Pt will sit EOB to participate in therapeutic activity with modA +1. PT Short Term Goal 3 (Week 2): Pt will attend to therapy task for 5 minutes with max cues.  Skilled Therapeutic Interventions/Progress Updates:     Patient in bed with his father at bedside upon PT arrival. Patient alert throughout session without signs of pain.   Patient with increased restlessness with L hemi-body, continues to demonstrate intermittent command following for volitional movement of L hand and foot ~25% of the time. Focused session on NMR with functional mobility and motor activation and control with automatic motor patterns. Progressed gait with [place] walker and 3 musketeers technique x3 with consistent initiation and stepping on L with extensor synergy and mod A for R limb advancement ~50% of the time. Noted continued focused attention to task 3-4 min, 15-20 feet, at a time before fatigue or distraction results in a seated rest break. Patient was incontinent of bowl during gait training and participated in bed level mobility as peri-care and lower body dressing were performed with total A.  Therapeutic Activity: Bed Mobility: Patient performed rolling R/L with mod-max A and scooting up in the bed with dependent assist +4. He peroformed supine to/from sit with mod-max A +2, heavier assist with R hand pushing more today. Provided verbal cues for initiation and sequencing. Transfers: Patient performed a slide board transfer bed>TIS w/c with max-mod A +2 and total A for board placement. Provided cues for hand placement, board placement, and head-hips relationship for proper  technique and decreased assist with transfers. Patient performed a dependent transfer in the [facility] TIS w/c>bed with mod-max A +2 with fair trunk control throughout with assist. Provided verbal cues and facilitation for hand and foot placement and initiation of standing and sitting, needed facilitation to initiate L knee flexion to sit x2 due to extensor tone.  Neuromuscular Re-ed: -sitting EOB focused on reduced controversive pushing with L hand placement and visual targets >3 min with mod-total A for trunk control -sit to stand with 3 musketeer technique x2 and using [place] [employer] executive) walker x1 focused on B quad and gluteal activation with facilitation for forward weight shift and arm/hand placement with max progressing to mod A +2 -therapeutic gait training with [place] walker x1 and 3 musketeer x2 15-20 feet each trial with max A +2 and +3 w/c follow; facilitation provided for trunk control, device management, weight shifting, and min-supervision for L limb advancement with decreased knee flexion and total-mod A for R limb advancement  Patient sitting up in the bed with his fiance and father in the room at end of session with breaks locked, 4 rails up per family request, and all needs within reach.   Therapy Documentation Precautions:  Precautions Precautions: Fall Precaution Comments: capped trach, PEG, binder Restrictions Weight Bearing Restrictions: No    Therapy/Group: Individual Therapy  Rodina Pinales L Marcelina Mclaurin PT, DPT  05/20/2022, 6:03 PM

## 2022-05-20 NOTE — Progress Notes (Signed)
PROGRESS NOTE   Subjective/Complaints: No problems overnight. Dad says he's more fidgety with left hand. Didn't pull on PEG last night  ROS: Limited due to cognitive/behavioral    Objective:   DG ABDOMEN PEG TUBE LOCATION  Result Date: 05/19/2022 CLINICAL DATA:  Gastrostomy tube exchange EXAM: ABDOMEN - 1 VIEW COMPARISON:  Study done earlier today FINDINGS: Contrast injected through the gastrostomy tube is seen in the lumen of stomach. Tip of gastrostomy tube is seen in the region of body of stomach. There is no extravasation of contrast. Bowel gas pattern is nonspecific. Surgical clips are seen in the right lower quadrant. IMPRESSION: Tip of gastrostomy tube is seen in the lumen of stomach. Electronically Signed   By: Ernie Avena M.D.   On: 05/19/2022 15:42   DG ABDOMEN PEG TUBE LOCATION  Result Date: 05/19/2022 CLINICAL DATA:  Feeding tube placement. EXAM: ABDOMEN - 1 VIEW COMPARISON:  03/24/2022 FINDINGS: The feeding tube tip is in the body region of the stomach. No extravasation of contrast material is identified. Moderate stool noted throughout the colon. IMPRESSION: Feeding tube tip is in the body region of the stomach. Electronically Signed   By: Rudie Meyer M.D.   On: 05/19/2022 09:54   Recent Labs    05/18/22 0555  WBC 5.2  HGB 14.1  HCT 41.2  PLT 206    Recent Labs    05/18/22 0555  NA 134*  K 4.0  CL 100  CO2 25  GLUCOSE 103*  BUN 17  CREATININE 0.79  CALCIUM 9.8     Intake/Output Summary (Last 24 hours) at 05/20/2022 1194 Last data filed at 05/19/2022 1215 Gross per 24 hour  Intake 0 ml  Output --  Net 0 ml         Physical Exam: Vital Signs Blood pressure 114/75, pulse 77, temperature (!) 97.5 F (36.4 C), temperature source Oral, resp. rate 18, height 6\' 5"  (1.956 m), weight 102.5 kg, SpO2 96 %.  Constitutional: No distress . Vital signs reviewed. HEENT: NCAT, EOMI, oral membranes  moist Neck: supple Cardiovascular: RRR without murmur. No JVD    Respiratory/Chest: CTA Bilaterally without wheezes or rales. Normal effort    GI/Abdomen: BS +, non-tender, non-distended Ext: no clubbing, cyanosis, or edema Psych: flat  Musculoskeletal:        no pain with ROM  Skin:    Comments: Abrasion left knee present, scalp, bilateral heels have foam dressing.  Sacral dressing present. Neurological:     Comments: improving eye contact. Responds to name. I tried to stimulate him to speak and appears to be making attempts as his tongue seemed to fasciculate in his mouth when I did so. DTR's brisk on right.   Possible mild flexor tone in right hand. Engaging with RUE and RLE inconsistently now.   Assessment/Plan: 1. Functional deficits which require 3+ hours per day of interdisciplinary therapy in a comprehensive inpatient rehab setting. Physiatrist is providing close team supervision and 24 hour management of active medical problems listed below. Physiatrist and rehab team continue to assess barriers to discharge/monitor patient progress toward functional and medical goals  Care Tool:  Bathing    Body parts bathed  by patient: Right arm, Left arm, Chest, Abdomen, Front perineal area, Buttocks, Right upper leg, Left upper leg, Right lower leg, Left lower leg   Body parts bathed by helper: Right arm, Right lower leg, Left lower leg, Chest, Left arm, Front perineal area, Buttocks, Right upper leg, Left upper leg, Abdomen, Face     Bathing assist Assist Level: 2 Helpers     Upper Body Dressing/Undressing Upper body dressing   What is the patient wearing?: Pull over shirt    Upper body assist Assist Level: 2 Helpers    Lower Body Dressing/Undressing Lower body dressing      What is the patient wearing?: Pants, Incontinence brief     Lower body assist Assist for lower body dressing: 2 Helpers     Toileting Toileting    Toileting assist Assist for toileting: 2 Helpers      Transfers Chair/bed transfer  Transfers assist  Chair/bed transfer activity did not occur: Safety/medical concerns  Chair/bed transfer assist level: 2 Helpers     Locomotion Ambulation   Ambulation assist   Ambulation activity did not occur: Safety/medical concerns          Walk 10 feet activity   Assist  Walk 10 feet activity did not occur: Safety/medical concerns        Walk 50 feet activity   Assist Walk 50 feet with 2 turns activity did not occur: Safety/medical concerns         Walk 150 feet activity   Assist Walk 150 feet activity did not occur: Safety/medical concerns         Walk 10 feet on uneven surface  activity   Assist Walk 10 feet on uneven surfaces activity did not occur: Safety/medical concerns         Wheelchair     Assist Is the patient using a wheelchair?: Yes Type of Wheelchair: Manual    Wheelchair assist level: Dependent - Patient 0%      Wheelchair 50 feet with 2 turns activity    Assist        Assist Level: Dependent - Patient 0%   Wheelchair 150 feet activity     Assist      Assist Level: Dependent - Patient 0%   Blood pressure 114/75, pulse 77, temperature (!) 97.5 F (36.4 C), temperature source Oral, resp. rate 18, height 6\' 5"  (1.956 m), weight 102.5 kg, SpO2 96 %.    Medical Problem List and Plan: 1. 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  DVT right posterior tibial and peroneal veins (03/22/22): -anticoagulation:  Pharmaceutical: Other (comment) Eliquis             -antiplatelet therapy: none 3. Pain: continue Tylenol prn 4. Mood: LCSW to evaluate and  provide emotional support             -antipsychotic agents: none 5. Impaired attention: This patient is not capable of making decisions on his own behalf.             -  continue amantadine 200mg  bid             -increase ritalin to 25mg  bid               6. Dysphagia. S/p PEG 3/29.  --NPO -continue TF -bumper is very mobile, adjust as needed -completing diflucan for redness around peg and mild thrush  Continue zinc oxide to protect skin surrounding PEG tube -5/30 replaced PEG under fluoro. Appreciate IR help -continue abd binder for protection 7. Fluids/Electrolytes/Nutrition: Routine Is and Os and follow-up chemistries             --continue tube feeds Jevity 1.5 at 80 mL/hr             --continue Prosource             --continue regular oral care  - prealbumin looks great 35.8 Filed Weights   05/13/22 0840 05/19/22 1238 05/20/22 0500  Weight: 107.3 kg 100.7 kg 102.5 kg    9: C1 right transverse process fracture--he's out of collar 10: Left Occipital condyle fracture--out of collar 11: Acute ventilator respiratory failure requiring trach and PEG 3/29              --decannulated , stoma closed             --continue Robitussin 25 mL q 4 hours  -continue robinul via PEG  - stoma closed  - cough better, hes's afebrile  12: Right ear laceration:  healing 14: Tongue laceration: monitor healing 15: Hypertension: continue Lopressor 50 mg BID             -well controlled 5/26 5/31 bp's reasonable    05/20/2022    5:00 AM 05/20/2022    4:39 AM 05/19/2022    8:21 PM  Vitals with BMI  Weight 226 lbs    BMI 26.79    Systolic  114 125  Diastolic  75 87  Pulse  77 101    16. Hyperglycemia secondary to tube feeds: continue to monitor    18. Iron deficiency:  daily iron supplement added.  19. Tremor,?clonus: continue trial of propranolol 10mg  tid  5/29 Continue propranolol 10 mg tid  LOS: 12 days A FACE TO FACE EVALUATION WAS PERFORMED  05/21/2022 05/20/2022, 8:28 AM

## 2022-05-20 NOTE — Plan of Care (Signed)
  Problem: Consults Goal: RH BRAIN INJURY PATIENT EDUCATION Description: Description: See Patient Education module for eduction specifics Outcome: Progressing Goal: Skin Care Protocol Initiated - if Braden Score 18 or less Description: If consults are not indicated, leave blank or document N/A Outcome: Progressing Goal: Nutrition Consult-if indicated Outcome: Progressing   Problem: RH BOWEL ELIMINATION Goal: RH STG MANAGE BOWEL WITH ASSISTANCE Description: STG Manage Bowel with Mod Assistance. Outcome: Progressing Goal: RH STG MANAGE BOWEL W/MEDICATION W/ASSISTANCE Description: STG Manage Bowel with Medication with Mod Assistance. Outcome: Progressing   Problem: RH BLADDER ELIMINATION Goal: RH STG MANAGE BLADDER WITH ASSISTANCE Description: STG Manage Bladder With Mod Assistance Outcome: Progressing Goal: RH STG MANAGE BLADDER WITH MEDICATION WITH ASSISTANCE Description: STG Manage Bladder With Medication With Mod Assistance. Outcome: Progressing   Problem: RH SKIN INTEGRITY Goal: RH STG MAINTAIN SKIN INTEGRITY WITH ASSISTANCE Description: STG Maintain Skin Integrity With Mod Assistance. Outcome: Progressing Goal: RH STG ABLE TO PERFORM INCISION/WOUND CARE W/ASSISTANCE Description: STG Able To Perform Incision/Wound Care With Mod Assistance. Outcome: Progressing   Problem: RH SAFETY Goal: RH STG ADHERE TO SAFETY PRECAUTIONS W/ASSISTANCE/DEVICE Description: STG Adhere to Safety Precautions With Cues and Reminders. Outcome: Progressing Goal: RH STG DECREASED RISK OF FALL WITH ASSISTANCE Description: STG Decreased Risk of Fall With Mod Assistance. Outcome: Progressing   Problem: RH COGNITION-NURSING Goal: RH STG USES MEMORY AIDS/STRATEGIES W/ASSIST TO PROBLEM SOLVE Description: STG Uses Memory Aids/Strategies With Mod Assistance to Problem Solve. Outcome: Progressing Goal: RH STG ANTICIPATES NEEDS/CALLS FOR ASSIST W/ASSIST/CUES Description: STG Anticipates Needs/Calls for  Assist With Cues and Reminers. Outcome: Progressing   Problem: RH PAIN MANAGEMENT Goal: RH STG PAIN MANAGED AT OR BELOW PT'S PAIN GOAL Description: < 3 on a 0-10 pain scale. Outcome: Progressing   Problem: RH KNOWLEDGE DEFICIT BRAIN INJURY Goal: RH STG INCREASE KNOWLEDGE OF SELF CARE AFTER BRAIN INJURY Description: Patient and family will demonstrate knowledge of self-care management, medication/pain management, skin/wound care with educational materials and handouts provided by staff with mod assist at discharge. Outcome: Progressing Goal: RH STG INCREASE KNOWLEDGE OF DYSPHAGIA/FLUID INTAKE Description: Patient and family will demonstrate knowledge of dysphagia diets, thickened liquids, safe swallowing techniques with educational materials and handouts provided by staff with mod assist at discharge. Outcome: Progressing   

## 2022-05-21 DIAGNOSIS — S069X4D Unspecified intracranial injury with loss of consciousness of 6 hours to 24 hours, subsequent encounter: Secondary | ICD-10-CM

## 2022-05-21 LAB — GLUCOSE, CAPILLARY
Glucose-Capillary: 106 mg/dL — ABNORMAL HIGH (ref 70–99)
Glucose-Capillary: 111 mg/dL — ABNORMAL HIGH (ref 70–99)
Glucose-Capillary: 118 mg/dL — ABNORMAL HIGH (ref 70–99)
Glucose-Capillary: 152 mg/dL — ABNORMAL HIGH (ref 70–99)
Glucose-Capillary: 162 mg/dL — ABNORMAL HIGH (ref 70–99)
Glucose-Capillary: 98 mg/dL (ref 70–99)

## 2022-05-21 NOTE — Progress Notes (Signed)
Physical Therapy Weekly Progress Note  Patient Details  Name: Carl Carpenter MRN: [id number] Date of Birth: 11/02/1986  Beginning of progress report period: May 15, 2022 End of progress report period: May 21, 2022  Today's Date: 05/21/2022 PT Individual Time: 2482-5003 PT Individual Time Calculation (min): 75 min   Patient has met 1 of 3 short term goals.  Patient with slow, steady progress this week. Continues to make gains with purposeful use of L hemi-body following max cues with delayed initiation >25% of the time. Patient demonstrating increased automatic motor control with familiar activities, most clearly with initiation of gait training 15-20 feet consistently this week. He currently requires mod-max A +2 for all mobility with improved lower extremity and trunk activation with transfers. Notable increase in pushing to the L in sitting and to the R in standing demonstrating poor awareness of where his body is in space.   Patient continues to demonstrate the following deficits decreased cardiorespiratoy endurance, impaired timing and sequencing, abnormal tone, unbalanced muscle activation, motor apraxia, decreased coordination, and decreased motor planning, decreased visual motor skills, decreased midline orientation and decreased attention to right, decreased initiation, decreased attention, decreased awareness, decreased problem solving, decreased safety awareness, decreased memory, delayed processing, and demonstrates behaviors consistent with [place]o Level III, and decreased sitting balance, decreased standing balance, decreased postural control, hemiplegia, decreased balance strategies, and difficulty maintaining precautions and therefore will continue to benefit from skilled PT intervention to increase functional independence with mobility.  Patient progressing toward long term goals..  Continue plan of care.  PT Short Term Goals Week 2:  PT Short Term Goal 1 (Week 2): Pt will initiate  gait training. PT Short Term Goal 1 - Progress (Week 2): Met PT Short Term Goal 2 (Week 2): Pt will sit EOB to participate in therapeutic activity with modA +1. PT Short Term Goal 2 - Progress (Week 2): Progressing toward goal PT Short Term Goal 3 (Week 2): Pt will attend to therapy task for 5 minutes with max cues. PT Short Term Goal 3 - Progress (Week 2): Progressing toward goal Week 3:  PT Short Term Goal 1 (Week 3): Pt will attend to therapy task for 5 minutes with max cues. PT Short Term Goal 2 (Week 3): Pt will sit EOB to participate in therapeutic activity with modA +1. PT Short Term Goal 3 (Week 3): Patient with progress therapeutic gait >50 ft with max A +2 for increased activity tolerance and motor planning  Skilled Therapeutic Interventions/Progress Updates:     Patient with reduced restlessness of L hemi-body and slower initiation in the morning compared to afternoon sessions, continues to demonstrate intermittent command following for volitional movement of L hand and foot ~25% of the time. Focused session on NMR with functional mobility and motor activation and control with automatic motor patterns. Progressed gait with 3 musketeers technique x3 with consistent initiation and stepping on L with extensor synergy and mod A for R limb advancement >50% of the time. Noted continued focused attention to task 3-4 min, 20-30 feet, at a time before fatigue with patient stopping to initiate stepping to indicate need for a seated rest break. Progressed transfers to [facility] throughout session with max-mod A +2.   Therapeutic Activity: Bed Mobility: Patient performed rolling R/L with max A +2 to don shorts. Donned non-skid socks with total A bed level. He peroformed supine to sit with max A +2, heavier assist with slower initiation of mobility. Provided verbal cues for initiation and sequencing. Transfers:  Patient performed a dependent transfer in the [facility] TIS w/c>bed with mod-max A +2 with fair  trunk control throughout with assist. Provided verbal cues and facilitation for hand and foot placement and initiation of standing and sitting, did not need assist to break L extensor tone to sit today.   Neuromuscular Re-ed: -sitting EOB focused on sitting balance, patient with increased trunk and L lower extremity extension leading to patient's hips sliding forward after 2 min, required total A +2 to scoot back for safety -sit to stand with 3 musketeer technique x3 focused on B quad and gluteal activation with facilitation for forward weight shift and arm/hand placement with max progressing to mod A +2 -sit to stand in the [place] x2 focused on lower extremity motor control, as above, and progressed to standing balance with as good as min-mod of 1 person with a second person SBA 60-90 sec each trial -therapeutic gait training with 3 musketeer x3 33 feet, 30 feet, and 22 feet with max A +2 and +3 w/c follow; facilitation provided for trunk control, device management, weight shifting, and B limb advancement with leg straps with patient initiation ~50% of the time L>R -grasp release with various objects with his L hand, able to release best with cues to "relax" his hand -arm and thumb wrestling activities x2 with patient initiating appropriately as rehab tech set up for and stated the activity -cues to use "thumbs up" for yes with ~50% accuracy and initiation with increased time for processing   Patient sitting in the TIS w/c with his fiance  in the room at end of session with breaks locked. Donned slippers with total A for additional foot protection while feet were on the leg rests.  Therapy Documentation Precautions:  Precautions Precautions: Fall Precaution Comments: capped trach, PEG, binder Restrictions Weight Bearing Restrictions: No   Therapy/Group: Individual Therapy  [name] [name] PT, DPT  05/21/2022, 5:41 PM

## 2022-05-21 NOTE — Progress Notes (Signed)
PROGRESS NOTE   Subjective/Complaints: Slept pretty well last night. Engaging more with family/staff. Did not try to interfere with PEG last night  ROS: Limited due to cognitive/behavioral    Objective:   IR Replc Gastro/Colonic Tube Percut W/Fluoro  Result Date: 05/20/2022 INDICATION: Patient history of traumatic brain injury status post surgically placed G-tube 03/18/2022, patient pulled G-tube - Foley in tract. Request for replacement of G-tube. EXAM: BEDSIDE REPLACEMENT OF GASTROSTOMY TUBE COMPARISON:  None Available. MEDICATIONS: None. CONTRAST:  None FLUOROSCOPY TIME:  None COMPLICATIONS: None immediate. PROCEDURE: The upper abdomen and external portion of the existing gastrostomy tube was prepped and draped in the usual sterile fashion, and a sterile drape was applied covering the operative field. The balloon of the existing Foley within the tract was deflated in fully removed easily. A 22 French balloon retention G-tube was placed within the existing tract, retention balloon filled with 9 mL of normal saline and pulled against the gastric lumen. Tube flush easily with 10 cc normal saline. Post procedure KUB confirms position gastrostomy tube to be within the lumen of the stomach. IMPRESSION: Successful bedside replacement of a new 22-French gastrostomy tube. The gastrostomy tube is ready for immediate use. Performed by Anders GrantJennifer Omohundro, NP Electronically Signed   By: Marliss Cootsylan  Suttle M.D.   On: 05/20/2022 08:51   DG ABDOMEN PEG TUBE LOCATION  Result Date: 05/19/2022 CLINICAL DATA:  Gastrostomy tube exchange EXAM: ABDOMEN - 1 VIEW COMPARISON:  Study done earlier today FINDINGS: Contrast injected through the gastrostomy tube is seen in the lumen of stomach. Tip of gastrostomy tube is seen in the region of body of stomach. There is no extravasation of contrast. Bowel gas pattern is nonspecific. Surgical clips are seen in the right lower  quadrant. IMPRESSION: Tip of gastrostomy tube is seen in the lumen of stomach. Electronically Signed   By: Ernie AvenaPalani  Rathinasamy M.D.   On: 05/19/2022 15:42   No results for input(s): WBC, HGB, HCT, PLT in the last 72 hours.   No results for input(s): NA, K, CL, CO2, GLUCOSE, BUN, CREATININE, CALCIUM in the last 72 hours.    Intake/Output Summary (Last 24 hours) at 05/21/2022 1039 Last data filed at 05/21/2022 0406 Gross per 24 hour  Intake --  Output 600 ml  Net -600 ml         Physical Exam: Vital Signs Blood pressure 122/70, pulse 75, temperature 98.2 F (36.8 C), resp. rate 16, height 6\' 5"  (1.956 m), weight 102.5 kg, SpO2 96 %.  Constitutional: No distress . Vital signs reviewed. HEENT: NCAT, EOMI, oral membranes moist Neck: supple Cardiovascular: RRR without murmur. No JVD    Respiratory/Chest: CTA Bilaterally without wheezes or rales. Normal effort    GI/Abdomen: BS +, non-tender, non-distended Ext: no clubbing, cyanosis, or edema Psych: pleasant and cooperative  Musculoskeletal:        no pain with ROM  Skin:    Comments: left knee abrasion healing Neurological:     Comments: improving eye contact. Responds to name. Seems to be making attempts at initiating speech.. DTR's brisk on right.   Possible mild flexor tone in right hand. Engaging with RUE and RLE inconsistently now. Squeezed  my hand on command.     Assessment/Plan: 1. Functional deficits which require 3+ hours per day of interdisciplinary therapy in a comprehensive inpatient rehab setting. Physiatrist is providing close team supervision and 24 hour management of active medical problems listed below. Physiatrist and rehab team continue to assess barriers to discharge/monitor patient progress toward functional and medical goals  Care Tool:  Bathing    Body parts bathed by patient: Right arm, Left arm, Chest, Abdomen, Front perineal area, Buttocks, Right upper leg, Left upper leg, Right lower leg, Left lower  leg   Body parts bathed by helper: Right arm, Right lower leg, Left lower leg, Chest, Left arm, Front perineal area, Buttocks, Right upper leg, Left upper leg, Abdomen, Face     Bathing assist Assist Level: 2 Helpers     Upper Body Dressing/Undressing Upper body dressing   What is the patient wearing?: Pull over shirt    Upper body assist Assist Level: 2 Helpers    Lower Body Dressing/Undressing Lower body dressing      What is the patient wearing?: Pants, Incontinence brief     Lower body assist Assist for lower body dressing: 2 Helpers     Toileting Toileting    Toileting assist Assist for toileting: 2 Helpers     Transfers Chair/bed transfer  Transfers assist  Chair/bed transfer activity did not occur: Safety/medical concerns  Chair/bed transfer assist level: 2 Helpers     Locomotion Ambulation   Ambulation assist   Ambulation activity did not occur: Safety/medical concerns          Walk 10 feet activity   Assist  Walk 10 feet activity did not occur: Safety/medical concerns        Walk 50 feet activity   Assist Walk 50 feet with 2 turns activity did not occur: Safety/medical concerns         Walk 150 feet activity   Assist Walk 150 feet activity did not occur: Safety/medical concerns         Walk 10 feet on uneven surface  activity   Assist Walk 10 feet on uneven surfaces activity did not occur: Safety/medical concerns         Wheelchair     Assist Is the patient using a wheelchair?: Yes Type of Wheelchair: Manual    Wheelchair assist level: Dependent - Patient 0%      Wheelchair 50 feet with 2 turns activity    Assist        Assist Level: Dependent - Patient 0%   Wheelchair 150 feet activity     Assist      Assist Level: Dependent - Patient 0%   Blood pressure 122/70, pulse 75, temperature 98.2 F (36.8 C), resp. rate 16, height 6\' 5"  (1.956 m), weight 102.5 kg, SpO2 96 %.    Medical  Problem List and Plan: 1. 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  DVT right posterior tibial and peroneal veins (03/22/22): -anticoagulation:  Pharmaceutical:  Other (comment) Eliquis             -antiplatelet therapy: none 3. Pain: continue Tylenol prn 4. Mood: LCSW to evaluate and provide emotional support             -antipsychotic agents: none 5. Impaired attention: This patient is not capable of making decisions on his own behalf.             -continue amantadine 200mg  bid             -increased ritalin to 25mg  bid--seems to be having effect on attention and initiation without causing anxiety/agitation               6. Dysphagia. S/p PEG 3/29.  --NPO -continue TF -bumper is very mobile, adjust as needed -completing diflucan for redness around peg and mild thrush  Continue zinc oxide to protect skin surrounding PEG tube -5/30 replaced PEG under fluoro. Appreciate IR help -PEG sit looks great 7. Fluids/Electrolytes/Nutrition: Routine Is and Os and follow-up chemistries             --continue tube feeds Jevity 1.5 at 80 mL/hr             --continue Prosource             --continue regular oral care  - prealbumin looks great 35.8 Filed Weights   05/19/22 1238 05/20/22 0500 05/20/22 0735  Weight: 100.7 kg 102.5 kg 102.5 kg    9: C1 right transverse process fracture--he's out of collar 10: Left Occipital condyle fracture--out of collar 11: Acute ventilator respiratory failure requiring trach and PEG 3/29              --decannulated , stoma closed             --continue Robitussin 25 mL q 4 hours  -continue robinul via PEG  -improving cough 12: Right  ear laceration:  healing 14: Tongue laceration: monitor healing 15: Hypertension: continue Lopressor 50 mg BID             -well controlled 5/26 6/1 bp's reasonable    05/21/2022    4:06 AM 05/20/2022    7:40 PM 05/20/2022    3:31 PM  Vitals with BMI  Systolic 122 132 05/22/2022  Diastolic 70 96 87  Pulse 75 98 91    16. Hyperglycemia secondary to tube feeds: continue to monitor    18. Iron deficiency:  daily iron supplement added.  19. Tremor,?clonus: continue trial of propranolol 10mg  tid  5/29 Continue propranolol 10 mg tid  LOS: 13 days A FACE TO FACE EVALUATION WAS PERFORMED  295 05/21/2022, 10:39 AM

## 2022-05-21 NOTE — Plan of Care (Signed)
  Problem: Consults Goal: RH BRAIN INJURY PATIENT EDUCATION Description: Description: See Patient Education module for eduction specifics Outcome: Progressing Goal: Skin Care Protocol Initiated - if Braden Score 18 or less Description: If consults are not indicated, leave blank or document N/A Outcome: Progressing Goal: Nutrition Consult-if indicated Outcome: Progressing   Problem: RH BOWEL ELIMINATION Goal: RH STG MANAGE BOWEL WITH ASSISTANCE Description: STG Manage Bowel with Mod Assistance. Outcome: Progressing Goal: RH STG MANAGE BOWEL W/MEDICATION W/ASSISTANCE Description: STG Manage Bowel with Medication with Mod Assistance. Outcome: Progressing   Problem: RH BLADDER ELIMINATION Goal: RH STG MANAGE BLADDER WITH ASSISTANCE Description: STG Manage Bladder With Mod Assistance Outcome: Progressing Goal: RH STG MANAGE BLADDER WITH MEDICATION WITH ASSISTANCE Description: STG Manage Bladder With Medication With Mod Assistance. Outcome: Progressing   Problem: RH SKIN INTEGRITY Goal: RH STG MAINTAIN SKIN INTEGRITY WITH ASSISTANCE Description: STG Maintain Skin Integrity With Mod Assistance. Outcome: Progressing Goal: RH STG ABLE TO PERFORM INCISION/WOUND CARE W/ASSISTANCE Description: STG Able To Perform Incision/Wound Care With Mod Assistance. Outcome: Progressing   Problem: RH SAFETY Goal: RH STG ADHERE TO SAFETY PRECAUTIONS W/ASSISTANCE/DEVICE Description: STG Adhere to Safety Precautions With Cues and Reminders. Outcome: Progressing Goal: RH STG DECREASED RISK OF FALL WITH ASSISTANCE Description: STG Decreased Risk of Fall With Mod Assistance. Outcome: Progressing   Problem: RH COGNITION-NURSING Goal: RH STG USES MEMORY AIDS/STRATEGIES W/ASSIST TO PROBLEM SOLVE Description: STG Uses Memory Aids/Strategies With Mod Assistance to Problem Solve. Outcome: Progressing Goal: RH STG ANTICIPATES NEEDS/CALLS FOR ASSIST W/ASSIST/CUES Description: STG Anticipates Needs/Calls for  Assist With Cues and Reminers. Outcome: Progressing   Problem: RH PAIN MANAGEMENT Goal: RH STG PAIN MANAGED AT OR BELOW PT'S PAIN GOAL Description: < 3 on a 0-10 pain scale. Outcome: Progressing   Problem: RH KNOWLEDGE DEFICIT BRAIN INJURY Goal: RH STG INCREASE KNOWLEDGE OF SELF CARE AFTER BRAIN INJURY Description: Patient and family will demonstrate knowledge of self-care management, medication/pain management, skin/wound care with educational materials and handouts provided by staff with mod assist at discharge. Outcome: Progressing Goal: RH STG INCREASE KNOWLEDGE OF DYSPHAGIA/FLUID INTAKE Description: Patient and family will demonstrate knowledge of dysphagia diets, thickened liquids, safe swallowing techniques with educational materials and handouts provided by staff with mod assist at discharge. Outcome: Progressing   

## 2022-05-21 NOTE — Progress Notes (Signed)
Patient ID: Carl Carpenter, male   DOB: 12-Aug-1986, 36 y.o.   MRN: [id number]  SW received message from pt MCD CM- [name] [phone]  [phone] confirming that pt has been approved for full Medicaid, and she states she has to enter the information into the system so it will show up. SW updated appropriate staff on above.  Cecile Sheerer, MSW, LCSWA Office: [phone] Cell: [phone] Fax: [phone]

## 2022-05-21 NOTE — Progress Notes (Signed)
Nutrition Follow-up  DOCUMENTATION CODES:   Non-severe (moderate) malnutrition in context of acute illness/injury  INTERVENTION:   Continue Tube feed via PEG:  2 containers of Jevity 1.5 - QID 45 mL ProSource TF - QID 50 mL free water before and after each feeding Provides: 3000 kcal, 164 grams protein, and 1440 mL free water (1840 mL total free water).   NUTRITION DIAGNOSIS:   Moderate Malnutrition related to acute illness as evidenced by moderate fat depletion, moderate muscle depletion. - Ongoing  GOAL:   Patient will meet greater than or equal to 90% of their needs - Being met via TF  MONITOR:   TF tolerance, Weight trends  REASON FOR ASSESSMENT:   New TF    ASSESSMENT:   Pt with recent hospitalization 3/21 - 5/19 for severe TBI after [facility].   5/30 - PEG replaced in IR  Pt sleeping at time of visit, spoke with family at bedside.  Family reports that pt has been tolerating bolus feeds with no signs of intolerance, states stools are formed States that he seems to be a little better after having PEG exchanged, decreased sweating and not pulling at it.  Family with no other questions at this time.   Medications reviewed and include: Colace, Ferrous Sulfate, SSI 0-15 units q4h Labs reviewed: 24 hr CBG 105-118  Diet Order:   Diet Order             Diet NPO time specified  Diet effective now                   EDUCATION NEEDS:   Not appropriate for education at this time  Skin:  Skin Assessment: Reviewed RN Assessment  Last BM:  5/31  Height:  Ht Readings from Last 1 Encounters:  05/11/22 6' 5"  (1.956 m)   Weight:  Wt Readings from Last 1 Encounters:  05/20/22 102.5 kg   BMI:  Body mass index is 26.8 kg/m.  Estimated Nutritional Needs:  Kcal:  2800-3000 Protein:  150-170 grams Fluid:  >2.8 L/day   Hermina Barters RD, LDN Clinical Dietitian See [facility] for contact information.

## 2022-05-21 NOTE — Progress Notes (Signed)
Speech Language Pathology TBI Note  Patient Details  Name: Carl Carpenter MRN: [id number] Date of Birth: Sep 28, 1986  Today's Date: 05/21/2022 SLP Individual Time: 1045-1130 SLP Individual Time Calculation (min): 45 min  Short Term Goals: Week 2: SLP Short Term Goal 1 (Week 2): Pt will consume therapeutic trials of 1/2 teaspoons of thin liquids and ice chips with minimal overt s/s of aspiration over 3 consecutive sessions and max assist use of swallowing precautions prior to MBS or FEES. SLP Short Term Goal 2 (Week 2): Pt will vocalize on command in 25% of opportunities with max assist multimodal cues. SLP Short Term Goal 3 (Week 2): Pt will answer basic yes/no questions using written choices on a white board and either eye gaze or gesturing in 50% of opportunities with max assist multimodal cues. SLP Short Term Goal 4 (Week 2): Pt will follow 1 step  commands in  50% of opportunities with max assist multimodal cues. SLP Short Term Goal 5 (Week 2): Pt will focus his attention on a targeted stimulus for 60 seconds with max assist multimodal cues.  Skilled Therapeutic Interventions: Skilled treatment session focused on cognitive goals. Upon arrival, patient was awake while upright in the wheelchair. SLP facilitated session by providing hand over hand assist for oral care via the suction toothbrush. However, with extra time and Mod verbal and visual cues, patient initiated holding the toothbrush. SLP also provided hand over hand assist for self-feeding with a built up spoon with his RUE with small ice chips. Max-Total A multimodal cues were needed for patient to open and close his oral cavity with the ice spilling anteriorly. Patient with one instance of attempting to catch the falling ice chip. No obvious swallow response noted but patient also did not demonstrate any overt s/s of aspiration. SLP attempted to engage patient to vocalize on command. Patient observed taking "big breaths" with extra time and  Mod verbal and visual cues but unable to phonate. However, patient with increased movement of oral musculature that appeared to be related to patient attempting to vocalize/verbalize. Patient observed pulling at his shorts/briefs, suspect due to incontinence. Nursing made aware. Patient left upright in wheelchair with family present. Continue with current plan of care.   Pain No indications of pain     Therapy/Group: Individual Therapy  Euclide Granito 05/21/2022, 12:38 PM

## 2022-05-21 NOTE — Progress Notes (Signed)
Occupational Therapy TBI Note  Patient Details  Name: Carl Carpenter MRN: [id number] Date of Birth: 26-Feb-1986  Today's Date: 05/21/2022 OT Individual Time: 1335-1430 OT Individual Time Calculation (min): 55 min    Short Term Goals: Week 1:  OT Short Term Goal 1 (Week 1): Patient will attend to stimuli presented in central vision for 1 min indicting improved arousal/attention. OT Short Term Goal 1 - Progress (Week 1): Not met OT Short Term Goal 2 (Week 1): Patient will maintain static sitting balance at EOB with Mod A for >5 min in prep for ADLs. OT Short Term Goal 2 - Progress (Week 1): Not met OT Short Term Goal 3 (Week 1): Patient will complete sit to stand transfer with Max A +2 in prep for ADLs. OT Short Term Goal 3 - Progress (Week 1): Not met OT Short Term Goal 4 (Week 1): Patient will follow 1-step verbal commands with 25% accuracy during 3 consecutive treatment sessions in prep for ADLs. OT Short Term Goal 4 - Progress (Week 1): Progressing toward goal  Skilled Therapeutic Interventions/Progress Updates:    1:1. Pt received in bed with ni indication of pain, but minimally restless pulling at shorts throughout session. Pt requires total +2-3 tranfers via SB with +3 mostly managing LUE around OT during transfer to keep from pushing. Pt lies on back of mat and trialed bridging attempts with pt unable to imitate or execute despite extended time. Trialed use of BUE to press ball up to ceiling while in hooklying. With pt able to initiate BUE flexion and extension. Pt completes functional reach task with MAX A for reach seated on EOB with total A to turn head to look in L visual field to place ball in bascket with min-MAX A for digit extension. Exited session with pt seated in bed, exit alarm on and call light in reach   Therapy Documentation Precautions:  Precautions Precautions: Fall Precaution Comments: capped trach, PEG, binder Restrictions Weight Bearing Restrictions:  No  Therapy/Group: Individual Therapy  Tonny Branch 05/21/2022, 2:36 PM

## 2022-05-22 LAB — GLUCOSE, CAPILLARY
Glucose-Capillary: 121 mg/dL — ABNORMAL HIGH (ref 70–99)
Glucose-Capillary: 107 mg/dL — ABNORMAL HIGH (ref 70–99)
Glucose-Capillary: 127 mg/dL — ABNORMAL HIGH (ref 70–99)
Glucose-Capillary: 104 mg/dL — ABNORMAL HIGH (ref 70–99)
Glucose-Capillary: 91 mg/dL (ref 70–99)
Glucose-Capillary: 125 mg/dL — ABNORMAL HIGH (ref 70–99)

## 2022-05-22 NOTE — Progress Notes (Signed)
Occupational Therapy TBI Note  Patient Details  Name: Carl Carpenter MRN: [id number] Date of Birth: 10-20-1986  Today's Date: 05/22/2022 OT Individual Time: 0930-1030 OT Individual Time Calculation (Carl): 60 Carl   Today's Date: 05/22/2022 OT Individual Time: 1300-1330 OT Individual Time Calculation (Carl): 30 Carl   Short Term Goals: Week 2:  OT Short Term Goal 1 (Week 2): Pt will maintain arousal for 2 consecutive OT sessions OT Short Term Goal 2 (Week 2): Pt will initiate 25% of commands during ADLs with max cueing OT Short Term Goal 3 (Week 2): Pt will complete 1 simple ADL task with max A OT Short Term Goal 4 (Week 2): Pt will maintain head control EOB with Carl A Week 3:     Skilled Therapeutic Interventions/Progress Updates:    Sesion 1: Pt received in bed. Overall dependent for dressing with increased time to imitate reaching to R with LUE to roll but pt does not manage LLE for rolling this date requiring MAX A. Pt dependent +2 SB transfer into new w/c. Leg rests adjusted for propper fit. Pt completes standing trial with pushing tendencies with L extremities requiring OT to stand in pts TIS and hold LUE to chest to keep from pushing on tabletop. Pt able to reach with RUE 2/4 trials to grab cones. With LUE pt able ot make choice in field of 2 to correctly grab color cone OT indicates with significantly increased time to motor plan and stack on top of cones in lap. Exited session with pt seated in TIS with handoff to SLP  Session 2: pt received in TIS with family reporting likely had BM. Dependent transfer to EOB with +2. Pt able to use RUE to hold on to bed rail to assist with rolling 2/3 rolling with tactile cues while +2 maintains position and OT cleanses buttocks. Pt nearly falling asleep at end of session. Exited session with pt seated in bed, exit alarm on and call light in reach  Therapy Documentation Precautions:  Precautions Precautions: Fall Precaution Comments: capped trach,  PEG, binder Restrictions Weight Bearing Restrictions: No   Therapy/Group: Individual Therapy  Shon Hale 05/22/2022, 6:55 AM

## 2022-05-22 NOTE — Progress Notes (Signed)
Speech Language Pathology Weekly Progress and Session Note  Patient Details  Name: Alton Bouknight MRN: [id number] Date of Birth: January 25, 1986  Beginning of progress report period: April 15, 2022 End of progress report period: May 22, 2022  Today's Date: 05/22/2022 SLP Individual Time: 1194-1740 SLP Individual Time Calculation (min): 45 min  Short Term Goals: Week 2: SLP Short Term Goal 1 (Week 2): Pt will consume therapeutic trials of 1/2 teaspoons of thin liquids and ice chips with minimal overt s/s of aspiration over 3 consecutive sessions and max assist use of swallowing precautions prior to MBS or FEES. SLP Short Term Goal 1 - Progress (Week 2): Not met SLP Short Term Goal 2 (Week 2): Pt will vocalize on command in 25% of opportunities with max assist multimodal cues. SLP Short Term Goal 2 - Progress (Week 2): Not met SLP Short Term Goal 3 (Week 2): Pt will answer basic yes/no questions using written choices on a white board and either eye gaze or gesturing in 50% of opportunities with max assist multimodal cues. SLP Short Term Goal 3 - Progress (Week 2): Not met SLP Short Term Goal 4 (Week 2): Pt will follow 1 step  commands in  50% of opportunities with max assist multimodal cues. SLP Short Term Goal 4 - Progress (Week 2): Not met SLP Short Term Goal 5 (Week 2): Pt will focus his attention on a targeted stimulus for 60 seconds with max assist multimodal cues. SLP Short Term Goal 5 - Progress (Week 2): Not met    New Short Term Goals: Week 3: SLP Short Term Goal 1 (Week 3): Pt will consume therapeutic trials of 1/2 teaspoons of thin liquids and ice chips with minimal overt s/s of aspiration over 3 consecutive sessions and max assist use of swallowing precautions prior to MBS or FEES. SLP Short Term Goal 2 (Week 3): Pt will vocalize on command in 25% of opportunities with max assist multimodal cues over 2 sessions. SLP Short Term Goal 3 (Week 3): Pt will answer basic yes/no questions  using written choices on a white board and either eye gaze or gesturing in 50% of opportunities with max assist multimodal cues over 2 sessions. SLP Short Term Goal 4 (Week 3): Pt will follow 1 step  commands in  50% of opportunities with max assist multimodal cues over 2 sessions. SLP Short Term Goal 5 (Week 3): Pt will focus his attention on a targeted stimulus for 60 seconds with max assist multimodal cues over 2 sessions.  Weekly Progress Updates: Patient has made minimal gains this week and has not met any STGs this reporting period. Currently, patient remains NPO with Max-Total A multimodal cues needed for patient to open/close his oral cavity around a spoon. Patient has been consuming trials of small ice chips with most trials spilling anteriorly from his oral cavity. Patient also with prolonged AP transit and significant delay in swallow initiation resulting in consistent overt s/s of aspiration. Recommend patient remain NPO. Patient remains nonverbal and requires Max-Total A for use of multimodal communication (gestures, yes/no boards) to express wants/needs. Patient also unable to phonate on command despite Max A multimodal cues. Patient continues to demonstrate focused attention for ~30 seconds to stimulus/task with inconsistent ability to track and follows commands in ~25% of opportunities with Max A multimodal cues. Patient's overall function can fluctuate  throughout the day depending on fatigue. Patient and family education ongoing. Patient would benefit from continued skilled SLP intervention to maximize his cognitive-linguistic and  swallowing function prior to discharge to reduce caregiver burden.     Intensity: Minumum of 1-2 x/day, 30 to 90 minutes Frequency: 3 to 5 out of 7 days Duration/Length of Stay: 4 weeks Treatment/Interventions: Cognitive remediation/compensation;Cueing hierarchy;Dysphagia/aspiration precaution training;Environmental controls;Internal/external  aids;Speech/Language facilitation;Patient/family education;Functional tasks;Multimodal communication approach;Therapeutic Activities   Daily Session  Skilled Therapeutic Interventions:   Skilled treatment session focused on cognitive-communication goals. SLP facilitated session by providing personal pictures of familiar family members with focus on utilizing multimodal communication for yes/no accuracy. Patient focused attention to pictures in 50% of opportunities for ~30 seconds and utilize yes/no boards for proper identifications (is this shannon?) with 50% accuracy. Patient's function was impacted by perseverative grasp. Patient also decreased management of secretions, therefore, SLP provided oral care via the suction toothbrush. No further episodes of anterior spillage of saliva noted. Patient left upright in wheelchair with father present. Continue with current plan of care.     Pain Pain Assessment Pain Scale: Faces Faces Pain Scale: No hurt  Therapy/Group: Individual Therapy  Elizabethann Lackey 05/22/2022, 7:03 AM

## 2022-05-22 NOTE — Progress Notes (Signed)
PROGRESS NOTE   Subjective/Complaints: Dad says Carl Carpenter had an excellent night. Less fidgety. Awoke fairly easily this morning.   ROS: Limited due to cognitive/behavioral   Objective:   No results found. No results for input(s): WBC, HGB, HCT, PLT in the last 72 hours.   No results for input(s): NA, K, CL, CO2, GLUCOSE, BUN, CREATININE, CALCIUM in the last 72 hours.    Intake/Output Summary (Last 24 hours) at 05/22/2022 1052 Last data filed at 05/22/2022 0430 Gross per 24 hour  Intake --  Output 400 ml  Net -400 ml         Physical Exam: Vital Signs Blood pressure 121/75, pulse 74, temperature 98.1 F (36.7 C), temperature source Oral, resp. rate 17, height 6\' 5"  (1.956 m), weight 102.5 kg, SpO2 94 %.  Constitutional: No distress . Vital signs reviewed. HEENT: NCAT, EOMI, oral membranes moist Neck: supple Cardiovascular: RRR without murmur. No JVD    Respiratory/Chest: CTA Bilaterally without wheezes or rales. Normal effort    GI/Abdomen: BS +, non-tender, non-distended, PEG site clean Ext: no clubbing, cyanosis, or edema Psych: flat, makes eye contact  Musculoskeletal:        no pain with ROM  Skin:    Comments: left knee abrasion essentially healed Neurological:     Comments: intermittent eye contact. Performs simple movements with left hand  DTR's brisk on right.   Possible mild flexor tone in right hand. Engaging with RUE and RLE inconsistently.     Assessment/Plan: 1. Functional deficits which require 3+ hours per day of interdisciplinary therapy in a comprehensive inpatient rehab setting. Physiatrist is providing close team supervision and 24 hour management of active medical problems listed below. Physiatrist and rehab team continue to assess barriers to discharge/monitor patient progress toward functional and medical goals  Care Tool:  Bathing    Body parts bathed by patient: Right arm, Left arm,  Chest, Abdomen, Front perineal area, Buttocks, Right upper leg, Left upper leg, Right lower leg, Left lower leg   Body parts bathed by helper: Right arm, Right lower leg, Left lower leg, Chest, Left arm, Front perineal area, Buttocks, Right upper leg, Left upper leg, Abdomen, Face     Bathing assist Assist Level: 2 Helpers     Upper Body Dressing/Undressing Upper body dressing   What is the patient wearing?: Pull over shirt    Upper body assist Assist Level: 2 Helpers    Lower Body Dressing/Undressing Lower body dressing      What is the patient wearing?: Pants, Incontinence brief     Lower body assist Assist for lower body dressing: 2 Helpers     Toileting Toileting    Toileting assist Assist for toileting: 2 Helpers     Transfers Chair/bed transfer  Transfers assist  Chair/bed transfer activity did not occur: Safety/medical concerns  Chair/bed transfer assist level: 2 Helpers     Locomotion Ambulation   Ambulation assist   Ambulation activity did not occur: Safety/medical concerns          Walk 10 feet activity   Assist  Walk 10 feet activity did not occur: Safety/medical concerns        Walk  50 feet activity   Assist Walk 50 feet with 2 turns activity did not occur: Safety/medical concerns         Walk 150 feet activity   Assist Walk 150 feet activity did not occur: Safety/medical concerns         Walk 10 feet on uneven surface  activity   Assist Walk 10 feet on uneven surfaces activity did not occur: Safety/medical concerns         Wheelchair     Assist Is the patient using a wheelchair?: Yes Type of Wheelchair: Manual    Wheelchair assist level: Dependent - Patient 0%      Wheelchair 50 feet with 2 turns activity    Assist        Assist Level: Dependent - Patient 0%   Wheelchair 150 feet activity     Assist      Assist Level: Dependent - Patient 0%   Blood pressure 121/75, pulse 74,  temperature 98.1 F (36.7 C), temperature source Oral, resp. rate 17, height 6\' 5"  (1.956 m), weight 102.5 kg, SpO2 94 %.    Medical Problem List and Plan: 1. 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  DVT right posterior tibial and peroneal veins (03/22/22): -anticoagulation:  Pharmaceutical: Other (comment) Eliquis             -antiplatelet therapy: none 3. Pain: continue Tylenol prn 4. Mood: LCSW to evaluate and provide emotional support             -antipsychotic agents: none 5. Impaired attention: This patient is not capable of making decisions on his own behalf.             -continue amantadine 200mg  bid             -increased ritalin to 25mg  bid 5/31--seems to be having effect on attention and initiation without causing anxiety/agitation               6. Dysphagia. S/p PEG 3/29.  --NPO -continue TF -bumper is very mobile, adjust as needed -completing diflucan for redness around peg and mild thrush  Continue zinc oxide to protect skin surrounding PEG tube -5/30 replaced PEG under fluoro. Appreciate IR help -PEG sit looks clean 7. Fluids/Electrolytes/Nutrition: Routine Is and Os and follow-up chemistries             --continue tube feeds Jevity 1.5 at 80 mL/hr             --continue Prosource             --continue regular oral care  - prealbumin looks great 35.8 Filed Weights   05/19/22 1238 05/20/22 0500 05/20/22 0735  Weight: 100.7 kg 102.5 kg 102.5 kg    9: C1 right transverse process fracture--he's out of collar 10: Left Occipital condyle fracture--out of collar 11: Acute ventilator respiratory failure requiring trach and PEG 3/29               --decannulated ,  stoma closed             --continue Robitussin 25 mL q 4 hours  -continue robinul via PEG to assist with cough    12: Right ear laceration:  healing 14: Tongue laceration: monitor healing 15: Hypertension: continue Lopressor 50 mg BID         6/1 bp's reasonable    05/22/2022    4:02 AM 05/21/2022    7:41 PM 05/21/2022    2:42 PM  Vitals with BMI  Systolic 121 125 161  Diastolic 75 75 88  Pulse 74 72 85    16. Hyperglycemia secondary to tube feeds: continue to monitor    18. Iron deficiency:  daily iron supplement added.  19. Tremor,?clonus: continue trial of propranolol 10mg  tid  propranolol 10 mg tid seems effective  LOS: 14 days A FACE TO FACE EVALUATION WAS PERFORMED  05/22/2022, 10:52 AM

## 2022-05-22 NOTE — Progress Notes (Signed)
Physical Therapy Session Note  Patient Details  Name: Carl Carpenter MRN: [id number] Date of Birth: 29-Apr-1986  Today's Date: 05/22/2022 PT Individual Time: 1535-1630 PT Individual Time Calculation (min): 55 min   Short Term Goals: Week 1:  PT Short Term Goal 1 (Week 1): Pt will tolerate sitting in WC 3 hours between therapy PT Short Term Goal 1 - Progress (Week 1): Met PT Short Term Goal 2 (Week 1): Pt will sit EOB with max assist of 1. PT Short Term Goal 2 - Progress (Week 1): Met PT Short Term Goal 3 (Week 1): Pt will attend to therapy task for ~5 mintues with max cues PT Short Term Goal 3 - Progress (Week 1): Progressing toward goal PT Short Term Goal 4 (Week 1): Pt will remain aroused throughout entire PT treatment session PT Short Term Goal 4 - Progress (Week 1): Progressing toward goal Week 2:  PT Short Term Goal 1 (Week 2): Pt will initiate gait training. PT Short Term Goal 1 - Progress (Week 2): Met PT Short Term Goal 2 (Week 2): Pt will sit EOB to participate in therapeutic activity with modA +1. PT Short Term Goal 2 - Progress (Week 2): Progressing toward goal PT Short Term Goal 3 (Week 2): Pt will attend to therapy task for 5 minutes with max cues. PT Short Term Goal 3 - Progress (Week 2): Progressing toward goal Week 3:  PT Short Term Goal 1 (Week 3): Pt will attend to therapy task for 5 minutes with max cues. PT Short Term Goal 2 (Week 3): Pt will sit EOB to participate in therapeutic activity with modA +1. PT Short Term Goal 3 (Week 3): Patient with progress therapeutic gait >50 ft with max A +2 for increased activity tolerance and motor planning  Skilled Therapeutic Interventions/Progress Updates:   Pt received supine in bed and agreeable to PT. Supine>sit transfer with *** assist and ***cues   Rolling R and L.   Sit<>supine.   [place] transfers.   Sit<>stand from [place]. To adjust WC cushion  Gait in [place].   Pt returned to room and performed ** transfer to bed with  **. Sit>supine completed with ** and left supine in bed with call bell in reach and all needs met.       Therapy Documentation Precautions:  Precautions Precautions: Fall Precaution Comments: capped trach, PEG, binder Restrictions Weight Bearing Restrictions: No General:   Vital Signs: Therapy Vitals Temp: 98.7 F (37.1 C) Pulse Rate: 81 Resp: 15 BP: 132/82 Patient Position (if appropriate): Lying Oxygen Therapy SpO2: 96 % O2 Device: Room Air Pain:   Mobility:   Locomotion :    Trunk/Postural Assessment :    Balance:   Exercises:   Other Treatments:      Therapy/Group: Individual Therapy  Lorie Phenix 05/22/2022, 4:38 PM

## 2022-05-23 ENCOUNTER — Inpatient Hospital Stay (HOSPITAL_COMMUNITY): Payer: Medicaid Other

## 2022-05-23 DIAGNOSIS — E440 Moderate protein-calorie malnutrition: Secondary | ICD-10-CM

## 2022-05-23 DIAGNOSIS — M7989 Other specified soft tissue disorders: Secondary | ICD-10-CM

## 2022-05-23 LAB — GLUCOSE, CAPILLARY
Glucose-Capillary: 104 mg/dL — ABNORMAL HIGH (ref 70–99)
Glucose-Capillary: 152 mg/dL — ABNORMAL HIGH (ref 70–99)
Glucose-Capillary: 69 mg/dL — ABNORMAL LOW (ref 70–99)
Glucose-Capillary: 91 mg/dL (ref 70–99)
Glucose-Capillary: 93 mg/dL (ref 70–99)
Glucose-Capillary: 94 mg/dL (ref 70–99)

## 2022-05-23 NOTE — Progress Notes (Signed)
Right lower extremity venous duplex completed. Refer to "CV Proc" under chart review to view preliminary results.  05/23/2022 4:11 PM Eula Fried., MHA, RVT, RDCS, RDMS

## 2022-05-23 NOTE — Progress Notes (Signed)
Hypoglycemic Event  CBG: 69  Treatment: MD called and ordered to not treat due to POCT CBG monitoring order.  Symptoms: None   Possible Reasons for Event: Unknown  Comments/MD notified:on call MD notified and stated to not treat.  Patient was given order medications and bolus tube feed.    Carl Carpenter L [name]

## 2022-05-23 NOTE — Plan of Care (Signed)
  Problem: Consults Goal: RH BRAIN INJURY PATIENT EDUCATION Description: Description: See Patient Education module for eduction specifics Outcome: Progressing Goal: Skin Care Protocol Initiated - if Braden Score 18 or less Description: If consults are not indicated, leave blank or document N/A Outcome: Progressing Goal: Nutrition Consult-if indicated Outcome: Progressing   Problem: RH BOWEL ELIMINATION Goal: RH STG MANAGE BOWEL WITH ASSISTANCE Description: STG Manage Bowel with Mod Assistance. Outcome: Progressing Goal: RH STG MANAGE BOWEL W/MEDICATION W/ASSISTANCE Description: STG Manage Bowel with Medication with Mod Assistance. Outcome: Progressing   Problem: RH BLADDER ELIMINATION Goal: RH STG MANAGE BLADDER WITH ASSISTANCE Description: STG Manage Bladder With Mod Assistance Outcome: Progressing Goal: RH STG MANAGE BLADDER WITH MEDICATION WITH ASSISTANCE Description: STG Manage Bladder With Medication With Mod Assistance. Outcome: Progressing   Problem: RH SKIN INTEGRITY Goal: RH STG MAINTAIN SKIN INTEGRITY WITH ASSISTANCE Description: STG Maintain Skin Integrity With Mod Assistance. Outcome: Progressing Goal: RH STG ABLE TO PERFORM INCISION/WOUND CARE W/ASSISTANCE Description: STG Able To Perform Incision/Wound Care With Mod Assistance. Outcome: Progressing   Problem: RH SAFETY Goal: RH STG ADHERE TO SAFETY PRECAUTIONS W/ASSISTANCE/DEVICE Description: STG Adhere to Safety Precautions With Cues and Reminders. Outcome: Progressing Goal: RH STG DECREASED RISK OF FALL WITH ASSISTANCE Description: STG Decreased Risk of Fall With Mod Assistance. Outcome: Progressing   Problem: RH COGNITION-NURSING Goal: RH STG USES MEMORY AIDS/STRATEGIES W/ASSIST TO PROBLEM SOLVE Description: STG Uses Memory Aids/Strategies With Mod Assistance to Problem Solve. Outcome: Progressing Goal: RH STG ANTICIPATES NEEDS/CALLS FOR ASSIST W/ASSIST/CUES Description: STG Anticipates Needs/Calls for  Assist With Cues and Reminers. Outcome: Progressing   Problem: RH PAIN MANAGEMENT Goal: RH STG PAIN MANAGED AT OR BELOW PT'S PAIN GOAL Description: < 3 on a 0-10 pain scale. Outcome: Progressing   Problem: RH KNOWLEDGE DEFICIT BRAIN INJURY Goal: RH STG INCREASE KNOWLEDGE OF SELF CARE AFTER BRAIN INJURY Description: Patient and family will demonstrate knowledge of self-care management, medication/pain management, skin/wound care with educational materials and handouts provided by staff with mod assist at discharge. Outcome: Progressing Goal: RH STG INCREASE KNOWLEDGE OF DYSPHAGIA/FLUID INTAKE Description: Patient and family will demonstrate knowledge of dysphagia diets, thickened liquids, safe swallowing techniques with educational materials and handouts provided by staff with mod assist at discharge. Outcome: Progressing   
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
## 2022-05-24 LAB — GLUCOSE, CAPILLARY
Glucose-Capillary: 100 mg/dL — ABNORMAL HIGH (ref 70–99)
Glucose-Capillary: 104 mg/dL — ABNORMAL HIGH (ref 70–99)
Glucose-Capillary: 107 mg/dL — ABNORMAL HIGH (ref 70–99)
Glucose-Capillary: 134 mg/dL — ABNORMAL HIGH (ref 70–99)
Glucose-Capillary: 80 mg/dL (ref 70–99)
Glucose-Capillary: 90 mg/dL (ref 70–99)
Glucose-Capillary: 94 mg/dL (ref 70–99)

## 2022-05-24 MED ORDER — METOPROLOL TARTRATE 25 MG/10 ML ORAL SUSPENSION
62.5000 mg | Freq: Two times a day (BID) | ORAL | Status: DC
  Administered 2022-05-24 – 2022-05-26 (×4): 62.5 mg
  Filled 2022-05-24 (×4): qty 30
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
## 2022-05-24 NOTE — Plan of Care (Signed)
  Problem: Consults Goal: RH BRAIN INJURY PATIENT EDUCATION Description: Description: See Patient Education module for eduction specifics Outcome: Progressing Goal: Skin Care Protocol Initiated - if Braden Score 18 or less Description: If consults are not indicated, leave blank or document N/A Outcome: Progressing Goal: Nutrition Consult-if indicated Outcome: Progressing   Problem: RH BOWEL ELIMINATION Goal: RH STG MANAGE BOWEL WITH ASSISTANCE Description: STG Manage Bowel with Mod Assistance. Outcome: Progressing Goal: RH STG MANAGE BOWEL W/MEDICATION W/ASSISTANCE Description: STG Manage Bowel with Medication with Mod Assistance. Outcome: Progressing   Problem: RH BLADDER ELIMINATION Goal: RH STG MANAGE BLADDER WITH ASSISTANCE Description: STG Manage Bladder With Mod Assistance Outcome: Progressing Goal: RH STG MANAGE BLADDER WITH MEDICATION WITH ASSISTANCE Description: STG Manage Bladder With Medication With Mod Assistance. Outcome: Progressing   Problem: RH SKIN INTEGRITY Goal: RH STG MAINTAIN SKIN INTEGRITY WITH ASSISTANCE Description: STG Maintain Skin Integrity With Mod Assistance. Outcome: Progressing Goal: RH STG ABLE TO PERFORM INCISION/WOUND CARE W/ASSISTANCE Description: STG Able To Perform Incision/Wound Care With Mod Assistance. Outcome: Progressing   Problem: RH SAFETY Goal: RH STG ADHERE TO SAFETY PRECAUTIONS W/ASSISTANCE/DEVICE Description: STG Adhere to Safety Precautions With Cues and Reminders. Outcome: Progressing Goal: RH STG DECREASED RISK OF FALL WITH ASSISTANCE Description: STG Decreased Risk of Fall With Mod Assistance. Outcome: Progressing   Problem: RH COGNITION-NURSING Goal: RH STG USES MEMORY AIDS/STRATEGIES W/ASSIST TO PROBLEM SOLVE Description: STG Uses Memory Aids/Strategies With Mod Assistance to Problem Solve. Outcome: Progressing Goal: RH STG ANTICIPATES NEEDS/CALLS FOR ASSIST W/ASSIST/CUES Description: STG Anticipates Needs/Calls for  Assist With Cues and Reminers. Outcome: Progressing   Problem: RH PAIN MANAGEMENT Goal: RH STG PAIN MANAGED AT OR BELOW PT'S PAIN GOAL Description: < 3 on a 0-10 pain scale. Outcome: Progressing   Problem: RH KNOWLEDGE DEFICIT BRAIN INJURY Goal: RH STG INCREASE KNOWLEDGE OF SELF CARE AFTER BRAIN INJURY Description: Patient and family will demonstrate knowledge of self-care management, medication/pain management, skin/wound care with educational materials and handouts provided by staff with mod assist at discharge. Outcome: Progressing Goal: RH STG INCREASE KNOWLEDGE OF DYSPHAGIA/FLUID INTAKE Description: Patient and family will demonstrate knowledge of dysphagia diets, thickened liquids, safe swallowing techniques with educational materials and handouts provided by staff with mod assist at discharge. Outcome: Progressing
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
## 2022-05-24 NOTE — Progress Notes (Signed)
PROGRESS NOTE   Subjective/Complaints: Blood pressure elevated today, possibly secondary to Ritalin, discussed with mother increasing lopressor dose so he can continue to get benefits of Ritalin Appears uncomfortable getting TF from PEG  ROS: Limited due to cognitive/behavioral   Objective:   VAS [place] LOWER EXTREMITY VENOUS (DVT)  Result Date: 05/24/2022  Lower Venous DVT Study Patient Name:  ULIS KAPS  Date of Exam:   05/23/2022 Medical Rec #: [id number]         Accession #:    [id number] Date of Birth: Jun 15, 1986          Patient Gender: M Patient Age:   36 years Exam Location:  [facility] Procedure:      VAS [place] LOWER EXTREMITY VENOUS (DVT) Referring Phys: Sula Soda --------------------------------------------------------------------------------  Indications: History of right lower extremity DVT with new onset right ankle and foot swelling.  Comparison Study: 03/22/2022 right lower extremity venous duplex- acute DVT right                   PTV and peroneal veins Performing Technologist: Gertie Fey MHA, RDMS, RVT, RDCS  Examination Guidelines: A complete evaluation includes B-mode imaging, spectral Doppler, color Doppler, and power Doppler as needed of all accessible portions of each vessel. Bilateral testing is considered an integral part of a complete examination. Limited examinations for reoccurring indications may be performed as noted. The reflux portion of the exam is performed with the patient in reverse Trendelenburg.  +---------+---------------+---------+-----------+----------+-----------------+ RIGHT    CompressibilityPhasicitySpontaneityPropertiesThrombus Aging    +---------+---------------+---------+-----------+----------+-----------------+ CFV      Full           Yes      Yes                                    +---------+---------------+---------+-----------+----------+-----------------+ SFJ       Full                                                           +---------+---------------+---------+-----------+----------+-----------------+ FV Prox  Full                                                           +---------+---------------+---------+-----------+----------+-----------------+ FV Mid   Full                                                           +---------+---------------+---------+-----------+----------+-----------------+ FV DistalFull                                                           +---------+---------------+---------+-----------+----------+-----------------+  PFV      Full                                                           +---------+---------------+---------+-----------+----------+-----------------+ POP      Full           Yes      Yes                                    +---------+---------------+---------+-----------+----------+-----------------+ PTV      Full                                                           +---------+---------------+---------+-----------+----------+-----------------+ PERO     None                    No                   Age Indeterminate +---------+---------------+---------+-----------+----------+-----------------+   +----+---------------+---------+-----------+----------+--------------+ LEFTCompressibilityPhasicitySpontaneityPropertiesThrombus Aging +----+---------------+---------+-----------+----------+--------------+ CFV Full           Yes      Yes                                 +----+---------------+---------+-----------+----------+--------------+     Summary: RIGHT: - Findings consistent with age indeterminate deep vein thrombosis involving the right peroneal veins. No progression of previously noted peroneal vein thrombus. Resolution of previously noted posterior tibial vein thrombus. - There is no evidence of acute deep vein thrombosis in the lower extremity.  - No cystic  structure found in the popliteal fossa.  LEFT: - No evidence of common femoral vein obstruction.  *See table(s) above for measurements and observations. Electronically signed by Waverly Ferrari MD on 05/24/2022 at 6:31:30 AM.    Final    No results for input(s): WBC, HGB, HCT, PLT in the last 72 hours.   No results for input(s): NA, K, CL, CO2, GLUCOSE, BUN, CREATININE, CALCIUM in the last 72 hours.    Intake/Output Summary (Last 24 hours) at 05/24/2022 1439 Last data filed at 05/24/2022 0600 Gross per 24 hour  Intake 220 ml  Output 1250 ml  Net -1030 ml          Physical Exam: Vital Signs Blood pressure (!) 133/93, pulse 91, temperature 97.8 F (36.6 C), temperature source Oral, resp. rate 16, height  (1.956 m), weight 101.2 kg, SpO2 94 %.  Gen: appears uncomfortable getting TF from PEG HEENT: oral mucosa pink and moist, NCAT Cardio: Reg rate Chest: normal effort, normal rate of breathing  GI/Abdomen: BS +, non-tender, non-distended, PEG site clean Ext: no clubbing, cyanosis, or edema Psych: flat, makes eye contact  Musculoskeletal:        no pain with ROM  Skin:    Comments: left knee abrasion essentially healed Neurological:     Comments: intermittent eye contact. Performs simple movements with left hand  DTR's brisk on right.   Possible mild flexor tone in right hand. Engaging with  RUE and RLE inconsistently.     Assessment/Plan: 1. Functional deficits which require 3+ hours per day of interdisciplinary therapy in a comprehensive inpatient rehab setting. Physiatrist is providing close team supervision and 24 hour management of active medical problems listed below. Physiatrist and rehab team continue to assess barriers to discharge/monitor patient progress toward functional and medical goals  Care Tool:  Bathing    Body parts bathed by patient: Right arm, Left arm, Chest, Abdomen, Front perineal area, Buttocks, Right upper leg, Left upper leg, Right lower leg,  Left lower leg   Body parts bathed by helper: Right arm, Right lower leg, Left lower leg, Chest, Left arm, Front perineal area, Buttocks, Right upper leg, Left upper leg, Abdomen, Face     Bathing assist Assist Level: 2 Helpers     Upper Body Dressing/Undressing Upper body dressing   What is the patient wearing?: Pull over shirt    Upper body assist Assist Level: 2 Helpers    Lower Body Dressing/Undressing Lower body dressing      What is the patient wearing?: Pants, Incontinence brief     Lower body assist Assist for lower body dressing: 2 Helpers     Toileting Toileting    Toileting assist Assist for toileting: 2 Helpers     Transfers Chair/bed transfer  Transfers assist  Chair/bed transfer activity did not occur: Safety/medical concerns  Chair/bed transfer assist level: 2 Helpers     Locomotion Ambulation   Ambulation assist   Ambulation activity did not occur: Safety/medical concerns          Walk 10 feet activity   Assist  Walk 10 feet activity did not occur: Safety/medical concerns        Walk 50 feet activity   Assist Walk 50 feet with 2 turns activity did not occur: Safety/medical concerns         Walk 150 feet activity   Assist Walk 150 feet activity did not occur: Safety/medical concerns         Walk 10 feet on uneven surface  activity   Assist Walk 10 feet on uneven surfaces activity did not occur: Safety/medical concerns         Wheelchair     Assist Is the patient using a wheelchair?: Yes Type of Wheelchair: Manual    Wheelchair assist level: Dependent - Patient 0%      Wheelchair 50 feet with 2 turns activity    Assist        Assist Level: Dependent - Patient 0%   Wheelchair 150 feet activity     Assist      Assist Level: Dependent - Patient 0%   Blood pressure (!) 133/93, pulse 91, temperature 97.8 F (36.6 C), temperature source Oral, resp. rate 16, height 6\' 5"  (1.956 m),  weight 101.2 kg, SpO2 94 %.    Medical Problem List and Plan: 1. 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  DVT right posterior tibial and peroneal veins (03/22/22):  Discussed with mother that repeat doppler shows no new clots and resolution of one previous clot.  -anticoagulation:  Pharmaceutical: Other (comment) Eliquis             -antiplatelet therapy: none 3. Pain: continueTylenol prn 4. Mood: LCSW to evaluate and provide emotional support             -antipsychotic agents: none 5. Impaired attention: This patient is not capable of making decisions on his own behalf.             -continue amantadine 200mg  bid             -increased ritalin to 25mg  bid 5/31--seems to be having effect on attention and initiation without causing anxiety/agitation               6. Dysphagia. S/p PEG 3/29.  --NPO -continue TF -bumper is very mobile, adjust as needed -completing diflucan for redness around peg and mild thrush  Continue zinc oxide to protect skin surrounding PEG tube -5/30 replaced PEG under fluoro. Appreciate IR help -PEG sit looks clean 7. Fluids/Electrolytes/Nutrition: Routine Is and Os and follow-up chemistries             --continue tube feeds Jevity 1.5 at 80 mL/hr             --continue Prosource             --continue regular oral care  - prealbumin looks great 35.8 Filed Weights   05/20/22 0500 05/20/22 0735 05/24/22 0624  Weight: 102.5 kg 102.5 kg 101.2 kg    9: C1 right transverse process fracture--he's out of collar 10: Left Occipital condyle fracture--out of collar 11: Acute ventilator respiratory failure requiring trach and PEG 3/29               --decannulated , stoma closed             --continue Robitussin 25 mL q 4 hours  -continue robinul via PEG to assist with cough    12: Right ear laceration:  healing 14: Tongue laceration: monitor healing 15: Hypertension: increase Lopressor to 62.5 mg BID         05/24/2022    1:01 PM 05/24/2022    9:15 AM 05/24/2022    6:24 AM  Vitals with BMI  Weight   223 lbs  BMI   26.44  Systolic 133 136   Diastolic 93 103   Pulse 91 85     16. Hypoglycemic to 69: otherwise in 90s: will d/c ISS since not requiring   18. Iron deficiency:  continue daily iron supplement  19. Tremor,?clonus: continue trial of propranolol 10mg  tid  propranolol 10 mg tid seems effective 20. Worsening right lower extremity swelling: repeat US ordered to assess for new clots and none are evident  LOS: 16 days A FACE TO FACE EVALUATION WAS PERFORMED  Drema PryKrutika P Aleli Navedo 05/24/2022, 2:39 PM

## 2022-05-25 LAB — GLUCOSE, CAPILLARY
Glucose-Capillary: 102 mg/dL — ABNORMAL HIGH (ref 70–99)
Glucose-Capillary: 104 mg/dL — ABNORMAL HIGH (ref 70–99)
Glucose-Capillary: 106 mg/dL — ABNORMAL HIGH (ref 70–99)
Glucose-Capillary: 98 mg/dL (ref 70–99)
Glucose-Capillary: 99 mg/dL (ref 70–99)
Glucose-Capillary: 99 mg/dL (ref 70–99)

## 2022-05-25 LAB — CBC
HCT: 38.4 % — ABNORMAL LOW (ref 39.0–52.0)
Hemoglobin: 13.7 g/dL (ref 13.0–17.0)
MCH: 31.9 pg (ref 26.0–34.0)
MCHC: 35.7 g/dL (ref 30.0–36.0)
MCV: 89.5 fL (ref 80.0–100.0)
Platelets: 184 10*3/uL (ref 150–400)
RBC: 4.29 MIL/uL (ref 4.22–5.81)
RDW: 12.1 % (ref 11.5–15.5)
WBC: 7.6 10*3/uL (ref 4.0–10.5)
nRBC: 0 % (ref 0.0–0.2)

## 2022-05-25 LAB — BASIC METABOLIC PANEL
Anion gap: 7 (ref 5–15)
BUN: 14 mg/dL (ref 6–20)
CO2: 26 mmol/L (ref 22–32)
Calcium: 9.7 mg/dL (ref 8.9–10.3)
Chloride: 101 mmol/L (ref 98–111)
Creatinine, Ser: 0.83 mg/dL (ref 0.61–1.24)
GFR, Estimated: >60 mL/min (ref >60)
Glucose, Bld: 96 mg/dL (ref 70–99)
Potassium: 3.8 mmol/L (ref 3.5–5.1)
Sodium: 134 mmol/L — ABNORMAL LOW (ref 135–145)

## 2022-05-25 NOTE — Progress Notes (Signed)
Physical Therapy TBI Note  Patient Details  Name: Carl Carpenter MRN: [id number] Date of Birth: 01-Jan-1986  Today's Date: 05/25/2022 PT Individual Time: 5102-5852 PT Individual Time Calculation (min): 74 min   Short Term Goals: Week 3:  PT Short Term Goal 1 (Week 3): Pt will attend to therapy task for 5 minutes with max cues. PT Short Term Goal 2 (Week 3): Pt will sit EOB to participate in therapeutic activity with modA +1. PT Short Term Goal 3 (Week 3): Patient with progress therapeutic gait >50 ft with max A +2 for increased activity tolerance and motor planning  Skilled Therapeutic Interventions/Progress Updates:     Pt received supine in bed receiving brief change with NT and pt's partner present. Pt performs bilateral rolling in bed with maxA +2 and cues for sequencing. No apparent pain. Following, pt performs supine to sit with maxA +2 and cues for logrolling and positioning at EOB. Slideboard transfer to [facility] with totalA +2 and cues for initiation and anterior trunk lean. WC transport to gym for time management. Pt performs activities in standing frame with mirror provided for visual feedback. Initially pt just cued to work on posture and balance. Pt requires frequent cueing and redirection due to tendency to grab and push with L upper extremity. PT stands in chair behind pt (due to pt's height) and provides verbal and tactile cueing for neutral position of pelvis and trunk, as well as upright gaze to improve balance and visual feedback. Following extended seated rest break, pt stands again in frame and tasked with retrieving and manipulating specific colored foam blocks. Pt requires max hand over hand cueing to correctly complete. Following extended seated rest break, pt stands with maxA +2, 3 musketeers technique. Initially pt works on standing balance and posture, then progresses to gait with +3 present for assistance managing bilateral lower extremities, and +4 for WC follow. Pt initially  requires totalA to initiate steps, with heavy facilitation of lateral weight shifting, but toward the end of gait, pt is able to initiate steps without manual assistance. Pt ambulates total of 10'.   Slideboard transfer back to bed with totalA +2. Pt left supine with alarm intact and all needs within reach.  Therapy Documentation Precautions:  Precautions Precautions: Fall Precaution Comments: capped trach, PEG, binder Restrictions Weight Bearing Restrictions: No   Therapy/Group: Individual Therapy  [name], PT, DPT 05/25/2022, 5:46 PM

## 2022-05-25 NOTE — Plan of Care (Signed)
Shifting care plan to reflect focus on task initiation and gross motor mobility.  Problem: RH Dressing Goal: LTG Patient will perform lower body dressing w/assist (OT) Description: LTG: Patient will perform lower body dressing with assist, with/without cues in positioning using equipment (OT) Outcome: Not Applicable   Problem: RH Toileting Goal: LTG Patient will perform toileting task (3/3 steps) with assistance level (OT) Description: LTG: Patient will perform toileting task (3/3 steps) with assistance level (OT)  Outcome: Not Applicable

## 2022-05-25 NOTE — Progress Notes (Signed)
Occupational Therapy Weekly Progress Note  Patient Details  Name: Carl Carpenter MRN: [id number] Date of Birth: 21-Apr-1986  Beginning of progress report period: May 18, 2022 End of progress report period: May 25, 2022  Today's Date: 05/25/2022 OT Individual Time: 5053-9767 OT Individual Time Calculation (min): 65 min    Patient has met 2 of 4 short term goals.  Carl Carpenter continues to make slow and subtle progress. He is still most consistent with a RLAS III with fluctuations/inconsistent in arousal. He has at times initiated simple commands and ADLS with only min cueing but often required max multimodal cueing or can not perform at all. He has made improvements with gross motor function- sitting balance and functional mobility with +2-3 assist for head and trunk control, as well as BLE facilitation. He is able to initiate standing with only mod A now and has good carryover of standing and L stepping. Family education ongoing and will be formally scheduled closer to d /c.   Patient continues to demonstrate the following deficits: muscle weakness, decreased cardiorespiratoy endurance, decreased coordination and decreased motor planning, decreased initiation, decreased attention, decreased awareness, decreased problem solving, decreased safety awareness, decreased memory, delayed processing, and demonstrates behaviors consistent with [place] RLAS III, and decreased sitting balance, decreased standing balance, decreased postural control, hemiplegia, and decreased balance strategies and therefore will continue to benefit from skilled OT intervention to enhance overall performance with BADL and Reduce care partner burden.  Patient not progressing toward long term goals.  See goal revision..  Plan of care revisions: Several goals d/c to reflect shift to focus on task initiation, awareness/arousal, and gross motor mobility.  OT Short Term Goals Week 2:  OT Short Term Goal 1 (Week 2): Pt will maintain  arousal for 2 consecutive OT sessions OT Short Term Goal 1 - Progress (Week 2): Met OT Short Term Goal 2 (Week 2): Pt will initiate 25% of commands during ADLs with max cueing OT Short Term Goal 2 - Progress (Week 2): Progressing toward goal OT Short Term Goal 3 (Week 2): Pt will complete 1 simple ADL task with max A OT Short Term Goal 3 - Progress (Week 2): Progressing toward goal OT Short Term Goal 4 (Week 2): Pt will maintain head control EOB with min A OT Short Term Goal 4 - Progress (Week 2): Met Week 3:  OT Short Term Goal 1 (Week 3): Caregivers will demonstrate competency with UE PROM OT Short Term Goal 2 (Week 3): Pt will initiate 1 step of UB dressing with max cueing OT Short Term Goal 3 (Week 3): Pt will maintain sitting balance statically with max A  Skilled Therapeutic Interventions/Progress Updates:    Pt received supine with no indications of pain, his father present. Pt very alert. Initiated changing of brief and pt with crunch off the bed- lifting his head and shoulders. He was able to assist slightly with rolling R, with max facilitation for hand placement. Pt still with perseverative grasp that often requires very heavy assist to release. New brief donned and primo fit removed with total A. He transferred to EOB with max A. Pt required max A for sitting balance EOB. He stood with mod A of 2 and then once standing had improved upright posture and BLE weightbearing. Still with moderate R push d/t over-reliance on LLE. He completed 10 ft of functional mobility with +3 assist- one to manage BLE initiation/swing through and a person on either side for trunk support. Flexed forward posture worsened  as he fatigued. He was taken to the therapy gym via w/c. He completed a slideboard transfer to the therapy mat with total A +2. Worked on standing balance/trunk control with +2 assist, min a to power up and mod +2 to remain standing for safety. Manual facilitation required for head lifting in  standing. In sitting worked on sitting balance statically with propped sitting forward onto therapist legs to promote BUE weightbearing and trunk control. Pt able to give a thumbs up several times during session (always yes) appropriate for situation. He began sweating more profusely and BP assessed= WFL. He was assisted into supine on the mat. Worked on bridging hips for carryover to LB dressing at bed level to reduce caregiver burden. With max facilitation to initiate pt able to carryover bridge and isometric hold for 5 sec. Tactile cueing for termination. Stand pivot transfer with mod +2 assist to the w/c. Pt returned to his room and left sitting up with his father present. Blanket wrapped around his waist to minimize risk of pt messing with peg. His father present.   Therapy Documentation Precautions:  Precautions Precautions: Fall Precaution Comments: capped trach, PEG, binder Restrictions Weight Bearing Restrictions: No  Therapy/Group: Individual Therapy  [name] 05/25/2022, 9:06 AM

## 2022-05-25 NOTE — Progress Notes (Signed)
Speech Language Pathology TBI Note  Patient Details  Name: Carl Carpenter MRN: [id number] Date of Birth: Dec 19, 1986  Today's Date: 05/25/2022 SLP Individual Time: 1300-1340 SLP Individual Time Calculation (min): 40 min  Short Term Goals: Week 3: SLP Short Term Goal 1 (Week 3): Pt will consume therapeutic trials of 1/2 teaspoons of thin liquids and ice chips with minimal overt s/s of aspiration over 3 consecutive sessions and max assist use of swallowing precautions prior to MBS or FEES. SLP Short Term Goal 2 (Week 3): Pt will vocalize on command in 25% of opportunities with max assist multimodal cues over 2 sessions. SLP Short Term Goal 3 (Week 3): Pt will answer basic yes/no questions using written choices on a white board and either eye gaze or gesturing in 50% of opportunities with max assist multimodal cues over 2 sessions. SLP Short Term Goal 4 (Week 3): Pt will follow 1 step  commands in  50% of opportunities with max assist multimodal cues over 2 sessions. SLP Short Term Goal 5 (Week 3): Pt will focus his attention on a targeted stimulus for 60 seconds with max assist multimodal cues over 2 sessions.  Skilled Therapeutic Interventions: Skilled treatment session focused on dysphagia and cognitive goals. Upon arrival, patient had recently been transferred back to bed due to an episode of incontinence. Patient remain lethargic throughout session despite Max A multimodal cues from clinician and patient's father. SLP utilized wet washcloths, oral care, etc to maximize arousal and alertness with minimal success. SLP utilized thermal stimulation in hopes of maximizing arousal and alertness. Patient required Max A multimodal cues to open his oral cavity with swallow initiation present in 25% of trials. Overt s/s of aspiration noted due to decreased management of saliva. Recommend patient remain NPO. Despite constant stimulation, patient never fully awakened. Patient left upright in bed with alarm on  and family present. Continue with current plan of care.      Pain No indications of pain  Agitated Behavior Scale: TBI  No unsafe behaviors noted  Therapy/Group: Individual Therapy  [name] 05/25/2022, 2:24 PM

## 2022-05-25 NOTE — Progress Notes (Signed)
PROGRESS NOTE   Subjective/Complaints: Father in room. Says he had a good night. Seems to have tried to assist in removing clothing.   ROS: Limited due to cognitive/behavioral   Objective:   VAS [place] LOWER EXTREMITY VENOUS (DVT)  Result Date: 05/24/2022  Lower Venous DVT Study Patient Name:  Carl Carpenter  Date of Exam:   05/23/2022 Medical Rec #: [id number]         Accession #:    [id number] Date of Birth: [birth year]          Patient Gender: M Patient Age:   36 years Exam Location:  [facility] Procedure:      VAS [place] LOWER EXTREMITY VENOUS (DVT) Referring Phys: Sula Soda --------------------------------------------------------------------------------  Indications: History of right lower extremity DVT with new onset right ankle and foot swelling.  Comparison Study: 03/22/2022 right lower extremity venous duplex- acute DVT right                   PTV and peroneal veins Performing Technologist: Gertie Fey MHA, RDMS, RVT, RDCS  Examination Guidelines: A complete evaluation includes B-mode imaging, spectral Doppler, color Doppler, and power Doppler as needed of all accessible portions of each vessel. Bilateral testing is considered an integral part of a complete examination. Limited examinations for reoccurring indications may be performed as noted. The reflux portion of the exam is performed with the patient in reverse Trendelenburg.  +---------+---------------+---------+-----------+----------+-----------------+ RIGHT    CompressibilityPhasicitySpontaneityPropertiesThrombus Aging    +---------+---------------+---------+-----------+----------+-----------------+ CFV      Full           Yes      Yes                                    +---------+---------------+---------+-----------+----------+-----------------+ SFJ      Full                                                            +---------+---------------+---------+-----------+----------+-----------------+ FV Prox  Full                                                           +---------+---------------+---------+-----------+----------+-----------------+ FV Mid   Full                                                           +---------+---------------+---------+-----------+----------+-----------------+ FV DistalFull                                                           +---------+---------------+---------+-----------+----------+-----------------+  PFV      Full                                                           +---------+---------------+---------+-----------+----------+-----------------+ POP      Full           Yes      Yes                                    +---------+---------------+---------+-----------+----------+-----------------+ PTV      Full                                                           +---------+---------------+---------+-----------+----------+-----------------+ PERO     None                    No                   Age Indeterminate +---------+---------------+---------+-----------+----------+-----------------+   +----+---------------+---------+-----------+----------+--------------+ LEFTCompressibilityPhasicitySpontaneityPropertiesThrombus Aging +----+---------------+---------+-----------+----------+--------------+ CFV Full           Yes      Yes                                 +----+---------------+---------+-----------+----------+--------------+     Summary: RIGHT: - Findings consistent with age indeterminate deep vein thrombosis involving the right peroneal veins. No progression of previously noted peroneal vein thrombus. Resolution of previously noted posterior tibial vein thrombus. - There is no evidence of acute deep vein thrombosis in the lower extremity.  - No cystic structure found in the popliteal fossa.  LEFT: - No evidence of common  femoral vein obstruction.  *See table(s) above for measurements and observations. Electronically signed by Waverly Ferrari MD on 05/24/2022 at 6:31:30 AM.    Final    Recent Labs    05/25/22 0629  WBC 7.6  HGB 13.7  HCT 38.4*  PLT 184     Recent Labs    05/25/22 0629  NA 134*  K 3.8  CL 101  CO2 26  GLUCOSE 96  BUN 14  CREATININE 0.83  CALCIUM 9.7      Intake/Output Summary (Last 24 hours) at 05/25/2022 1126 Last data filed at 05/25/2022 0414 Gross per 24 hour  Intake 350 ml  Output 1100 ml  Net -750 ml          Physical Exam: Vital Signs Blood pressure 117/88, pulse 78, temperature 97.6 F (36.4 C), temperature source Oral, resp. rate 18, height  (1.956 m), weight 103.9 kg, SpO2 93 %.  Constitutional: No distress . Vital signs reviewed. HEENT: NCAT, EOMI, oral membranes moist Neck: supple Cardiovascular: RRR without murmur. No JVD    Respiratory/Chest: CTA Bilaterally without wheezes or rales. Normal effort    GI/Abdomen: BS +, non-tender, non-distended, PEG clean Ext: no clubbing, cyanosis, or edema Psych: flat, makes eye contact  Musculoskeletal:        no pain with ROM  Skin:    Comments: left knee abrasion  with scar Neurological:     Comments: intermittent eye contact. Performs simple movements with left hand  DTR's brisk on right.   Possible mild flexor tone in right hand. Engaging with RUE and RLE inconsistently.  Tends to grab on hard with left hand and not disengage.    Assessment/Plan: 1. Functional deficits which require 3+ hours per day of interdisciplinary therapy in a comprehensive inpatient rehab setting. Physiatrist is providing close team supervision and 24 hour management of active medical problems listed below. Physiatrist and rehab team continue to assess barriers to discharge/monitor patient progress toward functional and medical goals  Care Tool:  Bathing    Body parts bathed by patient: Right arm, Left arm, Chest, Abdomen,  Front perineal area, Buttocks, Right upper leg, Left upper leg, Right lower leg, Left lower leg   Body parts bathed by helper: Right arm, Right lower leg, Left lower leg, Chest, Left arm, Front perineal area, Buttocks, Right upper leg, Left upper leg, Abdomen, Face     Bathing assist Assist Level: 2 Helpers     Upper Body Dressing/Undressing Upper body dressing   What is the patient wearing?: Pull over shirt    Upper body assist Assist Level: 2 Helpers    Lower Body Dressing/Undressing Lower body dressing      What is the patient wearing?: Pants, Incontinence brief     Lower body assist Assist for lower body dressing: 2 Helpers     Toileting Toileting    Toileting assist Assist for toileting: 2 Helpers     Transfers Chair/bed transfer  Transfers assist  Chair/bed transfer activity did not occur: Safety/medical concerns  Chair/bed transfer assist level: 2 Helpers     Locomotion Ambulation   Ambulation assist   Ambulation activity did not occur: Safety/medical concerns          Walk 10 feet activity   Assist  Walk 10 feet activity did not occur: Safety/medical concerns        Walk 50 feet activity   Assist Walk 50 feet with 2 turns activity did not occur: Safety/medical concerns         Walk 150 feet activity   Assist Walk 150 feet activity did not occur: Safety/medical concerns         Walk 10 feet on uneven surface  activity   Assist Walk 10 feet on uneven surfaces activity did not occur: Safety/medical concerns         Wheelchair     Assist Is the patient using a wheelchair?: Yes Type of Wheelchair: Manual    Wheelchair assist level: Dependent - Patient 0%      Wheelchair 50 feet with 2 turns activity    Assist        Assist Level: Dependent - Patient 0%   Wheelchair 150 feet activity     Assist      Assist Level: Dependent - Patient 0%   Blood pressure 117/88, pulse 78, temperature 97.6 F  (36.4 C), temperature source Oral, resp. rate 18, height 6\' 5"  (1.956 m), weight 103.9 kg, SpO2 93 %.    Medical Problem List and Plan: 1. 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  DVT right posterior tibial and peroneal veins (03/22/22): Discussed with mother that repeat doppler shows no new clots and resolution of one previous clot.  -anticoagulation: continue Eliquis             -antiplatelet therapy: none 3. Pain: continueTylenol prn 4. Mood: LCSW to evaluate and provide emotional support             -antipsychotic agents: none 5. Impaired attention: This patient is not capable of making decisions on his own behalf.             -continue amantadine 200mg  bid             -increased ritalin to 25mg  bid 5/31--seems to be having effect on attention and initiation without causing anxiety/agitation               6. Dysphagia. S/p PEG 3/29.  --NPO -continue TF -Continue zinc oxide to protect skin surrounding PEG tube -5/30 replaced PEG under fluoro. Appreciate IR help -PEG sit looks clean currently 7. Fluids/Electrolytes/Nutrition: Routine Is and Os and follow-up chemistries             --continue tube feeds Jevity 1.5 at 80 mL/hr             --continue Prosource             --continue regular oral care  - prealbumin looks great 35.8, gaining weight Filed Weights   05/20/22 0735 05/24/22 0624 05/25/22 0409  Weight: 102.5 kg 101.2 kg 103.9 kg    9: C1 right transverse process fracture--he's out of collar 10: Left Occipital condyle fracture--out of collar 11: Acute ventilator respiratory failure requiring trach and PEG 3/29              --decannulated , stoma  closed             --continue Robitussin 25 mL q 4 hours  -continue robinul via PEG to assist with cough    12: Right ear laceration:  healing 14: Tongue laceration: monitor healing 15: Hypertension: increased Lopressor to 62.5 mg BID      -6/5 may be able to reduce lopressor again as bp's are sl soft  -observe today    05/25/2022    4:09 AM 05/24/2022    7:52 PM 05/24/2022    1:01 PM  Vitals with BMI  Weight 229 lbs    BMI 27.15    Systolic 117 114 07/24/2022  Diastolic 88 80 93  Pulse 78 80 91    16. Hypoglycemic to 69: otherwise in 90s: will d/c ISS since not requiring   18. Iron deficiency:  continue daily iron supplement  19. Tremor,?clonus: continue trial of propranolol 10mg  tid  propranolol 10 mg tid seems effective   -may transition off soon depending on bp/hr  LOS: 17 days A FACE TO FACE EVALUATION WAS PERFORMED  07/24/2022 05/25/2022, 11:26 AM

## 2022-05-26 LAB — GLUCOSE, CAPILLARY
Glucose-Capillary: 99 mg/dL (ref 70–99)
Glucose-Capillary: 117 mg/dL — ABNORMAL HIGH (ref 70–99)
Glucose-Capillary: 97 mg/dL (ref 70–99)
Glucose-Capillary: 99 mg/dL (ref 70–99)
Glucose-Capillary: 112 mg/dL — ABNORMAL HIGH (ref 70–99)

## 2022-05-26 MED ORDER — METOPROLOL TARTRATE 25 MG/10 ML ORAL SUSPENSION
50.0000 mg | Freq: Two times a day (BID) | ORAL | Status: DC
  Administered 2022-05-26 – 2022-05-28 (×4): 50 mg
  Filled 2022-05-26 (×4): qty 20

## 2022-05-26 MED ORDER — GUAIFENESIN 100 MG/5ML PO LIQD
15.0000 mL | Freq: Four times a day (QID) | ORAL | Status: DC
  Administered 2022-05-26 – 2022-06-05 (×39): 15 mL
  Filled 2022-05-26 (×39): qty 15

## 2022-05-26 NOTE — Progress Notes (Signed)
Speech Language Pathology TBI Note  Patient Details  Name: Carl Carpenter MRN: [id number] Date of Birth: 1986-08-15  Today's Date: 05/26/2022 SLP Individual Time: 1400-1500 SLP Individual Time Calculation (min): 60 min  Short Term Goals: Week 3: SLP Short Term Goal 1 (Week 3): Pt will consume therapeutic trials of 1/2 teaspoons of thin liquids and ice chips with minimal overt s/s of aspiration over 3 consecutive sessions and max assist use of swallowing precautions prior to MBS or FEES. SLP Short Term Goal 2 (Week 3): Pt will vocalize on command in 25% of opportunities with max assist multimodal cues over 2 sessions. SLP Short Term Goal 3 (Week 3): Pt will answer basic yes/no questions using written choices on a white board and either eye gaze or gesturing in 50% of opportunities with max assist multimodal cues over 2 sessions. SLP Short Term Goal 4 (Week 3): Pt will follow 1 step  commands in  50% of opportunities with max assist multimodal cues over 2 sessions. SLP Short Term Goal 5 (Week 3): Pt will focus his attention on a targeted stimulus for 60 seconds with max assist multimodal cues over 2 sessions.  Skilled Therapeutic Interventions: Skilled treatment session focused on dysphagia and cognitive goals. Upon arrival, patient was awake while upright in the wheelchair. SLP facilitated session by providing oral care via the suction toothbrush. Patient consumed trials of ice chips with consistent overt s/s of aspiration. However, patient demonstrated improved ability to open and close his oral cavity around the spoon with improved oral manipulation. Recommend patient continue NPO. SLP also initiated use of the tobi dynavox with focus on utilizing eye gaze  for use of functional communication. Patient answered basic yes/no questions with 75% accuracy with function impacted by attention to task. SLP also utilized the visual tracking task with patient demonstrating increased success with stimuli in his  left lower quadrant. Patient left upright in wheelchair with family present. Continue with current plan of car.e      Pain No indications of pain   Therapy/Group: Individual Therapy  [name] 05/26/2022, 3:22 PM

## 2022-05-26 NOTE — Progress Notes (Signed)
Physical Therapy Session Note  Patient Details  Name: Carl Carpenter MRN: [id number] Date of Birth: 1986/05/25  Today's Date: 05/26/2022 PT Individual Time: 1500-1600 PT Individual Time Calculation (min): 60 min   Short Term Goals: Week 3:  PT Short Term Goal 1 (Week 3): Pt will attend to therapy task for 5 minutes with max cues. PT Short Term Goal 2 (Week 3): Pt will sit EOB to participate in therapeutic activity with modA +1. PT Short Term Goal 3 (Week 3): Patient with progress therapeutic gait >50 ft with max A +2 for increased activity tolerance and motor planning  Skilled Therapeutic Interventions/Progress Updates:     Pt received seated in WC and in no apparent pain. WC transport to gym for time management. Much of session focused on NMR via therapeutic gait and reciprocal upper and lower extremity coordination. Pt stands in parallel bars initially with light modA +2 but as pt fatigues, he requires maxA +2 for subsequent reps. Multimodal cues for hand placement, anterior trunk lean, sequencing, and power-up. Pt able to maintain standing with minA/modA +2. Verbal and tactile cues for upright posture, hand placement and symmetrical weight bearing. Pt performs multiple bouts of ambulation with extended seated rest breaks, 5x5'. MaxA +2 for ambulation with heavy facilitation of lateral weight shifting, progression of hands (especially L hand which tends to grip on bar and is difficult to release and progress), and maintaining trunk extension. Pt is able to initiate both L and R steps on initial bout of ambulation but on subsequent bouts requires heavy facilitation of R step initiation, but is able to progress L foot with assistance weight shifting.   Pt noted to have soiled brief during session. Slideboard transfer back to bed with totalA +2 and cues for trunk lean, hand placement, and sequencing. Sit to supine with totalA +2 Pt performs bilateral rolling with maxA +2, and bridging with modA and  manual facilitation of hip extension. Pt left supine in bed with family present. Misses 15 minutes at end of session due to fatigue.  Therapy Documentation Precautions:  Precautions Precautions: Fall Precaution Comments: capped trach, PEG, binder Restrictions Weight Bearing Restrictions: No General: PT Amount of Missed Time (min): 15 Minutes PT Missed Treatment Reason: Patient fatigue   Therapy/Group: Individual Therapy  [name], PT, DPT 05/26/2022, 6:03 PM

## 2022-05-26 NOTE — Progress Notes (Signed)
Occupational Therapy Session Note  Patient Details  Name: Carl Carpenter MRN: [id number] Date of Birth: 09/11/1986  Today's Date: 05/26/2022 OT Individual Time: 1100-1200 OT Individual Time Calculation (min): 60 min    Short Term Goals: Week 3:  OT Short Term Goal 1 (Week 3): Caregivers will demonstrate competency with UE PROM OT Short Term Goal 2 (Week 3): Pt will initiate 1 step of UB dressing with max cueing OT Short Term Goal 3 (Week 3): Pt will maintain sitting balance statically with max A  Skilled Therapeutic Interventions/Progress Updates:    Pt received supine with no indications of pain. Focus of session of blocked practice gross motor and initiation trials, as well as attention, arousal, and volitional use of the BUE. Improved initiation of rolling to the R in bed for brief change- mod A. Max A to roll L. He completed bed mobility to EOB with total A +2. He required max A for static sitting balance. He stood from the EOB with mod A +2. He was able to maintain standing with mod A+2. He required max A +3 for 10 ft of functional mobility, requiring max A for BLE limb advancement and for trunk support. He was taken via w/c to the therapy gym. Stand pivot to the mat with max A +2. BLE limb straps helpful for limb advancement. Worked on blocked practice sit <> stands from the EOM with extra time provided for pt initiation of movement. Pt able to advance to only max of 1, second person present for safety. He transitioned into supine with max A +2. Worked on blocked practice straight arm raises with a 2lb dowel to address automatic movement/carryover of functional movement patterns. Little to no carryover. Tested thumbs up/down and pt had no accuracy in thumbs down. Pt returned to his w/c and was returned to his room. He was left sitting up with all needs met, gf present.   Therapy Documentation Precautions:  Precautions Precautions: Fall Precaution Comments: capped trach, PEG,  binder Restrictions Weight Bearing Restrictions: No  Therapy/Group: Individual Therapy  [name] 05/26/2022, 6:32 AM

## 2022-05-26 NOTE — Patient Care Conference (Signed)
Inpatient RehabilitationTeam Conference and Plan of Care Update Date: 05/26/2022   Time: 10:46 AM    [name]      Medical Record Number: [id number]  Date of Birth: 07/14/1986 Sex: Male         Room/Bed: 4W14C/4W14C-01 Payor Info: Payor: MEDICAID [place] / Plan: MEDICAID [place]-[employer]AMILY [employer]ANNING / Product Type: *No Product type* /    Admit Date/Time:  05/08/2022  3:31 PM  Primary Diagnosis:  TBI (traumatic brain injury) [facility])  Hospital Problems: Principal Problem:   TBI (traumatic brain injury) [facility]) Active Problems:   Malnutrition of moderate degree    Expected Discharge Date: Expected Discharge Date: 06/16/22  Team Members Present: Physician leading conference: Dr. Faith Rogue Social Worker Present: Cecile Sheerer, LCSWA Nurse Present: Kennyth Arnold, RN PT Present: Malachi Pro, PT OT Present: Kearney Hard, OT SLP Present: Feliberto Gottron, SLP PPS Coordinator present : Edson Snowball, PT     Current Status/Progress Goal Weekly Team Focus  Bowel/Bladder   Incoontinent B/B. LBM 05/24/22  Regain Continence  Continue with male incontinence device.   Swallow/Nutrition/ Hydration   NPO with PEG  Mod A with least restrictive diet  trials of ice chips, oral manipulatin, swallow initation-asses readiness for MBS   ADL's   Still consistent with a RLAS III. Initiation within functional context is still very inconsistent but with gross motor he has improved significantly with initiation and carryover. Still at a heavy burden to family- total A +2 level care  mod-max A overall.  Simple command following, arousal, attention, transfers, ADLs   Mobility   max to totalA +2 all mobility, gait ~10' with +3 assist  maxA bed level  sitting balance, attention, visual tracking, transers, standing tolerance, gait, caregiver education   Communication   Nonverbal, Max-Total A for use of multimodal communication  Mod A  establish a basic form of communication to express wants/needs,  purposeful phonation   Safety/Cognition/ Behavioral Observations  Rancho Level III-IV, Total A  Mod A  focused attention, initiation, following commands with purposeful behaviors   Pain   No Indication of pain (FACES)  Continue assessment using NV indicators and treat  Assess Q4 hrly and prn   Skin   Redness aroung peg-tube. Follow dressing change plan  Prevent new skin breakdown        Discharge Planning:  Pt is uninsured.  Pt will d/c to home with significant other and will have 24/7 care from her, and PRN support from other family members.   Team Discussion: Feeding tube in place. Making slow gains. Maxed out on stimulants. Nutrition improving. Incontinent B/B, no reported pain. Abrasion to left knee OTA. Unsure of Medicaid status. Rancho III. Will schedule family meeting.   Patient on target to meet rehab goals: Mod/max goals. Gross motor/carryover total assist +2. Initiated gait training 10 ft, total assist to start. No swallowing gains. Apraxic open mouth around spoon. Inconsistent.   *See Care Plan and progress notes for long and short-term goals.   Revisions to Treatment Plan:  Adjusting medications   Teaching Needs: Family education, medication management, bowel/bladder management, skin/wound care, transfer/gait training, etc.   Current Barriers to Discharge: Incontinence, Wound care, and Insurance for SNF coverage  Possible Resolutions to Barriers: Family education Schedule family meeting Continue therapy     Medical Summary Current Status: more alert, some purposeful behavior, still RLAS III/IV. uses left side preferentially  Barriers to Discharge: Medical stability;Behavior;Nutrition means   Possible Resolutions to [employer]: daily stimulation/ normalization  of day/njight. pharmaceutical stim   Continued Need for Acute Rehabilitation Level of Care: The patient requires daily medical management by a physician with specialized training in physical  medicine and rehabilitation for the following reasons: Direction of a multidisciplinary physical rehabilitation program to maximize functional independence : Yes Medical management of patient stability for increased activity during participation in an intensive rehabilitation regime.: Yes Analysis of laboratory values and/or radiology reports with any subsequent need for medication adjustment and/or medical intervention. : Yes   I attest that I was present, lead the team conference, and concur with the assessment and plan of the team.   Tennis Must 05/26/2022, 3:33 PM

## 2022-05-26 NOTE — Progress Notes (Signed)
PROGRESS NOTE   Subjective/Complaints: No major issues. SO feels that he's engaging more. No problems over night. Still "fidgety"  ROS: Limited due to cognitive/behavioral   Objective:   No results found. Recent Labs    05/25/22 0629  WBC 7.6  HGB 13.7  HCT 38.4*  PLT 184     Recent Labs    05/25/22 0629  NA 134*  K 3.8  CL 101  CO2 26  GLUCOSE 96  BUN 14  CREATININE 0.83  CALCIUM 9.7      Intake/Output Summary (Last 24 hours) at 05/26/2022 1139 Last data filed at 05/25/2022 2220 Gross per 24 hour  Intake --  Output 1000 ml  Net -1000 ml          Physical Exam: Vital Signs Blood pressure 121/74, pulse 70, temperature 98.4 F (36.9 C), resp. rate 17, height 6\' 5"  (1.956 m), weight 101.6 kg, SpO2 96 %.  Constitutional: No distress . Vital signs reviewed. HEENT: NCAT, EOMI, oral membranes moist Neck: supple Cardiovascular: RRR without murmur. No JVD    Respiratory/Chest: CTA Bilaterally without wheezes or rales. Normal effort    GI/Abdomen: BS +, non-tender, non-distended. PEG clean Ext: no clubbing, cyanosis, or edema Psych: flat, limited engagement Musculoskeletal:        no pain with ROM  Skin:    Comments: left knee abrasion with scar Neurological:     Comments: intermittent eye contact. Performs simple movements with left hand  DTR's brisk on right.   Possible mild flexor tone in right hand. Engaging with RUE and RLE inconsistently.  Fist bumped with me today. Remains non-verbal. Mild tremor  Assessment/Plan: 1. Functional deficits which require 3+ hours per day of interdisciplinary therapy in a comprehensive inpatient rehab setting. Physiatrist is providing close team supervision and 24 hour management of active medical problems listed below. Physiatrist and rehab team continue to assess barriers to discharge/monitor patient progress toward functional and medical goals  Care  Tool:  Bathing    Body parts bathed by patient: Right arm, Left arm, Chest, Abdomen, Front perineal area, Buttocks, Right upper leg, Left upper leg, Right lower leg, Left lower leg   Body parts bathed by helper: Right arm, Right lower leg, Left lower leg, Chest, Left arm, Front perineal area, Buttocks, Right upper leg, Left upper leg, Abdomen, Face     Bathing assist Assist Level: 2 Helpers     Upper Body Dressing/Undressing Upper body dressing   What is the patient wearing?: Pull over shirt    Upper body assist Assist Level: 2 Helpers    Lower Body Dressing/Undressing Lower body dressing      What is the patient wearing?: Pants, Incontinence brief     Lower body assist Assist for lower body dressing: 2 Helpers     Toileting Toileting    Toileting assist Assist for toileting: 2 Helpers     Transfers Chair/bed transfer  Transfers assist  Chair/bed transfer activity did not occur: Safety/medical concerns  Chair/bed transfer assist level: 2 Helpers     Locomotion Ambulation   Ambulation assist   Ambulation activity did not occur: Safety/medical concerns  Walk 10 feet activity   Assist  Walk 10 feet activity did not occur: Safety/medical concerns        Walk 50 feet activity   Assist Walk 50 feet with 2 turns activity did not occur: Safety/medical concerns         Walk 150 feet activity   Assist Walk 150 feet activity did not occur: Safety/medical concerns         Walk 10 feet on uneven surface  activity   Assist Walk 10 feet on uneven surfaces activity did not occur: Safety/medical concerns         Wheelchair     Assist Is the patient using a wheelchair?: Yes Type of Wheelchair: Manual    Wheelchair assist level: Dependent - Patient 0%      Wheelchair 50 feet with 2 turns activity    Assist        Assist Level: Dependent - Patient 0%   Wheelchair 150 feet activity     Assist      Assist  Level: Dependent - Patient 0%   Blood pressure 121/74, pulse 70, temperature 98.4 F (36.9 C), resp. rate 17, height 6\' 5"  (1.956 m), weight 101.6 kg, SpO2 96 %.    Medical Problem List and Plan: 1. 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 Interdisciplinary team conference today to discuss goals, barriers to discharge, and dc planning.  Discussed need for family conference. 2.  DVT right posterior tibial and peroneal veins (03/22/22): Discussed with mother that repeat doppler shows no new clots and resolution of one previous clot.  -anticoagulation: continue Eliquis             -antiplatelet therapy: none 3. Pain: continueTylenol prn 4. Mood: LCSW to evaluate and provide emotional support             -antipsychotic agents: none 5. Impaired attention: This patient is not capable of making decisions on his own behalf.             -continue amantadine 200mg  bid             -increased ritalin to 25mg  bid 5/31--seems to be having effect on attention and initiation without causing anxiety/agitation               6. Dysphagia. S/p PEG 3/29.  --NPO -continue TF/boluses -Continue zinc oxide to protect skin surrounding PEG tube -5/30 replaced PEG under fluoro. Appreciate IR help -PEG sit looks clean currently 7. Fluids/Electrolytes/Nutrition: Routine Is and Os and follow-up chemistries             --continue tube feeds Jevity 1.5 boluses             --continue Prosource             - prealbumin 35.8, gaining weight Filed Weights   05/24/22 0624 05/25/22 0409 05/26/22 0519  Weight: 101.2 kg 103.9 kg 101.6 kg    9: C1 right transverse process fracture--he's out of  collar 10:  Left Occipital condyle fracture--out of collar 11: Acute ventilator respiratory failure requiring trach and PEG 3/29              --decannulated , stoma closed             --change Robitussin to 25 mL qid  -continue robinul via PEG to assist with cough    12: Right ear laceration:  healing 14: Tongue laceration: monitor healing 15: Hypertension: increased Lopressor to 62.5 mg BID      -6/6 reduce lopressor to 50mg  bid  -observe today    05/26/2022    5:19 AM 05/25/2022    7:35 PM 05/25/2022    3:39 PM  Vitals with BMI  Weight 224 lbs    BMI 0000000    Systolic [id number]  Diastolic 74 81 91  Pulse 70 84 87    16. Hypoglycemic to 69: otherwise in 90s: will d/c ISS since not requiring   18. Iron deficiency:  continue daily iron supplement  19. Tremor,?clonus: continue trial of propranolol 10mg  tid  propranolol 10 mg tid seems effective   -consider increasing to replace metoprolol  LOS: 18 days A FACE TO FACE EVALUATION WAS PERFORMED  Meredith Staggers 05/26/2022, 11:39 AM

## 2022-05-26 NOTE — Progress Notes (Signed)
Patient ID: Carl Carpenter, male   DOB: [birth year], 36 y.o.   MRN: [id number]  SW met with pt s/o [name] and pt father in room to provide updates from team conference, inform on d/c date 6/27, and discuss family meeting. SW also informed on s till waiting on Medicaid confirmation as remains listed as family planning at this time. Family will follow-up with SW about scheduling family meeting likely to be next week Thursday or Friday.   Loralee Pacas, MSW, [place] Office: [phone] Cell: [phone] Fax: [phone]

## 2022-05-27 LAB — GLUCOSE, CAPILLARY
Glucose-Capillary: 103 mg/dL — ABNORMAL HIGH (ref 70–99)
Glucose-Capillary: 103 mg/dL — ABNORMAL HIGH (ref 70–99)
Glucose-Capillary: 105 mg/dL — ABNORMAL HIGH (ref 70–99)
Glucose-Capillary: 115 mg/dL — ABNORMAL HIGH (ref 70–99)
Glucose-Capillary: 115 mg/dL — ABNORMAL HIGH (ref 70–99)
Glucose-Capillary: 127 mg/dL — ABNORMAL HIGH (ref 70–99)

## 2022-05-27 NOTE — Progress Notes (Signed)
Occupational Therapy Session Note  Patient Details  Name: Carl Carpenter MRN: [id number] Date of Birth: [birth year]  Today's Date: 05/27/2022 OT Individual Time: 1104-1200 OT Individual Time Calculation (min): 56 min    Short Term Goals: Week 3:  OT Short Term Goal 1 (Week 3): Caregivers will demonstrate competency with UE PROM OT Short Term Goal 2 (Week 3): Pt will initiate 1 step of UB dressing with max cueing OT Short Term Goal 3 (Week 3): Pt will maintain sitting balance statically with max A  Skilled Therapeutic Interventions/Progress Updates:    Pt received in the w/c with no indications of pain, his father present at beginning and end of session. First half of session focused on initiation and motor planning of self care tasks and second half on using Tob[employer] Dynavox eye gaze system. Provided increased time to allow for initiation/problem solving of self care task initiation. Despite more AROM in the LUE, he initiated self care tasks more often with the RUE. HOH provided to encourage use and promote error less learning. Pt required max HOH today to initiate and carry through simple oral care and face washing tasks. He stood at the sink with max A +2 for brief change. Total A +2 for hygiene and change. Pt was then taken via TIS w/c to the therapy gym for eye gaze trials. He was unable to maintain gaze stabilization on yes/no questions after 2 accurate/inaccurate responses. He did attend better to a gaze tracking game- with 60% correct tracking and very brief stabilization. He began tugging at his pants and was unable to attend on anything but this. Upon inspection pt found to be heavily incontinent of urine. He transferred back to bed with max A +2 stand pivot. +3 required for safety. Total A +2 bed level hygiene and clothes change. He was left supine with all needs met, father present.   Therapy Documentation Precautions:  Precautions Precautions: Fall Precaution Comments: capped trach, PEG,  binder Restrictions Weight Bearing Restrictions: No   Therapy/Group: Individual Therapy  [name] 05/27/2022, 6:36 AM

## 2022-05-27 NOTE — Progress Notes (Signed)
PROGRESS NOTE   Subjective/Complaints: Dad reports he had a good night. Said "I love you" to fiancee yesterday which obviously struck an emotional chord for family  ROS: Limited due to cognitive/behavioral   Objective:   No results found. Recent Labs    05/25/22 0629  WBC 7.6  HGB 13.7  HCT 38.4*  PLT 184     Recent Labs    05/25/22 0629  NA 134*  K 3.8  CL 101  CO2 26  GLUCOSE 96  BUN 14  CREATININE 0.83  CALCIUM 9.7     No intake or output data in the 24 hours ending 05/27/22 0857         Physical Exam: Vital Signs Blood pressure 123/75, pulse 73, temperature (!) 97.5 F (36.4 C), temperature source Oral, resp. rate 17, height 6\' 5"  (1.956 m), weight 102.1 kg, SpO2 94 %.  Constitutional: No distress . Vital signs reviewed. HEENT: NCAT, EOMI, oral membranes moist Neck: supple Cardiovascular: RRR without murmur. No JVD    Respiratory/Chest: CTA Bilaterally without wheezes or rales. Normal effort    GI/Abdomen: BS +, non-tender, non-distended, PEG Ext: no clubbing, cyanosis, or edema Psych: flat Musculoskeletal:        no pain with ROM  Skin:    Comments: left knee abrasion with scar Neurological:     Comments: ilimited engagement today. Just waking up.   DTR's brisk on right.   Possible mild flexor tone in right hand. Engages with RUE and RLE inconsistently.   Remains non-verbal. Mild tremor  Assessment/Plan: 1. Functional deficits which require 3+ hours per day of interdisciplinary therapy in a comprehensive inpatient rehab setting. Physiatrist is providing close team supervision and 24 hour management of active medical problems listed below. Physiatrist and rehab team continue to assess barriers to discharge/monitor patient progress toward functional and medical goals  Care Tool:  Bathing    Body parts bathed by patient: Right arm, Left arm, Chest, Abdomen, Front perineal area, Buttocks,  Right upper leg, Left upper leg, Right lower leg, Left lower leg   Body parts bathed by helper: Right arm, Right lower leg, Left lower leg, Chest, Left arm, Front perineal area, Buttocks, Right upper leg, Left upper leg, Abdomen, Face     Bathing assist Assist Level: 2 Helpers     Upper Body Dressing/Undressing Upper body dressing   What is the patient wearing?: Pull over shirt    Upper body assist Assist Level: 2 Helpers    Lower Body Dressing/Undressing Lower body dressing      What is the patient wearing?: Pants, Incontinence brief     Lower body assist Assist for lower body dressing: 2 Helpers     Toileting Toileting    Toileting assist Assist for toileting: 2 Helpers     Transfers Chair/bed transfer  Transfers assist  Chair/bed transfer activity did not occur: Safety/medical concerns  Chair/bed transfer assist level: 2 Helpers     Locomotion Ambulation   Ambulation assist   Ambulation activity did not occur: Safety/medical concerns          Walk 10 feet activity   Assist  Walk 10 feet activity did not occur: Safety/medical  concerns        Walk 50 feet activity   Assist Walk 50 feet with 2 turns activity did not occur: Safety/medical concerns         Walk 150 feet activity   Assist Walk 150 feet activity did not occur: Safety/medical concerns         Walk 10 feet on uneven surface  activity   Assist Walk 10 feet on uneven surfaces activity did not occur: Safety/medical concerns         Wheelchair     Assist Is the patient using a wheelchair?: Yes Type of Wheelchair: Manual    Wheelchair assist level: Dependent - Patient 0%      Wheelchair 50 feet with 2 turns activity    Assist        Assist Level: Dependent - Patient 0%   Wheelchair 150 feet activity     Assist      Assist Level: Dependent - Patient 0%   Blood pressure 123/75, pulse 73, temperature (!) 97.5 F (36.4 C), temperature source  Oral, resp. rate 17, height 6\' 5"  (1.956 m), weight 102.1 kg, SpO2 94 %.    Medical Problem List and Plan: 1. 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 Dad seemed to understand but was hopeful to see more progress before he leaves.  2.  DVT right posterior tibial and peroneal veins (03/22/22): Discussed with mother that repeat doppler shows no new clots and resolution of one previous clot.  -anticoagulation: continue Eliquis             -antiplatelet therapy: none 3. Pain: continueTylenol prn 4. Mood: LCSW to evaluate and provide emotional support             -antipsychotic agents: none 5. Impaired attention: This patient is not capable of making decisions on his own behalf.             -continue amantadine 200mg  bid             -increased ritalin to 25mg  bid 5/31-continue               6. Dysphagia. S/p PEG 3/29.  --NPO -continue TF/boluses -Continue zinc oxide to protect skin surrounding PEG tube -5/30 replaced PEG under fluoro. Appreciate IR help -PEG sit looks clean currently 7. Fluids/Electrolytes/Nutrition: Routine Is and Os and follow-up chemistries             --continue tube feeds Jevity 1.5 boluses             --continue Prosource             - prealbumin 35.8, gaining weight Filed Weights   05/25/22 0409 05/26/22 0519 05/27/22 0426  Weight: 103.9 kg 101.6 kg 102.1 kg    9: C1 right transverse process fracture--he's out of collar 10: Left Occipital condyle fracture--out of collar 11: Acute ventilator respiratory failure requiring trach and PEG  3/29              --decannulated ,  stoma closed             --changed Robitussin to 25 mL qid  -continue robinul via PEG to assist with cough    12: Right ear laceration:  healing 14: Tongue laceration: monitor healing 15: Hypertension: increased Lopressor to 62.5 mg BID      -6/6 reduce lopressor to 50mg  bid  -observe for increase in HR    05/27/2022    4:26 AM 05/26/2022    7:31 PM 05/26/2022    2:05 PM  Vitals with BMI  Weight 225 lbs    BMI 26.68    Systolic 123 127 07/26/2022  Diastolic 75 87 82  Pulse 73 92 89    16. Hypoglycemic to 69: otherwise in 90s: will d/c ISS since not requiring   18. Iron deficiency:  continue daily iron supplement  19. Tremor,?clonus: continue trial of propranolol 10mg  tid  propranolol 10 mg tid seems effective   -consider increasing to replace metoprolol  LOS: 19 days A FACE TO FACE EVALUATION WAS PERFORMED  332 05/27/2022, 8:57 AM

## 2022-05-27 NOTE — Progress Notes (Signed)
Slept well last night. Sleep chart documentation done. Tube feeds last night after peg tube placement verified. Safety maintained at all times.

## 2022-05-27 NOTE — Progress Notes (Signed)
Physical Therapy Session Note  Patient Details  Name: Carl Carpenter MRN: [id number] Date of Birth: May 18, 1986  Today's Date: 05/27/2022 PT Individual Time: 7939-0300 PT Individual Time Calculation (min): 71 min   Short Term Goals: Week 3:  PT Short Term Goal 1 (Week 3): Pt will attend to therapy task for 5 minutes with max cues. PT Short Term Goal 2 (Week 3): Pt will sit EOB to participate in therapeutic activity with modA +1. PT Short Term Goal 3 (Week 3): Patient with progress therapeutic gait >50 ft with max A +2 for increased activity tolerance and motor planning  Skilled Therapeutic Interventions/Progress Updates:     Pt received supine in bed and in no apparent pain. Purewick removed and pt performs bridging with maxA +2 to assist with donning clean brief and pants. Supine to sit with totalA +2. Pt performs slideboard transfer to [facility] with totalA +2 and cues for body mechanics, initiation, and sequencing. WC transport to gym for time management. Slideboard transfer to mat table with totalA +2. Session primarily focused on NMR for sitting balance, activity tolerance, and core strength. Pt able to maintain static sitting balance without external support, initially holding onto mat table with both hands. PT provides multimodal cues for improving posture, midline orientation, and hand placement. Pt tendency is to drift posteriorly and to the R, but is able to maintain static sitting with close supervision for up to 3-4 minutes at a time. Pt assisted into forward flexed position with forearms supported on thighs to work on balance and optimal positioning for functional transfers. Pt also assisted into both R and L sideleaning positions, requiring modA initially but able to maintain with minA overall. Pt performs slideboard transfer to [facility] with totalA +2 and cues for trunk lean and sequencing. Pt stands from [facility] with maxA +2 3 musketeers technique. With pt standing, PT provides multimodal cues for  neutral pelvic positioning, posture, upright gaze, symmetrical WB between R and L, and neutral trunk position. Pt maintains static standing with modA +2 overall. Pt left seated in WC with alarm intact and all needs within reach.  Therapy Documentation Precautions:  Precautions Precautions: Fall Precaution Comments: PEG with binder Restrictions Weight Bearing Restrictions: No    Therapy/Group: Individual Therapy  [name], PT, DPT 05/27/2022, 12:48 PM

## 2022-05-27 NOTE — Progress Notes (Signed)
Speech Language Pathology TBI Note  Patient Details  Name: Carl Carpenter MRN: [id number] Date of Birth: [birth year]  Today's Date: 05/27/2022 SLP Individual Time: 1400-1510 SLP Individual Time Calculation (min): 70 min  Short Term Goals: Week 3: SLP Short Term Goal 1 (Week 3): Pt will consume therapeutic trials of 1/2 teaspoons of thin liquids and ice chips with minimal overt s/s of aspiration over 3 consecutive sessions and max assist use of swallowing precautions prior to MBS or FEES. SLP Short Term Goal 2 (Week 3): Pt will vocalize on command in 25% of opportunities with max assist multimodal cues over 2 sessions. SLP Short Term Goal 3 (Week 3): Pt will answer basic yes/no questions using written choices on a white board and either eye gaze or gesturing in 50% of opportunities with max assist multimodal cues over 2 sessions. SLP Short Term Goal 4 (Week 3): Pt will follow 1 step  commands in  50% of opportunities with max assist multimodal cues over 2 sessions. SLP Short Term Goal 5 (Week 3): Pt will focus his attention on a targeted stimulus for 60 seconds with max assist multimodal cues over 2 sessions.  Skilled Therapeutic Interventions: Skilled treatment session focused on cognitive and communication goals. Upon arrival, patient was awake in bed. Patient transferred to the wheelchair via the Slideboard with +3 assist for safety. SLP conducted the session with the patient in ~45 degrees in the tilt-in-space wheelchair to help facilitate patient's ability to activate his diaphragm and create overall tension in his vocal cords for phonation. Patient phonated X 2 non-purposefully but was unable to phonate purposefully despite Max A multimodal cues. However, patient was visibly trying with increased movement of his oral musculature. SLP also utilized the tobi dynavox with focus on following commands by choosing an item/stimuli from a field of 2. This also created auditory feedback to help facilitate  verbalizations without success. Patient demonstrated increased focused attention to task. Patient left upright in wheelchair with alarm on and all needs within reach. Continue with current plan of care.      Pain No indications of pain   Agitated Behavior Scale: TBI No unsafe behavior  Therapy/Group: Individual Therapy  Dessie Tatem 05/27/2022, 3:31 PM

## 2022-05-28 LAB — GLUCOSE, CAPILLARY
Glucose-Capillary: 106 mg/dL — ABNORMAL HIGH (ref 70–99)
Glucose-Capillary: 111 mg/dL — ABNORMAL HIGH (ref 70–99)
Glucose-Capillary: 115 mg/dL — ABNORMAL HIGH (ref 70–99)
Glucose-Capillary: 142 mg/dL — ABNORMAL HIGH (ref 70–99)
Glucose-Capillary: 83 mg/dL (ref 70–99)

## 2022-05-28 MED ORDER — PROPRANOLOL HCL 20 MG/5ML PO SOLN
20.0000 mg | Freq: Three times a day (TID) | ORAL | Status: DC
  Administered 2022-05-28 – 2022-05-30 (×6): 20 mg
  Filled 2022-05-28 (×7): qty 5

## 2022-05-28 MED ORDER — METOPROLOL TARTRATE 25 MG/10 ML ORAL SUSPENSION
25.0000 mg | Freq: Two times a day (BID) | ORAL | Status: DC
  Administered 2022-05-28 – 2022-05-29 (×2): 25 mg
  Filled 2022-05-28 (×2): qty 10

## 2022-05-28 NOTE — Progress Notes (Signed)
Physical Therapy Weekly Progress Note  Patient Details  Name: Carl Carpenter MRN: [id number] Date of Birth: 31-May-1986  Beginning of progress report period: May 21, 2022 End of progress report period: May 28, 2022  Today's Date: 05/28/2022 PT Individual Time: 1045-1200 and 3614-4315 PT Individual Time Calculation (min): 75 min and 56 min  Patient has met 1 of 3 short term goals. Pt is progressing slowly toward mobility goals, improving sitting balance and able to maintaining short sitting at EOB with CGA/minA for 2-3 minutes at a time. Pt tends to drift posteriorly and to the R but demonstrates some improving righting reactions and protective responses. Pt still requires totalA for bed to chair transfers and bed mobility, as well as short distance therapeutic ambulation. Pt will benefit from hands-on family education prior to discharge.  Patient continues to demonstrate the following deficits muscle weakness, decreased cardiorespiratoy endurance, impaired timing and sequencing, abnormal tone, unbalanced muscle activation, motor apraxia, decreased coordination, and decreased motor planning, decreased initiation, decreased attention, decreased awareness, decreased problem solving, decreased safety awareness, decreased memory, and delayed processing, and decreased sitting balance, decreased standing balance, decreased postural control, hemiplegia, and decreased balance strategies and therefore will continue to benefit from skilled PT intervention to increase functional independence with mobility.  Patient progressing toward long term goals..  Continue plan of care.  PT Short Term Goals Week 3:  PT Short Term Goal 1 (Week 3): Pt will attend to therapy task for 5 minutes with max cues. PT Short Term Goal 1 - Progress (Week 3): Progressing toward goal PT Short Term Goal 2 (Week 3): Pt will sit EOB to participate in therapeutic activity with modA +1. PT Short Term Goal 2 - Progress (Week 3): Met PT  Short Term Goal 3 (Week 3): Patient with progress therapeutic gait >50 ft with max A +2 for increased activity tolerance and motor planning PT Short Term Goal 3 - Progress (Week 3): Progressing toward goal Week 4:  PT Short Term Goal 1 (Week 4): Pt will attend to therapy task for 5 minutes with max cues. PT Short Term Goal 2 (Week 4): Pt will completes bed to chair transfer with max/totalA +1. PT Short Term Goal 3 (Week 4): Patient with progress therapeutic gait >50 ft with max A +2 for increased activity tolerance and motor planning  Skilled Therapeutic Interventions/Progress Updates:     1st Session: Pt received supine in bed and in no apparent pain. PT assists pt into hooklying position and facilitates hip extension to help don lower body dressing. Supine to sit with totalA +2 and cues for initiation and sequencing. Pt performs slideboard transfer to [facility] with totalA +2 and cues for anterior trunk lean, hand placement and initiation. WC transport to gym for time management. Pt completes sit to stand in [place] with maxA +2. Pt participates in NMR for standing and sitting balance in [place] for majority of session. In standing, pt at times able to maintain midline positioning with close-to-neutral trunk and posture with minA to modA +2. Multimodal cues for hip extension, R knee extension, R elbow extension and use of R upper extremity to assist with balance. Mirror provided for visual feedback. Pt requires modA ot maxA +2 to transition from standing to sitting due to difficulty motor planning. Pt able to remain "perched" in [place] with minA to modA+2 and cues for posture and hand placement. At times, pt attempts to stand from elevated stedy seat and is able to go from sit to stand with  CGA/minA +1.  Pt attempts ambulation with 3 musketeers technique, standing with maxA +2. Pt ambulates x5' with totalA +2 and +3 to assist with initiation push off for step progression. Pt appears very fatigued and not able to  functionally assist with ambulation so activity terminated after 5'. WC transport back to room. Squat pivot to bed and sit to supine with totalA +2. Left supine with alarm intact and all need within reach.  2nd Session: Pt received supine in bed and in no apparent pain. Pt completes bilateral rolling with maxA +2 to doff soiled brief and and don clean brief and shorts. Supine to sit with totalA +2. Squat pivot to [facility] with totalA +2 and cues for anterior trunk lean, hand placement, and sequencing. WC transport to gym. Pt completes multiple reps of sit to stand during session with max to totalA +2, with cues for initiation, body mechanics, and sequencing. Pt stands to EVA walker to promote upright posture and trunk stability. Pt ambulates x18' with EVA walker and max/totalA +2. Pt able to progress L lower extremity majority of time without manual assistance but requires manual assistance for initiation of R lower extremity progression. Pt initiates R leg progression 1-2x during gait training. After ambulating above distance, pt stands with mirror for visual feedback and PT provides multimodal cues for posture, hand placement, upright gaze, and hip and knee extension. Pt takes extended seated rest break and requires modA +2 for static sitting balance. Pt ambulates x15' with EVA walker and same assist but has +3 for WC follow for safety. WC transport back to room. Squat pivot to bed and sit to supine with totalA +2. Pt performs bilateral rolling with maxA +2 and cues for body mechanics to assist with donning clean brief as he had again soiled brief with urine. Left supine with alarm intact and all needs within reach.  Therapy Documentation Precautions:  Precautions Precautions: Fall Precaution Comments: PEG with binder Restrictions Weight Bearing Restrictions: No   Therapy/Group: Individual Therapy  Breck Coons, PT, DPT 05/28/2022, 4:59 PM

## 2022-05-28 NOTE — Progress Notes (Signed)
Slept well last night. Significant other stayed overnight. Appeared to be mumbling speech. Lip movement noted. Sensations present in both Upper and lower limbs this shift. Tolerated tube feeds. Night bath offered. Remains on air mattress. Safety maintained.

## 2022-05-28 NOTE — Progress Notes (Signed)
Nutrition Follow-up  DOCUMENTATION CODES:   Non-severe (moderate) malnutrition in context of acute illness/injury  INTERVENTION:   Continue Tube feed via PEG:  2 containers of Jevity 1.5 - QID 45 mL ProSource TF - QID 50 mL free water before and after each feeding Provides: 3000 kcal, 164 grams protein, and 1440 mL free water (1840 mL total free water).   NUTRITION DIAGNOSIS:   Moderate Malnutrition related to acute illness as evidenced by moderate fat depletion, moderate muscle depletion. - Ongoing  GOAL:   Patient will meet greater than or equal to 90% of their needs - Being met via TF  MONITOR:   TF tolerance, Weight trends  REASON FOR ASSESSMENT:   New TF    ASSESSMENT:   Pt with recent hospitalization 3/21 - 5/19 for severe TBI after [facility].   5/30 - PEG replaced in IR  Pt laying in bed, just finished with therapy; family at bedside.  Family reports that he has been tolerating TF for the most part. States that on occasion he will look like he is in discomfort. Discussed that the volume of formula and meds may be administered at the same time and could be too much volume.   Family with no other questions or concerns at this time.   Medications reviewed and include: Colace, Ferrous Sulfate,  Labs reviewed: 24 hr CBG 103-142  Diet Order:   Diet Order             Diet NPO time specified  Diet effective now                   EDUCATION NEEDS:   Not appropriate for education at this time  Skin:  Skin Assessment: Reviewed RN Assessment  Last BM:  6/6  Height:  Ht Readings from Last 1 Encounters:  05/11/22 6' 5"  (1.956 m)   Weight:  Wt Readings from Last 1 Encounters:  05/27/22 102.1 kg   BMI:  Body mass index is 26.68 kg/m.  Estimated Nutritional Needs:  Kcal:  2800-3000 Protein:  150-170 grams Fluid:  >2.8 L/day   Hermina Barters RD, LDN Clinical Dietitian See [facility] for contact information.

## 2022-05-28 NOTE — Progress Notes (Addendum)
Patient ID: Carl Carpenter, male   DOB: Jul 09, 1986, 36 y.o.   MRN: [id number]  SW spoke with [facility] DSS MCD CM to discuss his Medicaid status. Reports Keyed  yesterday and takes 24hrs to be effective. States she is willing to assist with placing referral for CAP/DA if pt d/c to [facility]. SW informed will confirm after family meeting next week d/c location and will f/u if assistance is needed.   SW updated appropriate staff on above updates, and asked to run insurance again.   SW received updates from [employer] stating pt medicaid is now active, and insurance coverage is [employer].   *SW received spoke with pt partner [name] who reported family would like family meeting next Thursday (6/15) at 10am. SW updated medical team.   Cecile Sheerer, MSW, LCSWA Office: [phone] Cell: [phone] Fax: [phone]

## 2022-05-28 NOTE — Progress Notes (Signed)
PROGRESS NOTE   Subjective/Complaints: Had a good night sleep. Alert, looking at me when I entered room. Thumbs up on left!  ROS: limited due to cognitive   Objective:   No results found. No results for input(s): "WBC", "HGB", "HCT", "PLT" in the last 72 hours.    No results for input(s): "NA", "K", "CL", "CO2", "GLUCOSE", "BUN", "CREATININE", "CALCIUM" in the last 72 hours.     Intake/Output Summary (Last 24 hours) at 05/28/2022 1046 Last data filed at 05/28/2022 0900 Gross per 24 hour  Intake --  Output 800 ml  Net -800 ml           Physical Exam: Vital Signs Blood pressure 111/72, pulse 74, temperature 97.8 F (36.6 C), resp. rate 18, height 6\' 5"  (1.956 m), weight 102.1 kg, SpO2 96 %.  Constitutional: No distress . Vital signs reviewed. HEENT: NCAT, EOMI, oral membranes moist Neck: supple Cardiovascular: RRR without murmur. No JVD    Respiratory/Chest: CTA Bilaterally without wheezes or rales. Normal effort    GI/Abdomen: BS +, non-tender, non-distended, PEG site with mild redness, no drainage Ext: no clubbing, cyanosis, or edema Psych: pleasant and cooperative  Musculoskeletal:        no pain with ROM  Skin:    Comments: left knee abrasion with scar Neurological:     Comments: very alert, good eye contact. Seemed almost to attempt to speak, ?smiled at me when I was kidding around.    DTR's brisk on right.   Possible mild flexor tone in right hand. Engages with RUE and RLE inconsistently.     Mild tremor  Assessment/Plan: 1. Functional deficits which require 3+ hours per day of interdisciplinary therapy in a comprehensive inpatient rehab setting. Physiatrist is providing close team supervision and 24 hour management of active medical problems listed below. Physiatrist and rehab team continue to assess barriers to discharge/monitor patient progress toward functional and medical goals  Care  Tool:  Bathing    Body parts bathed by patient: Right arm, Left arm, Chest, Abdomen, Front perineal area, Buttocks, Right upper leg, Left upper leg, Right lower leg, Left lower leg   Body parts bathed by helper: Right arm, Right lower leg, Left lower leg, Chest, Left arm, Front perineal area, Buttocks, Right upper leg, Left upper leg, Abdomen, Face     Bathing assist Assist Level: 2 Helpers     Upper Body Dressing/Undressing Upper body dressing   What is the patient wearing?: Pull over shirt    Upper body assist Assist Level: 2 Helpers    Lower Body Dressing/Undressing Lower body dressing      What is the patient wearing?: Pants, Incontinence brief     Lower body assist Assist for lower body dressing: 2 Helpers     Toileting Toileting    Toileting assist Assist for toileting: 2 Helpers     Transfers Chair/bed transfer  Transfers assist  Chair/bed transfer activity did not occur: Safety/medical concerns  Chair/bed transfer assist level: 2 Helpers     Locomotion Ambulation   Ambulation assist   Ambulation activity did not occur: Safety/medical concerns          Walk 10 feet activity  Assist  Walk 10 feet activity did not occur: Safety/medical concerns        Walk 50 feet activity   Assist Walk 50 feet with 2 turns activity did not occur: Safety/medical concerns         Walk 150 feet activity   Assist Walk 150 feet activity did not occur: Safety/medical concerns         Walk 10 feet on uneven surface  activity   Assist Walk 10 feet on uneven surfaces activity did not occur: Safety/medical concerns         Wheelchair     Assist Is the patient using a wheelchair?: Yes Type of Wheelchair: Manual    Wheelchair assist level: Dependent - Patient 0%      Wheelchair 50 feet with 2 turns activity    Assist        Assist Level: Dependent - Patient 0%   Wheelchair 150 feet activity     Assist      Assist  Level: Dependent - Patient 0%   Blood pressure 111/72, pulse 74, temperature 97.8 F (36.6 C), resp. rate 18, height 6\' 5"  (1.956 m), weight 102.1 kg, SpO2 96 %.    Medical Problem List and Plan: 1. 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  DVT right posterior tibial and peroneal veins (03/22/22): Discussed with mother that repeat doppler shows no new clots and resolution of one previous clot.  -anticoagulation: continue Eliquis             -antiplatelet therapy: none 3. Pain: continueTylenol prn 4. Mood: LCSW to evaluate and provide emotional support             -antipsychotic agents: none 5. Impaired attention: This patient is not capable of making decisions on his own behalf.             -continue amantadine 200mg  bid             - ritalin  25mg  bid 5/31-continue               6. Dysphagia. S/p PEG 3/29.  --NPO -continue TF/boluses -Continue zinc oxide to protect skin surrounding PEG tube -5/30 replaced PEG under fluoro. Appreciate IR help -PEG sit looks clean currently 7. Fluids/Electrolytes/Nutrition: Routine Is and Os and follow-up chemistries             --continue tube feeds Jevity 1.5 boluses             --continue Prosource             - prealbumin 35.8, gaining weight Filed Weights   05/25/22 0409 05/26/22 0519 05/27/22 0426  Weight: 103.9 kg 101.6 kg 102.1 kg    9: C1 right transverse process fracture--he's out of collar 10: Left Occipital condyle fracture--out of collar 11: Acute ventilator respiratory failure requiring trach and PEG 3/29              --decannulated , stoma closed             --  changed Robitussin to 25 mL qid  -continue  robinul via PEG to assist with cough    12: Right ear laceration:  healing 14: Tongue laceration: monitor healing 15: Hypertension: lopressor 50mg  bid      -hr controlled. Decrease to 25mg  bid and increase propranolol to 20mg     05/28/2022    4:06 AM 05/27/2022    7:37 PM 05/27/2022    1:10 PM  Vitals with BMI  Systolic 111 120 [id number]  Diastolic 72 81 83  Pulse 74 84 87    16. Hypoglycemic to 69: otherwise in 90s: will d/c ISS since not requiring   18. Iron deficiency:  continue daily iron supplement  19. Tremor,?clonus: continue trial of propranolol 10mg  tid  Increase propranolol to 20 mg tid     -see above  LOS: 20 days A FACE TO FACE EVALUATION WAS PERFORMED  Ranelle OysterZachary T Sephora Boyar 05/28/2022, 10:46 AM

## 2022-05-28 NOTE — Progress Notes (Signed)
Occupational Therapy Session Note  Patient Details  Name: Carl Carpenter MRN: [id number] Date of Birth: [birth year]  Today's Date: 05/28/2022 OT Individual Time: 3559-7416 OT Individual Time Calculation (min): 60 min    Short Term Goals: Week 3:  OT Short Term Goal 1 (Week 3): Caregivers will demonstrate competency with UE PROM OT Short Term Goal 2 (Week 3): Pt will initiate 1 step of UB dressing with max cueing OT Short Term Goal 3 (Week 3): Pt will maintain sitting balance statically with max A  Skilled Therapeutic Interventions/Progress Updates:    1:1 Pt received in bed and with eyes closed; after multiples ways to arouse him he opened his eyes and followed OT's face. Total A +2 to come to EOB and with facilitation pt able to decr pushing with left Ue and able to sit EOB with contact guard to min A at EOB. Pt performed sit to stand with mod A +2 in the STEDY to transfer into the tilt in space shower chair. Pt with difficulty going from standing to sitting on perch with pushing into extension through left LE. (Shower already running an bathroom warm upon entry.). Pt leaning face and head into water -indicating enjoyment but despite multiple opportunities to voice no sound. Pt did assist with washing eyes in shower but with difficulty transitioning to another body part with multimodal cues. Pt with leaning to the right and forward in shower requiring min A to frequent reposition self. At the end of shower pt still hold on to wet washcloth- began to wash left Le and attempted foot (while drying him off)- very delayed response to carrying out attempts to wash. Pt donned shirt with total A but with backwards chaining able to pull head through head hole. Pt taken out of bathroom and transitioned to the EOB with stedy again with mod A +2. Brief donned total A +2 in standing. Pt initiated attempts to help in threading pants with forward weight shift and to try ot pull the waist band up to thighs but still  required two helpers for dyanmic sitting balance during task. Pt stood with +2 to assist with pulling up pants. Pt returned to supine in bed with ability to lay down on pillow and pull left LE up into bed but required A for right side. Left resting in bed with [name].   Therapy Documentation Precautions:  Precautions Precautions: Fall Precaution Comments: PEG with binder Restrictions Weight Bearing Restrictions: No  Pain:  No indications of pain  Therapy/Group: Individual Therapy  Roney Mans [facility] 05/28/2022, 2:40 PM

## 2022-05-29 LAB — GLUCOSE, CAPILLARY
Glucose-Capillary: 100 mg/dL — ABNORMAL HIGH (ref 70–99)
Glucose-Capillary: 102 mg/dL — ABNORMAL HIGH (ref 70–99)
Glucose-Capillary: 106 mg/dL — ABNORMAL HIGH (ref 70–99)
Glucose-Capillary: 106 mg/dL — ABNORMAL HIGH (ref 70–99)
Glucose-Capillary: 118 mg/dL — ABNORMAL HIGH (ref 70–99)
Glucose-Capillary: 123 mg/dL — ABNORMAL HIGH (ref 70–99)
Glucose-Capillary: 98 mg/dL (ref 70–99)

## 2022-05-29 MED ORDER — METOPROLOL TARTRATE 25 MG/10 ML ORAL SUSPENSION
12.5000 mg | Freq: Two times a day (BID) | ORAL | Status: DC
  Administered 2022-05-29 – 2022-05-30 (×2): 12.5 mg
  Filled 2022-05-29 (×2): qty 5

## 2022-05-29 NOTE — Progress Notes (Signed)
Occupational Therapy Session Note  Patient Details  Name: Carl Carpenter MRN: [id number] Date of Birth: February 16, 1986  Today's Date: 05/29/2022 OT Individual Time: 1050-1200 OT Individual Time Calculation (min): 70 min    Short Term Goals: Week 3:  OT Short Term Goal 1 (Week 3): Caregivers will demonstrate competency with UE PROM OT Short Term Goal 2 (Week 3): Pt will initiate 1 step of UB dressing with max cueing OT Short Term Goal 3 (Week 3): Pt will maintain sitting balance statically with max A  Skilled Therapeutic Interventions/Progress Updates:    Pt received sitting up in the TIS w/c with grimacing of the face and pulling at his shorts. He was sweaty and pale. He was found to be saturated in urine. He stood in the stedy with max +2 assist to initiate and then once he stood he was able to maintain BLE extension (looked like tone vs volitional) while OT initiated peri hygiene. He began having a BM and remained in a squat position with min A. He required total A for donning of new pants and hygiene. Pt was taken via stedy in the perched position to the therapy gym. Worked on sitting balance EOM- pt fluctuating with assist levels, as little as CGA at times and then requiring max A. Worked on functional reaching to reinforce following a pattern- both with placing a ball into a bin and placing clothespins onto a basketball hoop. He was much more successful in following a pattern this session and was able to carryover 5x repetitions with facilitation for termination only. Pt returned to his room via stedy and was transferred back to bed for rest. He was left supine with his father present.   Therapy Documentation Precautions:  Precautions Precautions: Fall Precaution Comments: PEG with binder Restrictions Weight Bearing Restrictions: No  Therapy/Group: Individual Therapy  Crissie Reese 05/29/2022, 6:42 AM

## 2022-05-29 NOTE — Progress Notes (Signed)
Physical Therapy TBI Note  Patient Details  Name: Carl Carpenter MRN: [id number] Date of Birth: July 25, 1986  Today's Date: 05/29/2022 PT Individual Time: 3254-9826 PT Individual Time Calculation (min): 57 min   Short Term Goals: Week 4:  PT Short Term Goal 1 (Week 4): Pt will attend to therapy task for 5 minutes with max cues. PT Short Term Goal 2 (Week 4): Pt will completes bed to chair transfer with max/totalA +1. PT Short Term Goal 3 (Week 4): Patient with progress therapeutic gait >50 ft with max A +2 for increased activity tolerance and motor planning  Skilled Therapeutic Interventions/Progress Updates:     Pt received supine in bed and in no apparent pain. Pt performing pericare and brief change with RN staff and wife. PT provides assistance donning clean pants and pt requires maxA for bilateral rolling.  Supine to sit with totalA +1 and cues for hand placement, sequencing, and positioning. Pt performs squat pivot transfer to [facility] with totalA +2 and cues for anterior trunk lean, hand placement, initiation, and sequencing. WC transport to gym for time management. Session focused on standing balance and activity tolerance, with pt performing multiple reps of sit to stand with mod/maxA +2 and cues for anterior weight shift, hand placement, and initiation. In standing, pt requires anywhere from modA +1 to maxA +2 for balance, with tendency to push with L leg and lean laterally to the R. PT provides tactile cueing for neutral hip and pelvis positioning, hip extension, R knee extension, foot placement, upright gaze to improve posture and balance, and neutral trunk. Pt cued to reach for clothespins but is unable to follow command in addition to task of standing. In sitting pt reach for clothespins. Pt requires modA/maxA+2 each time he transitions from standing to sitting with tendency to keep both legs in extension. WC transport back to room. Left seated with alarm intact and all needs within  reach.  Therapy Documentation Precautions:  Precautions Precautions: Fall Precaution Comments: PEG with binder Restrictions Weight Bearing Restrictions: No  Therapy/Group: Individual Therapy  [name], PT, DPT 05/29/2022, 6:10 PM

## 2022-05-29 NOTE — Progress Notes (Addendum)
Patient ID: Carl Carpenter, male   DOB: June 03, 1986, 36 y.o.   MRN: [id number]  SW informed pt [facility] medicaid is active beginning 05/21/22. SW left message for pt assigned Medicaid CM- [facility] DSS [phone]) to discuss coverage. SW waiting on follow-up.   Insurance reviewer indicates- Case opened on 6/9 for retro auth request to 6/1 (policy effective date) through [employer] portal. Case pended for review under case number [id number].   Benefits: Borden, Thune- DOB [birth year] Insurance: [facility] Medicaid Plan Type: Adult Medicaid group: [facility] Policy #: [id number] R Fax: [phone] Phone: Online - [email or web address] Eff Date: 05/21/2022 - 06/19/2022 Deductible: $0 (does not have deductible) OOP Max: $0  CIR: 100% coverage SNF: 100% coverage, first 90 days covered by [facility] Medicaid then members move to [facility]  Outpatient: 100% coverage 27 visits per calendar year across all therapy disciplines combined Home Health:  100% coverage Home Health Aides services must be limited to 100 total visits per year per member DME: 100% coverage, limited by medical necessity   Cecile Sheerer, MSW, LCSWA Office: [phone] Cell: [phone] Fax: [phone]

## 2022-05-29 NOTE — Progress Notes (Signed)
Speech Language Pathology Weekly Progress and Session Note  Patient Details  Name: Carl Carpenter MRN: [id number] Date of Birth: 1986-04-25  Beginning of progress report period: May 22, 2022 End of progress report period: May 29, 2022  Today's Date: 05/29/2022 SLP Individual Time: 0915-1010 SLP Individual Time Calculation (min): 55 min  Short Term Goals: Week 3: SLP Short Term Goal 1 (Week 3): Pt will consume therapeutic trials of 1/2 teaspoons of thin liquids and ice chips with minimal overt s/s of aspiration over 3 consecutive sessions and max assist use of swallowing precautions prior to MBS or FEES. SLP Short Term Goal 1 - Progress (Week 3): Not met SLP Short Term Goal 2 (Week 3): Pt will vocalize on command in 25% of opportunities with max assist multimodal cues over 2 sessions. SLP Short Term Goal 2 - Progress (Week 3): Not met SLP Short Term Goal 3 (Week 3): Pt will answer basic yes/no questions using written choices on a white board and either eye gaze or gesturing in 50% of opportunities with max assist multimodal cues over 2 sessions. SLP Short Term Goal 3 - Progress (Week 3): Not met SLP Short Term Goal 4 (Week 3): Pt will follow 1 step  commands in  50% of opportunities with max assist multimodal cues over 2 sessions. SLP Short Term Goal 4 - Progress (Week 3): Not met SLP Short Term Goal 5 (Week 3): Pt will focus his attention on a targeted stimulus for 60 seconds with max assist multimodal cues over 2 sessions. SLP Short Term Goal 5 - Progress (Week 3): Met    New Short Term Goals: Week 4: SLP Short Term Goal 1 (Week 4): Pt will consume therapeutic trials of 1/2 teaspoons of thin liquids and ice chips with minimal overt s/s of aspiration over 3 consecutive sessions and max assist use of swallowing precautions prior to MBS or FEES. SLP Short Term Goal 2 (Week 4): Pt will vocalize on command in 25% of opportunities with max assist multimodal cues over 2 sessions. SLP Short Term  Goal 3 (Week 4): Pt will answer basic yes/no questions using written choices on a white board, gestures, or eye gaze in 50% of opportunities with max assist multimodal cues over 2 sessions. SLP Short Term Goal 4 (Week 4): Pt will follow 1 step  commands in  50% of opportunities with max assist multimodal cues over 2 sessions. SLP Short Term Goal 5 (Week 4): Pt will demonstre sustained attention on a targeted stimulus for 2 minutes with max assist multimodal cues over 2 sessions.  Weekly Progress Updates: Patient has made minimal gains this week and has met 1 of 5 STGs this reporting period. Currently, patient remains NPO with Max-Total A multimodal cues needed for patient to open/close his oral cavity around a spoon to accept a bolus. Patient has been consuming trials of small ice chips with most trials spilling anteriorly from his oral cavity. Patient also with prolonged AP transit and significant delay in swallow initiation resulting in consistent overt s/s of aspiration. Recommend patient remain NPO. Patient remains nonverbal and requires Max-Total A for use of multimodal communication (gestures, yes/no boards) to express wants/needs. Patient also unable to phonate on command despite Max A multimodal cues. Patient demonstrates improved focused attention for ~60 seconds to stimulus/task with inconsistent ability to track and follows commands in ~25% of opportunities with Max A multimodal cues. Patient's overall function can fluctuate  throughout the day depending on fatigue. Patient and family education ongoing.  Patient would benefit from continued skilled SLP intervention to maximize his cognitive-linguistic and swallowing function prior to discharge to reduce caregiver burden.      Intensity: Minumum of 1-2 x/day, 30 to 90 minutes Frequency: 3 to 5 out of 7 days Duration/Length of Stay: 06/16/22 Treatment/Interventions: Cognitive remediation/compensation;Cueing hierarchy;Dysphagia/aspiration precaution  training;Environmental controls;Internal/external aids;Speech/Language facilitation;Patient/family education;Functional tasks;Multimodal communication approach;Therapeutic Activities   Daily Session  Skilled Therapeutic Interventions:   Skilled treatment session focused on cognitive goals. Upon arrival, patient was awake in bed. SLP facilitated session by transferring the patient to the wheelchair via the [facility] with +3 assist for safety. Patient required Max-Total A hand over hand assist for initiation during transfer. SLP also facilitated session by providing extra time and max-total A for initiation of basic self-care tasks at the sink like brushing his teeth (dry), brushing his hair/beard, and washing his face. Patient then was transitioned the dayroom with focus on following commands and terminating tasks/grasp. Patient able to "catch" the ball once thrown with Mod-Max A cues and then was able to place it into a hoop with Max verbal cues X 3. Throughout task, oral manipulation of musculature noted with what appeared to be patient attempting to communicate. Despite Max A multimodal cues, patient unable to phonate on command. Patient left upright in wheelchair with alarm on and all needs within reach. Continue with current plan of care.      Pain No/Denies Pain   Therapy/Group: Individual Therapy  Dandrae Kustra 05/29/2022, 6:54 AM

## 2022-05-29 NOTE — Progress Notes (Signed)
PROGRESS NOTE   Subjective/Complaints: Quiet night. No new issues this morning. Dad at bedside.   ROS: Limited due to cognitive/behavioral   Objective:   No results found. No results for input(s): "WBC", "HGB", "HCT", "PLT" in the last 72 hours.    No results for input(s): "NA", "K", "CL", "CO2", "GLUCOSE", "BUN", "CREATININE", "CALCIUM" in the last 72 hours.     Intake/Output Summary (Last 24 hours) at 05/29/2022 1050 Last data filed at 05/29/2022 0523 Gross per 24 hour  Intake --  Output 800 ml  Net -800 ml           Physical Exam: Vital Signs Blood pressure 122/70, pulse 76, temperature 97.8 F (36.6 C), temperature source Oral, resp. rate 18, height 6\' 5"  (1.956 m), weight 100.2 kg, SpO2 96 %. Constitutional: No distress . Vital signs reviewed. HEENT: NCAT, EOMI, oral membranes moist Neck: supple Cardiovascular: RRR without murmur. No JVD    Respiratory/Chest: CTA Bilaterally without wheezes or rales. Normal effort    GI/Abdomen: BS +, non-tender, non-distended, some irritation around peg site Ext: no clubbing, cyanosis, or edema Psych: pleasant and cooperative  Musculoskeletal:        no pain with ROM  Skin:    Comments: left knee abrasion with scar Neurological:     Comments: alert, variable eye contact. Does seem to react to conversations. Almost seems to attempt to mouth words.    DTR's brisk on right.   Possible mild flexor tone in right hand. Engages with RUE and RLE inconsistently.     Mild tremor  Assessment/Plan: 1. Functional deficits which require 3+ hours per day of interdisciplinary therapy in a comprehensive inpatient rehab setting. Physiatrist is providing close team supervision and 24 hour management of active medical problems listed below. Physiatrist and rehab team continue to assess barriers to discharge/monitor patient progress toward functional and medical goals  Care  Tool:  Bathing    Body parts bathed by patient: Right arm, Left arm, Chest, Abdomen, Front perineal area, Buttocks, Right upper leg, Left upper leg, Right lower leg, Left lower leg   Body parts bathed by helper: Right arm, Right lower leg, Left lower leg, Chest, Left arm, Front perineal area, Buttocks, Right upper leg, Left upper leg, Abdomen, Face     Bathing assist Assist Level: 2 Helpers     Upper Body Dressing/Undressing Upper body dressing   What is the patient wearing?: Pull over shirt    Upper body assist Assist Level: Total Assistance - Patient < 25%    Lower Body Dressing/Undressing Lower body dressing      What is the patient wearing?: Underwear/pull up, Pants     Lower body assist Assist for lower body dressing: 2 Helpers     Toileting Toileting    Toileting assist Assist for toileting: 2 Helpers     Transfers Chair/bed transfer  Transfers assist  Chair/bed transfer activity did not occur: Safety/medical concerns  Chair/bed transfer assist level: 2 Helpers     Locomotion Ambulation   Ambulation assist   Ambulation activity did not occur: Safety/medical concerns          Walk 10 feet activity   Assist  Walk 10 feet activity did not occur: Safety/medical concerns        Walk 50 feet activity   Assist Walk 50 feet with 2 turns activity did not occur: Safety/medical concerns         Walk 150 feet activity   Assist Walk 150 feet activity did not occur: Safety/medical concerns         Walk 10 feet on uneven surface  activity   Assist Walk 10 feet on uneven surfaces activity did not occur: Safety/medical concerns         Wheelchair     Assist Is the patient using a wheelchair?: Yes Type of Wheelchair: Manual    Wheelchair assist level: Dependent - Patient 0%      Wheelchair 50 feet with 2 turns activity    Assist        Assist Level: Dependent - Patient 0%   Wheelchair 150 feet activity      Assist      Assist Level: Dependent - Patient 0%   Blood pressure 122/70, pulse 76, temperature 97.8 F (36.6 C), temperature source Oral, resp. rate 18, height 6\' 5"  (1.956 m), weight 100.2 kg, SpO2 96 %.    Medical Problem List and Plan: 1. 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  DVT right posterior tibial and peroneal veins (03/22/22): Discussed with mother that repeat doppler shows no new clots and resolution of one previous clot.  -anticoagulation: continue Eliquis             -antiplatelet therapy: none 3. Pain: continueTylenol prn 4. Mood: LCSW to evaluate and provide emotional support             -antipsychotic agents: none 5. Impaired attention: This patient is not capable of making decisions on his own behalf.             -continue amantadine 200mg  bid             - ritalin  25mg  bid -continue               6. Dysphagia. S/p PEG 3/29.  --NPO -continue TF/boluses -Continue zinc oxide to protect skin surrounding PEG tube -5/30 replaced PEG under fluoro. Appreciate IR help -PEG sit  clean, some irritation 7. Fluids/Electrolytes/Nutrition: Routine Is and Os and follow-up chemistries             --continue tube feeds Jevity 1.5 boluses             --continue Prosource             - prealbumin 35.8, gaining weight Filed Weights   05/26/22 0519 05/27/22 0426 05/29/22 0500  Weight: 101.6 kg 102.1 kg 100.2 kg    9: C1 right transverse process fracture--he's out of collar 10: Left Occipital condyle fracture--out of collar 11: Acute ventilator respiratory failure requiring trach and PEG  3/29              --  decannulated , stoma closed             --changed Robitussin to 25 mL qid  -continue robinul via PEG to assist with cough    12: Right ear laceration:  healing 14: Tongue laceration: monitor healing 15: Hypertension: lopressor 50mg  bid      -hr controlled. Decrease to 12.5mg  bid and maintain propranolol at 20mg     05/29/2022    5:00 AM 05/29/2022    4:11 AM 05/28/2022    7:53 PM  Vitals with BMI  Weight 221 lbs    BMI 26.2    Systolic  122 109  Diastolic  70 94  Pulse  76 94    16. Hypoglycemic to 69: otherwise in 90s: will d/c ISS since not requiring   18. Iron deficiency:  continue daily iron supplement  19. Tremor,?clonus: continue trial of propranolol 10mg  tid  Increased propranolol to 20 mg tid     -see above  LOS: 21 days A FACE TO FACE EVALUATION WAS PERFORMED  Ranelle OysterZachary T Evi Mccomb 05/29/2022, 10:50 AM

## 2022-05-30 LAB — GLUCOSE, CAPILLARY
Glucose-Capillary: 82 mg/dL (ref 70–99)
Glucose-Capillary: 99 mg/dL (ref 70–99)
Glucose-Capillary: 113 mg/dL — ABNORMAL HIGH (ref 70–99)
Glucose-Capillary: 106 mg/dL — ABNORMAL HIGH (ref 70–99)
Glucose-Capillary: 108 mg/dL — ABNORMAL HIGH (ref 70–99)

## 2022-05-30 MED ORDER — PROPRANOLOL HCL 20 MG/5ML PO SOLN
30.0000 mg | Freq: Three times a day (TID) | ORAL | Status: DC
  Administered 2022-05-30 – 2022-06-14 (×46): 30 mg
  Filled 2022-05-30 (×48): qty 7.5

## 2022-05-30 NOTE — Progress Notes (Signed)
Speech Language Pathology TBI Note  Patient Details  Name: Carl Carpenter MRN: [id number] Date of Birth: Sep 01, 1986  Today's Date: 05/30/2022 SLP Individual Time: 1400-1455 SLP Individual Time Calculation (min): 55 min  Short Term Goals: Week 4: SLP Short Term Goal 1 (Week 4): Pt will consume therapeutic trials of 1/2 teaspoons of thin liquids and ice chips with minimal overt s/s of aspiration over 3 consecutive sessions and max assist use of swallowing precautions prior to MBS or FEES. SLP Short Term Goal 2 (Week 4): Pt will vocalize on command in 25% of opportunities with max assist multimodal cues over 2 sessions. SLP Short Term Goal 3 (Week 4): Pt will answer basic yes/no questions using written choices on a white board, gestures, or eye gaze in 50% of opportunities with max assist multimodal cues over 2 sessions. SLP Short Term Goal 4 (Week 4): Pt will follow 1 step  commands in  50% of opportunities with max assist multimodal cues over 2 sessions. SLP Short Term Goal 5 (Week 4): Pt will demonstre sustained attention on a targeted stimulus for 2 minutes with max assist multimodal cues over 2 sessions.  Skilled Therapeutic Interventions: Skilled treatment session focused on cognitive and dysphagia goals. Upon arrival, patient was awake while upright in the wheelchair. Patient appeared more restless today and was moving all four limbs spontaneously. Increased movement of the patient's right facial muscles were also noted and it appeared that patient smiled X 1 in response to humor. SLP provided Max-Total hand over hand assist for patient to engage in oral care via the suction toothbrush. Patient able to open and close his oral cavity around a spoon with overall Mod verbal and tactile cues. Patient with increased oral manipulation of ice but minimal swallow response noted. SLP provided pictures and videos of the patient and his son to help facilitate an emotional response. Patient attended to the  video for ~60 seconds but no response noted. SLP also provided Max A multimodal cues for purposeful phonation without success. Patient had been incontinent of urine and was transferred back to bed via the [facility]. Patient required extra time and Max verbal and tactile cues to follow commands during bed mobility. Patient left supine in bed with family and nurse present. Continue with current plan of care.      Pain No indications of pain   Agitated Behavior Scale: TBI No unsafe behaviors but increased restlessness noted     Therapy/Group: Individual Therapy  Twanda Stakes 05/30/2022, 3:00 PM

## 2022-05-30 NOTE — Progress Notes (Signed)
PROGRESS NOTE   Subjective/Complaints: Girlfriend said he's been a bit more restless. Attempting to assist with some basic tasks like combing hair.   ROS: Limited due to cognitive/behavioral   Objective:   No results found. No results for input(s): "WBC", "HGB", "HCT", "PLT" in the last 72 hours.    No results for input(s): "NA", "K", "CL", "CO2", "GLUCOSE", "BUN", "CREATININE", "CALCIUM" in the last 72 hours.     Intake/Output Summary (Last 24 hours) at 05/30/2022 1112 Last data filed at 05/29/2022 2318 Gross per 24 hour  Intake --  Output 400 ml  Net -400 ml           Physical Exam: Vital Signs Blood pressure 128/86, pulse 83, temperature 98.3 F (36.8 C), temperature source Oral, resp. rate 16, height 6\' 5"  (1.956 m), weight 99.8 kg, SpO2 96 %. Constitutional: No distress . Vital signs reviewed. HEENT: NCAT, EOMI, oral membranes moist Neck: supple Cardiovascular: RRR without murmur. No JVD    Respiratory/Chest: CTA Bilaterally without wheezes or rales. Normal effort    GI/Abdomen: BS +, non-tender, non-distended Ext: no clubbing, cyanosis, or edema Psych: flat, a little fidgety  Musculoskeletal:        no pain with ROM  Skin:    Comments: left knee abrasion with scar Neurological:     Comments: alert, eye contact. Does seem to respond to convo. I held his bottom lip and he pushed out tongue when I asked him to. Almost seems to attempt to mouth words.    DTR's brisk on right.   Possible mild flexor tone in right hand. Engages with RUE and RLE inconsistently.     Mild tremor  Assessment/Plan: 1. Functional deficits which require 3+ hours per day of interdisciplinary therapy in a comprehensive inpatient rehab setting. Physiatrist is providing close team supervision and 24 hour management of active medical problems listed below. Physiatrist and rehab team continue to assess barriers to discharge/monitor  patient progress toward functional and medical goals  Care Tool:  Bathing    Body parts bathed by patient: Right arm, Left arm, Chest, Abdomen, Front perineal area, Buttocks, Right upper leg, Left upper leg, Right lower leg, Left lower leg   Body parts bathed by helper: Right arm, Right lower leg, Left lower leg, Chest, Left arm, Front perineal area, Buttocks, Right upper leg, Left upper leg, Abdomen, Face     Bathing assist Assist Level: 2 Helpers     Upper Body Dressing/Undressing Upper body dressing   What is the patient wearing?: Pull over shirt    Upper body assist Assist Level: Total Assistance - Patient < 25%    Lower Body Dressing/Undressing Lower body dressing      What is the patient wearing?: Underwear/pull up, Pants     Lower body assist Assist for lower body dressing: 2 Helpers     Toileting Toileting    Toileting assist Assist for toileting: 2 Helpers     Transfers Chair/bed transfer  Transfers assist  Chair/bed transfer activity did not occur: Safety/medical concerns  Chair/bed transfer assist level: 2 Helpers     Locomotion Ambulation   Ambulation assist   Ambulation activity did not occur: Safety/medical  concerns          Walk 10 feet activity   Assist  Walk 10 feet activity did not occur: Safety/medical concerns        Walk 50 feet activity   Assist Walk 50 feet with 2 turns activity did not occur: Safety/medical concerns         Walk 150 feet activity   Assist Walk 150 feet activity did not occur: Safety/medical concerns         Walk 10 feet on uneven surface  activity   Assist Walk 10 feet on uneven surfaces activity did not occur: Safety/medical concerns         Wheelchair     Assist Is the patient using a wheelchair?: Yes Type of Wheelchair: Manual    Wheelchair assist level: Dependent - Patient 0%      Wheelchair 50 feet with 2 turns activity    Assist        Assist Level:  Dependent - Patient 0%   Wheelchair 150 feet activity     Assist      Assist Level: Dependent - Patient 0%   Blood pressure 128/86, pulse 83, temperature 98.3 F (36.8 C), temperature source Oral, resp. rate 16, height 6\' 5"  (1.956 m), weight 99.8 kg, SpO2 96 %.    Medical Problem List and Plan: 1. 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  DVT right posterior tibial and peroneal veins (03/22/22): Discussed with mother that repeat doppler shows no new clots and resolution of one previous clot.  -anticoagulation: continue Eliquis             -antiplatelet therapy: none 3. Pain: continueTylenol prn 4. Mood: LCSW to evaluate and provide emotional support             -antipsychotic agents: none  -increasing propranolol for restlessness 5. Impaired attention: This patient is not capable of making decisions on his own behalf.             -continue amantadine 200mg  bid             - ritalin  25mg  bid -continue               6. Dysphagia. S/p PEG 3/29.  --NPO -continue TF/boluses -Continue zinc oxide to protect skin surrounding PEG tube -5/30 replaced PEG under fluoro. Appreciate IR help -PEG site remains clean, some irritation 7. Fluids/Electrolytes/Nutrition: Routine Is and Os and follow-up chemistries             --continue tube feeds Jevity 1.5 boluses             --continue Prosource             - prealbumin 35.8, gaining weight Filed Weights   05/27/22 0426 05/29/22 0500 05/30/22 0500  Weight: 102.1 kg 100.2 kg 99.8 kg    9: C1 right transverse process fracture--he's out of  collar 10:  Left Occipital condyle fracture--out of collar 11: Acute ventilator respiratory failure requiring trach and PEG 3/29              --decannulated , stoma closed             --changed Robitussin to 25 mL qid  -continue robinul via PEG to assist with cough    12: Right ear laceration:  healing 14: Tongue laceration: monitor healing 15: Hypertension: lopressor 50mg  bid      -hr controlled. Dc metoprolol and increase propranolol to 30mg  tid    05/30/2022    5:00 AM 05/30/2022    3:47 AM 05/29/2022    7:34 PM  Vitals with BMI  Weight 220 lbs    BMI 26.08    Systolic  128 126  Diastolic  86 83  Pulse  83 90    16. Hypoglycemic to 69: otherwise in 90s: will d/c ISS since not requiring   18. Iron deficiency:  continue daily iron supplement  19. Tremor,?clonus: continue trial of propranolol 10mg  tid  Increase propranolol to 30 mg tid     -see above  LOS: 22 days A FACE TO FACE EVALUATION WAS PERFORMED  07/30/2022 05/30/2022, 11:12 AM

## 2022-05-31 LAB — GLUCOSE, CAPILLARY
Glucose-Capillary: 109 mg/dL — ABNORMAL HIGH (ref 70–99)
Glucose-Capillary: 109 mg/dL — ABNORMAL HIGH (ref 70–99)
Glucose-Capillary: 113 mg/dL — ABNORMAL HIGH (ref 70–99)
Glucose-Capillary: 121 mg/dL — ABNORMAL HIGH (ref 70–99)
Glucose-Capillary: 125 mg/dL — ABNORMAL HIGH (ref 70–99)

## 2022-05-31 MED ORDER — QUETIAPINE FUMARATE 25 MG PO TABS
25.0000 mg | ORAL_TABLET | Freq: Every day | ORAL | Status: DC
  Administered 2022-05-31 – 2022-06-04 (×5): 25 mg via ORAL
  Filled 2022-05-31 (×5): qty 1

## 2022-05-31 NOTE — Progress Notes (Signed)
Physical Therapy TBI Note  Patient Details  Name: Carl Carpenter MRN: [id number] Date of Birth: 21-Aug-1986  Today's Date: 05/31/2022 PT Individual Time: 1445-1530 PT Individual Time Calculation (min): 45 min   Short Term Goals: Week 4:  PT Short Term Goal 1 (Week 4): Pt will attend to therapy task for 5 minutes with max cues. PT Short Term Goal 2 (Week 4): Pt will completes bed to chair transfer with max/totalA +1. PT Short Term Goal 3 (Week 4): Patient with progress therapeutic gait >50 ft with max A +2 for increased activity tolerance and motor planning  Skilled Therapeutic Interventions/Progress Updates:     Initiated PT session due to patient's parent's request and PT time available. [name] concerned that the patient had not had therapy this weekend. Explained therapy priorities for scheduling on the weekend. [name] were very appreciative of additional session today.   Patient in bed asleep with his parents at bedside upon PT arrival. Patient difficult to arouse in lying initially, improved once patient's bed moved and patient was assisted to sitting. Patient without signs of pain or discomfort throughout session.   Utilized nodding yes appropriately x2 during session, other trials unsuccessful or unclear. Noted improved use of R hemi-body this session leading to reduced assist levels with mobility and gait. Patient's parents assisted with patient attention, motivation, and w/c follow throughout session. Provided education on over stimulation and how to reduce cues and provide time for delayed initiation from patient.   [name] reported assisting patient to EOB earlier today due to patient demonstrating restless behaviors while throwing his legs off the bed. Educated on need for staff assist for this at this time, assessed supine to sit with his mother assisting with patient sliding forward on the air mattress. Educated on continued need for staff assist for all  mobility for patient's safety. Discussed switching the bed from an air mattress, as patient no longer with skin breakdown, confirmed with LPN. [name] reluctant due to increased length of bed and concerns for additional skin breakdown without air mattress.   Therapeutic Activity: Bed Mobility: Patient performed supine to sit with mod-max A with his mother assisting, patient unable to assist with bringing his trunk forward and began to slide his hips toward the EOB on the air mattress. PT assisted by blocking patient's knees to prevent sliding and returning patient's trunk to lying. Then assisted patient with mod progressing to min A to sitting EOB. Provided verbal cues for  initiation and sequencing to use B upper extremities while pushing up to sitting. Transfers: Patient performed stand pivot bed>TIS w/c with mod-max A of 1 person with a second person SBA. Patient demonstrated stepping with L foot while pivoting on his R to turn to sit. Patient did start descent into the chair before completing the turn needing assist to place hips safely in the chair. He performed sit to/from stand x2 with mod A +2 HHA. Provided multimodal cues for initiation, forward weight shift, and gluteal activation for hip/trunk extension.  Neuromuscular Re-ed: Therapeutic Gait Training: Patient ambulated 60 feet with 1x180 deg turn and 77 feet with 1x90 deg turn with mod A +2. Patient with notable improvement in trunk control this session. Performed L limb advancement independently 100% of the time and R limb advancement with 25-50% assist for limb advancement and notable initiation 100% of the time. Much improved from last week when this therapist saw the patient. Utilized R leg loops to assist R limb advancement throughout with +  2 w/c follow from patient's father for safety.  Trunk control and R upper extremity motor control activity: Facilitated patient to reach for a pen x2 focused on patient initiating R reach with  min A and forward trunk lean with min A with mod-max cues and time for initiation. Patient able to progress to holding a paper in his L and drawing a line with his R hand, then lean forward with paper on the table and attempt to write his name, then the letter J without success, but placing the pen on the paper with good pencil gip and making lines  Patient in TIS w/c with his parents in the room and set belt fastened at end of session with breaks locked and all needs within reach.   Returned after 30 min to assist LPN using St[place] to return patient back to bed dependently with min A +2 for standing in the stedy and mod-min A of 1 for R lower extremity management going from sit to supine. Recommend working toward [place] transfers with nursing staff this week, continue with maxi-move at this time for safety, LPN in agreement.  Therapy Documentation Precautions:  Precautions Precautions: Fall Precaution Comments: PEG with binder Restrictions Weight Bearing Restrictions: No  Agitated Behavior Scale: TBI Observation Details Observation Environment: CIR Start of observation period - Date: 05/31/22 Start of observation period - Time: 1445 End of observation period - Date: 05/31/22 End of observation period - Time: 1530 Agitated Behavior Scale (DO NOT LEAVE BLANKS) Short attention span, easy distractibility, inability to concentrate: Present to a slight degree Impulsive, impatient, low tolerance for pain or frustration: Absent Uncooperative, resistant to care, demanding: Absent Violent and/or threatening violence toward people or property: Absent Explosive and/or unpredictable anger: Absent Rocking, rubbing, moaning, or other self-stimulating behavior: Absent Pulling at tubes, restraints, etc.: Absent Wandering from treatment areas: Absent Restlessness, pacing, excessive movement: Present to a slight degree Repetitive behaviors, motor, and/or verbal: Absent Rapid, loud, or excessive talking:  Absent Sudden changes of mood: Absent Easily initiated or excessive crying and/or laughter: Absent Self-abusiveness, physical and/or verbal: Absent Agitated behavior scale total score: 16    Therapy/Group: Individual Therapy  Kesi Perrow L Kiyona Mcnall PT, DPT  05/31/2022, 6:50 PM

## 2022-05-31 NOTE — Progress Notes (Signed)
PROGRESS NOTE   Subjective/Complaints: More restless at night. Didn't fall asleep until 0200 per mom. A little more restless and perseverative in general.   ROS: Limited due to cognitive/behavioral   Objective:   No results found. No results for input(s): "WBC", "HGB", "HCT", "PLT" in the last 72 hours.    No results for input(s): "NA", "K", "CL", "CO2", "GLUCOSE", "BUN", "CREATININE", "CALCIUM" in the last 72 hours.     Intake/Output Summary (Last 24 hours) at 05/31/2022 0958 Last data filed at 05/30/2022 2100 Gross per 24 hour  Intake --  Output 600 ml  Net -600 ml           Physical Exam: Vital Signs Blood pressure 127/78, pulse 79, temperature 98.5 F (36.9 C), resp. rate 18, height 6\' 5"  (1.956 m), weight 100.2 kg, SpO2 96 %. Constitutional: No distress . Vital signs reviewed. HEENT: NCAT, EOMI, oral membranes moist Neck: supple Cardiovascular: RRR without murmur. No JVD    Respiratory/Chest: CTA Bilaterally without wheezes or rales. Normal effort    GI/Abdomen: BS +, non-tender, non-distended, PEG clean Ext: no clubbing, cyanosis, or edema Psych: internally distracted Musculoskeletal:        no pain with ROM  Skin:    Comments: left knee abrasion with scar Neurological:     Comments: focusing on movements of hand. Less eye contact today.    DTR's brisk on right.   Possible mild flexor tone in right hand. Engages with RUE and RLE inconsistently.     Mild tremor  Assessment/Plan: 1. Functional deficits which require 3+ hours per day of interdisciplinary therapy in a comprehensive inpatient rehab setting. Physiatrist is providing close team supervision and 24 hour management of active medical problems listed below. Physiatrist and rehab team continue to assess barriers to discharge/monitor patient progress toward functional and medical goals  Care Tool:  Bathing    Body parts bathed by patient:  Right arm, Left arm, Chest, Abdomen, Front perineal area, Buttocks, Right upper leg, Left upper leg, Right lower leg, Left lower leg   Body parts bathed by helper: Right arm, Right lower leg, Left lower leg, Chest, Left arm, Front perineal area, Buttocks, Right upper leg, Left upper leg, Abdomen, Face     Bathing assist Assist Level: 2 Helpers     Upper Body Dressing/Undressing Upper body dressing   What is the patient wearing?: Pull over shirt    Upper body assist Assist Level: Total Assistance - Patient < 25%    Lower Body Dressing/Undressing Lower body dressing      What is the patient wearing?: Underwear/pull up, Pants     Lower body assist Assist for lower body dressing: 2 Helpers     Toileting Toileting    Toileting assist Assist for toileting: 2 Helpers     Transfers Chair/bed transfer  Transfers assist  Chair/bed transfer activity did not occur: Safety/medical concerns  Chair/bed transfer assist level: 2 Helpers     Locomotion Ambulation   Ambulation assist   Ambulation activity did not occur: Safety/medical concerns          Walk 10 feet activity   Assist  Walk 10 feet activity did not occur:  Safety/medical concerns        Walk 50 feet activity   Assist Walk 50 feet with 2 turns activity did not occur: Safety/medical concerns         Walk 150 feet activity   Assist Walk 150 feet activity did not occur: Safety/medical concerns         Walk 10 feet on uneven surface  activity   Assist Walk 10 feet on uneven surfaces activity did not occur: Safety/medical concerns         Wheelchair     Assist Is the patient using a wheelchair?: Yes Type of Wheelchair: Manual    Wheelchair assist level: Dependent - Patient 0%      Wheelchair 50 feet with 2 turns activity    Assist        Assist Level: Dependent - Patient 0%   Wheelchair 150 feet activity     Assist      Assist Level: Dependent - Patient 0%    Blood pressure 127/78, pulse 79, temperature 98.5 F (36.9 C), resp. rate 18, height 6\' 5"  (1.956 m), weight 100.2 kg, SpO2 96 %.    Medical Problem List and Plan: 1. 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  DVT right posterior tibial and peroneal veins (03/22/22): Discussed with mother that repeat doppler shows no new clots and resolution of one previous clot.  -anticoagulation: continue Eliquis             -antiplatelet therapy: none 3. Pain: continueTylenol prn 4. Mood/sleep: LCSW to evaluate and provide emotional support             -antipsychotic agents: none  -increasing propranolol for restlessness  -will start low dose seroquel at night for restlessness/agitation 5. Impaired attention: This patient is not capable of making decisions on his own behalf.             -continue amantadine 200mg  bid             - ritalin  25mg  bid -continue               6. Dysphagia. S/p PEG 3/29.  --NPO -continue TF/boluses -Continue zinc oxide to protect skin surrounding PEG tube -5/30 replaced PEG under fluoro. Appreciate IR help -PEG site remains clean, with some irritation 7. Fluids/Electrolytes/Nutrition: Routine Is and Os and follow-up chemistries             --continue tube feeds Jevity 1.5 boluses             --continue Prosource             - prealbumin 35.8, gaining weight Filed Weights   05/29/22 0500 05/30/22 0500 05/31/22 0404  Weight: 100.2 kg 99.8 kg 100.2 kg    9: C1 right transverse process fracture--he's out of collar 10: Left Occipital condyle fracture--out of collar 11:  Acute ventilator respiratory failure requiring trach and PEG 3/29              --  decannulated , stoma closed             --changed Robitussin to 25 mL qid  -continue robinul via PEG to assist with cough    12: Right ear laceration:  healing 14: Tongue laceration: monitor healing 15: Hypertension: lopressor 50mg  bid      -hr controlled on propranolol 30mg  tid    05/31/2022    4:04 AM 05/30/2022    7:29 PM 05/30/2022    1:50 PM  Vitals with BMI  Weight 221 lbs    BMI 26.2    Systolic 127 128 [id number]  Diastolic 78 83 92  Pulse 79 97 93    16. Hypoglycemic to 69: otherwise in 90s: will d/c ISS since not requiring   18. Iron deficiency:  continue daily iron supplement  19. Tremor,?clonus: continue trial of propranolol 10mg  tid  Increased propranolol to 30 mg tid     -see above  LOS: 23 days A FACE TO FACE EVALUATION WAS PERFORMED  Ranelle OysterZachary T Larenda Reedy 05/31/2022, 9:58 AM

## 2022-06-01 DIAGNOSIS — Z9911 Dependence on respirator [ventilator] status: Secondary | ICD-10-CM

## 2022-06-01 DIAGNOSIS — Z431 Encounter for attention to gastrostomy: Secondary | ICD-10-CM

## 2022-06-01 LAB — GLUCOSE, CAPILLARY
Glucose-Capillary: 102 mg/dL — ABNORMAL HIGH (ref 70–99)
Glucose-Capillary: 105 mg/dL — ABNORMAL HIGH (ref 70–99)
Glucose-Capillary: 110 mg/dL — ABNORMAL HIGH (ref 70–99)
Glucose-Capillary: 118 mg/dL — ABNORMAL HIGH (ref 70–99)
Glucose-Capillary: 130 mg/dL — ABNORMAL HIGH (ref 70–99)
Glucose-Capillary: 145 mg/dL — ABNORMAL HIGH (ref 70–99)
Glucose-Capillary: 97 mg/dL (ref 70–99)

## 2022-06-01 LAB — BASIC METABOLIC PANEL
Anion gap: 12 (ref 5–15)
BUN: 17 mg/dL (ref 6–20)
CO2: 27 mmol/L (ref 22–32)
Calcium: 9.9 mg/dL (ref 8.9–10.3)
Chloride: 100 mmol/L (ref 98–111)
Creatinine, Ser: 0.82 mg/dL (ref 0.61–1.24)
GFR, Estimated: >60 mL/min (ref >60)
Glucose, Bld: 93 mg/dL (ref 70–99)
Potassium: 4 mmol/L (ref 3.5–5.1)
Sodium: 139 mmol/L (ref 135–145)

## 2022-06-01 LAB — CBC
HCT: 40.3 % (ref 39.0–52.0)
Hemoglobin: 13.8 g/dL (ref 13.0–17.0)
MCH: 31.4 pg (ref 26.0–34.0)
MCHC: 34.2 g/dL (ref 30.0–36.0)
MCV: 91.8 fL (ref 80.0–100.0)
Platelets: 150 10*3/uL (ref 150–400)
RBC: 4.39 MIL/uL (ref 4.22–5.81)
RDW: 12 % (ref 11.5–15.5)
WBC: 8.7 10*3/uL (ref 4.0–10.5)
nRBC: 0 % (ref 0.0–0.2)

## 2022-06-01 MED ORDER — OSMOLITE 1.5 CAL PO LIQD
474.0000 mL | Freq: Four times a day (QID) | ORAL | Status: DC
Start: 2022-06-01 — End: 2022-06-16
  Administered 2022-06-01 – 2022-06-16 (×60): 474 mL
  Filled 2022-06-01 (×59): qty 474

## 2022-06-01 NOTE — Progress Notes (Signed)
PROGRESS NOTE   Subjective/Complaints:  Per pt's father, pt slept better overnight.  Usually has BM with therapy.  Walked 77/65 ft. With PT yesterday.   Is still NPO, but very sleepy this AM.   ROS: limited due to cognition/sedation  Objective:   No results found. Recent Labs    06/01/22 0622  WBC 8.7  HGB 13.8  HCT 40.3  PLT 150      Recent Labs    06/01/22 0622  NA 139  K 4.0  CL 100  CO2 27  GLUCOSE 93  BUN 17  CREATININE 0.82  CALCIUM 9.9       Intake/Output Summary (Last 24 hours) at 06/01/2022 0903 Last data filed at 06/01/2022 0528 Gross per 24 hour  Intake --  Output 1350 ml  Net -1350 ml           Physical Exam: Vital Signs Blood pressure 124/79, pulse 85, temperature 98.6 F (37 C), resp. rate 16, height 6\' 5"  (1.956 m), weight 101.6 kg, SpO2 94 %.    General: asleep; woke briefly but dad in room; speaking with me;  NAD HENT: oropharynx moist CV: regular rate; no JVD Pulmonary: CTA B/L; no W/R/R- good air movement GI: soft, NT, ND, (+)BS Psychiatric: sleepy- woke briefly, but went back to sleep Neurological: sleepy Ext: no clubbing, cyanosis, or edema Psych: internally distracted Musculoskeletal:        no pain with ROM  Skin:    Comments: left knee abrasion with scar Neurological:     Comments: focusing on movements of hand. Less eye contact today.    DTR's brisk on right.   Possible mild flexor tone in right hand. Engages with RUE and RLE inconsistently.     Mild tremor  Assessment/Plan: 1. Functional deficits which require 3+ hours per day of interdisciplinary therapy in a comprehensive inpatient rehab setting. Physiatrist is providing close team supervision and 24 hour management of active medical problems listed below. Physiatrist and rehab team continue to assess barriers to discharge/monitor patient progress toward functional and medical goals  Care  Tool:  Bathing    Body parts bathed by patient: Right arm, Left arm, Chest, Abdomen, Front perineal area, Buttocks, Right upper leg, Left upper leg, Right lower leg, Left lower leg   Body parts bathed by helper: Right arm, Right lower leg, Left lower leg, Chest, Left arm, Front perineal area, Buttocks, Right upper leg, Left upper leg, Abdomen, Face     Bathing assist Assist Level: 2 Helpers     Upper Body Dressing/Undressing Upper body dressing   What is the patient wearing?: Pull over shirt    Upper body assist Assist Level: Total Assistance - Patient < 25%    Lower Body Dressing/Undressing Lower body dressing      What is the patient wearing?: Underwear/pull up, Pants     Lower body assist Assist for lower body dressing: 2 Helpers     Toileting Toileting    Toileting assist Assist for toileting: 2 Helpers     Transfers Chair/bed transfer  Transfers assist  Chair/bed transfer activity did not occur: Safety/medical concerns  Chair/bed transfer assist level: 2 Helpers  Locomotion Ambulation   Ambulation assist   Ambulation activity did not occur: Safety/medical concerns          Walk 10 feet activity   Assist  Walk 10 feet activity did not occur: Safety/medical concerns        Walk 50 feet activity   Assist Walk 50 feet with 2 turns activity did not occur: Safety/medical concerns         Walk 150 feet activity   Assist Walk 150 feet activity did not occur: Safety/medical concerns         Walk 10 feet on uneven surface  activity   Assist Walk 10 feet on uneven surfaces activity did not occur: Safety/medical concerns         Wheelchair     Assist Is the patient using a wheelchair?: Yes Type of Wheelchair: Manual    Wheelchair assist level: Dependent - Patient 0%      Wheelchair 50 feet with 2 turns activity    Assist        Assist Level: Dependent - Patient 0%   Wheelchair 150 feet activity      Assist      Assist Level: Dependent - Patient 0%   Blood pressure 124/79, pulse 85, temperature 98.6 F (37 C), resp. rate 16, height 6\' 5"  (1.956 m), weight 101.6 kg, SpO2 94 %.    Medical Problem List and Plan: 1. 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  DVT right posterior tibial and peroneal veins (03/22/22): Discussed with mother that repeat doppler shows no new clots and resolution of one previous clot.  -anticoagulation: continue Eliquis             -antiplatelet therapy: none 3. Pain: continueTylenol prn 4. Mood/sleep: LCSW to evaluate and provide emotional support             -antipsychotic agents: none  -increasing propranolol for restlessness  -will start low dose seroquel at night for restlessness/agitation  6/12- sleeping still this AM- might be form med changes, vs just sleepy. -will monitor for trend 5. Impaired attention: This patient is not capable of making decisions on his own behalf.             -continue amantadine 200mg  bid             - ritalin  25mg  bid -continue               6. Dysphagia. S/p PEG 3/29.  --NPO -continue TF/boluses -Continue zinc oxide to protect skin surrounding PEG tube -5/30 replaced PEG under fluoro. Appreciate IR help -PEG site remains clean, with some irritation 7. Fluids/Electrolytes/Nutrition: Routine Is and Os and follow-up chemistries             --continue tube feeds Jevity 1.5 boluses             --continue Prosource             -  prealbumin 35.8, gaining weight Filed Weights   05/30/22 0500  05/31/22 0404 06/01/22 0400  Weight: 99.8 kg 100.2 kg 101.6 kg    9: C1 right transverse process fracture--he's out of collar 10: Left Occipital condyle fracture--out of collar 11: Acute ventilator respiratory failure requiring trach and PEG 3/29              --decannulated , stoma closed             --changed Robitussin to 25 mL qid  -continue robinul via PEG to assist with cough    12: Right ear laceration:  healing 14: Tongue laceration: monitor healing 15: Hypertension: lopressor 50mg  bid      -hr controlled on propranolol 30mg  tid  6/12- BP controlled- con't regimen    06/01/2022    4:00 AM 05/31/2022    7:55 PM 05/31/2022    1:00 PM  Vitals with BMI  Weight 224 lbs    BMI 26.56    Systolic 124 131 07/31/2022  Diastolic 79 87 95  Pulse 85 89 72    16. Hypoglycemic to 69: otherwise in 90s: will d/c ISS since not requiring   18. Iron deficiency:  continue daily iron supplement  19. Tremor,?clonus: continue trial of propranolol 10mg  tid  Increased propranolol to 30 mg tid     -see above 20, Dispo  6/12- asking for grounds pass- will let Dr 299 ddecide  LOS: 24 days A FACE TO FACE EVALUATION WAS PERFORMED  Teneil Shiller 06/01/2022, 9:03 AM

## 2022-06-01 NOTE — Progress Notes (Signed)
Speech Language Pathology TBI Note  Patient Details  Name: Carl Carpenter MRN: [id number] Date of Birth: [birth year]  Today's Date: 06/01/2022 SLP Individual Time: 1205-1250 SLP Individual Time Calculation (min): 45 min  Short Term Goals: Week 4: SLP Short Term Goal 1 (Week 4): Pt will consume therapeutic trials of 1/2 teaspoons of thin liquids and ice chips with minimal overt s/s of aspiration over 3 consecutive sessions and max assist use of swallowing precautions prior to MBS or FEES. SLP Short Term Goal 2 (Week 4): Pt will vocalize on command in 25% of opportunities with max assist multimodal cues over 2 sessions. SLP Short Term Goal 3 (Week 4): Pt will answer basic yes/no questions using written choices on a white board, gestures, or eye gaze in 50% of opportunities with max assist multimodal cues over 2 sessions. SLP Short Term Goal 4 (Week 4): Pt will follow 1 step  commands in  50% of opportunities with max assist multimodal cues over 2 sessions. SLP Short Term Goal 5 (Week 4): Pt will demonstre sustained attention on a targeted stimulus for 2 minutes with max assist multimodal cues over 2 sessions.  Skilled Therapeutic Interventions: Skilled treatment session focused on cognitive and dysphagia goals. Upon arrival., patient was awake while upright in the wheelchair. SLP facilitated session by providing oral care via the suction toothbrush. Patient independently grabbed the suction toothbrush, placed it in his oral cavity and initiated brushing the left side. Max tactile cues were needed for thoroughness with task. SLP then provided the patient with a metal spoon with trials of small ice chips. Patient self-fed X 1 with Max A multimodal cues needed for patient to open his oral cavity as he had clenched down on the spoon. However, with extra time patient able to accept a bolus and close his oral cavity when SLP provided the bolus. Patient masticated ice and initiated a somewhat timely swallow X  1 with other trials resulting in minimal oral manipulation and overt coughing. SLP also attempted to initiate phonation my tilting patient to a 45 degree angle in his chair and creating tension by having the patient pull himself forward. Patient able to pull himself forward but unable to phonate despite tension. Patient had been incontinent of urine and was transferred back to bed via the [facility] with +2-3 assist for safety. Patient followed commands with extra time and Mod-Max A verbal and tactile cues during transfer. Patient left upright in bed with family present. Continue with current plan of care.      Pain No indications of pain   Agitated Behavior Scale: TBI No agitation noted   Therapy/Group: Individual Therapy   Lille Karim 06/01/2022, 3:10 PM

## 2022-06-01 NOTE — Progress Notes (Signed)
Occupational Therapy Weekly Progress Note  Patient Details  Name: Carl Carpenter MRN: [id number] Date of Birth: 24-Sep-1986  Beginning of progress report period: May 25, 2022 End of progress report period: June 01, 2022  Today's Date: 06/01/2022 OT Individual Time: 1330-1445 OT Individual Time Calculation (min): 75 min   Patient has met 2 of 3 short term goals.  Carl Carpenter continues to make slow, subtle progress toward his OT goals, with his largest improvements occurring in sitting balance, alertness, BUE integration/activation, and following patterns of movement. He is still very inconsistent in following simple one step commands in the context of ADLs and requires max multimodal cueing and still is only 25% accurate at the most. A family meeting is being held this Thursday to discuss OT/PT/SLP POC, goals, and expectations management.   Patient continues to demonstrate the following deficits: muscle weakness, decreased cardiorespiratoy endurance, decreased midline orientation and decreased attention to right, decreased initiation, decreased attention, decreased awareness, decreased problem solving, decreased safety awareness, decreased memory, delayed processing, and demonstrates behaviors consistent with Rancho Level III, and decreased sitting balance, decreased standing balance, decreased postural control, hemiplegia, and decreased balance strategies and therefore will continue to benefit from skilled OT intervention to enhance overall performance with BADL and Reduce care partner burden.  Patient progressing toward long term goals..  Continue plan of care.  OT Short Term Goals Week 3:  OT Short Term Goal 1 (Week 3): Caregivers will demonstrate competency with UE PROM OT Short Term Goal 1 - Progress (Week 3): Met OT Short Term Goal 2 (Week 3): Pt will initiate 1 step of UB dressing with max cueing OT Short Term Goal 2 - Progress (Week 3): Progressing toward goal OT Short Term Goal 3 (Week 3):  Pt will maintain sitting balance statically with max A OT Short Term Goal 3 - Progress (Week 3): Met Week 4:  OT Short Term Goal 1 (Week 4): Pt will sit EOB with no more than mod A consistently OT Short Term Goal 2 (Week 4): Pt's caregivers will initiate transfer training in prep for d/c OT Short Term Goal 3 (Week 4): Pt will complete oral care with mod A  Skilled Therapeutic Interventions/Progress Updates:    Pt received supine with no indications of pain, initially very lethargic. He was transferred dependently to EOB where he became more alert. He stood in the stedy with mod A+2 support. He requires assist only to initiate the stand and then is able to finish stand with as little as min A. He was able to maintain BUE support on the stedy bar throughout the transfer. He was transferred to the TIS w/c. His face was pale and diaphoretic and he was grimacing. Took pt into the bathroom for stedy transfer to the [facility] as this presentation has previously occurred with need to have a BM. He stood with the stedy again and was transferred to the [facility] over the toilet for more natural environment, mod A +2 for transfer. Pt indeed had to have a BM and sat for several minutes, quite restless but obviously bearing down. He passed two medium sized very firm pieces of stool. He stood and then sat several times as hard stool as still evident in his rectum. He was able to pass one more piece of stool before standing for total A peri hygiene and donning of pants. Pt was taken via TIS w/c to the therapy gym. He completed a stand pivot transfer to the therapy mat with max +2 assist. With  his son and granddaughter present and participating in therapy, pt followed several instructions to participate in grasp/release with large connect 4 game with the children. He required mod cueing to terminate. He then participated in ball grasp/release with again mod cueing. He returned to the chair, max +2 stand pivot. He was left sitting up  with all needs met, family present.   Therapy Documentation Precautions:  Precautions Precautions: Fall Precaution Comments: PEG with binder Restrictions Weight Bearing Restrictions: No  Therapy/Group: Individual Therapy  [name] 06/01/2022, 6:47 AM

## 2022-06-01 NOTE — Progress Notes (Signed)
Nutrition Brief Note  RD received a message from pharmacy that out of Jevity 1.5 cartons. RD to change order to Osmolite 1.5 cartons.   New TF regimen via PEG: 2 cartons Osmolite 1.5 - QID 45 mL ProSource TF - QID 50 mL free water flush before and after each feeding Provides 3004 kcal, 164 gm protein, and 1448 mL free water (1848 mL total free water) daily.  RD to provide full follow-up note on schedule throughout the week.    Kirby Crigler RD, LDN Clinical Dietitian See Loretha Stapler for contact information.

## 2022-06-01 NOTE — Progress Notes (Signed)
Physical Therapy TBI Note  Patient Details  Name: Carl Carpenter MRN: [id number] Date of Birth: 08/17/1986  Today's Date: 06/01/2022 PT Individual Time: 1048-1200 PT Individual Time Calculation (min): 72 min   Short Term Goals: Week 4:  PT Short Term Goal 1 (Week 4): Pt will attend to therapy task for 5 minutes with max cues. PT Short Term Goal 2 (Week 4): Pt will completes bed to chair transfer with max/totalA +1. PT Short Term Goal 3 (Week 4): Patient with progress therapeutic gait >50 ft with max A +2 for increased activity tolerance and motor planning  Skilled Therapeutic Interventions/Progress Updates:     Pt received supine in bed and in no apparent pain. Supine to sit with maxA +2 and cues for body mechanics and sequencing. Pt performs squat pivot transfer to WC with maxA +2 and cues for hand placement, initiation and sequencing. WC transport to gym for time management. Pt performs stand pivot transfer to mat with maxA +2 and same cueing. Pt works on sitting balance in short sitting on edge of mat. With manual facilitation of midline positioning and hand placement, pt able to maintain static sitting for ~30 seconds at a time. Generally pt requires modA +1 for balance with tendency for R lean and posterior drift. Pt also positioned in L sideleaning for approximation and NM feedback through L upper extremity.   Pt performs sit to stand with maxA +1 and cues for anterior trunk lean, initiation, and sequencing. Pt ambulates x15' with 3 musketeers technique and maxA +2, with +3 for WC follow. Following seated rest breaks, pt ambulates additional 3x70' with 3 musketeers and leg loops on R lower extremity. Pt requires assistance for initiation for R foot progression >75% of time, but is able to progress L foot without manual assistance. Multimodal cues for lateral weight shifting, upright posture, and reciprocal gait pattern. WC transport back to room. Left seated with alarm intact and all needs  within reach.  Therapy Documentation Precautions:  Precautions Precautions: Fall Precaution Comments: PEG with binder Restrictions Weight Bearing Restrictions: No    Therapy/Group: Individual Therapy  [name], PT, DPT 06/01/2022, 6:19 PM

## 2022-06-02 DIAGNOSIS — I824Z1 Acute embolism and thrombosis of unspecified deep veins of right distal lower extremity: Secondary | ICD-10-CM

## 2022-06-02 DIAGNOSIS — F068 Other specified mental disorders due to known physiological condition: Secondary | ICD-10-CM

## 2022-06-02 DIAGNOSIS — R1312 Dysphagia, oropharyngeal phase: Secondary | ICD-10-CM

## 2022-06-02 DIAGNOSIS — G969 Disorder of central nervous system, unspecified: Secondary | ICD-10-CM

## 2022-06-02 DIAGNOSIS — R251 Tremor, unspecified: Secondary | ICD-10-CM

## 2022-06-02 DIAGNOSIS — S069X4A Unspecified intracranial injury with loss of consciousness of 6 hours to 24 hours, initial encounter: Secondary | ICD-10-CM

## 2022-06-02 DIAGNOSIS — S069X0S Unspecified intracranial injury without loss of consciousness, sequela: Secondary | ICD-10-CM

## 2022-06-02 LAB — GLUCOSE, CAPILLARY
Glucose-Capillary: 103 mg/dL — ABNORMAL HIGH (ref 70–99)
Glucose-Capillary: 100 mg/dL — ABNORMAL HIGH (ref 70–99)
Glucose-Capillary: 116 mg/dL — ABNORMAL HIGH (ref 70–99)
Glucose-Capillary: 95 mg/dL (ref 70–99)
Glucose-Capillary: 97 mg/dL (ref 70–99)

## 2022-06-02 MED ORDER — FLEET ENEMA 7-19 GM/118ML RE ENEM
1.0000 | ENEMA | Freq: Every day | RECTAL | Status: DC | PRN
Start: 2022-06-02 — End: 2022-06-30

## 2022-06-02 MED ORDER — MAGNESIUM HYDROXIDE 400 MG/5ML PO SUSP
30.0000 mL | Freq: Every day | ORAL | Status: DC | PRN
Start: 2022-06-02 — End: 2022-06-30
  Administered 2022-06-16 – 2022-06-22 (×3): 30 mL via ORAL
  Filled 2022-06-02 (×3): qty 30

## 2022-06-02 MED ORDER — AMANTADINE HCL 50 MG/5ML PO SOLN
100.0000 mg | Freq: Two times a day (BID) | ORAL | Status: DC
  Administered 2022-06-02 – 2022-06-04 (×4): 100 mg
  Filled 2022-06-02 (×5): qty 10

## 2022-06-02 MED ORDER — CARBIDOPA-LEVODOPA 10-100 MG PO TABS
1.0000 | ORAL_TABLET | Freq: Three times a day (TID) | ORAL | Status: DC
  Administered 2022-06-02 – 2022-06-04 (×6): 1
  Filled 2022-06-02 (×7): qty 1

## 2022-06-02 MED ORDER — SENNOSIDES-DOCUSATE SODIUM 8.6-50 MG PO TABS
1.0000 | ORAL_TABLET | Freq: Every day | ORAL | Status: DC
  Administered 2022-06-02 – 2022-06-03 (×2): 1 via ORAL
  Filled 2022-06-02 (×3): qty 1

## 2022-06-02 MED ORDER — BISACODYL 10 MG RE SUPP
10.0000 mg | Freq: Every day | RECTAL | Status: DC | PRN
  Administered 2022-06-02: 10 mg via RECTAL
  Filled 2022-06-02: qty 1

## 2022-06-02 MED ORDER — MAGNESIUM HYDROXIDE 400 MG/5ML PO SUSP
30.0000 mL | ORAL | Status: AC
Start: 2022-06-02 — End: 2022-06-02
  Administered 2022-06-02: 30 mL via ORAL
  Filled 2022-06-02: qty 30

## 2022-06-02 NOTE — Progress Notes (Signed)
Speech Language Pathology Daily Session Note  Patient Details  Name: Carl Carpenter MRN: [id number] Date of Birth: 1986/02/02  Today's Date: 06/02/2022 SLP Individual Time: 1345-1430 SLP Individual Time Calculation (min): 45 min  Short Term Goals: Week 4: SLP Short Term Goal 1 (Week 4): Pt will consume therapeutic trials of 1/2 teaspoons of thin liquids and ice chips with minimal overt s/s of aspiration over 3 consecutive sessions and max assist use of swallowing precautions prior to MBS or FEES. SLP Short Term Goal 2 (Week 4): Pt will vocalize on command in 25% of opportunities with max assist multimodal cues over 2 sessions. SLP Short Term Goal 3 (Week 4): Pt will answer basic yes/no questions using written choices on a white board, gestures, or eye gaze in 50% of opportunities with max assist multimodal cues over 2 sessions. SLP Short Term Goal 4 (Week 4): Pt will follow 1 step  commands in  50% of opportunities with max assist multimodal cues over 2 sessions. SLP Short Term Goal 5 (Week 4): Pt will demonstre sustained attention on a targeted stimulus for 2 minutes with max assist multimodal cues over 2 sessions.  Skilled Therapeutic Interventions: Skilled treatment session focused on cognitive-linguistic goals. Upon arrival, patient was awake in the bed. Patient sat EOB and initiated donning and pulling up his shorts with extra time and Mod verbal and tactile cues. Patient was transferred to the wheelchair via the [facility] with +2-3 assist for safety with Mod verbal and tactile cues needed for initiation and to follow commands throughout transfer. SLP facilitated session by providing Max A verbal, tactile and visual cues for sustained attention in a mild-moderately distracting environment. Patient identified items from a field of 5 with 100% accuracy and was able to match a written word to an item with 65% accuracy. SLP utilized multimodal cues in hopes of facilitating phonation or verbalizations  without success. However, patient's significant other notes increased spontaneous verbalizations during automatic exchanges (hey, I love you, happy birthday).  Patient left upright in wheelchair with belt on and family present. Continue with current plan of care.      Pain No indications of pain   Therapy/Group: Individual Therapy  Carl Carpenter 06/02/2022, 3:45 PM

## 2022-06-02 NOTE — Patient Care Conference (Signed)
Inpatient RehabilitationTeam Conference and Plan of Care Update Date: 06/02/2022   Time: 10:45 AM    [name]      Medical Record Number: [id number]  Date of Birth: [birth year] Sex: Male         Room/Bed: 4W14C/4W14C-01 Payor Info: Payor: [place] / Plan: [place] MEDICAID [place] / Product Type: *No Product type* /    Admit Date/Time:  05/08/2022  3:31 PM  Primary Diagnosis:  TBI (traumatic brain injury) [facility])  Hospital Problems: Principal Problem:   TBI (traumatic brain injury) [facility]) Active Problems:   Malnutrition of moderate degree    Expected Discharge Date: Expected Discharge Date: 06/16/22  Team Members Present: Physician leading conference: Dr. Alger Simons Social Worker Present: Loralee Pacas, [place] Nurse Present: Dorthula Nettles, RN PT Present: Tereasa Coop, PT OT Present: Cherylynn Ridges, OT SLP Present: Weston Anna, SLP PPS Coordinator present : Gunnar Fusi, SLP     Current Status/Progress Goal Weekly Team Focus  Bowel/Bladder   Pt incontinent of bowel/bladder  Pt will gain continence of bowel/bladder  Will assess qshift and PRN   Swallow/Nutrition/ Hydration   NPO with PEG, consistent overt s/s of aspiration with trials  Mod A with least restrictive diet  ongoing trials with focus on oral acceptance, manipulation and swallow initiation   ADL's   Gains made in sitting balance, initiation, and BUE integration. Does best in settings of high repetition, following pattern vs ADLs. For ADLs he is still a total +2 functionally  mod-max A overall.  Simple command following, gross motor skills, transfers, family education   Mobility   maxA +1 supine to sit, maxA +2 bed to chair transfers, gait up to 48' with maxA +2 and leg loops  maxA bed level  sitting balance, attention, therapeutic gait, family ed, transfers   Communication   Nonverbal, Max-Total A for multimodal communication  Mod A  establish yes/no  communication, purposeful phonation   Safety/Cognition/ Behavioral Observations            Pain   Pt showes no sign of pain  Pt will continue to show no signs of pain  Will assess qshift and PRN   Skin   Pt has redness around peg tube and mustach and beard  Pt's skin will clear up  Will assess qshift and PRN     Discharge Planning:  Pt is uninsured.  Pt will d/c to home with significant other and will have 24/7 care from her, and PRN support from other family members. Fam mtg on Thursday (6/15) at 10am with family.   Team Discussion: Making small gains. Nodded head at MD today. Adjusting meds, started Depakote. Patient is restless. Incontinent B/B, no reported pain. Family meeting Thursday at 10 AM. MBS may schedule next week.  Patient on target to meet rehab goals: Mod/max assist goals. Currently total assist ADL's. Ambulation with lots of assist. Mod assist +1 STS. Initiated oral care and self-feeding.  *See Care Plan and progress notes for long and short-term goals.   Revisions to Treatment Plan:  Adjusting medications.   Teaching Needs: Family education, medication management, bowel/bladder management, safety awareness, transfer/gait training, etc.   Current Barriers to Discharge: Incontinence, Insurance for SNF coverage, and Behavior  Possible Resolutions to Barriers: Family education Continue outpatient therapy Family meeting     Medical Summary Current Status: more alert. restless/agitation---seroquel/propranolol. engaging more with environment, automatic speech rare  Barriers to Discharge: Medical stability;Behavior   Possible Resolutions to  Barriers/Weekly Focus: daily assessment of labs, sleep/wake. stimulation/activation by meds and environment   Continued Need for Acute Rehabilitation Level of Care: The patient requires daily medical management by a physician with specialized training in physical medicine and rehabilitation for the following reasons: Direction  of a multidisciplinary physical rehabilitation program to maximize functional independence : Yes Medical management of patient stability for increased activity during participation in an intensive rehabilitation regime.: Yes Analysis of laboratory values and/or radiology reports with any subsequent need for medication adjustment and/or medical intervention. : Yes   I attest that I was present, lead the team conference, and concur with the assessment and plan of the team.   Cristi Loron 06/02/2022, 2:40 PM

## 2022-06-02 NOTE — Progress Notes (Signed)
Occupational Therapy TBI Note  Patient Details  Name: Carl Carpenter MRN: [id number] Date of Birth: [birth year]  Today's Date: 06/02/2022 OT Individual Time: 0849-1000 OT Individual Time Calculation (min): 71 min    Short Term Goals: Week 4:  OT Short Term Goal 1 (Week 4): Pt will sit EOB with no more than mod A consistently OT Short Term Goal 2 (Week 4): Pt's caregivers will initiate transfer training in prep for d/c OT Short Term Goal 3 (Week 4): Pt will complete oral care with mod A  Skilled Therapeutic Interventions/Progress Updates:  Pt greeted supine in bed with no indications of pain, pts fiance present during session. Pt needed total A to don new brief from supine. MAX A +2 for supine>sit with pt needing MAX A for static sitting balance, pt was able to stand to stedy this session from EOB with CGA! Pt needed MAX multimodal cues to initiate sitting to seat of stedy.  Pt completed wash up at sink in w/c with pt able to initiate washing chest with initial hand over hand cues but then perseverates on washing chest, pt was able to wash face with only set- up assist and MIN verbal cues. Overall, pt requires MAX A +2 for bathing at sink. Total A to don [place] shirt and MAX A +2 to don pants via sit>stand at sink with pt able to stand with MAX A +2 while OTA and RT pulled up pants to waist line.  Pt transported to gym where pt completed squat pivot to EOM with MAX A+2. Worked on attention, awareness, and  localized response. Pt needed MAX A for static sitting balance. Worked on catching and releasing ball from EOM with pt able to catch ball but needed assist to throw. Poor awareness and attention noted during EOM activities.  Pt able to stand from EOM with MAX A +2, pt with difficulty shifting weight to LLE. MAX A +2 for squat pivot back to w/c. Total A transport back to room where pt left up in room with fiance present.                 Therapy Documentation Precautions:  Precautions Precautions:  Fall Precaution Comments: PEG with binder Restrictions Weight Bearing Restrictions: No    Pain: no indications of pain noted during session   Therapy/Group: Individual Therapy  Barron Schmid 06/02/2022, 12:18 PM

## 2022-06-02 NOTE — Progress Notes (Signed)
Patient ID: Carl Carpenter, male   DOB: 25-Oct-1986, 36 y.o.   MRN: [id number]  SW met with pt father in room to provide updates from team conference, and confirm family meeting on Thursday at [place], MSW, [place] Office: [phone] Cell: [phone] Fax: [phone]

## 2022-06-02 NOTE — Progress Notes (Signed)
Physical Therapy TBI Note  Patient Details  Name: Carl Carpenter MRN: [id number] Date of Birth: Nov 05, 1986  Today's Date: 06/02/2022 PT Individual Time: 1445-1558 PT Individual Time Calculation (min): 73 min   Short Term Goals: Week 4:  PT Short Term Goal 1 (Week 4): Pt will attend to therapy task for 5 minutes with max cues. PT Short Term Goal 2 (Week 4): Pt will completes bed to chair transfer with max/totalA +1. PT Short Term Goal 3 (Week 4): Patient with progress therapeutic gait >50 ft with max A +2 for increased activity tolerance and motor planning  Skilled Therapeutic Interventions/Progress Updates:     Pt received seated in WC and in no apparent pain. WC transport to gym for time management. Pt noted to be attempting to grab wheels of Tilt in space WC as if he was grabbing rims on standard WC. Pt performs stand pivot transfer from TIS WC to standard WC with modA +2 and multimodal cueing for initiation, sequencing, and positioning. Pt able to grab both hand rims on standard WC and pushes with L upper extremity, but does not effectively push with R upper extremity. PT provides hand over hand assistance to push with R upper extremity but pt repeatedly slides hips forward and pushes posteriorly, causing hips to come close to edge of WC. Pt repositioned several times with maxA +2 but continues to slide forward. maxA +2 for stand pivot back to tilt in space WC.   Pt performs multiple reps of sit to stand with mod/maxA +2 and cues for anterior weight shift, initiation, sequencing, and power up. Pt ambulates x77' and x80' with maxA +2 and leg loops on R lower extremity. Pt initiates step with R lower extremity ~50% of time but requires manual assistance via leg loops to take functional step. 3 musketeers technique utilized to assist with maintaining upright and neutral trunk, along with constant verbal and manual cueing for weight shift and step progression.   WC transport back to room. Pt very  "fidgety" and requiring increased assistance for safety. Pt performs stand pivot back to bed with +3 assistance for safety (including pt's father Teaching laboratory technician"). TotalA +2 for sit to supine. Pt noted to have urine soiled brief. Pt performs bilateral rolling with maxA to perform hygiene and don clean brief. Left supine with all needs within reach.  Therapy Documentation Precautions:  Precautions Precautions: Fall Precaution Comments: PEG with binder Restrictions Weight Bearing Restrictions: No   Therapy/Group: Individual Therapy  [name], PT, DPT 06/02/2022, 5:52 PM
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
## 2022-06-03 ENCOUNTER — Inpatient Hospital Stay (HOSPITAL_COMMUNITY): Payer: Medicaid Other

## 2022-06-03 DIAGNOSIS — Z7189 Other specified counseling: Secondary | ICD-10-CM

## 2022-06-03 LAB — GLUCOSE, CAPILLARY
Glucose-Capillary: 101 mg/dL — ABNORMAL HIGH (ref 70–99)
Glucose-Capillary: 107 mg/dL — ABNORMAL HIGH (ref 70–99)
Glucose-Capillary: 108 mg/dL — ABNORMAL HIGH (ref 70–99)
Glucose-Capillary: 126 mg/dL — ABNORMAL HIGH (ref 70–99)
Glucose-Capillary: 132 mg/dL — ABNORMAL HIGH (ref 70–99)
Glucose-Capillary: 92 mg/dL (ref 70–99)

## 2022-06-03 MED ORDER — QUETIAPINE FUMARATE 25 MG PO TABS: 25.0000 mg | ORAL_TABLET | Freq: Two times a day (BID) | ORAL | Status: DC | PRN

## 2022-06-03 NOTE — Progress Notes (Signed)
Occupational Therapy TBI Note  Patient Details  Name: Carl Carpenter MRN: [id number] Date of Birth: [birth year]  Today's Date: 06/03/2022 OT Individual Time: 1303-1403 OT Individual Time Calculation (min): 60 min    Short Term Goals: Week 4:  OT Short Term Goal 1 (Week 4): Pt will sit EOB with no more than mod A consistently OT Short Term Goal 2 (Week 4): Pt's caregivers will initiate transfer training in prep for d/c OT Short Term Goal 3 (Week 4): Pt will complete oral care with mod A  Skilled Therapeutic Interventions/Progress Updates:  Pt greeted supine in bed, no indications of pain during session. Session focus on BADL reeducation, functional mobility, dynamic standing balance, attention, initiation  and decreasing overall caregiver burden. Pt needed MAX A +2 to transition from supine>sit; MOD A for static sitting balance. Utilized stedy to transfer pt to rolling shower chair, pt able to stand to stedy with MODA+2. Total A transfer to shower chair via stedy, total A transfer into shower via rolling shower chair. Overall, pt requires MAX A +2 for bathing with pt presenting with delayed bathing responses during showering, pt did initiate washing his beard and face with no cues. Of note during showering pt did impulsively pull at [facility]; LPN aware.  Pt exited shower with total A via shower chair. Pt needed MAX A for UB dressing needing assist to thread UEs but able to assist with pulling shirt down in front.  MAX A+2 for LB dressing with pt able to assist with pulling pants up waist with LUE in sitting, pt needed assist to initially thread pants and pull pants up to waist line in standing in stedy.  Of note pt did state "[age]" when asked how old his son was. Pt was also able to state "chair" when given the choice of whether pt wanted to return to bed or sit up in chair. When confirming pt did state "yes." Pt noted to smile at his dad and showing affection to his fiance when she arrived. Pt also  initiated returning to bed from w/c noted to be pulling at bed rail trying to stand. MAX A +2 for stand pivot transfer back to bed, pt initiated lying down with MIN A, even able to elevate BLEs. pt left supine in bed with all needs within reach and family present.                       Therapy Documentation Precautions:  Precautions Precautions: Fall Precaution Comments: PEG with binder Restrictions Weight Bearing Restrictions: No  Pain: no indications of pain noted during session       Therapy/Group: Individual Therapy  Precious Haws 06/03/2022, 3:51 PM
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
## 2022-06-03 NOTE — Progress Notes (Signed)
Speech Language Pathology TBI Note  Patient Details  Name: Carl Carpenter MRN: [id number] Date of Birth: March 05, 1986  Today's Date: 06/03/2022 SLP Individual Time: 1030-1130 SLP Individual Time Calculation (min): 60 min  Short Term Goals: Week 4: SLP Short Term Goal 1 (Week 4): Pt will consume therapeutic trials of 1/2 teaspoons of thin liquids and ice chips with minimal overt s/s of aspiration over 3 consecutive sessions and max assist use of swallowing precautions prior to MBS or FEES. SLP Short Term Goal 2 (Week 4): Pt will vocalize on command in 25% of opportunities with max assist multimodal cues over 2 sessions. SLP Short Term Goal 3 (Week 4): Pt will answer basic yes/no questions using written choices on a white board, gestures, or eye gaze in 50% of opportunities with max assist multimodal cues over 2 sessions. SLP Short Term Goal 4 (Week 4): Pt will follow 1 step  commands in  50% of opportunities with max assist multimodal cues over 2 sessions. SLP Short Term Goal 5 (Week 4): Pt will demonstre sustained attention on a targeted stimulus for 2 minutes with max assist multimodal cues over 2 sessions.  Skilled Therapeutic Interventions: Skilled treatment session focused on cognitive, speech and dysphagia goals. Upon arrival, patient was awake in wheelchair and appeared restless. SLP facilitated session by providing Min A verbal and visual cues for patient to initiate oral care via the suction toothbrush. SLP completed for thoroughness After oral care, patient asked to smile and was able to produce X 3 on command. SLP administered trials of ice chips. Patient required Mod verbal and visual cues for bolus acceptance but demonstrated minimal oral manipulation resulting in consistent coughing. Recommend patient remain NPO. SLP also facilitated session by reclining the patient in his wheelchair to help facilitate tension as patient attempted to sit-up resulting in patient phonating X 3. However,  patient unable to phonate on command or response to automatic tasks or sequences despite Max A multimodal cues. SLP also utilized written choices from a field of 2 to establish orientation. Patient answered questions with 50% accuracy. Patient transferred back to bed at end of session via the [facility] with +3 assist for safety as patient attempting to push legs out of the stedy and with decreased ability to follow motor commands. Patient left supine in bed with family present. Continue with current plan of care.      Pain No/Denies Pain    Therapy/Group: Individual Therapy  Cathye Kreiter 06/03/2022, 1:05 PM

## 2022-06-03 NOTE — Progress Notes (Signed)
Physical Therapy Session Note  Patient Details  Name: Carl Carpenter MRN: [id number] Date of Birth: [birth year]  Today's Date: 06/03/2022 PT Individual Time: [id number] and 1525-1555 PT Individual Time Calculation (min): 41 min and 30 min  Short Term Goals: Week 4:  PT Short Term Goal 1 (Week 4): Pt will attend to therapy task for 5 minutes with max cues. PT Short Term Goal 2 (Week 4): Pt will completes bed to chair transfer with max/totalA +1. PT Short Term Goal 3 (Week 4): Patient with progress therapeutic gait >50 ft with max A +2 for increased activity tolerance and motor planning  Skilled Therapeutic Interventions/Progress Updates:     1st Session: Pt received supine in bed and in no apparent pain. Pt performs bilateral rolling with maxA +1 to don clean brief and shorts. Supine to sit with maxA +1 and cues for hand placement and sequencing. Pt demonstrates much more initiation with bed mobility than previous sessions! Pt requires minA/modA while seated at EOB due to posterior lean. Stand pivot transfer to [facility], with cues for initiation, anterior weight shift, power up, posture, sequencing, and hand placement when eccentrically controlling stand to sit. WC transport outside for mobility training over unlevel and varying surfaces. Pt performs sit to stand with modA +2. Rehab tech positioned under pt's L upper extremity and PT provides HHA on R with tactile facilitation of upright trunk posture. Pt ambulates x60' outdoors over unlevel surfaces, at times only requiring modA +2 and demonstrating good initiation with R lower extremity progression. With fatigue and more unlevel surfaces, pt requires maxA +2 and some assistance with progression of R leg. Manual facilitation provided for lateral weight shifting and navigation around obstacles. WC transport back to room. Left seated in tilt in space with alarm intact and all needs within reach.  2nd Session:  Pt received supine in bed and in no apparent  pain. Apparently pt had been very fidgety and trying to get out of bed prior to PT arrival. PT has discussion with pt's significant other regarding possibility for posey bed or lap belt. Supine to sit with maxA +1. Pt performs sit to stand and stand pivot to chair with maxA +2 and cues for initiation and sequencing. WC transport to gym for time management. Pt performs sit to stand with modA/maxA +2 HHA with cues for anterior trunk lean, initiation, and sequencing. Pt ambulates x40' with maxA +2 HHA but adjusting to 3 musketeers style to be able to facilitate upright posture. Manual facilitation provided for lateral weight shifting and pt requires ~75% assistance to progress R lower extremity. Following seated rest break, pt completes additonal 77' with same assist and cues. WC transport back to room. Stand pivot back to bed with maxA +2. Sit to supine with maxA +1, with pt lifting L leg back into bed without assistance. Pt also able to pull self up several inches in bed with bilateral grip on bed rail and bridging technique. Left supine with all needs within reach.  Therapy Documentation Precautions:  Precautions Precautions: Fall Precaution Comments: PEG with binder Restrictions Weight Bearing Restrictions: No  Therapy/Group: Individual Therapy  Breck Coons, PT, DPT 06/03/2022, 4:26 PM

## 2022-06-03 NOTE — Progress Notes (Signed)
Reached out to IR PA re: tube's laxity. She recommended cinching bumper to skin and taping it down. They will evaluate and give input in am. Nurse advised to shave hair round bumper so that tube can be taped down to skin. Significant other in room updated.

## 2022-06-03 NOTE — Progress Notes (Signed)
Nutrition Follow-up  DOCUMENTATION CODES:   Non-severe (moderate) malnutrition in context of acute illness/injury  INTERVENTION:   Continue Tube feed via PEG:  2 containers of Osmolite 1.5 - QID 45 mL ProSource TF - QID 50 mL free water before and after each feeding Provides: 3004 kcal, 164 grams protein, and 1448 mL free water (1848 mL total free water).   NUTRITION DIAGNOSIS:   Moderate Malnutrition related to acute illness as evidenced by moderate fat depletion, moderate muscle depletion. - Bring addressed via TF  GOAL:   Patient will meet greater than or equal to 90% of their needs - Being met via TF  MONITOR:   TF tolerance, Weight trends, I & O's  REASON FOR ASSESSMENT:   New TF    ASSESSMENT:   Pt with recent hospitalization 3/21 - 5/19 for severe TBI after [facility].  Pt father at bedside.  Father reports that he has not noticed any signs of intolerance at this time to TF formula change due to shortage. Father expressed that pt is constipated.  Spoke with RN outside of room, no signs of intolerance at this time. Volume does not seem to be bothering him today. RN confirms that pt had a BM yesterday.   RD to continue to follow.   Medications reviewed and include: Colace, Ferrous Sulfate, Senokot, Zinc Oxide Labs reviewed.   Diet Order:   Diet Order             Diet NPO time specified  Diet effective now                   EDUCATION NEEDS:   Not appropriate for education at this time  Skin:  Skin Assessment: Reviewed RN Assessment  Last BM:  6/13 - Type 4  Height:  Ht Readings from Last 1 Encounters:  05/11/22 _0  (1.956 m)   Weight:  Wt Readings from Last 1 Encounters:  06/03/22 103 kg   BMI:  Body mass index is 26.92 kg/m.  Estimated Nutritional Needs:  Kcal:  2800-3000 Protein:  150-170 grams Fluid:  >2.8 L/day   Hermina Barters RD, LDN Clinical Dietitian See [facility] for contact information.

## 2022-06-04 LAB — GLUCOSE, CAPILLARY
Glucose-Capillary: 101 mg/dL — ABNORMAL HIGH (ref 70–99)
Glucose-Capillary: 114 mg/dL — ABNORMAL HIGH (ref 70–99)
Glucose-Capillary: 116 mg/dL — ABNORMAL HIGH (ref 70–99)
Glucose-Capillary: 125 mg/dL — ABNORMAL HIGH (ref 70–99)
Glucose-Capillary: 94 mg/dL (ref 70–99)

## 2022-06-04 MED ORDER — CARBIDOPA-LEVODOPA 25-100 MG PO TABS
1.0000 | ORAL_TABLET | Freq: Three times a day (TID) | ORAL | Status: DC
  Administered 2022-06-04 (×2): 1 via ORAL
  Filled 2022-06-04 (×3): qty 1

## 2022-06-04 MED ORDER — VALPROIC ACID 250 MG/5ML PO SOLN
250.0000 mg | Freq: Two times a day (BID) | ORAL | Status: DC
  Administered 2022-06-04 – 2022-06-10 (×12): 250 mg
  Filled 2022-06-04 (×14): qty 5

## 2022-06-04 NOTE — Progress Notes (Addendum)
Physical Therapy Session Note  Patient Details  Name: Carl Carpenter MRN: [id number] Date of Birth: Apr 20, 1986  Today's Date: 06/04/2022 PT Individual Time: 1300-1410 PT Individual Time Calculation (min): 70 min   Short Term Goals: Week 4:  PT Short Term Goal 1 (Week 4): Pt will attend to therapy task for 5 minutes with max cues. PT Short Term Goal 2 (Week 4): Pt will completes bed to chair transfer with max/totalA +1. PT Short Term Goal 3 (Week 4): Patient with progress therapeutic gait >50 ft with max A +2 for increased activity tolerance and motor planning   Skilled Therapeutic Interventions/Progress Updates:  Patient in TIS w/c asleep with his father in the room upon PT arrival. Patient non-responsive to verbal or tactile stimulation, tilted w/c upright and provided noxious stimulus with slow arousal from patient with withdraw reflex. Patient participated throughout session, patient with unclear indicators of L knee pain, rubbing his knee x2 following gait training. LPN made aware.   Patient with improved attention with automatic functional tasks, gait and stairs, today. Patient able to verbalize "that went well" and "pee" during session. Unable to follow cues with increased time provided for yes/no responses, able to use the white board and point to yes/no x2 with patient affirming selection by re-selecting th same response. Patient was unsuccessful with toileting following patient request, however, once in lying, patient automatically returned to sitting EOB and was incontinent of bladder in his incontinence brief. Patient slid forward on the bed and transferred back to the chair to use the [facility] for peri-care and lower body clothing management. Patient demonstrated increased restlessness following first toileting trial. Required +3 assist to manage patient's safety due to patient grabbing and pushing with his arms and legs.  Therapeutic Activity: Bed Mobility: Patient performed supine  to/from sit with min A-CGA. Provided verbal cues for initiation and attention to R leg and arm. Transfers: Patient performed sit to/from stand x3 with mod A +2 and stand pivot TIS w/c<>bariatric BSC and TIS w/c <>bed with max A +2 for weight shift and R lower extremity management. Provided verbal cues for initiation and sequencing throughout with increased time for patient processing. Performed sit to/from stand in the [place] with min-max A +2 due to restlessness and pushing from patient while performing peri-care with total A from a third person following bladder incontinence. Stedy used to maintain static standing due to patient attempting to step in standing without AD.   High Intensity Gait Training:  Patient ambulated 114 feet using 3 Musketeer technique with mod A +2. Ambulated with independent L limb advancement and 25-50% assist for R limb advancement with leg loop, notable B extensor thrust R>L in stance with PT blocking R knee to prevent hyperextension, forward trunk bias with facilitation bilaterally and visual target to improved head control/position. Patient provided with visual target for goal distance and able to progress and stop to this target with mod cues.  Patient ascended/descended 4x6" steps using B rails with mod-max A +3 for safety. Performed step-to gait pattern leading with L while ascending and R while descending x3, L descending x1 with R knee buckle. Provided max cues for technique and sequencing and mod-max facilitation for R foot placement. Patient squeezed therapist's hand after with direct eye contact and eyes tearful. PT provided patient with encouragement for his success and progress, patient with a brief but obvious smile.   Patient in TIS w/c handed off to OT and SLP with his father in the room at end  of session.   Therapy Documentation Precautions:  Precautions Precautions: Fall Precaution Comments: PEG with binder Restrictions Weight Bearing Restrictions:  No Agitated Behavior Scale: TBI Observation Details Observation Environment: CIR Start of observation period - Date: 06/04/22 Start of observation period - Time: 1420 End of observation period - Date: 06/04/22 End of observation period - Time: 1520 Agitated Behavior Scale (DO NOT LEAVE BLANKS) Short attention span, easy distractibility, inability to concentrate: Present to a moderate degree Impulsive, impatient, low tolerance for pain or frustration: Present to an moderate degree Uncooperative, resistant to care, demanding: Absent Violent and/or threatening violence toward people or property: Absent Explosive and/or unpredictable anger: Absent Rocking, rubbing, moaning, or other self-stimulating behavior: Present to a moderate degree Pulling at tubes, restraints, etc.: Absent Wandering from treatment areas: Absent Restlessness, pacing, excessive movement: Present to a moderate degree Repetitive behaviors, motor, and/or verbal: Present to a slight degree Rapid, loud, or excessive talking: Absent Sudden changes of mood: Absent Easily initiated or excessive crying and/or laughter: Absent Self-abusiveness, physical and/or verbal: Absent Agitated behavior scale total score: 23  Therapy/Group: Individual Therapy  Camara Renstrom L Sahvannah Rieser PT, DPT  06/04/2022, 3:45 PM

## 2022-06-04 NOTE — Progress Notes (Signed)
Occupational Therapy TBI Note  Patient Details  Name: Carl Carpenter MRN: [id number] Date of Birth: 1986/01/05  Today's Date: 06/04/2022 OT Individual Time: 2025-4270 OT Individual Time Calculation (min): 55 min    Short Term Goals: Week 4:  OT Short Term Goal 1 (Week 4): Pt will sit EOB with no more than mod A consistently OT Short Term Goal 2 (Week 4): Pt's caregivers will initiate transfer training in prep for d/c OT Short Term Goal 3 (Week 4): Pt will complete oral care with mod A  Skilled Therapeutic Interventions/Progress Updates:  Pt greeted supine in bed with fiance present. Pt mildly restless wanting to get OOB. Pt completed supine>sit with MAX A +2, MOD A for static sitting balance. Donned pants from EOB with pt needing assist to thread pants but was able to assist with pulling pants to waist line with initial hand over hand assist. Utilized stedy for sit>stand with pt able to stand to stedy with MIN A+2. Total A to fully pull pants to waist line. Total A transport to TIS in stedy. Pt transported to gym in w/c with total A; MAX A +2 for squat pivot to EOM.  Worked on attention, awareness and initiation from EOM with pt needing MAX A for static sitting balance while pt instructed to choose from two picture options I.e which one is "Larnie." Pt able to accurately gesture to correct photo ~ 80% of time with pt either pointing to correct photo or nodding yes/no.  Pt abel to nod yes when asked if pt was ready to return to w/c. MAX A +2 for squat pivot to w/c to L side, total A transport back to room where pt left up in w/c with family present.    Therapy Documentation Precautions:  Precautions Precautions: Fall Precaution Comments: PEG with binder Restrictions Weight Bearing Restrictions: No  Pain: no pain indications noted during session   Therapy/Group: Individual Therapy  Pollyann [facility] 06/04/2022, 12:07 PM

## 2022-06-04 NOTE — Progress Notes (Signed)
Physical Therapy Weekly Progress Note  Patient Details  Name: Carl Carpenter MRN: [id number] Date of Birth: 1986-09-04  Beginning of progress report period: May 28, 2022 End of progress report period: June 04, 2022  Today's Date: 06/04/2022 PT Individual Time: 1120-1200 PT Individual Time Calculation (min): 40 min   Patient has met 2 of 3 short term goals.  Pt is progressing well toward mobility goals, improving attention, independence with bed mobility, transfers, sitting balance, and ambulation. Pt ambulating up to 100' with maxA +2 with 3 musketeers technique and demonstrating improved initiation on R side. Pt has had a family meeting to discuss progress and prognosis for mobility following discharge, but will benefit from hands on family training prior to discharge.  Patient continues to demonstrate the following deficits muscle weakness, decreased cardiorespiratoy endurance, motor apraxia, decreased coordination, and decreased motor planning, decreased initiation, decreased attention, decreased awareness, decreased problem solving, decreased safety awareness, decreased memory, and delayed processing, and decreased sitting balance, decreased standing balance, decreased postural control, and decreased balance strategies and therefore will continue to benefit from skilled PT intervention to increase functional independence with mobility.  Patient progressing toward long term goals..  Continue plan of care.  PT Short Term Goals Week 4:  PT Short Term Goal 1 (Week 4): Pt will attend to therapy task for 5 minutes with max cues. PT Short Term Goal 1 - Progress (Week 4): Progressing toward goal PT Short Term Goal 2 (Week 4): Pt will completes bed to chair transfer with max/totalA +1. PT Short Term Goal 2 - Progress (Week 4): Met PT Short Term Goal 3 (Week 4): Patient with progress therapeutic gait >50 ft with max A +2 for increased activity tolerance and motor planning PT Short Term Goal 3 -  Progress (Week 4): Met Week 5:  PT Short Term Goal 1 (Week 5): Pt will complete bed mobility with modA +1. PT Short Term Goal 2 (Week 5): Pt will complete bed to chair transfer with modA +1. PT Short Term Goal 3 (Week 5): Pt will ambulate x100' with modA +2 and LRAD.  Skilled Therapeutic Interventions/Progress Updates:  Ambulation/gait training;Discharge planning;Functional mobility training;Psychosocial support;Therapeutic Activities;Visual/perceptual remediation/compensation;Balance/vestibular training;Disease management/prevention;Neuromuscular re-education;Skin care/wound management;Therapeutic Exercise;Wheelchair propulsion/positioning;Cognitive remediation/compensation;DME/adaptive equipment instruction;Pain management;Splinting/orthotics;UE/LE Strength taining/ROM;Community reintegration;Functional electrical stimulation;Patient/family education;Stair training;UE/LE Coordination activities   Pt received seated in WC and in no apparent distress or pain. WC transport to gym for energy conservation. Pt performs sit to stand with maxA +2 and multimodal cues for initiation, trunk lean, weight shifting, and sequencing. Pt ambulates x100' with maxA +2 3 musketeers technique with manual facilitation of lateral weight shifting, upright trunk and cervical posture, hip extension. Pt initiates progression of R lower extremity >75% of gait cycle, with PT providing tactile facilitation at R hip. Pt takes seated rest break. Pt attempts ambulation for 2nd bout and requires maxA +2, but only ambulates x10' prior to requiring seated rest break, with increased difficulty with initiation and coordination, appearing very fatigued from previous bout. Pt given more time to rest before attempting gait for 3rd time, this time ambulating 100' with same assist, but requiring increased assistance with initiation of R lower extremity, ~50% of gait cycle. Pt left seated in WC with alarm intact and all needs within  reach.  Therapy Documentation Precautions:  Precautions Precautions: Fall Precaution Comments: PEG with binder Restrictions Weight Bearing Restrictions: No   Therapy/Group: Individual Therapy  Breck Coons, PT, DPT 06/04/2022, 12:06 PM

## 2022-06-04 NOTE — Progress Notes (Addendum)
PROGRESS NOTE   Subjective/Complaints: Pt is engaging more with family and staff. Words are more frequent. He is more mobile and is making attempts to get out of bed. More restless  ROS: Limited due to cognitive/behavioral   Objective:   DG Abd Portable 1V  Result Date: 06/03/2022 CLINICAL DATA:  Check percutaneous gastrostomy tube EXAM: PORTABLE ABDOMEN - 1 VIEW COMPARISON:  05/19/2022 FINDINGS: Tip percutaneous gastrostomy tube is seen in the fundus of the stomach. Bowel gas pattern is nonspecific. Small amount of stool is seen in the colon. Surgical clips are noted in the right lower quadrant. Visualized lower lung fields are clear. Degenerative changes are noted in the lumbar spine. IMPRESSION: Tip of gastrostomy tube is seen in the region of fundus of the stomach. Nonspecific bowel gas pattern. Electronically Signed   By: Ernie Avena M.D.   On: 06/03/2022 15:40   No results for input(s): "WBC", "HGB", "HCT", "PLT" in the last 72 hours.     No results for input(s): "NA", "K", "CL", "CO2", "GLUCOSE", "BUN", "CREATININE", "CALCIUM" in the last 72 hours.      Intake/Output Summary (Last 24 hours) at 06/04/2022 1054 Last data filed at 06/04/2022 0700 Gross per 24 hour  Intake --  Output 500 ml  Net -500 ml           Physical Exam: Vital Signs Blood pressure 119/78, pulse 86, temperature 97.8 F (36.6 C), resp. rate 18, height 6\' 5"  (1.956 m), weight 103 kg, SpO2 96 %.  Constitutional: No distress . Vital signs reviewed. HEENT: NCAT, EOMI, oral membranes moist Neck: supple Cardiovascular: RRR without murmur. No JVD    Respiratory/Chest: CTA Bilaterally without wheezes or rales. Normal effort    GI/Abdomen: BS +, non-tender, non-distended Ext: no clubbing, cyanosis, or edema Psych: more engaging and restless too Musculoskeletal:        no pain with ROM  Skin:    Comments: left knee abrasion with  scar Neurological:     Comments: vocalizing more, often moans/grunts. Engaging more automatically with right side.    DTR's brisk on right.   Possible mild flexor tone in right hand.  Trying to swing right leg to get out of bed  Assessment/Plan: 1. Functional deficits which require 3+ hours per day of interdisciplinary therapy in a comprehensive inpatient rehab setting. Physiatrist is providing close team supervision and 24 hour management of active medical problems listed below. Physiatrist and rehab team continue to assess barriers to discharge/monitor patient progress toward functional and medical goals  Care Tool:  Bathing    Body parts bathed by patient: Chest, Face   Body parts bathed by helper: Right arm, Left arm, Abdomen, Right upper leg, Left upper leg     Bathing assist Assist Level: 2 Helpers (MAX A +2)     Upper Body Dressing/Undressing Upper body dressing   What is the patient wearing?: Pull over shirt    Upper body assist Assist Level: Total Assistance - Patient < 25%    Lower Body Dressing/Undressing Lower body dressing      What is the patient wearing?: Underwear/pull up, Pants     Lower body assist Assist for lower body  dressing: 2 Helpers (MAX A +2)     Toileting Toileting    Toileting assist Assist for toileting: 2 Helpers     Transfers Chair/bed transfer  Transfers assist  Chair/bed transfer activity did not occur: Safety/medical concerns  Chair/bed transfer assist level: 2 Helpers     Locomotion Ambulation   Ambulation assist   Ambulation activity did not occur: Safety/medical concerns          Walk 10 feet activity   Assist  Walk 10 feet activity did not occur: Safety/medical concerns        Walk 50 feet activity   Assist Walk 50 feet with 2 turns activity did not occur: Safety/medical concerns         Walk 150 feet activity   Assist Walk 150 feet activity did not occur: Safety/medical concerns          Walk 10 feet on uneven surface  activity   Assist Walk 10 feet on uneven surfaces activity did not occur: Safety/medical concerns         Wheelchair     Assist Is the patient using a wheelchair?: Yes Type of Wheelchair: Manual    Wheelchair assist level: Dependent - Patient 0%      Wheelchair 50 feet with 2 turns activity    Assist        Assist Level: Dependent - Patient 0%   Wheelchair 150 feet activity     Assist      Assist Level: Dependent - Patient 0%   Blood pressure 119/78, pulse 86, temperature 97.8 F (36.6 C), resp. rate 18, height 6\' 5"  (1.956 m), weight 103 kg, SpO2 96 %.    Medical Problem List and Plan: 1. 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  DVT right posterior tibial and peroneal veins (03/22/22): Discussed with mother that repeat doppler shows no new clots and resolution of one previous clot.  -anticoagulation: continue Eliquis             -antiplatelet therapy: none 3. Pain: continueTylenol prn 4. Mood/sleep: LCSW to evaluate and provide emotional support             -antipsychotic agents: none  -increasing propranolol for restlessness  -continue low dose seroquel at night for restlessness/agitation  -add valproic acid 250mg  bid, titrate up as tolerated/needed  5. Impaired attention/cognition: This patient is not capable of making decisions on his own behalf.             - ritalin  25mg  bid -continue  -6/13 to improve initiation/apraxia--will try sinemet 10/100  6/15- adjust sinemet to 25/100   -dc amantadine      -there have been notable improvements  in initiation since sinemet was initiated.                6. Dysphagia. S/p PEG 3/29.  --NPO -continue TF/boluses -Continue zinc oxide to protect skin surrounding PEG tube -5/30 replaced PEG  under fluoro. Appreciate IR help -PEG site remains clean. Bumper is loose on tube. Just needs to be cinched back down. They can also tape it as well. A rubber band might work better. 7. Fluids/Electrolytes/Nutrition: Routine Is and Os and follow-up chemistries             --continue tube feeds Jevity 1.5 boluses             --continue Prosource             - prealbumin 35.8, gaining weight Filed Weights   06/02/22 0405 06/03/22 0411 06/04/22 0416  Weight: 101 kg 103 kg 103 kg    9: C1 right transverse process fracture--he's out of collar 10: Left Occipital condyle fracture--out of collar 11: Acute ventilator respiratory failure requiring trach and PEG 3/29              --decannulated , stoma closed             --changed Robitussin to 25 mL qid---will dc 6/13  -discontinued robinul via PEG      12: Right ear laceration:  healing 14: Tongue laceration: monitor healing 15: Hypertension: lopressor 50mg  bid      -hr controlled on propranolol 30mg  tid  6/15- BP controlled- con't regimen    06/04/2022    4:16 AM 06/03/2022    8:14 PM 06/03/2022    2:07 PM  Vitals with BMI  Weight 227 lbs    BMI 26.91    Systolic 119 121 06/05/2022  Diastolic 78 75 79  Pulse 86 91 105    16. Hypoglycemic to 69: otherwise in 90s: will d/c ISS since not requiring   18. Iron deficiency:  continue daily iron supplement  19. Tremor,?clonus: continue trial of propranolol 10mg  tid  Increased propranolol to 30 mg tid     -see above 20, Dispo  6/13 pt may have grounds pass with staff then family  LOS: 27 days A FACE TO FACE EVALUATION WAS PERFORMED  607 06/04/2022, 10:54 AM

## 2022-06-04 NOTE — Progress Notes (Signed)
Physical Therapy Session Note  Patient Details  Name: Carl Carpenter MRN: [id number] Date of Birth: 1986-08-07  Today's Date: 06/04/2022 PT Individual Time: 1601-1630 PT Individual Time Calculation (min): 29 min   Short Term Goals: Week 5:  PT Short Term Goal 1 (Week 5): Pt will complete bed mobility with modA +1. PT Short Term Goal 2 (Week 5): Pt will complete bed to chair transfer with modA +1. PT Short Term Goal 3 (Week 5): Pt will ambulate x100' with modA +2 and LRAD.  Skilled Therapeutic Interventions/Progress Updates:     PT asked by RN to see pt due to agitation and difficulty mobilizing pt. Upon PT entry into room, pt seated in tilt in space WC appearing distressed and 5-6 RN staff in room. PT requests that all RN staff exit at this time to limit stimulation in hopes of calming pt. Blinds closed and lights off to decrease stimulation, and PT speaks to pt in calming tones. Pt performs stand pivot transfer from [facility] to bed with maxA +2, with cues for hand placement, body mechanics, and sequencing. Sit to supine with modA +2 and pt able to lift L leg into bed without assistance. Pt continues to sit up in bed on his own, grabbing onto rails and attempting to lift legs. PT is able to gently redirect pt and guide back to supine. Cool washcloth used on forehead. Pt eventually appears to calm down. PT provides pt's father with print out of [place] levels and provides explanation of pt's current behavior and Level IV presentation. Pt left supine with alarm intact and call bell within reach.  Therapy Documentation Precautions:  Precautions Precautions: Fall Precaution Comments: PEG with binder Restrictions Weight Bearing Restrictions: No    Therapy/Group: Individual Therapy  [name], PT, DPT 06/04/2022, 5:54 PM

## 2022-06-04 NOTE — Progress Notes (Signed)
Speech Language Pathology TBI Note  Patient Details  Name: Carl Carpenter MRN: [id number] Date of Birth: 02-06-1986  Today's Date: 06/04/2022 SLP Individual Time: 1420-1520 SLP Individual Time Calculation (min): 60 min  Short Term Goals: Week 4: SLP Short Term Goal 1 (Week 4): Pt will consume therapeutic trials of 1/2 teaspoons of thin liquids and ice chips with minimal overt s/s of aspiration over 3 consecutive sessions and max assist use of swallowing precautions prior to MBS or FEES. SLP Short Term Goal 2 (Week 4): Pt will vocalize on command in 25% of opportunities with max assist multimodal cues over 2 sessions. SLP Short Term Goal 3 (Week 4): Pt will answer basic yes/no questions using written choices on a white board, gestures, or eye gaze in 50% of opportunities with max assist multimodal cues over 2 sessions. SLP Short Term Goal 4 (Week 4): Pt will follow 1 step  commands in  50% of opportunities with max assist multimodal cues over 2 sessions. SLP Short Term Goal 5 (Week 4): Pt will demonstre sustained attention on a targeted stimulus for 2 minutes with max assist multimodal cues over 2 sessions.  Skilled Therapeutic Interventions: Skilled treatment session focused on dysphagia and cognitive goals. Upon arrival, patient was extremely restless while upright in the wheelchair. SLP facilitated session by providing Min verbal and tactile cues for oral care via the suction toothbrush. SLP administered trials of ice chips with patient demonstrating minimal oral manipulation and extremely delayed swallow initiation resulting in overt coughing. Patient appeared diaphoretic, vitals taken and [facility]. Patient placed on commode via the [facility] for possible bowel movement. Patient unsuccessful with total A multimodal cues needed for attention and problem solving throughout task due to restlessness and impulsivity. Attempted to transfer patient to bed at end of session to rest due to restlessness, however,  patient attempting to get out of bed, therefore, patient transferred back to the wheelchair for safety. Throughout session, patient offered multiple opportunities to verbalize at the word level regarding wants/needs without success. Patient left upright in the wheelchair with belt on and family present. Continue with current plan of care.      Pain No indications of pain  Agitated Behavior Scale: TBI Observation Details Observation Environment: CIR Start of observation period - Date: 06/04/22 Start of observation period - Time: 1420 End of observation period - Date: 06/04/22 End of observation period - Time: 1520 Agitated Behavior Scale (DO NOT LEAVE BLANKS) Short attention span, easy distractibility, inability to concentrate: Present to an extreme degree Impulsive, impatient, low tolerance for pain or frustration: Present to an extreme degree Uncooperative, resistant to care, demanding: Present to a moderate degree Violent and/or threatening violence toward people or property: Absent Explosive and/or unpredictable anger: Absent Rocking, rubbing, moaning, or other self-stimulating behavior: Present to a moderate degree Pulling at tubes, restraints, etc.: Present to a slight degree Wandering from treatment areas: Present to a moderate degree Restlessness, pacing, excessive movement: Present to an extreme degree Repetitive behaviors, motor, and/or verbal: Present to an extreme degree Rapid, loud, or excessive talking: Absent Sudden changes of mood: Absent Easily initiated or excessive crying and/or laughter: Absent Self-abusiveness, physical and/or verbal: Absent Agitated behavior scale total score: 33  Therapy/Group: Individual Therapy  Carl Carpenter 06/04/2022, 3:48 PM
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
## 2022-06-04 NOTE — Progress Notes (Signed)
Pt seems to be having a hard time having a bm. Family would like to have him on a higher dose of stool softener.   Keeli Roberg [place]

## 2022-06-05 LAB — GLUCOSE, CAPILLARY
Glucose-Capillary: 101 mg/dL — ABNORMAL HIGH (ref 70–99)
Glucose-Capillary: 105 mg/dL — ABNORMAL HIGH (ref 70–99)
Glucose-Capillary: 108 mg/dL — ABNORMAL HIGH (ref 70–99)
Glucose-Capillary: 112 mg/dL — ABNORMAL HIGH (ref 70–99)
Glucose-Capillary: 97 mg/dL (ref 70–99)
Glucose-Capillary: 97 mg/dL (ref 70–99)

## 2022-06-05 MED ORDER — SENNOSIDES-DOCUSATE SODIUM 8.6-50 MG PO TABS
1.0000 | ORAL_TABLET | Freq: Every day | ORAL | Status: DC
Start: 2022-06-05 — End: 2022-06-05

## 2022-06-05 MED ORDER — QUETIAPINE FUMARATE 25 MG PO TABS: 25.0000 mg | ORAL_TABLET | Freq: Two times a day (BID) | ORAL | Status: DC | PRN

## 2022-06-05 MED ORDER — CARBIDOPA-LEVODOPA 25-100 MG PO TABS
1.0000 | ORAL_TABLET | Freq: Three times a day (TID) | ORAL | Status: DC
Start: 2022-06-05 — End: 2022-06-11
  Administered 2022-06-05 – 2022-06-11 (×19): 1
  Filled 2022-06-05 (×20): qty 1

## 2022-06-05 MED ORDER — SENNOSIDES-DOCUSATE SODIUM 8.6-50 MG PO TABS
1.0000 | ORAL_TABLET | Freq: Two times a day (BID) | ORAL | Status: DC
Start: 2022-06-05 — End: 2022-06-26
  Administered 2022-06-05 – 2022-06-25 (×41): 1
  Filled 2022-06-05 (×42): qty 1

## 2022-06-05 MED ORDER — QUETIAPINE FUMARATE 25 MG PO TABS
25.0000 mg | ORAL_TABLET | Freq: Every day | ORAL | Status: DC
  Administered 2022-06-05 – 2022-06-29 (×25): 25 mg
  Filled 2022-06-05 (×27): qty 1

## 2022-06-05 NOTE — Progress Notes (Signed)
Speech Language Pathology Weekly Progress and Session Note  Patient Details  Name: Carl Carpenter MRN: [id number] Date of Birth: 21-Oct-1986  Beginning of progress report period: May 29, 2022 End of progress report period: June 05, 2022  Today's Date: 06/05/2022 SLP Individual Time: 1300-1400 SLP Individual Time Calculation (min): 60 min  Short Term Goals: Week 4: SLP Short Term Goal 1 (Week 4): Pt will consume therapeutic trials of 1/2 teaspoons of thin liquids and ice chips with minimal overt s/s of aspiration over 3 consecutive sessions and max assist use of swallowing precautions prior to MBS or FEES. SLP Short Term Goal 1 - Progress (Week 4): Not met SLP Short Term Goal 2 (Week 4): Pt will vocalize on command in 25% of opportunities with max assist multimodal cues over 2 sessions. SLP Short Term Goal 2 - Progress (Week 4): Not met SLP Short Term Goal 3 (Week 4): Pt will answer basic yes/no questions using written choices on a white board, gestures, or eye gaze in 50% of opportunities with max assist multimodal cues over 2 sessions. SLP Short Term Goal 3 - Progress (Week 4): Not met SLP Short Term Goal 4 (Week 4): Pt will follow 1 step  commands in  50% of opportunities with max assist multimodal cues over 2 sessions. SLP Short Term Goal 4 - Progress (Week 4): Met SLP Short Term Goal 5 (Week 4): Pt will demonstre sustained attention on a targeted stimulus for 2 minutes with max assist multimodal cues over 2 sessions. SLP Short Term Goal 5 - Progress (Week 4): Not met    New Short Term Goals: Week 5: SLP Short Term Goal 1 (Week 5): Pt will consume therapeutic trials of 1/2 teaspoons of thin liquids and ice chips with minimal overt s/s of aspiration over 3 consecutive sessions and max assist use of swallowing precautions prior to MBS or FEES. SLP Short Term Goal 2 (Week 5): Pt will verbalize on command in 25% of opportunities with max assist multimodal when given chocies from a field of  2. SLP Short Term Goal 3 (Week 5): Pt will answer basic yes/no questions using via gestures or verbalizations in 50% of opportunities with max assist multimodal cues over 2 sessions. SLP Short Term Goal 4 (Week 5): Pt will demonstre sustained attention on a targeted stimulus for 2 minutes with max assist multimodal cues over 2 sessions. SLP Short Term Goal 5 (Week 5): Pt will follow 1 step  commands in 75% of opportunities with max assist multimodal cues over 2 sessions. SLP Short Term Goal 6 (Week 5): Patient will initiate functional tasks with Mod A multimodal cues.  Weekly Progress Updates: Patient has made functional gains and has met 1 of 5 STGs this reporting period. Currently, patient is demonstrating behaviors consistent with a Rancho Level IV characterized by severe restlessness and impulsivity requiring 2 people consistently for safety. Max-Total A multimodal cues are needed for initiation, sustained attention and problem solving with basic and familiar tasks. Patient demonstrates difficulty verbalizing on command but has been able to verbally respond to basic questions at the word level when given a choice of 2 and with automatic verbal output intermittently. Patient is consuming ongoing trials with consistent overt s/s of aspiration due to poor attention to bolus. However, recommend MBS early next week to assess swallow function. Patient and family education ongoing. Patient would benefit from continued skilled SLP intervention to maximize his cognitive and swallowing function as well as his functional communication prior to discharge.  Intensity: Minumum of 1-2 x/day, 30 to 90 minutes Frequency: 3 to 5 out of 7 days Duration/Length of Stay: 06/16/22 Treatment/Interventions: Cognitive remediation/compensation;Cueing hierarchy;Dysphagia/aspiration precaution training;Environmental controls;Internal/external aids;Speech/Language facilitation;Patient/family education;Functional  tasks;Multimodal communication approach;Therapeutic Activities   Daily Session  Skilled Therapeutic Interventions:  Skilled treatment session focused on dysphagia and cognitive goals. Upon arrival, patient was asleep in bed. Patient sat EOB with total A and performed a squat pivot (with PT) to the wheelchair to maximize arousal and alertness. SLP facilitated session by providing extra time and overall Mod-Max A verbal and tactile cues for initiation with basic self-care tasks at the sink. Patient able to maintain attention to task for ~30 seconds. Patient administered trials of ice chips with patient able to orally accept with mildly prolonged AP transit noted. Overt s/s of aspiration observed. Recommend ongoing trials with SLP. When asked yes/no responses, patient verbalized in 25% of opportunities. Patient also verbalized, "pretty nice" and "lets go" frequently throughout session due to increased restless that seemed to improve when being pushed around the unit. SLP educated the patient's significant other regarding pushing the patient around the unit to help decrease restlessness. She verbalized understanding. Patient left upright in wheelchair with significant other present. Continue with current plan of care.     Pain No/Denies Pain   Therapy/Group: Individual Therapy  Linzee Depaul 06/05/2022, 6:52 AM

## 2022-06-05 NOTE — Progress Notes (Signed)
Physical Therapy Session Note  Patient Details  Name: Carl Carpenter MRN: [id number] Date of Birth: 1986-10-31  Today's Date: 06/05/2022 PT Individual Time: 1502-1600 PT Individual Time Calculation (min): 58 min   Short Term Goals: Week 5:  PT Short Term Goal 1 (Week 5): Pt will complete bed mobility with modA +1. PT Short Term Goal 2 (Week 5): Pt will complete bed to chair transfer with modA +1. PT Short Term Goal 3 (Week 5): Pt will ambulate x100' with modA +2 and LRAD.  Skilled Therapeutic Interventions/Progress Updates:     Pt received seated in WC and in no apparent distress or pain. WC transport to gym for time management. Pt performs sit to stand with modA +2 with 3 musketeers technique and verbal and tactile cueing for anterior trunk lean, initiation, and sequencing. Pt ambulates x75' with mod A +2 and +3 for WC follow for safety. Pt able to initiate steps on R without manual assistance, though PT does provide tactile facilitation at R hip to promote proper gait mechanics. Multimodal cueing provided for lateral weight shifting, upright posture, and reciprocal gait pattern. Pt noted to have soiled brief with urine so taken back to room in [facility]. Pt performs sit to stand in [name] with modA +1. Pt requires modA to maintain balance in stedy due to lack of trunk control. PT assists with doffing brief and performing hygiene, then donning clean brief and shorts. Pt able to initiate assistance with donning shirt and shorts. Pt taken back to gym and ambulates additional bout of 100' with similar cueing and assistance.  Pt performs stand pivot transfer to mat table with modA +2. Seated at edge of mat in short sitting, with modA for sitting balance, pt participates in ball kicking activity and is able to consistently kick ball with L lower extremity, and ~25% of time with R lower extremity. Performed for coordination training as well as attention to task. Pt then performs similar task with ball tossing  and catching. Pt is able to catch ball ~50% of time and toss back to PT on command, with hand over hand cueing to hold ball effectively.   Pt performs stand pivot transfer from mat>WC>bed with modA +2. MaxA +2 for sit to supine. Pt left supine with alarm intact and all needs within reach.  Therapy Documentation Precautions:  Precautions Precautions: Fall Precaution Comments: PEG with binder Restrictions Weight Bearing Restrictions: No   Therapy/Group: Individual Therapy  [name], PT, DPT 06/05/2022, 5:40 PM

## 2022-06-05 NOTE — Plan of Care (Signed)
Behavioral Plan   Rancho Level: IV  Behavior to decrease/ eliminate:   -Restlessness -Impulsivity  -Agitation   Changes to environment:   -No more than 3 people in the room at a time, especially when attempting movement/transfers -Only one person giving directions at at time -No TV; can use music instead -When restless, patient is the safest in the tilt-in-space wheelchair with it tilted back and seatbelt in place  -Family/Staff can roll patient around unit (DO NOT go past double doors) -When restless, provide a low stim environment (blinds closed, lights off, TV off, door closed if family present)    Interventions:  -Bed Alarm -4 Side rails up -(May need waist restraint in future)  Recommendations for interactions with patient:  -If/When patient becomes restless, check for incontinence first and then ask if patient needs to be toileted -When interacting, try not to stand over him, sit next or in front of him instead. -Right side is weaker, so may want to stay on that side -Can wear gloves so if patient has tight grip on hand, can slide out easier  -Patient is starting to verbalize, offer choice from a field of 2  (do you to go to the bed or chair?) and encourage yes/no responses -Allow extra time for patient to process and initiate with all tasks   Attendees:    Feliberto Gottron, SLP Barron Schmid, OT Malachi Pro, PT

## 2022-06-05 NOTE — Progress Notes (Addendum)
PROGRESS NOTE   Subjective/Complaints: Nurse reports constipation. I see record of stool the last few days. Unclear of amount however. Pt more restless and agitated, particularly later in day.did sleep fairly well  ROS: Limited due to cognitive/behavioral  Objective:   DG Abd Portable 1V  Result Date: 06/03/2022 CLINICAL DATA:  Check percutaneous gastrostomy tube EXAM: PORTABLE ABDOMEN - 1 VIEW COMPARISON:  05/19/2022 FINDINGS: Tip percutaneous gastrostomy tube is seen in the fundus of the stomach. Bowel gas pattern is nonspecific. Small amount of stool is seen in the colon. Surgical clips are noted in the right lower quadrant. Visualized lower lung fields are clear. Degenerative changes are noted in the lumbar spine. IMPRESSION: Tip of gastrostomy tube is seen in the region of fundus of the stomach. Nonspecific bowel gas pattern. Electronically Signed   By: Ernie Avena M.D.   On: 06/03/2022 15:40   No results for input(s): "WBC", "HGB", "HCT", "PLT" in the last 72 hours.     No results for input(s): "NA", "K", "CL", "CO2", "GLUCOSE", "BUN", "CREATININE", "CALCIUM" in the last 72 hours.     No intake or output data in the 24 hours ending 06/05/22 1041          Physical Exam: Vital Signs Blood pressure 107/71, pulse 79, temperature 98.4 F (36.9 C), temperature source Oral, resp. rate 17, height 6\' 5"  (1.956 m), weight 103.6 kg, SpO2 96 %.  Constitutional: No distress . Vital signs reviewed. HEENT: NCAT, EOMI, oral membranes moist Neck: supple Cardiovascular: RRR without murmur. No JVD    Respiratory/Chest: CTA Bilaterally without wheezes or rales. Normal effort    GI/Abdomen: BS +, non-tender, non-distended Ext: no clubbing, cyanosis, or edema Psych: restless, non-agitated Musculoskeletal:        no pain with ROM  Skin:    Comments: left knee abrasion with scar Neurological:     Comments: making eye  contact, restless. Often moves mouth as if he's going to speak.. Moving right side spontaneously.    DTR's brisk on right.   Possible mild flexor tone in right hand.     Assessment/Plan: 1. Functional deficits which require 3+ hours per day of interdisciplinary therapy in a comprehensive inpatient rehab setting. Physiatrist is providing close team supervision and 24 hour management of active medical problems listed below. Physiatrist and rehab team continue to assess barriers to discharge/monitor patient progress toward functional and medical goals  Care Tool:  Bathing    Body parts bathed by patient: Chest, Face   Body parts bathed by helper: Right arm, Left arm, Abdomen, Right upper leg, Left upper leg     Bathing assist Assist Level: 2 Helpers (MAX A +2)     Upper Body Dressing/Undressing Upper body dressing   What is the patient wearing?: Pull over shirt    Upper body assist Assist Level: Total Assistance - Patient < 25%    Lower Body Dressing/Undressing Lower body dressing      What is the patient wearing?: Pants     Lower body assist Assist for lower body dressing: 2 Helpers (MAX A +2 with stedy)     Toileting Toileting    Toileting assist Assist for toileting:  2 Helpers     Transfers Chair/bed transfer  Transfers assist  Chair/bed transfer activity did not occur: Safety/medical concerns  Chair/bed transfer assist level: 2 Helpers     Locomotion Ambulation   Ambulation assist   Ambulation activity did not occur: Safety/medical concerns          Walk 10 feet activity   Assist  Walk 10 feet activity did not occur: Safety/medical concerns        Walk 50 feet activity   Assist Walk 50 feet with 2 turns activity did not occur: Safety/medical concerns         Walk 150 feet activity   Assist Walk 150 feet activity did not occur: Safety/medical concerns         Walk 10 feet on uneven surface  activity   Assist Walk 10 feet on  uneven surfaces activity did not occur: Safety/medical concerns         Wheelchair     Assist Is the patient using a wheelchair?: Yes Type of Wheelchair: Manual    Wheelchair assist level: Dependent - Patient 0%      Wheelchair 50 feet with 2 turns activity    Assist        Assist Level: Dependent - Patient 0%   Wheelchair 150 feet activity     Assist      Assist Level: Dependent - Patient 0%   Blood pressure 107/71, pulse 79, temperature 98.4 F (36.9 C), temperature source Oral, resp. rate 17, height 6\' 5"  (1.956 m), weight 103.6 kg, SpO2 96 %.    Medical Problem List and Plan: 1. 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  DVT right posterior tibial and peroneal veins (03/22/22): Discussed with mother that repeat doppler shows no new clots and resolution of one previous clot.  -anticoagulation: continue Eliquis             -antiplatelet therapy: none 3. Pain: continueTylenol prn 4. Behavior/sleep: LCSW to evaluate and provide emotional support             -antipsychotic agents: none  -increasing propranolol for restlessness  -continue low dose seroquel at night for restlessness/agitation  -added valproic acid 250mg  bid 6/15, titrate up as tolerated/needed---max titration would be around 500mg  tid  -if he becomes more agitated this coming week, seroquel dose and frequency could be increased  5. Impaired attention/cognition: This patient is not capable of making decisions on his own behalf.             - ritalin  25mg  bid -continue  6/16- adjusted sinemet to 25/100 yesterday   -stopped  amantadine      -there have been notable improvements in initiation since sinemet was initiated this week. Not sure I would tolerate this much further               6. Dysphagia. S/p PEG 3/29.  --NPO -continue TF/boluses -Continue zinc oxide to protect  skin surrounding PEG tube -5/30 replaced PEG under fluoro. Appreciate IR help -PEG site remains clean. Bumper has been loose on tube. Just needs to be cinched back down. They can also tape it as well. A rubber band might work better. 7. Fluids/Electrolytes/Nutrition: Routine Is and Os and follow-up chemistries             --continue tube feeds Jevity 1.5 boluses             --continue Prosource             - prealbumin 35.8, gaining weight Filed Weights   06/03/22 0411 06/04/22 0416 06/05/22 0404  Weight: 103 kg 103 kg 103.6 kg    9: C1 right transverse process fracture--he's out of collar 10: Left Occipital condyle fracture--out of collar 11: Acute ventilator respiratory failure requiring trach and PEG 3/29              --decannulated , stoma closed             --robitussin can be prn  -discontinued robinul via PEG      12: Right ear laceration:  healing 14: Tongue laceration: monitor healing 15: Hypertension: lopressor 50mg  bid      -hr controlled on propranolol 30mg  tid  6/16- BP controlled- con't regimen    06/05/2022    4:04 AM 06/04/2022    7:23 PM 06/04/2022    6:25 PM  Vitals with BMI  Weight 228 lbs 6 oz    BMI 27.08    Systolic 107 121 06/06/2022  Diastolic 71 72 82  Pulse 79 91 97    16. Hypoglycemic to 69: otherwise in 90s: will d/c ISS since not requiring   18. Iron deficiency:  continue daily iron supplement  19. Tremor,?clonus: continue trial of propranolol 10mg  tid  Increased propranolol to 30 mg tid     -see above 20, Constipation: ?mild  -given pattern I see, I'm not inclined to do more than increase his senna-s. Will dc colace  LOS: 28 days A FACE TO FACE EVALUATION WAS PERFORMED  06/06/2022 06/05/2022, 10:41 AM

## 2022-06-05 NOTE — Progress Notes (Signed)
Occupational Therapy TBI Note  Patient Details  Name: Carl Carpenter MRN: [id number] Date of Birth: [birth year]  Today's Date: 06/05/2022 OT Individual Time: 2751-7001 OT Individual Time Calculation (min): 69 min    Short Term Goals: Week 4:  OT Short Term Goal 1 (Week 4): Pt will sit EOB with no more than mod A consistently OT Short Term Goal 2 (Week 4): Pt's caregivers will initiate transfer training in prep for d/c OT Short Term Goal 3 (Week 4): Pt will complete oral care with mod A  Skilled Therapeutic Interventions/Progress Updates:  Pt greeted supine in bed, eager to mobilize OBB. MAX A +2 to don new brief via rolling R and L with MAX A. Pt completed supine>sit with MAX A +2 needing MAX multimodal cues for sequencing and problem solving steps. MOD A for static sitting balance.  Stedy assist for sit>stand with MOD A +2. Pt needing MAX multimodal cues to initiate sitting into TIS.    Total A transport to gym where pt completed squat pivot to EOM with MAX A +2. Worked on static sitting balance from EOM with pt having moments of CGA with BUEs supported. Pt able to choose from two options given between red and yellow ball with pt needing initial hand over hand assist  to initiate reaching for ball, pt even able to follow commands such as "hand me the ball" or "pass the ball to your Right hand"  Pt completed sit>stand from EOM with MOD A +2 with heavy lean to L side, worked on dynamic reach to [employer] with pt instructed to reach for "pink" paper and "yellow" paper. Pt with 100% accuracy during this task. Returned pt to [facility] with pt noted to become restless grabbing onto OTAs wrist and pushing away RT. Pt impulsively reaching out for w/c and when asked, " do you want to go back to the chair." Pt nods "yes" Once in chair did note that pt was incontinent of urine, total A transport back to room for hygiene. Pt completed squat pivot back to bed with MAX A +2. Pt needed MOD A +2 for sit>supine needing  assist to elevate BLEs back to bed. Total A for toileting hygiene and brief change from supine.  Pt left supine in bed with 4 rails up, bed alarm on and family present.                    Therapy Documentation Precautions:  Precautions Precautions: Fall Precaution Comments: PEG with binder Restrictions Weight Bearing Restrictions: No    Pain: no indications of pain during session  Therapy/Group: Individual Therapy  Barron Schmid 06/05/2022, 12:17 PM

## 2022-06-06 LAB — GLUCOSE, CAPILLARY
Glucose-Capillary: 100 mg/dL — ABNORMAL HIGH (ref 70–99)
Glucose-Capillary: 107 mg/dL — ABNORMAL HIGH (ref 70–99)
Glucose-Capillary: 108 mg/dL — ABNORMAL HIGH (ref 70–99)
Glucose-Capillary: 115 mg/dL — ABNORMAL HIGH (ref 70–99)
Glucose-Capillary: 132 mg/dL — ABNORMAL HIGH (ref 70–99)
Glucose-Capillary: 146 mg/dL — ABNORMAL HIGH (ref 70–99)
Glucose-Capillary: 155 mg/dL — ABNORMAL HIGH (ref 70–99)

## 2022-06-06 LAB — BASIC METABOLIC PANEL
Anion gap: 11 (ref 5–15)
BUN: 15 mg/dL (ref 6–20)
CO2: 25 mmol/L (ref 22–32)
Calcium: 10.2 mg/dL (ref 8.9–10.3)
Chloride: 104 mmol/L (ref 98–111)
Creatinine, Ser: 0.83 mg/dL (ref 0.61–1.24)
GFR, Estimated: >60 mL/min (ref >60)
Glucose, Bld: 117 mg/dL — ABNORMAL HIGH (ref 70–99)
Potassium: 4 mmol/L (ref 3.5–5.1)
Sodium: 140 mmol/L (ref 135–145)

## 2022-06-06 LAB — CBC
HCT: 43 % (ref 39.0–52.0)
Hemoglobin: 15.2 g/dL (ref 13.0–17.0)
MCH: 31.5 pg (ref 26.0–34.0)
MCHC: 35.3 g/dL (ref 30.0–36.0)
MCV: 89 fL (ref 80.0–100.0)
Platelets: 243 10*3/uL (ref 150–400)
RBC: 4.83 MIL/uL (ref 4.22–5.81)
RDW: 11.8 % (ref 11.5–15.5)
WBC: 9.6 10*3/uL (ref 4.0–10.5)
nRBC: 0 % (ref 0.0–0.2)

## 2022-06-06 NOTE — Progress Notes (Signed)
Speech Language Pathology TBI Note  Patient Details  Name: Savien Mamula MRN: [id number] Date of Birth: Feb 26, 1986  Today's Date: 06/06/2022 SLP Individual Time:  -     Short Term Goals: Week 5: SLP Short Term Goal 1 (Week 5): Pt will consume therapeutic trials of 1/2 teaspoons of thin liquids and ice chips with minimal overt s/s of aspiration over 3 consecutive sessions and max assist use of swallowing precautions prior to MBS or FEES. - Following Total A for oral care, pt consumed therapeutic trials of 1/4 to spoon dipped of thin water with absent swallow on 1 out of 3 trials; significantly delayed swallow initiation noted on the remainder of trials. No overt s/sx concerning for aspiration observed (throat clearing, coughing, watery eyes, labored breathing).  SLP Short Term Goal 2 (Week 5): Pt will verbalize on command in 25% of opportunities with max assist multimodal when given chocies from a field of 2. - 0% of opportunities despite Max A multimodal cues to include binary choice prompts and yes/no questions.  SLP Short Term Goal 3 (Week 5): Pt will answer basic yes/no questions using via gestures or verbalizations in 50% of opportunities with max assist multimodal cues over 2 sessions. - Appeared to nod head when asked yes/no question re: s/o; otherwise, no response to yes/no questions via verbal, gesture, or low-tech AAC boards.  SLP Short Term Goal 4 (Week 5): Pt will demonstre sustained attention on a targeted stimulus for 2 minutes with max assist multimodal cues over 2 sessions. - Less than one minute despite Max A multimodal cues.  SLP Short Term Goal 5 (Week 5): Pt will follow 1 step  commands in 75% of opportunities with max assist multimodal cues over 2 sessions. - Followed contextual one-step commands in ~50% of opportunities with Max A multimodal cues.  SLP Short Term Goal 6 (Week 5): Patient will initiate functional tasks with Mod A multimodal cues. - Total A for initiation of  functional tasks.  Skilled Therapeutic Interventions: Pt seen this date for skilled ST intervention targeting deglutition and cognitive-linguistic goals outlined above. Pt encountered awake/alert, mildly restless, and sitting upright in bed. Non-verbal. Significant other present for today's session. Agreeable to ST intervention at bedside.  Please see above for objective data re: pt's performance on targeted goals. Family education completed re: behaviors consistent with RLA IV, ST POC, and low stim environment. Pt diaphoretic throughout, but no apparent distress appreciated. Total A for oral care and Max multimodal A for minimal command following during functional context. No verbalizations nor vocalizations appreciated despite attempts and Max A. Appeared to indicate yes/no response x 1 via head nod, though unclear if this was intentional. Despite Max A multimodal cues, pt did not response to low-tech yes/no boards to answer personal yes/no questions re: family and preferences.   Pt left in bed with all safety measures activated. Significant other present upon SLP's departure. Continue per current ST POC.  Pain No indications of pain observed.  Agitated Behavior Scale: TBI Observation Details Observation Environment: CIR Start of observation period - Date: 06/06/22 Start of observation period - Time: 1105 End of observation period - Date: 06/06/22 End of observation period - Time: 1205 Agitated Behavior Scale (DO NOT LEAVE BLANKS) Short attention span, easy distractibility, inability to concentrate: Present to an extreme degree Impulsive, impatient, low tolerance for pain or frustration: Present to a moderate degree Uncooperative, resistant to care, demanding: Present to a moderate degree Violent and/or threatening violence toward people or property: Absent  Explosive and/or unpredictable anger: Absent Rocking, rubbing, moaning, or other self-stimulating behavior: Present to a moderate  degree Pulling at tubes, restraints, etc.: Present to a slight degree Wandering from treatment areas: Present to a slight degree Restlessness, pacing, excessive movement: Present to a moderate degree Repetitive behaviors, motor, and/or verbal: Present to a moderate degree Rapid, loud, or excessive talking: Absent Sudden changes of mood: Absent Easily initiated or excessive crying and/or laughter: Absent Self-abusiveness, physical and/or verbal: Absent Agitated behavior scale total score: 29  Therapy/Group: Individual Therapy  Johntavius Shepard A Roshard Rezabek 06/06/2022, 2:01 PM

## 2022-06-06 NOTE — Progress Notes (Signed)
PROGRESS NOTE   Subjective/Complaints: Patient's spouse is at bedside, says that his movements are a bit different over the past two days, with him being being more focused at grasping at objects such as a blanket.  Per spouse he is having some brief tremors still and restlessness and agitation especially at night.  This am with therapy, he was pale and sweating, but that improved after checking on him again later.  ROS: Limited to cognitive/behavioral  Objective:   No results found. Recent Labs    06/06/22 1210  WBC 9.6  HGB 15.2  HCT 43.0  PLT 243       Recent Labs    06/06/22 1210  NA 140  K 4.0  CL 104  CO2 25  GLUCOSE 117*  BUN 15  CREATININE 0.83  CALCIUM 10.2        Intake/Output Summary (Last 24 hours) at 06/06/2022 1358 Last data filed at 06/06/2022 0505 Gross per 24 hour  Intake 0 ml  Output 630 ml  Net -630 ml            Physical Exam: Vital Signs Blood pressure 132/89, pulse (!) 106, temperature 98.6 F (37 C), resp. rate 17, height 6\' 5"  (1.956 m), weight 103.6 kg, SpO2 95 %.  Constitutional: Not in distress. But appeared pale and diaphoretic this am. Returned to room early afternoon and color had improved and he was no longer diaphoretic. Vital signs reviewed. HEENT: NCAT, EOMI, moist oral membranes Neck: supple Cardiovascular: regular rhythm, periods of intermittent tachycardia,  without murmur, No JVD. Respiratory/Chest: CTA bilaterally, without wheezes or rales.  Normal effort. GI/Abdomen: BS +4, all quadrants, non-tender, non-distended. Ext: No clubbing, cyanosis, or edema Psych: some increased restlessness, trying to swing left leg over to get out of bed on left side. Musculoskeletal: No pain with ROM Skin:  Left knee skin abrasion with scar appears to focus on one object with frequent, repetitive attempts to grasp objects.  Can speak a few words on occasion, per  spouse. Neurological:  Comments:  Restless, making eye contact with and tracking examiner.  DTR's brisk on the right.  Mild flexor tone in right hand.  Moving right side spontaneously.   Assessment/Plan: 1. Functional deficits which require 3+ hours per day of interdisciplinary therapy in a comprehensive inpatient rehab setting. Physiatrist is providing close team supervision and 24 hour management of active medical problems listed below. Physiatrist and rehab team continue to assess barriers to discharge/monitor patient progress toward functional and medical goals  Care Tool:  Bathing    Body parts bathed by patient: Chest, Face, Front perineal area   Body parts bathed by helper: Right arm, Left arm, Abdomen, Right upper leg, Left upper leg, Buttocks, Left lower leg, Right lower leg     Bathing assist Assist Level: Total Assistance - Patient < 25%     Upper Body Dressing/Undressing Upper body dressing   What is the patient wearing?: Pull over shirt    Upper body assist Assist Level: Total Assistance - Patient < 25%    Lower Body Dressing/Undressing Lower body dressing      What is the patient wearing?: Pants  Lower body assist Assist for lower body dressing: 2 Helpers     Toileting Toileting    Toileting assist Assist for toileting: 2 Helpers     Transfers Chair/bed transfer  Transfers assist  Chair/bed transfer activity did not occur: Safety/medical concerns  Chair/bed transfer assist level: 2 Helpers     Locomotion Ambulation   Ambulation assist   Ambulation activity did not occur: Safety/medical concerns          Walk 10 feet activity   Assist  Walk 10 feet activity did not occur: Safety/medical concerns        Walk 50 feet activity   Assist Walk 50 feet with 2 turns activity did not occur: Safety/medical concerns         Walk 150 feet activity   Assist Walk 150 feet activity did not occur: Safety/medical concerns          Walk 10 feet on uneven surface  activity   Assist Walk 10 feet on uneven surfaces activity did not occur: Safety/medical concerns         Wheelchair     Assist Is the patient using a wheelchair?: Yes Type of Wheelchair: Manual    Wheelchair assist level: Dependent - Patient 0%      Wheelchair 50 feet with 2 turns activity    Assist        Assist Level: Dependent - Patient 0%   Wheelchair 150 feet activity     Assist      Assist Level: Dependent - Patient 0%   Blood pressure 132/89, pulse (!) 106, temperature 98.6 F (37 C), resp. rate 17, height 6\' 5"  (1.956 m), weight 103.6 kg, SpO2 95 %.    Medical Problem List and Plan: 1. 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  DVT right posterior tibial and peroneal veins (03/22/22): Discussed with mother that repeat doppler shows no new clots and resolution of one previous clot.  -anticoagulation: continue Eliquis             -antiplatelet therapy: none 3. Pain: continueTylenol prn 4. Behavior/sleep: LCSW to evaluate and provide emotional support             -antipsychotic agents: none  -increasing propranolol for restlessness  -continue low dose seroquel at night for restlessness/agitation  -added valproic acid 250mg  bid 6/15, titrate up as tolerated/needed---max titration would be around 500mg  tid  -if he becomes more agitated this coming week, seroquel dose and frequency could be increased  6/17 Per nursing and spouse has had some increased agitation at night, unsure if it could be r/t increased responsiveness to stimuli?  Also, has SBP spike  to 160 overnight, with intermittent tachycardia (100's). 5. Impaired attention/cognition: This patient is not capable of making decisions on his own behalf.             - ritalin  25mg  bid -continue  6/16- adjusted sinemet to 25/100 yesterday   -  stopped amantadine      -there have been notable improvements in initiation since sinemet was initiated this week. Not sure I would tolerate this much further 6/17 appears to pay attention, but also to be fixated on one thing.  For example, was continuously grasping at blanket this morning, later came back and was repeatedly grasping his left upper thigh with his left hand.  6. Dysphagia. S/p PEG 3/29.  --NPO -continue TF/boluses -Continue zinc oxide to protect skin surrounding PEG tube -5/30 replaced PEG under fluoro. Appreciate IR help -PEG site remains clean. Bumper has been loose on tube. Just needs to be cinched back down. They can also tape it as well. A rubber band might work better. 6/17 PEG site clean, still issues with bumper. 7. Fluids/Electrolytes/Nutrition: Routine Is and Os and follow-up chemistries             --continue tube feeds Jevity 1.5 boluses             --continue Prosource             - prealbumin 35.8, gaining weight 6/17 Will get pre-albumin on Monday labs. Filed Weights   06/03/22 0411 06/04/22 0416 06/05/22 0404  Weight: 103 kg 103 kg 103.6 kg    9: C1 right transverse process fracture--he's out of collar 10: Left Occipital condyle fracture--out of collar 11: Acute ventilator respiratory failure requiring trach and PEG 3/29              --decannulated , stoma closed             --robitussin can be prn  -discontinued robinul via PEG      12: Right ear laceration:  healing 14: Tongue laceration: monitor healing 15: Hypertension: lopressor 50mg  bid      -hr controlled on propranolol 30mg  tid  6/16- BP controlled- con't regimen 6/17 BP controlled, intermittent tachycardia (100-110), may be due to agitation vs  increased responsiveness to environmental stimuli.    06/06/2022    1:13 PM 06/06/2022    4:35 AM 06/05/2022    8:03 PM  Vitals with BMI  Systolic 132 122 06/08/2022  Diastolic 89 82 74  Pulse 106 97 110    16. Hypoglycemic to 69: otherwise in 90s: will d/c ISS since not requiring   18. Iron deficiency:  continue daily iron supplement  19. Tremor,?clonus: continue trial of propranolol 10mg  tid  Increased propranolol to 30 mg tid     -see above 6/17 Continue to monitor for tremor, per spouse he had a very slight tremor or very brief duration with the past two days. 20, Constipation: ?mild  -given pattern I see, I'm not inclined to do more than increase his senna-s. Will dc colace 6/17 LBM 06/05/22  LOS: 29 days A FACE TO FACE EVALUATION WAS PERFORMED  7/17 06/06/2022, 1:58 PM

## 2022-06-06 NOTE — Progress Notes (Signed)
Occupational Therapy TBI Note  Patient Details  Name: Adonus Uselman MRN: [id number] Date of Birth: 1986/07/10  Today's Date: 06/06/2022 OT Individual Time: 1105-1205 OT Individual Time Calculation (min): 60 min   Short Term Goals: Week 4:  OT Short Term Goal 1 (Week 4): Pt will sit EOB with no more than mod A consistently OT Short Term Goal 2 (Week 4): Pt's caregivers will initiate transfer training in prep for d/c OT Short Term Goal 3 (Week 4): Pt will complete oral care with mod A  Skilled Therapeutic Interventions/Progress Updates:    Pt greeted semi-reclined in bed with PA present assessing pt, significant other also present. Pt diaphoretic in bed with increased HR of 119. Max A to get to sitting EOB with perseverative grasp requiring hand over hand A to let go of handrail. HR down to 98 once seated EOB> Stedy transfer with mod +2 to roll-in shower chair. Bathing completed with pt able to initiate washing chest, face, and peri-area with moderate cues and hand over hand to initiate. Pt with strong extensor tone in L UE and continued with perseverative grasp. Stedy transfer from shower chair to TIS wc with mod +2. Max A to thread pants, then pt initiated trying to pull pants up to knees, but still needed +2 assist to get pants up and max+2 to stand using 3 muskateers with strong posterior lean, facilitation for hip and trunk extension. Pt initiated one step of UB dressing task with max multimodal cues. Pt left seated in TIS wc with seat belt on and spouse present.   Therapy Documentation Precautions:  Precautions Precautions: Fall Precaution Comments: PEG with binder Restrictions Weight Bearing Restrictions: No Pain: Pain Assessment Pain Scale: Faces Faces Pain Scale: Hurts a little bit Pain Location: Knee Pain Orientation: Left Pain Intervention(s): Medication (See eMAR) Agitated Behavior Scale: TBI Observation Details Observation Environment: CIR Start of observation period -  Date: 06/06/22 Start of observation period - Time: 1105 End of observation period - Date: 06/06/22 End of observation period - Time: 1205 Agitated Behavior Scale (DO NOT LEAVE BLANKS) Short attention span, easy distractibility, inability to concentrate: Present to an extreme degree Impulsive, impatient, low tolerance for pain or frustration: Present to a moderate degree Uncooperative, resistant to care, demanding: Present to a moderate degree Violent and/or threatening violence toward people or property: Absent Explosive and/or unpredictable anger: Absent Rocking, rubbing, moaning, or other self-stimulating behavior: Present to a moderate degree Pulling at tubes, restraints, etc.: Present to a slight degree Wandering from treatment areas: Present to a slight degree Restlessness, pacing, excessive movement: Present to a moderate degree Repetitive behaviors, motor, and/or verbal: Present to a moderate degree Rapid, loud, or excessive talking: Absent Sudden changes of mood: Absent Easily initiated or excessive crying and/or laughter: Absent Self-abusiveness, physical and/or verbal: Absent Agitated behavior scale total score: 29   Therapy/Group: Individual Therapy  Mal Amabile 06/06/2022, 12:31 PM

## 2022-06-07 LAB — GLUCOSE, CAPILLARY
Glucose-Capillary: 101 mg/dL — ABNORMAL HIGH (ref 70–99)
Glucose-Capillary: 102 mg/dL — ABNORMAL HIGH (ref 70–99)
Glucose-Capillary: 105 mg/dL — ABNORMAL HIGH (ref 70–99)
Glucose-Capillary: 105 mg/dL — ABNORMAL HIGH (ref 70–99)
Glucose-Capillary: 116 mg/dL — ABNORMAL HIGH (ref 70–99)
Glucose-Capillary: 135 mg/dL — ABNORMAL HIGH (ref 70–99)

## 2022-06-07 NOTE — Progress Notes (Signed)
PROGRESS NOTE   Subjective/Complaints: [name] is at bedside reporting that he is doing really well.  He was able to recognize her and even vocalize "Hi mom".  He is tracking well with his eyes and was able to raise his right thumb up with this examiner's request.  Per mom, he was partially able to put on his shirt today with assistance and was able to write his name on a Father's Day card using his left hand to guide his right hand.  ROS: Limited to cognitive/behavioral  Objective:   No results found. Recent Labs    06/06/22 1210  WBC 9.6  HGB 15.2  HCT 43.0  PLT 243       Recent Labs    06/06/22 1210  NA 140  K 4.0  CL 104  CO2 25  GLUCOSE 117*  BUN 15  CREATININE 0.83  CALCIUM 10.2       No intake or output data in the 24 hours ending 06/07/22 1232           Physical Exam: Vital Signs Blood pressure 113/68, pulse 93, temperature 98 F (36.7 C), temperature source Oral, resp. rate (!) 21, height 6\' 5"  (1.956 m), weight 101.6 kg, SpO2 95 %.  Constitutional: Not in distress. Appears well with yesterday's episode of being pale and diaphoretic resolved. Vital signs reviewed. HEENT: NCAT, EOMI, moist oral membranes Neck: supple Cardiovascular: regular rhythm, periods of intermittent tachycardia,  without murmur, No JVD. Respiratory/Chest: CTA bilaterally, without wheezes or rales.  Normal effort. GI/Abdomen: BS +4, all quadrants, non-tender, non-distended. Ext: No clubbing, cyanosis, or edema Psych: some increased restlessness, trying to swing left leg over to get out of bed on left side. Musculoskeletal: No pain with ROM Skin:  Left knee skin abrasion with scar appears to focus on one object with frequent, repetitive attempts to grasp objects.  Can speak a few words on occasion, per spouse and mother. Neurological:  DTR's brisk on the right.  Mild flexor tone in right hand.  Moving right  side spontaneously. Comments:  Today patient is calm and appears more focused.  Per family and nursing he has periods of restlessness in the past few days prior.  Per pt's mother, she thinks some of the new medication changes made at the end of last week have improved his attention and restlessness symptoms. Making eye contact with and tracking examiner, with some episodes where he appears "zoned out" not tracking.  Per Mom, she thinks his periods of attention are increasing while periods of inattention are decreasing.    Assessment/Plan: 1. Functional deficits which require 3+ hours per day of interdisciplinary therapy in a comprehensive inpatient rehab setting. Physiatrist is providing close team supervision and 24 hour management of active medical problems listed below. Physiatrist and rehab team continue to assess barriers to discharge/monitor patient progress toward functional and medical goals  Care Tool:  Bathing    Body parts bathed by patient: Chest, Face, Front perineal area   Body parts bathed by helper: Right arm, Left arm, Abdomen, Right upper leg, Left upper leg, Buttocks, Left lower leg, Right lower leg     Bathing assist Assist Level: Total  Assistance - Patient < 25%     Upper Body Dressing/Undressing Upper body dressing   What is the patient wearing?: Pull over shirt    Upper body assist Assist Level: Total Assistance - Patient < 25%    Lower Body Dressing/Undressing Lower body dressing      What is the patient wearing?: Pants     Lower body assist Assist for lower body dressing: 2 Helpers     Toileting Toileting    Toileting assist Assist for toileting: 2 Helpers     Transfers Chair/bed transfer  Transfers assist  Chair/bed transfer activity did not occur: Safety/medical concerns  Chair/bed transfer assist level: 2 Helpers     Locomotion Ambulation   Ambulation assist   Ambulation activity did not occur: Safety/medical concerns           Walk 10 feet activity   Assist  Walk 10 feet activity did not occur: Safety/medical concerns        Walk 50 feet activity   Assist Walk 50 feet with 2 turns activity did not occur: Safety/medical concerns         Walk 150 feet activity   Assist Walk 150 feet activity did not occur: Safety/medical concerns         Walk 10 feet on uneven surface  activity   Assist Walk 10 feet on uneven surfaces activity did not occur: Safety/medical concerns         Wheelchair     Assist Is the patient using a wheelchair?: Yes Type of Wheelchair: Manual    Wheelchair assist level: Dependent - Patient 0%      Wheelchair 50 feet with 2 turns activity    Assist        Assist Level: Dependent - Patient 0%   Wheelchair 150 feet activity     Assist      Assist Level: Dependent - Patient 0%   Blood pressure 113/68, pulse 93, temperature 98 F (36.7 C), temperature source Oral, resp. rate (!) 21, height 6\' 5"  (1.956 m), weight 101.6 kg, SpO2 95 %.    Medical Problem List and Plan: 1. 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  DVT right posterior tibial and peroneal veins (03/22/22): Discussed with mother that repeat doppler shows no new clots and resolution of one previous clot.  -anticoagulation: continue Eliquis             -antiplatelet therapy: none 3. Pain: continueTylenol prn 4. Behavior/sleep: LCSW to evaluate and provide emotional support             -antipsychotic agents: none  -increasing propranolol for restlessness  -continue low dose seroquel at night for  restlessness/agitation  -added valproic acid 250mg  bid 6/15, titrate up as tolerated/needed---max titration would be around 500mg  tid  -if he becomes more agitated this coming week, seroquel dose and frequency could be increased  6/17 Per nursing and spouse has had some increased agitation  at night, unsure if it could be r/t increased responsiveness to stimuli?  Also, has SBP spike to 160 overnight, with intermittent tachycardia (100's). 6/18 Appears overall more calm and less restless today, HR ranging 93-106, but not seeing SBP spikes, with SBP mostly in the 120's. 5. Impaired attention/cognition: This patient is not capable of making decisions on his own behalf.             - ritalin  25mg  bid -continue  6/16- adjusted sinemet to 25/100 yesterday   -stopped amantadine      -there have been notable improvements in initiation since sinemet was initiated this week. Not sure I would tolerate this much further 6/17 appears to pay attention, but also to be fixated on one thing.  For example, was continuously grasping at blanket this morning, later came back and was repeatedly grasping his left upper thigh with his left hand.  6/18 Attention and cognition are improved today, per exam and report by mother at bedside.  Continue ritalin, sinemet, Seroquel, and valproic acid at current dosages. 6. Dysphagia. S/p PEG 3/29.  --NPO -continue TF/boluses -Continue zinc oxide to protect skin surrounding PEG tube -5/30 replaced PEG under fluoro. Appreciate IR help -PEG site remains clean. Bumper has been loose on tube. Just needs to be cinched back down. They can also tape it as well. A rubber band might work better. 6/18 PEG site clean, still issues with bumper. 7. Fluids/Electrolytes/Nutrition: Routine Is and Os and follow-up chemistries             --continue tube feeds Jevity 1.5 boluses             --continue Prosource             - prealbumin 35.8, gaining weight 6/17 Will get pre-albumin on Monday  labs. 6/18 Daily weights down today at 101.6 kg, trend with tomorrow's weights. Filed Weights   06/04/22 0416 06/05/22 0404 06/07/22 0500  Weight: 103 kg 103.6 kg 101.6 kg    9: C1 right transverse process fracture--he's out of collar 10: Left Occipital condyle fracture--out of collar 11: Acute ventilator respiratory failure requiring trach and PEG 3/29              --decannulated , stoma closed             --robitussin can be prn  -discontinued robinul via PEG      12: Right ear laceration:  healing 14: Tongue laceration: monitor healing 15: Hypertension: lopressor 50mg  bid      -hr controlled on propranolol 30mg  tid  6/16- BP controlled- con't regimen 6/17 BP controlled, intermittent tachycardia (100-110), may be due to agitation vs increased responsiveness to environmental stimuli. 6/18 SBP appears more settled today, mostly in the 120's. Suspect that elevated BP seen within past few days could be related to agitation, which has now improved.    06/07/2022    5:00 AM 06/07/2022    4:06 AM 06/06/2022    8:07 PM  Vitals with BMI  Weight 224 lbs    BMI 26.56    Systolic  113 123  Diastolic  68 83  Pulse  93 106    16. Hypoglycemic to 69: otherwise in 90s: will d/c ISS since not requiring   18. Iron deficiency:  continue daily iron supplement  19. Tremor,?clonus: continue trial of propranolol 10mg  tid  Increased propranolol to 30 mg tid     -see above 6/17 Continue to monitor for tremor, per spouse he  had a very slight tremor or very brief duration with the past two days. 6/18 Continue propranolol 30 mg tid for tremor. 20, Constipation: ?mild  -given pattern I see, I'm not inclined to do more than increase his senna-s. Will dc colace 6/18 LBM 06/05/22, use prns if needed.  LOS: 30 days A FACE TO FACE EVALUATION WAS PERFORMED  Tressia Miners 06/07/2022, 12:32 PM

## 2022-06-07 NOTE — Progress Notes (Signed)
Physical Therapy TBI Note  Patient Details  Name: Eleanor Dimichele MRN: [id number] Date of Birth: 08/01/1986  Today's Date: 06/07/2022 PT Individual Time: 1120-1230 PT Individual Time Calculation (min): 70 min   Short Term Goals: Week 5:  PT Short Term Goal 1 (Week 5): Pt will complete bed mobility with modA +1. PT Short Term Goal 2 (Week 5): Pt will complete bed to chair transfer with modA +1. PT Short Term Goal 3 (Week 5): Pt will ambulate x100' with modA +2 and LRAD.  Skilled Therapeutic Interventions/Progress Updates:     Patient in TIS w/c with his mother in the room upon PT arrival. Patient alert and participatory throughout session. No signs or indicators of pain during session. Patient noted to be less motivated by mobility today with new behaviors of pushing objects or the w/c with his L upper and lower extremity, unable to determine if this was to communicate something, restlessness, or behavioral. Patient did verbalize yes/no responses appropriately x3 with his mother asking questions and making direct eye contact. Multiple other attempts at communication without success due to motor apraxia and expressive aphasia. Patient continues to follow simple, one-step commands >75% of the time with increased time for initiation/processing. Also, noted reduced attention to tasks this session, limited to <1 min trials of activities. [name] expressed concerns about changes in patient's behaviors since medication changed at end of last week, will inform MD of concerns.   Therapeutic Activity: Transfers: Patient performed sit to/from stand x3 using a RW with mod-max A +2. Provided verbal cues for initiation, forward weight shift, hand-over-hand for hand placement R>L, and foot placement. PT blocked R knee and facilitated forward trunk motion. Patient stood 3x20-60 sec using RW progressing from mod A +2 to min A of 1, then weight shifts with mod A for facilitation and +2 min A for safety.  He  performed sit to stand with 3 musketeer technique x1 with mod A +2, attempted gait training with patient initiating steps x6 ft before patient pushing with L lower extremity and inhibiting further stepping requiring patient to return to sitting with max A +2.  Once in sitting, patient began turning the TIS w/c with his L foot, facilitated progression to lower extremity propulsion with max A on R to turn 180 deg and forward x5 feet before patient began reaching with his L hand on the rail on the wall to propel forward and needing total A for R lower extremity management. Patient ran out of rail and initiated propulsion on the w/c wheel on the L and followed cues to reach for the wheel on the R, but unable to maintain a functional grasp due to TIS w/c design. Recommend trial in manual w/c soon.  Patient returned to his room and continued to push with his L foot and L hand when his mother was in front of him. Tried multiple attempts to redirect patient and engage patient in communicating his needs. Removed patient's shoes and socks with total A with reduced pushing. Assisted patient with donning socks with mod A in figure four sitting with patient self initiating position and use of B upper extremities to complete task once started using backwards chaining. Initiation also noted on R with max A due to decreased strength/coordination on R.  Patient in TIS w/c, pushing slightly less with socks donned, with his mother in the room at end of session with breaks locked, seat belt secured, and all needs within reach.   Therapy Documentation Precautions:  Precautions Precautions: Fall Precaution Comments: PEG with binder Restrictions Weight Bearing Restrictions: No Agitated Behavior Scale: TBI Observation Details Observation Environment: CIR Start of observation period - Date: 06/07/22 Start of observation period - Time: 1120 End of observation period - Date: 06/07/22 End of observation period - Time:  1230 Agitated Behavior Scale (DO NOT LEAVE BLANKS) Short attention span, easy distractibility, inability to concentrate: Present to an extreme degree Impulsive, impatient, low tolerance for pain or frustration: Present to a moderate degree Uncooperative, resistant to care, demanding: Present to a moderate degree Violent and/or threatening violence toward people or property: Absent Explosive and/or unpredictable anger: Absent Rocking, rubbing, moaning, or other self-stimulating behavior: Present to a moderate degree Pulling at tubes, restraints, etc.: Absent Wandering from treatment areas: Absent Restlessness, pacing, excessive movement: Present to a slight degree Repetitive behaviors, motor, and/or verbal: Present to a moderate degree Rapid, loud, or excessive talking: Absent Sudden changes of mood: Absent Easily initiated or excessive crying and/or laughter: Absent Self-abusiveness, physical and/or verbal: Absent Agitated behavior scale total score: 26    Therapy/Group: Individual Therapy  Bart Ashford L Christop Hippert PT, DPT  06/07/2022, 4:17 PM

## 2022-06-08 DIAGNOSIS — R000 Tachycardia, unspecified: Secondary | ICD-10-CM

## 2022-06-08 DIAGNOSIS — R1310 Dysphagia, unspecified: Secondary | ICD-10-CM

## 2022-06-08 LAB — GLUCOSE, CAPILLARY
Glucose-Capillary: 102 mg/dL — ABNORMAL HIGH (ref 70–99)
Glucose-Capillary: 102 mg/dL — ABNORMAL HIGH (ref 70–99)
Glucose-Capillary: 93 mg/dL (ref 70–99)
Glucose-Capillary: 124 mg/dL — ABNORMAL HIGH (ref 70–99)
Glucose-Capillary: 100 mg/dL — ABNORMAL HIGH (ref 70–99)

## 2022-06-08 LAB — CBC
HCT: 39.3 % (ref 39.0–52.0)
Hemoglobin: 13.1 g/dL (ref 13.0–17.0)
MCH: 31 pg (ref 26.0–34.0)
MCHC: 33.3 g/dL (ref 30.0–36.0)
MCV: 92.9 fL (ref 80.0–100.0)
Platelets: 175 10*3/uL (ref 150–400)
RBC: 4.23 MIL/uL (ref 4.22–5.81)
RDW: 12.2 % (ref 11.5–15.5)
WBC: 6 10*3/uL (ref 4.0–10.5)
nRBC: 0 % (ref 0.0–0.2)

## 2022-06-08 NOTE — Progress Notes (Signed)
Dois Davenport PA notified of pt increase in secretions and cough. No new orders at this time.  Mylo Red, LPN

## 2022-06-08 NOTE — Progress Notes (Signed)
Pt spouse and father educated on and returned demonstration of tube feedings and medication administration. Questions answered. No complications noted. Mylo Red, LPN

## 2022-06-08 NOTE — Progress Notes (Signed)
PROGRESS NOTE   Subjective/Complaints: Father at bedside holding patient's hand while he receives tube feeds Patient is somnolent Father and nurse report no concerns  ROS: Limited to cognitive/behavioral  Objective:   No results found. Recent Labs    06/06/22 1210 06/08/22 0614  WBC 9.6 6.0  HGB 15.2 13.1  HCT 43.0 39.3  PLT 243 175       Recent Labs    06/06/22 1210  NA 140  K 4.0  CL 104  CO2 25  GLUCOSE 117*  BUN 15  CREATININE 0.83  CALCIUM 10.2        Intake/Output Summary (Last 24 hours) at 06/08/2022 1322 Last data filed at 06/08/2022 0441 Gross per 24 hour  Intake --  Output 500 ml  Net -500 ml             Physical Exam: Vital Signs Blood pressure 113/75, pulse 84, temperature 98.7 F (37.1 C), resp. rate 15, height 6\' 5"  (1.956 m), weight 102.5 kg, SpO2 96 %.  Constitutional: Not in distress. Appears well with yesterday's episode of being pale and diaphoretic resolved. Vital signs reviewed. BMI 26.80, somnolent HEENT: NCAT, EOMI, moist oral membranes Neck: supple Cardiovascular: regular rhythm, periods of intermittent tachycardia,  without murmur, No JVD. Respiratory/Chest: CTA bilaterally, without wheezes or rales.  Normal effort. GI/Abdomen: BS +4, all quadrants, non-tender, non-distended. Ext: No clubbing, cyanosis, or edema Psych: some increased restlessness, trying to swing left leg over to get out of bed on left side. Musculoskeletal: No pain with ROM Skin:  Left knee skin abrasion with scar appears to focus on one object with frequent, repetitive attempts to grasp objects.  Can speak a few words on occasion, per spouse and mother. Neurological:  DTR's brisk on the right.  Mild flexor tone in right hand.  Moving right side spontaneously. Comments:  Today patient is calm and appears more focused.  Per family and nursing he has periods of restlessness in the past few days  prior.  Per pt's mother, she thinks some of the new medication changes made at the end of last week have improved his attention and restlessness symptoms. Making eye contact with and tracking examiner, with some episodes where he appears "zoned out" not tracking.  Per Mom, she thinks his periods of attention are increasing while periods of inattention are decreasing.    Assessment/Plan: 1. Functional deficits which require 3+ hours per day of interdisciplinary therapy in a comprehensive inpatient rehab setting. Physiatrist is providing close team supervision and 24 hour management of active medical problems listed below. Physiatrist and rehab team continue to assess barriers to discharge/monitor patient progress toward functional and medical goals  Care Tool:  Bathing    Body parts bathed by patient: Chest, Face, Front perineal area   Body parts bathed by helper: Right arm, Left arm, Abdomen, Right upper leg, Left upper leg, Buttocks, Left lower leg, Right lower leg     Bathing assist Assist Level: Total Assistance - Patient < 25%     Upper Body Dressing/Undressing Upper body dressing   What is the patient wearing?: Pull over shirt    Upper body assist Assist Level: Total Assistance -  Patient < 25%    Lower Body Dressing/Undressing Lower body dressing      What is the patient wearing?: Pants, Underwear/pull up     Lower body assist Assist for lower body dressing: 2 Helpers (MAX A +2 bed level)     Toileting Toileting    Toileting assist Assist for toileting: 2 Helpers     Transfers Chair/bed transfer  Transfers assist  Chair/bed transfer activity did not occur: Safety/medical concerns  Chair/bed transfer assist level: Dependent - mechanical lift (stedy)     Locomotion Ambulation   Ambulation assist   Ambulation activity did not occur: Safety/medical concerns          Walk 10 feet activity   Assist  Walk 10 feet activity did not occur: Safety/medical  concerns        Walk 50 feet activity   Assist Walk 50 feet with 2 turns activity did not occur: Safety/medical concerns         Walk 150 feet activity   Assist Walk 150 feet activity did not occur: Safety/medical concerns         Walk 10 feet on uneven surface  activity   Assist Walk 10 feet on uneven surfaces activity did not occur: Safety/medical concerns         Wheelchair     Assist Is the patient using a wheelchair?: Yes Type of Wheelchair: Manual    Wheelchair assist level: Dependent - Patient 0%      Wheelchair 50 feet with 2 turns activity    Assist        Assist Level: Dependent - Patient 0%   Wheelchair 150 feet activity     Assist      Assist Level: Dependent - Patient 0%   Blood pressure 113/75, pulse 84, temperature 98.7 F (37.1 C), resp. rate 15, height 6\' 5"  (1.956 m), weight 102.5 kg, SpO2 96 %.    Medical Problem List and Plan: 1. 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  DVT right posterior tibial and peroneal veins (03/22/22): Discussed with mother that repeat doppler shows no new clots and resolution of one previous clot.  -anticoagulation: continue Eliquis             -antiplatelet therapy: none 3. Pain: continueTylenol prn 4. Behavior/sleep: LCSW to evaluate and provide emotional support             -antipsychotic agents: none  -increasing propranolol for restlessness  -continue low dose seroquel at night for restlessness/agitation  -added valproic acid 250mg  bid 6/15, titrate up as tolerated/needed---max titration would be around 500mg  tid  -if  he becomes more agitated this coming week, seroquel dose and frequency could be increased  6/17 Per nursing and spouse has had some increased agitation at night, unsure if it could be r/t increased responsiveness to stimuli?  Also, has SBP spike to 160 overnight, with intermittent tachycardia (100's). 6/18 Appears overall more calm and  less restless today, HR ranging 93-106, but not seeing SBP spikes, with SBP mostly in the 120's. 5. Impaired attention/cognition: This patient is not capable of making decisions on his own behalf.             - ritalin  25mg  bid -continue  6/16- adjusted sinemet to 25/100 yesterday   -stopped amantadine      -there have been notable improvements in initiation since sinemet was initiated this week. Not sure I would tolerate this much further 6/17 appears to pay attention, but also to be fixated on one thing.  For example, was continuously grasping at blanket this morning, later came back and was repeatedly grasping his left upper thigh with his left hand.  6/18 Attention and cognition are improved today, per exam and report by mother at bedside.  Continue ritalin, sinemet, Seroquel, and valproic acid at current dosages. 6. Dysphagia. S/p PEG 3/29.  --NPO -continue TF/boluses -Continue zinc oxide to protect skin surrounding PEG tube -5/30 replaced PEG under fluoro. Appreciate IR help -PEG site remains clean. Bumper has been loose on tube. Just needs to be cinched back down. They can also tape it as well. A rubber band might work better. 6/18 PEG site clean, still issues with bumper. 7. Fluids/Electrolytes/Nutrition: Routine Is and Os and follow-up chemistries             --continue tube feeds Jevity 1.5 boluses             --continue Prosource             - prealbumin 35.8, gaining weight 6/17 Will get pre-albumin on Monday labs. 6/18 Daily weights down today at 101.6 kg, trend with tomorrow's weights. Filed Weights   06/05/22 0404 06/07/22 0500 06/08/22 0441   Weight: 103.6 kg 101.6 kg 102.5 kg    9: C1 right transverse process fracture--he's out of collar 10: Left Occipital condyle fracture--out of collar 11: Acute ventilator respiratory failure requiring trach and PEG 3/29              --decannulated , stoma closed             --robitussin can be prn  -discontinued robinul via PEG      12: Right ear laceration:  healing 14: Tongue laceration: monitor healing 15: Hypertension: lopressor 50mg  bid      -hr controlled on propranolol 30mg  tid  6/16- BP controlled- con't regimen 6/17 BP controlled, intermittent tachycardia (100-110), may be due to agitation vs increased responsiveness to environmental stimuli. 6/18 SBP appears more settled today, mostly in the 120's. Suspect that elevated BP seen within past few days could be related to agitation, which has now improved.    06/08/2022    4:41 AM 06/08/2022    4:02 AM 06/07/2022    8:11 PM  Vitals with BMI  Weight 226 lbs    BMI 26.79    Systolic  113 120  Diastolic  75 82  Pulse  84 97    16. Hypoglycemic to 69: otherwise in 90s: will d/c ISS since not requiring   18. Iron deficiency:  conitnue daily iron supplement  19. Tremor,?clonus: continue trial of propranolol 10mg  tid  Increased propranolol to 30 mg tid     -see above 6/17 Continue to monitor for tremor, per spouse he had a very slight tremor or very brief duration with the past two days. 6/18 Continue propranolol 30 mg tid for tremor. 20, Constipation: ?mild  -given pattern I see,  I'm not inclined to do more than increase his senna-s. Will dc colace 6/18 LBM 06/05/22, use prns if needed.  LOS: 31 days A FACE TO FACE EVALUATION WAS PERFORMED  Mykalah Saari P Kamyrah Feeser 06/08/2022, 1:22 PM

## 2022-06-08 NOTE — Progress Notes (Addendum)
Notified by nursing that GF reported increase in patient cough this afternoon.   Lungs are CTA bilaterally with good cough effort. No sputum production. Tm 99.3 yesterday afternoon, 98.3 at 2pm with RR 18. I do not appreciate excessive secretions, but have messaged SLP to see if re-starting Robinul would be of benefit.

## 2022-06-08 NOTE — Progress Notes (Signed)
Occupational Therapy TBI Note  Patient Details  Name: Carl Carpenter MRN: [id number] Date of Birth: [birth year]  Today's Date: 06/08/2022 OT Individual Time: 0947-0962 OT Individual Time Calculation (min): 43 min    Short Term Goals: Week 4:  OT Short Term Goal 1 (Week 4): Pt will sit EOB with no more than mod A consistently OT Short Term Goal 2 (Week 4): Pt's caregivers will initiate transfer training in prep for d/c OT Short Term Goal 3 (Week 4): Pt will complete oral care with mod A  Skilled Therapeutic Interventions/Progress Updates:  Pt greeted asleep in supine with father junior present for family ed.   General education provided on current level of assist for ADLs and recommendation of stedy for ADL transfers and to decrease caregiver burden. Also recommended using +2 for ADLS at home. Recommended pt complete bed baths at home and dressing from bed level, using stedy for transfers to TIS and to [facility] ( junior unsure if w/c would fit through double wide doorways). Recommended establishing ADL routine in conjunction with timed toileting to decrease incontinence.  [name] assisted with stedy transfers and all  grooming and dressing tasks during session as pt currently needs MAX A +2 for LB dressing from supine, worked on grooming tasks at sink with pt able to initiate brushing hair with initial hand over hand facilitation. Pt nodding yes inconsistently but no other verbalizations noted. Education provided on trying to allow pt to initiate task before stepping in to help and giving pt the time to respond to questions as well as providing pt with two options when asking pt questions. Reviewed safety plan with junior and reinforced education on rancho levels. Pt left seated in TIS with SLP entering for session.    Therapy Documentation Precautions:  Precautions Precautions: Fall Precaution Comments: PEG with binder Restrictions Weight Bearing Restrictions: No    Pain: no indications of  pain noted     Therapy/Group: Individual Therapy  Pollyann Glen [facility] 06/08/2022, 12:05 PM

## 2022-06-08 NOTE — Progress Notes (Addendum)
Physical Therapy Session Note  Patient Details  Name: Carl Carpenter MRN: [id number] Date of Birth: Jul 21, 1986  Today's Date: 06/08/2022 PT Individual Time: 1105-1205 PT Individual Time Calculation (min): 60 min   Short Term Goals: Week 5:  PT Short Term Goal 1 (Week 5): Pt will complete bed mobility with modA +1. PT Short Term Goal 2 (Week 5): Pt will complete bed to chair transfer with modA +1. PT Short Term Goal 3 (Week 5): Pt will ambulate x100' with modA +2 and LRAD.  Skilled Therapeutic Interventions/Progress Updates:     Patient asleep in the TIS w/c with his father in the room upon PT arrival. Patient difficulty to arouse and would keep his eyes closed with mobility, but still participatory with simple commands throughout session. No signs or indicators of pain during session. Continued to note patient with reduced attention and participation in mobility this session, as reported yesterday.   Patient's wife arrived at end of session. She also reports the patient with reduced interaction and increased non-purposeful pushing with his L upper and lower extremities since med adjustments at the end of last week. MD and rehab team made aware.   Patient incontinent of bladder upon PT arrival. Focused session on hands on family education with the patient's father on toilet transfers to [facility] and the bed using the [facility]. Reports that the patient's SO plans to obtain a Stedy for patient to transfer at home as this significantly reduces the burden of care for this patient.   Educated on process for obtaining a custom w/c and need for w/c evaluation prior to d/c for patient to sit safely OOB, manage pressure relief, and allow for patient transport and progress to self propulsion. Discussed barriers of TIS w/c for transport into the community, will discuss w/c recs with lead PT.   Therapeutic Activity: Bed Mobility: Patient performed sit to supine with mod-max A +2. Provided max multimodal cues for  use of his bottom elbow for trunk control and lifting his legs onto the bed with reduced effort/volitional movement from previous sessions. Transfers: Patient performed dependent transfers in the [place] TIS w/c>BSC and BSC>bed with patient's father performing +2 assist on the L. The patient performed sit to/from stand from the TIS w/c and BSC x2 with max-mod A +2 and x2 from [facility] seat with mod A +2. Provided multimodal cues and increased time for initiation for hand placement, forward weight shift, initiation, and hip/trunk extension. Patient was continent/incontinent of bowl and bladder during toileting. Performed peri-care and changed shirt, brief, and shorts with total A. Patient required max-mod A +2 for standing balance during peri-care and lower body clothing management.  Patient sat on BSC >10 min during BM with CGA-min A for trunk control, placed BSC against a blank wall with a pillow behind the patient's back for improved stability in sitting. Patient with intermittent R trunk lean requiring mod A to correct intermittently. Patient with eyes closed throughout without interaction with PT or his father.   Patient in bed with his father and SO at bedside at end of session with breaks locked, bed alarm set, and all needs within reach.   Cleared patient's father for transfers with nursing staff using Stedy +2. Safety plan updated.  Therapy Documentation Precautions:  Precautions Precautions: Fall Precaution Comments: PEG with binder Restrictions Weight Bearing Restrictions: No    Therapy/Group: Individual Therapy  Marisa Hufstetler L Zalen Sequeira PT, DPT  06/08/2022, 4:25 PM

## 2022-06-08 NOTE — Progress Notes (Signed)
Physical Therapy TBI Note  Patient Details  Name: Carl Carpenter MRN: [id number] Date of Birth: [birth year]  Today's Date: 06/08/2022 PT Individual Time: 1416-1500 PT Individual Time Calculation (min): 44 min   Short Term Goals: Week 4:  PT Short Term Goal 1 (Week 4): Pt will attend to therapy task for 5 minutes with max cues. PT Short Term Goal 1 - Progress (Week 4): Progressing toward goal PT Short Term Goal 2 (Week 4): Pt will completes bed to chair transfer with max/totalA +1. PT Short Term Goal 2 - Progress (Week 4): Met PT Short Term Goal 3 (Week 4): Patient with progress therapeutic gait >50 ft with max A +2 for increased activity tolerance and motor planning PT Short Term Goal 3 - Progress (Week 4): Met Week 5:  PT Short Term Goal 1 (Week 5): Pt will complete bed mobility with modA +1. PT Short Term Goal 2 (Week 5): Pt will complete bed to chair transfer with modA +1. PT Short Term Goal 3 (Week 5): Pt will ambulate x100' with modA +2 and LRAD.   Skilled Therapeutic Interventions/Progress Updates:  Patient supine in bed on entrance to room. Wife present and pt's eyes closed initially. Decreased attention or recognition of new voice in room and decreased eye and head turn to R side. Patient agreeable to PT session. Patient with continued reduced attention and participation in mobility this session, as reported yesterday. Pt's wife also reports the patient with reduced interaction and increased non-purposeful pushing with his L upper and lower extremities since med adjustments at the end of last week. MD and rehab team made aware.   Patient with no pain complaint throughout session. Increased tone noted in LUE.  Pt incontinent of b/b and requires change of brief and pericare prior to LB dressing for leaving room.   Therapeutic Activity: Bed Mobility: Patient performed rolling to R and L sides requiring Max/ Total A +1 to +2 and max vc/ tc for directionality, sequencing and technique.  Total A and extra time for pericare and brief change. VC/ tc required to initiate reach for contralateral hand to bedrail for attempt at pt assist.   Supine --> sit with Mod A +2 and vc/ tc for technique and sequencing in log roll technique and push up to seated position. MaxA +2 for return to supine. VC/ tc required for technique.  Neuromuscular Re-ed: NMR facilitated during session with focus on sitting balance and midline positioning. Pt guided in placement of L hand on L and then R knees during sitting balance in order to decrease push. RUE to R side with palm flat - no tone noted to hold elbow extension. Pt requiring MaxA +2 to minA to maintain throughout. Unable to follow instructions or vc/ tc. NMR performed for improvements in motor control and coordination, balance, sequencing, judgement, and self confidence/ efficacy in performing all aspects of mobility at highest level of independence.   Patient supine  in bed at end of session with brakes locked, bed alarm set, and all needs within reach. Wife present for supervision.    Therapy Documentation Precautions:  Precautions Precautions: Fall Precaution Comments: PEG with binder Restrictions Weight Bearing Restrictions: No General:   Vital Signs: Therapy Vitals Temp: 98.3 F (36.8 C) Temp Source: Oral Pulse Rate: 100 Resp: 18 BP: (!) 144/85 Patient Position (if appropriate): Lying Oxygen Therapy SpO2: 96 % O2 Device: Room Air Pain: Pain Assessment Pain Scale: 0-10 Pain Score: 0-No pain Agitated Behavior Scale: TBI Observation Details Observation  Environment: Pt's Room Start of observation period - Date: 06/08/22 Start of observation period - Time: 1415 End of observation period - Date: 06/08/22 End of observation period - Time: 1500 Agitated Behavior Scale (DO NOT LEAVE BLANKS) Short attention span, easy distractibility, inability to concentrate: Present to an extreme degree Impulsive, impatient, low tolerance for  pain or frustration: Absent Uncooperative, resistant to care, demanding: Absent Violent and/or threatening violence toward people or property: Absent Explosive and/or unpredictable anger: Absent Rocking, rubbing, moaning, or other self-stimulating behavior: Absent Pulling at tubes, restraints, etc.: Absent Wandering from treatment areas: Absent Restlessness, pacing, excessive movement: Absent Repetitive behaviors, motor, and/or verbal: Present to a slight degree (increased L hand pinch grip on bed linens and continuous increased reach with LUE and increased elbow extensor tone) Rapid, loud, or excessive talking: Absent Sudden changes of mood: Absent Easily initiated or excessive crying and/or laughter: Absent Self-abusiveness, physical and/or verbal: Absent Agitated behavior scale total score: 18  Therapy/Group: Individual Therapy  Alger Simons PT, DPT, CSRS 06/08/2022, 5:57 PM

## 2022-06-08 NOTE — Progress Notes (Signed)
Speech Language Pathology Daily Session Note  Patient Details  Name: Carl Carpenter MRN: [id number] Date of Birth: 06-11-1986  Today's Date: 06/08/2022 SLP Individual Time: 1537-9432 SLP Individual Time Calculation (min): 41 min  Short Term Goals: Week 5: SLP Short Term Goal 1 (Week 5): Pt will consume therapeutic trials of 1/2 teaspoons of thin liquids and ice chips with minimal overt s/s of aspiration over 3 consecutive sessions and max assist use of swallowing precautions prior to MBS or FEES. SLP Short Term Goal 2 (Week 5): Pt will verbalize on command in 25% of opportunities with max assist multimodal when given chocies from a field of 2. SLP Short Term Goal 3 (Week 5): Pt will answer basic yes/no questions using via gestures or verbalizations in 50% of opportunities with max assist multimodal cues over 2 sessions. SLP Short Term Goal 4 (Week 5): Pt will demonstre sustained attention on a targeted stimulus for 2 minutes with max assist multimodal cues over 2 sessions. SLP Short Term Goal 5 (Week 5): Pt will follow 1 step  commands in 75% of opportunities with max assist multimodal cues over 2 sessions. SLP Short Term Goal 6 (Week 5): Patient will initiate functional tasks with Mod A multimodal cues.  Skilled Therapeutic Interventions: Skilled treatment session focused on dysphagia and communication goals. Upon arrival, patient was awake while upright in the wheelchair. SLP facilitated session by providing Mod verbal and tactile cues for thoroughness with oral care via the suction toothbrush.  SLP administered trials of ice chips and small sips of thin via tsp. Patient with significantly delayed swallow initiation and initiated a swallow in ~2/5 trials resulting in a wet vocal quality and delayed coughing. Recommend ongoing trials with SLP. SLP also provided multimodal cues for patient to verbalize at the word level in response to basic yes/no questions without success. Patient with decreased  restlessness today but with ongoing perseverative behaviors, especially with rubbing his LUE around his mouth and chin. Patient left upright in wheelchair with alarm on and all needs within reach. Continue with current plan of care.      Pain Pain Assessment Pain Scale: 0-10 Pain Score: 0-No pain  Therapy/Group: Individual Therapy  Fredick Schlosser 06/08/2022, 12:20 PM

## 2022-06-09 DIAGNOSIS — R451 Restlessness and agitation: Secondary | ICD-10-CM

## 2022-06-09 DIAGNOSIS — I63419 Cerebral infarction due to embolism of unspecified middle cerebral artery: Secondary | ICD-10-CM

## 2022-06-09 DIAGNOSIS — I69319 Unspecified symptoms and signs involving cognitive functions following cerebral infarction: Secondary | ICD-10-CM

## 2022-06-09 DIAGNOSIS — K5900 Constipation, unspecified: Secondary | ICD-10-CM

## 2022-06-09 DIAGNOSIS — I10 Essential (primary) hypertension: Secondary | ICD-10-CM

## 2022-06-09 LAB — GLUCOSE, CAPILLARY
Glucose-Capillary: 101 mg/dL — ABNORMAL HIGH (ref 70–99)
Glucose-Capillary: 105 mg/dL — ABNORMAL HIGH (ref 70–99)
Glucose-Capillary: 109 mg/dL — ABNORMAL HIGH (ref 70–99)
Glucose-Capillary: 110 mg/dL — ABNORMAL HIGH (ref 70–99)
Glucose-Capillary: 120 mg/dL — ABNORMAL HIGH (ref 70–99)
Glucose-Capillary: 133 mg/dL — ABNORMAL HIGH (ref 70–99)

## 2022-06-09 NOTE — Progress Notes (Signed)
PROGRESS NOTE   Subjective/Complaints: Girlfriend at bedside. Cough has improved. No new concerns.    ROS: Limited to cognitive/behavioral  Objective:   No results found. Recent Labs    06/06/22 1210 06/08/22 0614  WBC 9.6 6.0  HGB 15.2 13.1  HCT 43.0 39.3  PLT 243 175        Recent Labs    06/06/22 1210  NA 140  K 4.0  CL 104  CO2 25  GLUCOSE 117*  BUN 15  CREATININE 0.83  CALCIUM 10.2         Intake/Output Summary (Last 24 hours) at 06/09/2022 1044 Last data filed at 06/09/2022 0600 Gross per 24 hour  Intake --  Output 500 ml  Net -500 ml              Physical Exam: Vital Signs Blood pressure 118/77, pulse 88, temperature 97.9 F (36.6 C), resp. rate 16, height 6\' 5"  (1.956 m), weight 102.2 kg, SpO2 95 %.  Constitutional: Not in distress. Appears well with yesterday's episode of being pale and diaphoretic resolved. Vital signs reviewed. BMI 26.80, somnolent HEENT: NCAT, EOMI, moist oral membranes Neck: supple Cardiovascular: regular rhythm, periods of intermittent tachycardia,  without murmur, No JVD. Respiratory/Chest: CTA bilaterally, without wheezes or rales.  Normal effort. GI/Abdomen: BS +4, all quadrants, non-tender, non-distended. Ext: No clubbing, cyanosis, or edema Psych: some increased restlessness, trying to swing left leg over to get out of bed on left side. Pulling sheets of his bed Musculoskeletal: No pain with ROM Skin:  Left knee skin abrasion with scar appears to focus on one object with frequent, repetitive attempts to grasp objects.  Can speak a few words on occasion, per spouse and mother. Neurological:  DTR's brisk on the right.  Mild flexor tone in right hand.  Moving right side spontaneously. Comments:  Today patient is calm and appears more focused.  Per family and nursing he has periods of restlessness in the past few days prior.  Per pt's mother, she thinks  some of the new medication changes made at the end of last week have improved his attention and restlessness symptoms. Making eye contact with and tracking examiner, with some episodes where he appears "zoned out" not tracking.  Per Mom, she thinks his periods of attention are increasing while periods of inattention are decreasing.    Assessment/Plan: 1. Functional deficits which require 3+ hours per day of interdisciplinary therapy in a comprehensive inpatient rehab setting. Physiatrist is providing close team supervision and 24 hour management of active medical problems listed below. Physiatrist and rehab team continue to assess barriers to discharge/monitor patient progress toward functional and medical goals  Care Tool:  Bathing    Body parts bathed by patient: Chest, Face, Front perineal area   Body parts bathed by helper: Right arm, Left arm, Abdomen, Right upper leg, Left upper leg, Buttocks, Left lower leg, Right lower leg     Bathing assist Assist Level: Total Assistance - Patient < 25%     Upper Body Dressing/Undressing Upper body dressing   What is the patient wearing?: Pull over shirt    Upper body assist Assist Level: Total Assistance - Patient <  25%    Lower Body Dressing/Undressing Lower body dressing      What is the patient wearing?: Pants, Underwear/pull up     Lower body assist Assist for lower body dressing: 2 Helpers (MAX A +2 bed level)     Toileting Toileting    Toileting assist Assist for toileting: 2 Helpers     Transfers Chair/bed transfer  Transfers assist  Chair/bed transfer activity did not occur: Safety/medical concerns  Chair/bed transfer assist level: Dependent - mechanical lift (stedy)     Locomotion Ambulation   Ambulation assist   Ambulation activity did not occur: Safety/medical concerns          Walk 10 feet activity   Assist  Walk 10 feet activity did not occur: Safety/medical concerns        Walk 50 feet  activity   Assist Walk 50 feet with 2 turns activity did not occur: Safety/medical concerns         Walk 150 feet activity   Assist Walk 150 feet activity did not occur: Safety/medical concerns         Walk 10 feet on uneven surface  activity   Assist Walk 10 feet on uneven surfaces activity did not occur: Safety/medical concerns         Wheelchair     Assist Is the patient using a wheelchair?: Yes Type of Wheelchair: Manual    Wheelchair assist level: Dependent - Patient 0%      Wheelchair 50 feet with 2 turns activity    Assist        Assist Level: Dependent - Patient 0%   Wheelchair 150 feet activity     Assist      Assist Level: Dependent - Patient 0%   Blood pressure 118/77, pulse 88, temperature 97.9 F (36.6 C), resp. rate 16, height 6\' 5"  (1.956 m), weight 102.2 kg, SpO2 95 %.    Medical Problem List and Plan: 1. 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  DVT right posterior tibial and peroneal veins (03/22/22): Discussed with mother that repeat doppler shows no new clots and resolution of one previous clot.  -anticoagulation: continue Eliquis             -antiplatelet therapy: none 3. Pain: continueTylenol prn 4. Behavior/sleep: LCSW to evaluate and provide emotional support             -antipsychotic agents: none  -increasing propranolol for restlessness  -continue low dose seroquel at night for restlessness/agitation  -added valproic acid 250mg  bid 6/15, titrate up as tolerated/needed---max titration would be around 500mg  tid  -if he becomes  more agitated this coming week, seroquel dose and frequency could be increased  6/17 Per nursing and spouse has had some increased agitation at night, unsure if it could be r/t increased responsiveness to stimuli?  Also, has SBP spike to 160 overnight, with intermittent tachycardia (100's). 6/18 Appears overall more  calm and less restless today, HR ranging 93-106, but not seeing SBP spikes, with SBP mostly in the 120's. 6/20- Restlessness appears decreased overall, will monitor 5. Impaired attention/cognition: This patient is not capable of making decisions on his own behalf.             - ritalin  25mg  bid -continue  6/16- adjusted sinemet to 25/100 yesterday   -stopped amantadine      -there have been notable improvements in initiation since sinemet was initiated this week. Not sure I would tolerate this much further 6/17 appears to pay attention, but also to be fixated on one thing.  For example, was continuously grasping at blanket this morning, later came back and was repeatedly grasping his left upper thigh with his left hand.  6/18 Attention and cognition are improved today, per exam and report by mother at bedside.  Continue ritalin, sinemet, Seroquel, and valproic acid at current dosages. 6. Dysphagia. S/p PEG 3/29.  --NPO -continue TF/boluses -Continue zinc oxide to protect skin surrounding PEG tube -5/30 replaced PEG under fluoro. Appreciate IR help -PEG site remains clean. Bumper has been loose on tube. Just needs to be cinched back down. They can also tape it as well. A rubber band might work better. 6/18 PEG site clean, still issues with bumper. 7. Fluids/Electrolytes/Nutrition: Routine Is and Os and follow-up chemistries             --continue tube feeds Jevity 1.5 boluses             --continue Prosource             - prealbumin 35.8, gaining weight 6/17 Will get pre-albumin on Monday labs. 6/18 Daily weights down today at 101.6 kg, trend with tomorrow's weights. Filed Weights    06/07/22 0500 06/08/22 0441 06/09/22 0500  Weight: 101.6 kg 102.5 kg 102.2 kg    9: C1 right transverse process fracture--he's out of collar 10: Left Occipital condyle fracture--out of collar 11: Acute ventilator respiratory failure requiring trach and PEG 3/29              --decannulated , stoma closed             --robitussin can be prn  -discontinued robinul via PEG      12: Right ear laceration:  healing 14: Tongue laceration: monitor healing 15: Hypertension: lopressor 50mg  bid      -hr controlled on propranolol 30mg  tid  6/16- BP controlled- con't regimen 6/17 BP controlled, intermittent tachycardia (100-110), may be due to agitation vs increased responsiveness to environmental stimuli. 6/18 SBP appears more settled today, mostly in the 120's. Suspect that elevated BP seen within past few days could be related to agitation, which has now improved. 6/20 BP appears controlled, follow    06/09/2022    5:00 AM 06/09/2022    4:36 AM 06/08/2022    7:55 PM  Vitals with BMI  Weight 225 lbs 5 oz    BMI 26.71    Systolic  118 121  Diastolic  77 88  Pulse  88 106    16. Hypoglycemic to 69: otherwise in 90s: will d/c ISS since not requiring   18. Iron deficiency:  conitnue daily iron supplement  19. Tremor,?clonus: continue trial of propranolol 10mg  tid  Increased propranolol to 30 mg tid     -see above 6/17 Continue to monitor for tremor, per spouse he had a very slight tremor or very brief duration with the past two days.  6/18 Continue propranolol 30 mg tid for tremor. 20, Constipation: ?mild  -given pattern I see, I'm not inclined to do more than increase his senna-s. Will dc colace 6/18 LBM 06/05/22, use prns if needed. 6/20 BM today  LOS: 32 days A FACE TO FACE EVALUATION WAS PERFORMED  Fanny Dance 06/09/2022, 10:44 AM

## 2022-06-09 NOTE — Progress Notes (Signed)
Mother educated on and returned demonstration of tube feedings and medication administration. Questions answered. All family members verbalize comfort with tube. Mylo Red, LPN

## 2022-06-09 NOTE — Progress Notes (Signed)
Occupational Therapy Weekly Progress Note  Patient Details  Name: Carl Carpenter MRN: [id number] Date of Birth: 1986-02-07  Beginning of progress report period: June 01, 2022 End of progress report period: June 09, 2022  Today's Date: 06/09/2022 OT Individual Time: 1300-1400 OT Individual Time Calculation (min): 60 min    Patient has met 2 of 3 short term goals.  Carl Carpenter has made good progress this week with advancements in command following, ADL transfers, vocalizations, and the emergence of behaviors more consistent with a RLAS IV, including restlessness and agitation. Family education has been initiated and is on going with his Carl Carpenter and father Paramedic. Despite advancements Carl Carpenter is still a large burden of care, requiring max +2 for self care tasks from bed level and for transfers using the stedy.  Patient continues to demonstrate the following deficits: muscle weakness, decreased cardiorespiratoy endurance, decreased coordination and decreased motor planning, decreased initiation, decreased attention, decreased awareness, decreased problem solving, decreased safety awareness, decreased memory, delayed processing, and demonstrates behaviors consistent with Rancho Level III, emerging IV, and decreased sitting balance, decreased standing balance, decreased postural control, decreased balance strategies, and difficulty maintaining precautions and therefore will continue to benefit from skilled OT intervention to enhance overall performance with BADL and Reduce care partner burden.  Patient progressing toward long term goals..  Continue plan of care.  OT Short Term Goals Week 4:  OT Short Term Goal 1 (Week 4): Pt will sit EOB with no more than mod A consistently OT Short Term Goal 1 - Progress (Week 4): Met OT Short Term Goal 2 (Week 4): Pt's caregivers will initiate transfer training in prep for d/c OT Short Term Goal 2 - Progress (Week 4): Met OT Short Term Goal 3 (Week 4): Pt will  complete oral care with mod A OT Short Term Goal 3 - Progress (Week 4): Progressing toward goal Week 5:  OT Short Term Goal 1 (Week 5): Pt's caregiver will demonstrate competency in bed level bathing and dressing OT Short Term Goal 2 (Week 5): Pt's caregivers will demonstrate competency and safety in ADL transfers using LRAD OT Short Term Goal 3 (Week 5): Pt will complete stedy transfer with no more than mod A, +2 for safety not assist  Skilled Therapeutic Interventions/Progress Updates:    Pt received supine with his SO Carl Carpenter and parents present. Discussed scheduled family edu but d/t extension they deferred more practice today and waiting until closer to new 7/7 d/c. Pt incredibly restless this session, extremely difficult to manage all extremities. He was transferred to EOB with max A. Once EOB he was able to manage with min-mod A overall. He was constantly grasping at anything he could get his hands on, therapist, his clothes, etc. He inappropriately grabbed OT several times but difficult to discern intent. He was incredibly distractible, with several family members in the room talking. Asked family to leave the room to reduce stimuli and turned down the lights. Pt was almost instantly calmed with reduction of stimuli. Gave several rest breaks from both stimuli and OT intervention. Pt required total A to stand for brief change- incontinent of urine and bowel. He required total A for hygiene. Pt had to be transferred to supine for hygiene d/t his hands and actively pushing with his LUE making standing hygiene unsafe despite +2 intervention. Pt required max +2 to transfer to the TIS w/c. He was constantly pulling at his shorts so they were removed. He was taken out of the room for scenery  change. Assisted pt in redonning pants following an extended rest break. Passed off pt to SLP in room.   ABS 33  Therapy Documentation Precautions:  Precautions Precautions: Fall Precaution Comments: PEG with  binder Restrictions Weight Bearing Restrictions: No  Therapy/Group: Individual Therapy  Carl Carpenter 06/09/2022, 6:33 AM

## 2022-06-09 NOTE — Progress Notes (Addendum)
Nutrition Follow-up  DOCUMENTATION CODES:   Non-severe (moderate) malnutrition in context of acute illness/injury  INTERVENTION:   Continue Tube feed via PEG:  2 containers of Osmolite 1.5 - QID 45 mL ProSource TF - QID 50 mL free water before and after each feeding Provides: 3004 kcal, 164 grams protein, and 1448 mL free water (1848 mL total free water).   NUTRITION DIAGNOSIS:   Moderate Malnutrition related to acute illness as evidenced by moderate fat depletion, moderate muscle depletion. - Bring addressed via TF  GOAL:   Patient will meet greater than or equal to 90% of their needs - Being met via TF  MONITOR:   TF tolerance, Weight trends, I & O's  REASON FOR ASSESSMENT:   New TF    ASSESSMENT:   Pt with recent hospitalization 3/21 - 5/19 for severe TBI after [facility].  Pt in therapy at time of RD visit.  Discussed case with RN outside of room. RN reports that pt has been tolerating well. No signs of intolerance, no signs that volume is too much. States that family has demonstrated TF administration well.   Pt weight continues to be stable.   Medications reviewed and include: Ferrous Sulfate, Senokot Labs reviewed.  Diet Order:   Diet Order             Diet NPO time specified  Diet effective now                   EDUCATION NEEDS:   Not appropriate for education at this time  Skin:  Skin Assessment: Reviewed RN Assessment  Last BM:  6/19  Height:  Ht Readings from Last 1 Encounters:  05/11/22 6' 5"  (1.956 m)   Weight:  Wt Readings from Last 1 Encounters:  06/09/22 102.2 kg   BMI:  Body mass index is 26.72 kg/m.  Estimated Nutritional Needs:  Kcal:  2800-3000 Protein:  150-170 grams Fluid:  >2.8 L/day   Hermina Barters RD, LDN Clinical Dietitian See [facility] for contact information.

## 2022-06-09 NOTE — Patient Care Conference (Signed)
Inpatient RehabilitationTeam Conference and Plan of Care Update Date: 06/09/2022   Time: 10:44 AM    [name]      Medical Record Number: [id number]  Date of Birth: 1986-05-07 Sex: Male         Room/Bed: 4W14C/4W14C-01 Payor Info: Payor: [place] MEDICAID PREPAID HEALTH PLAN / Plan: [place] MEDICAID [employer] / Product Type: *No Product type* /    Admit Date/Time:  05/08/2022  3:31 PM  Primary Diagnosis:  TBI (traumatic brain injury) [facility])  Hospital Problems: Principal Problem:   TBI (traumatic brain injury) [facility]) Active Problems:   Malnutrition of moderate degree    Expected Discharge Date: Expected Discharge Date: 06/26/22  Team Members Present: Physician leading conference: Dr. Fanny Dance Social Worker Present: Cecile Sheerer, LCSWA Nurse Present: Kennyth Arnold, RN PT Present: Malachi Pro, PT OT Present: Kearney Hard, OT SLP Present: Feliberto Gottron, SLP PPS Coordinator present : Fae Pippin, SLP     Current Status/Progress Goal Weekly Team Focus  Bowel/Bladder   Incontinent of B/B  Regain continence  Assist with toileting as needed   Swallow/Nutrition/ Hydration   NPO with PEG, consistent overt s/s of aspiration with trials  Mod A with least restrictive diet  ongoing trials with focus on oral acceptance, manipulation and swallow initiation   ADL's   Max to total A +2, slightly improved initiation, but much more restless this week, Rancho IV  mod-max A overall.  Pt/family education, initiation, transfers, self-care retraining   Mobility   min-mod A of 1 bed mobility and mod A +2 sit to stand and gait >100 ft, 4 steps using B rails +3 assist for safety; increased assist needed the last 2 days with reduced attention, arousal, and participation from patient  maxA bed level  sitting balance, attention, functional mobility, therapeutic gait trianing, family education   Communication   Max-Total A for multimodal communication  Mod A   verbalizing wants/needs at word level   Safety/Cognition/ Behavioral Observations  Rancho Level IV, Max-Total A  Mod A  sustained attention, initiation, following commnds   Pain   No signs of pain  Continue to show no signs of pain  Assess Qshift and prn   Skin   Redness around Ped and upper lip.  No new breakdown  Assess Qshift and prn     Discharge Planning:  Pt now has [facility] Medicaid. Pt will d/c to home with significant other and will have 24/7 care from her, and PRN support from other family members. Fam mtg held Thursday (6/15) at 10am with family to discuss care needs. Fam edu schedule provided to family for this week (6/19-6/23).   Team Discussion: Monitoring blood pressure and constipation. About the same cognitively. Overstimulates with too many people. Medications adjusted end of last week, unsure if too much adjustment. Incontinent B/B, Tylenol PRN. Family meeting last week, family education with family this week. Will place [facility] consult.  Patient on target to meet rehab goals: Mod/max assist goals. Still total assist.  *See Care Plan and progress notes for long and short-term goals.   Revisions to Treatment Plan:  Adjusting medications, extended LOS.   Teaching Needs: Family education, medication management, bowel/bladder management, safety awareness, transfer training, etc.   Current Barriers to Discharge: Incontinence, Insurance for SNF coverage, and Behavior  Possible Resolutions to Barriers: Family education SNF placement Order recommended DME     Medical Summary Current Status: Constipation, anemia, HTN, dysphagia, agitation  Barriers to Discharge: Behavior;Incontinence;Medical stability;Nutrition means  Barriers to Discharge Comments: Constipation, anemia, HTN, dysphagia, agitation Possible Resolutions to [employer] Focus: continue PEG feeds adjust meds for agitation, meds for constipation, follow BP, monitor lab data   Continued Need for Acute  Rehabilitation Level of Care: The patient requires daily medical management by a physician with specialized training in physical medicine and rehabilitation for the following reasons: Direction of a multidisciplinary physical rehabilitation program to maximize functional independence : Yes Medical management of patient stability for increased activity during participation in an intensive rehabilitation regime.: Yes Analysis of laboratory values and/or radiology reports with any subsequent need for medication adjustment and/or medical intervention. : Yes   I attest that I was present, lead the team conference, and concur with the assessment and plan of the team.   Tennis Must 06/09/2022, 2:17 PM

## 2022-06-09 NOTE — Progress Notes (Signed)
Patient ID: Carl Carpenter, male   DOB: 12/25/1985, 36 y.o.   MRN: [id number]  SW met with pt and pt family in room to inform on change in d/c date to 7/7. Family already aware as therapy shared.   Cecile Sheerer, MSW, LCSWA Office: [phone] Cell: [phone] Fax: [phone]

## 2022-06-09 NOTE — Progress Notes (Signed)
Physical Therapy TBI Note  Patient Details  Name: Carl Carpenter MRN: [id number] Date of Birth: 12/11/1986  Today's Date: 06/09/2022 PT Individual Time: 1107-1204 and 1500-1530 PT Individual Time Calculation (min): 57 min and 30 minutes  Short Term Goals: Week 5:  PT Short Term Goal 1 (Week 5): Pt will complete bed mobility with modA +1. PT Short Term Goal 2 (Week 5): Pt will complete bed to chair transfer with modA +1. PT Short Term Goal 3 (Week 5): Pt will ambulate x100' with modA +2 and LRAD.  Skilled Therapeutic Interventions/Progress Updates:     1st Session: Pt received supine in bed and in no apparent distress or pain. Significant other, [name], reports pt has "taken a step back since last week". She reports pt has required increased assistance for bed mobility and has not responded as consistently to verbal cueing. Pt noted to have soiled brief. Pt performs bilateral rolling with maxA +2 to assist with changing brief and donning clean pants and shoes. Supine to sit with maxA +2. Pt performs stand pivot transfer to [facility] with maxA +2 and cues for sequencing and positioning. WC transport to gym for time management. Pt performs stand pivot transfer to mat with maxA +2 and cues for initiation and sequencing. Pt works on sitting balance seated at edge of mat, with fluctuating levels of assistance required for balance, from maxA +2 due to retropulsion to CGA/minA +1 with assistance for hand placement to broaden BOS, and cues for posture. Platform placed between legs to promote anterior trunk lean and to give pt surface to WB through and reach forward with both hands. Without cueing, pt grabs opposite end of platform and lifts platform up in unsafe manner. PT removes platform for safety. Pt then performs sit to stand with maxA +2, 3 musketeers technique, and ambulates x30' with maxA +2, with facilitation of lateral weight shifting as well as tactile cueing at trunk to maintain upright posture. Pt  actually progresses R lower extremity with less assistance than L, routinely resisting motion with L leg, and eventually requiring +3 to bring WC for safety.  Pt assisted with maxA +2 into quadruped on mat table. Performed for NMR and WB through bilateral upper and lower extremities, as well as work on core control and balance. Pt able to remain in quadruped ~5 minutes with modA +2 overall, with manual facilitation of hand placement for optimal support as well as tactile facilitation of hip extension.   Pt fatigues and lowers self onto chest, requiring maxA +2 to transition from prone to supine for rest break. Supine to sit and stand pivot transfer form mat>WC>bed with maxA +2. Left supine with alarm intact and all needs within reach.  2nd Session: Pt received seated in [facility] with family and RN present. Staff and family attempting to assist pt to toilet and pt appears to be having difficulty coordinating movements to make transfer. No complaint of pain. Pt L leg in full extension and resistant to cueing to position in [place]. Stedy deferred for now. Pt performs stand pivot transfer to toilet with maxA +2 and cues for sequencing. On BSC, pt positioned with bilateral upper extremities propped on PT's legs to facilitate anterior trunk lean and posture to assist with BM. Pt stands from [facility] and had been continent of small amount in toilet, but continues to have bowel movement in standing. PT and tech provide MaxA +2 to maintain balance with 3 musketeers technique while pt finishes BM and RN and family assist  with pericare. Pt ambulates to bed, x5', with maxA +2. Sit to supine with totalA +2 and cues for sequencing. Pt left supine with alarm intact and all needs within reach. Pt misses 15 minutes of skilled PT due to fatigue.   Therapy Documentation Precautions:  Precautions Precautions: Fall Precaution Comments: PEG with binder Restrictions Weight Bearing Restrictions: No   Therapy/Group: Individual  Therapy  [name], PT, DPT 06/09/2022, 5:51 PM

## 2022-06-09 NOTE — Progress Notes (Signed)
Speech Language Pathology TBI Note  Patient Details  Name: Carl Carpenter MRN: [id number] Date of Birth: 03-Mar-1986  Today's Date: 06/09/2022 SLP Individual Time: 6144-3154 SLP Individual Time Calculation (min): 30 min  Short Term Goals: Week 5: SLP Short Term Goal 1 (Week 5): Pt will consume therapeutic trials of 1/2 teaspoons of thin liquids and ice chips with minimal overt s/s of aspiration over 3 consecutive sessions and max assist use of swallowing precautions prior to MBS or FEES. SLP Short Term Goal 2 (Week 5): Pt will verbalize on command in 25% of opportunities with max assist multimodal when given chocies from a field of 2. SLP Short Term Goal 3 (Week 5): Pt will answer basic yes/no questions using via gestures or verbalizations in 50% of opportunities with max assist multimodal cues over 2 sessions. SLP Short Term Goal 4 (Week 5): Pt will demonstre sustained attention on a targeted stimulus for 2 minutes with max assist multimodal cues over 2 sessions. SLP Short Term Goal 5 (Week 5): Pt will follow 1 step  commands in 75% of opportunities with max assist multimodal cues over 2 sessions. SLP Short Term Goal 6 (Week 5): Patient will initiate functional tasks with Mod A multimodal cues.  Skilled Therapeutic Interventions: Skilled treatment session focused on dysphagia goals and ongoing family education. SLP facilitated session by providing Max hand over hand assist for patient to initiate oral care via the suction toothbrush. Total A was needed for thoroughness. Patient with decreased management of secretions today resulting in coughing and oral expectoration. Patient given trials of ice chips with prolonged oral manipulation noted but without observation of a initiation of a swallow. Patient's family present and educated regarding cues to encourage management of secretions and current plan of care. All verbalized understanding. Despite Max A multimodal cues and multiple attempts from SLP  and family, patient unable to verbalize in response to questions. Patient handed off to nursing. Patient left upright in wheelchair with family present. Continue with current plan of care.       Pain No indications of pain   Agitated Behavior Scale: TBI Observation Details Observation Environment: CIR Start of observation period - Date: 06/09/22 Start of observation period - Time: 1405 End of observation period - Date: 06/09/22 End of observation period - Time: 1435 Agitated Behavior Scale (DO NOT LEAVE BLANKS) Short attention span, easy distractibility, inability to concentrate: Present to an extreme degree Impulsive, impatient, low tolerance for pain or frustration: Absent Uncooperative, resistant to care, demanding: Absent Violent and/or threatening violence toward people or property: Absent Explosive and/or unpredictable anger: Absent Rocking, rubbing, moaning, or other self-stimulating behavior: Absent Pulling at tubes, restraints, etc.: Present to a moderate degree Wandering from treatment areas: Absent Restlessness, pacing, excessive movement: Present to a slight degree Repetitive behaviors, motor, and/or verbal: Present to a moderate degree Rapid, loud, or excessive talking: Absent Sudden changes of mood: Absent Easily initiated or excessive crying and/or laughter: Absent Self-abusiveness, physical and/or verbal: Absent Agitated behavior scale total score: 22  Therapy/Group: Individual Therapy  Joda Braatz 06/09/2022, 2:38 PM

## 2022-06-10 DIAGNOSIS — S069X4S Unspecified intracranial injury with loss of consciousness of 6 hours to 24 hours, sequela: Secondary | ICD-10-CM

## 2022-06-10 DIAGNOSIS — Z515 Encounter for palliative care: Secondary | ICD-10-CM

## 2022-06-10 LAB — GLUCOSE, CAPILLARY
Glucose-Capillary: 103 mg/dL — ABNORMAL HIGH (ref 70–99)
Glucose-Capillary: 104 mg/dL — ABNORMAL HIGH (ref 70–99)
Glucose-Capillary: 105 mg/dL — ABNORMAL HIGH (ref 70–99)
Glucose-Capillary: 122 mg/dL — ABNORMAL HIGH (ref 70–99)
Glucose-Capillary: 130 mg/dL — ABNORMAL HIGH (ref 70–99)
Glucose-Capillary: 97 mg/dL (ref 70–99)

## 2022-06-10 MED ORDER — VALPROIC ACID 250 MG/5ML PO SOLN
250.0000 mg | Freq: Every day | ORAL | Status: DC
Start: 2022-06-10 — End: 2022-06-12
  Administered 2022-06-10 – 2022-06-11 (×2): 250 mg
  Filled 2022-06-10 (×2): qty 5
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
## 2022-06-10 NOTE — Progress Notes (Signed)
Patient ID: Carl Carpenter, male   DOB: 1986/07/09, 36 y.o.   MRN: [id number]  [name]'s address (Discharge address): [address], [place], [place] [address]  SW left message for [facility] Medicaid/Case Management Dept [phone]) to discuss PCS referral and requested return phone call. SW faxed PCS order.   SW left message for [employer] [phone]) with CAP/DA program and if pt eligible given not traditional Medicaid.  *SW returned phone call to Ladona Ridgel to discuss. States ptp may possibly be eligible. SW submitted referral, and reports a packet will come in the mail to family home within 7-10 business days.   Cecile Sheerer, MSW, LCSWA Office: [phone] Cell: [phone] Fax: [phone]

## 2022-06-10 NOTE — Progress Notes (Signed)
Physical Therapy TBI Note  Patient Details  Name: Carl Carpenter MRN: [id number] Date of Birth: 02/09/1986  Today's Date: 06/10/2022 PT Individual Time: 6812-7517 and 0017-4944 PT Individual Time Calculation (min): 44 min and 44 min  Short Term Goals: Week 5:  PT Short Term Goal 1 (Week 5): Pt will complete bed mobility with modA +1. PT Short Term Goal 2 (Week 5): Pt will complete bed to chair transfer with modA +1. PT Short Term Goal 3 (Week 5): Pt will ambulate x100' with modA +2 and LRAD.  Skilled Therapeutic Interventions/Progress Updates:     Pt received supine in bed receiving RN care to change brief. No apparent pain or distress. Pt performs bilateral rolling with maxA +1 and cues for body mechanics to assist with donning abdominal binder. Supine to sit with maxA +2 and cues for sequencing and positioning. Sit to stand and stand pivot transfer to [facility] with cues for initiation, body mechanics and hand placement, with maxA +2. Stand pivot to mat table with maxA +1 and same cueing. Pt works on sitting balance in short sitting on edge of mat, primarily requiring at least modA +1 and as much as modA +2 when pt retropulses or pushes firmly with L lower extremity. Pt requires as little as CGA for up to 15 seconds at a time when PT facilitates optimal posture with anterior pelvic tilt, upright gaze, and broad BOS with hands and feet. Pt attempts ambulation with 3 musketeers technique but is unable to safely step away from mat due to L lower extremity resisting movement. Activity adjusted for safety and pt guided to semi reclined position on wedge to promote soft tissue stretch of anterior trunk muscle groups, as well as hip flexor stretch with feet positioned on floor. Pt appears fairly relaxed in this position and additional tactile cueing provided at muscle groups for increased soft tissue stretch. Pt assisted back to sitting and stand pivot back to WC with maxA +2. Left seated in WC with alarm intact  and all needs within reach.  2nd Session: Pt received supine in bed and in no apparent pain or distress. Supine to sit with maxA +1 and cues for hand placement and sequencing. Stand pivot transfer to [facility] with maxA +1 and CGA +2, with cues for hand placement, initiation, and sequencing. WC transport to gym for time management. Pt ambulates 2x40' with maxA +2, 3 musketeers technique. Multimodal cues provided for upright gaze to improve posture and balance, lateral weight shifting and progression of reciprocal gait pattern. Pt requires +3 for WC follow for safety on 2nd bout. WC transport back to room. Stand pivot back to bed with maxA +2. Sit to supine with maxA +2 and cues for positioning and body mechanics. Left supine with alarm intact and all needs within reach.  Therapy Documentation Precautions:  Precautions Precautions: Fall Precaution Comments: PEG with binder Restrictions Weight Bearing Restrictions: No    Therapy/Group: Individual Therapy  [name], PT, DPT 06/10/2022, 4:58 PM

## 2022-06-10 NOTE — Progress Notes (Signed)
Occupational Therapy TBI Note  Patient Details  Name: Carl Carpenter MRN: [id number] Date of Birth: 02-06-1986  Today's Date: 06/10/2022 OT Individual Time: 6812-7517 OT Individual Time Calculation (min): 60 min    Short Term Goals: Week 5:  OT Short Term Goal 1 (Week 5): Pt's caregiver will demonstrate competency in bed level bathing and dressing OT Short Term Goal 2 (Week 5): Pt's caregivers will demonstrate competency and safety in ADL transfers using LRAD OT Short Term Goal 3 (Week 5): Pt will complete stedy transfer with no more than mod A, +2 for safety not assist  Skilled Therapeutic Interventions/Progress Updates:    Pt received sitting in the [facility] with no indications of pain, his SO [name] and father present. Extensive discussion and education provided re TBI progression, behaviors, RLAS III-IV expectations, and discharge planning. Pt stood at the sink with max +2 assist to initiate stand and then once he was standing he required only mod A to remain standing. Brief checked to ensure he was clean. He was taken via w/c to the therapy gym in the parallel bars. Session focused on upright posture, standing tolerance/balance, and functional mobility to promote improved trunk control and attention to task. He required max +3 to initiate stand and then he was able to reciprocally step with BLE x6 steps for 3 trials. He required an extended rest break between each set, as well as max cueing for attention/sequencing, and motor planning. [name] stayed in front of the pt and this was very successful in maintaining his attention. Facilitation was also required at his L ankle laterally to reduce inversion. Pt returned to his room and completed a squat pivot transfer back to his bed. Brief change d/t incontinent urine. Pt left supine with all needs met, bed alarm set.   Therapy Documentation Precautions:  Precautions Precautions: Fall Precaution Comments: PEG with binder Restrictions Weight  Bearing Restrictions: No Pain Assessment Pain Scale: Faces Pain Score: 0-No pain Agitated Behavior Scale: TBI Observation Details Observation Environment: CIR Start of observation period - Date: 06/10/22 Start of observation period - Time: 1300 End of observation period - Date: 06/10/22 End of observation period - Time: 1400 Agitated Behavior Scale (DO NOT LEAVE BLANKS) Short attention span, easy distractibility, inability to concentrate: Present to a moderate degree Impulsive, impatient, low tolerance for pain or frustration: Present to a moderate degree Uncooperative, resistant to care, demanding: Present to a moderate degree Violent and/or threatening violence toward people or property: Absent Explosive and/or unpredictable anger: Absent Rocking, rubbing, moaning, or other self-stimulating behavior: Present to a slight degree Pulling at tubes, restraints, etc.: Present to a slight degree Wandering from treatment areas: Absent Restlessness, pacing, excessive movement: Present to an extreme degree Repetitive behaviors, motor, and/or verbal: Present to a slight degree Rapid, loud, or excessive talking: Absent Sudden changes of mood: Absent Easily initiated or excessive crying and/or laughter: Absent Self-abusiveness, physical and/or verbal: Absent Agitated behavior scale total score: 26   Therapy/Group: Individual Therapy  [name] 06/10/2022, 2:44 PM

## 2022-06-10 NOTE — Progress Notes (Signed)
Orthopedic Tech Progress Note Patient Details:  Carl Carpenter 10-15-1986 [id number]  Ortho Devices Type of Ortho Device: Abdominal binder Ortho Device/Splint Location: stomach Ortho Device/Splint Interventions: Ordered   Post Interventions Patient Tolerated: Well Instructions Provided: Care of device  Carl Carpenter 06/10/2022, 2:39 PM

## 2022-06-10 NOTE — Progress Notes (Signed)
PROGRESS NOTE   Subjective/Complaints: Father at bedside. Pt initially asleep in AM but later seen awake working with therapy.  Less engaged with therapy this week.    ROS: Limited to cognitive/behavioral  Objective:   No results found. Recent Labs    06/08/22 0614  WBC 6.0  HGB 13.1  HCT 39.3  PLT 175        No results for input(s): "NA", "K", "CL", "CO2", "GLUCOSE", "BUN", "CREATININE", "CALCIUM" in the last 72 hours.      No intake or output data in the 24 hours ending 06/10/22 1419            Physical Exam: Vital Signs Blood pressure 125/85, pulse 92, temperature 97.9 F (36.6 C), temperature source Axillary, resp. rate 18, height 6\' 5"  (1.956 m), weight 101.7 kg, SpO2 97 %.  Constitutional: Not in distress. Appears well with yesterday's episode of being pale and diaphoretic resolved. Vital signs reviewed. BMI 26.80, somnolent HEENT: NCAT, EOMI, moist oral membranes Neck: supple Cardiovascular: regular rhythm, periods of intermittent tachycardia,  without murmur, No JVD. Respiratory/Chest: CTA bilaterally, without wheezes or rales.  Normal effort. GI/Abdomen: BS +4, all quadrants, non-tender, non-distended. Ext: No clubbing, cyanosis, or edema Psych: some increased restlessness, trying to swing left leg over to get out of bed on left side. Pulling sheets of his bed Musculoskeletal: No pain with ROM Skin:  Left knee skin abrasion with scar appears to focus on one object with frequent, repetitive attempts to grasp objects.  Can speak a few words on occasion, per spouse and mother. Neurological:  DTR's brisk on the right.  Mild flexor tone in right hand.  Moving right side spontaneously. Comments:  Today patient is calm and appears more focused.  Per family and nursing he has periods of restlessness in the past few days prior.   Intermittently tracking people.    Assessment/Plan: 1. Functional  deficits which require 3+ hours per day of interdisciplinary therapy in a comprehensive inpatient rehab setting. Physiatrist is providing close team supervision and 24 hour management of active medical problems listed below. Physiatrist and rehab team continue to assess barriers to discharge/monitor patient progress toward functional and medical goals  Care Tool:  Bathing    Body parts bathed by patient: Chest, Face, Front perineal area   Body parts bathed by helper: Right arm, Left arm, Abdomen, Right upper leg, Left upper leg, Buttocks, Left lower leg, Right lower leg     Bathing assist Assist Level: Total Assistance - Patient < 25%     Upper Body Dressing/Undressing Upper body dressing   What is the patient wearing?: Pull over shirt    Upper body assist Assist Level: Total Assistance - Patient < 25%    Lower Body Dressing/Undressing Lower body dressing      What is the patient wearing?: Pants, Underwear/pull up     Lower body assist Assist for lower body dressing: 2 Helpers (MAX A +2 bed level)     Toileting Toileting    Toileting assist Assist for toileting: 2 Helpers     Transfers Chair/bed transfer  Transfers assist  Chair/bed transfer activity did not occur: Safety/medical concerns  Chair/bed transfer  assist level: Dependent - mechanical lift (stedy)     Locomotion Ambulation   Ambulation assist   Ambulation activity did not occur: Safety/medical concerns          Walk 10 feet activity   Assist  Walk 10 feet activity did not occur: Safety/medical concerns        Walk 50 feet activity   Assist Walk 50 feet with 2 turns activity did not occur: Safety/medical concerns         Walk 150 feet activity   Assist Walk 150 feet activity did not occur: Safety/medical concerns         Walk 10 feet on uneven surface  activity   Assist Walk 10 feet on uneven surfaces activity did not occur: Safety/medical concerns          Wheelchair     Assist Is the patient using a wheelchair?: Yes Type of Wheelchair: Manual    Wheelchair assist level: Dependent - Patient 0%      Wheelchair 50 feet with 2 turns activity    Assist        Assist Level: Dependent - Patient 0%   Wheelchair 150 feet activity     Assist      Assist Level: Dependent - Patient 0%   Blood pressure 125/85, pulse 92, temperature 97.9 F (36.6 C), temperature source Axillary, resp. rate 18, height 6\' 5"  (1.956 m), weight 101.7 kg, SpO2 97 %.    Medical Problem List and Plan: 1. 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  DVT right posterior tibial and peroneal veins (03/22/22): Discussed with mother that repeat doppler shows no new clots and resolution of one previous clot.  -anticoagulation: continue Eliquis             -antiplatelet therapy: none 3. Pain: continueTylenol prn 4. Behavior/sleep: LCSW to evaluate and provide emotional support             -antipsychotic agents: none  -increasing propranolol for restlessness  -continue low dose seroquel at night for restlessness/agitation  -added valproic acid 250mg  bid 6/15, titrate up as tolerated/needed---max titration would be around 500mg  tid  -if he becomes more agitated this coming week, seroquel dose and frequency could be increased   -Change valproic acid to QHS  6/17 Per nursing and spouse has had some increased agitation at night, unsure if it could be r/t increased responsiveness to stimuli?  Also, has SBP spike to 160 overnight, with intermittent tachycardia (100's). 6/18 Appears overall more  calm and less restless today, HR ranging 93-106, but not seeing SBP spikes, with SBP mostly in the 120's. 6/20- Restlessness appears decreased overall, will monitor 5. Impaired attention/cognition: This patient is not capable of making decisions on his own behalf.             - ritalin  25mg  bid -continue  6/16- adjusted sinemet to 25/100 yesterday   -stopped amantadine      -  there have been notable improvements in initiation since sinemet was initiated this week. Not sure I would tolerate this much further 6/17 appears to pay attention, but also to be fixated on one thing.  For example, was continuously grasping at blanket this morning, later came back and was repeatedly grasping his left upper thigh with his left hand.  6/18 Attention and cognition are improved today, per exam and report by mother at bedside.  Continue ritalin, sinemet, Seroquel, and valproic acid at current dosages. 6/21 Decrease valproic acid to QHS 6. Dysphagia. S/p PEG 3/29.  --NPO -continue TF/boluses -Continue zinc oxide to protect skin surrounding PEG tube -5/30 replaced PEG under fluoro. Appreciate IR help -PEG site remains clean. Bumper has been loose on tube. Just needs to be cinched back down. They can also tape it as well. A rubber band might work better. 6/18 PEG site clean, still issues with bumper. 7. Fluids/Electrolytes/Nutrition: Routine Is and Os and follow-up chemistries             --continue tube feeds Jevity 1.5 boluses             --continue Prosource             - prealbumin 35.8, gaining weight 6/17 Will get pre-albumin on Monday labs. 6/18 Daily weights down today at 101.6 kg, trend with tomorrow's weights. 6/21 today 101.7kg, stable overall continue to follow Filed Weights   06/08/22 0441 06/09/22 0500 06/10/22 0430  Weight: 102.5 kg 102.2 kg 101.7 kg    9: C1 right transverse process fracture--he's out of collar 10: Left Occipital condyle fracture--out of collar 11: Acute ventilator  respiratory failure requiring trach and PEG 3/29              --decannulated , stoma closed             --robitussin can be prn  -discontinued robinul via PEG      12: Right ear laceration:  healing 14: Tongue laceration: monitor healing 15: Hypertension: lopressor 50mg  bid      -hr controlled on propranolol 30mg  tid  6/16- BP controlled- con't regimen 6/17 BP controlled, intermittent tachycardia (100-110), may be due to agitation vs increased responsiveness to environmental stimuli. 6/18 SBP appears more settled today, mostly in the 120's. Suspect that elevated BP seen within past few days could be related to agitation, which has now improved. 6/21 BP continues to be well controlled    06/10/2022    4:30 AM 06/10/2022    4:26 AM 06/09/2022    9:00 PM  Vitals with BMI  Weight 224 lbs 3 oz    BMI [id number]    Systolic  0000000 [id number]  Diastolic  85 96  Pulse  92 88    16. Hypoglycemic to 69: otherwise in 90s: will d/c ISS since not requiring   18. Iron deficiency:  conitnue daily iron supplement  19. Tremor,?clonus: continue trial of propranolol 10mg  tid  Increased propranolol to 30 mg tid     -see above 6/17 Continue to monitor for tremor, per spouse he had a very slight tremor or very brief duration with the past two days. 6/18 Continue propranolol 30 mg tid for tremor. 20, Constipation: ?mild  -given pattern I see, I'm not inclined to do more than increase his senna-s. Will dc colace 6/18 LBM 06/05/22, use prns if needed. 6/21 BM today and yesterday  LOS: 33 days A FACE TO [place] 06/10/2022, 2:19 PM

## 2022-06-10 NOTE — Progress Notes (Incomplete)
PROGRESS NOTE   Subjective/Complaints: Girlfriend at bedside. Cough has improved. No new concerns.    ROS: Limited to cognitive/behavioral  Objective:   No results found. Recent Labs    06/08/22 0614  WBC 6.0  HGB 13.1  HCT 39.3  PLT 175        No results for input(s): "NA", "K", "CL", "CO2", "GLUCOSE", "BUN", "CREATININE", "CALCIUM" in the last 72 hours.      No intake or output data in the 24 hours ending 06/10/22 0807            Physical Exam: Vital Signs Blood pressure 125/85, pulse 92, temperature 97.9 F (36.6 C), temperature source Axillary, resp. rate 18, height 6\' 5"  (1.956 m), weight 101.7 kg, SpO2 97 %.  Constitutional: Not in distress. Appears well with yesterday's episode of being pale and diaphoretic resolved. Vital signs reviewed. BMI 26.80, somnolent HEENT: NCAT, EOMI, moist oral membranes Neck: supple Cardiovascular: regular rhythm, periods of intermittent tachycardia,  without murmur, No JVD. Respiratory/Chest: CTA bilaterally, without wheezes or rales.  Normal effort. GI/Abdomen: BS +4, all quadrants, non-tender, non-distended. Ext: No clubbing, cyanosis, or edema Psych: some increased restlessness, trying to swing left leg over to get out of bed on left side. Pulling sheets of his bed Musculoskeletal: No pain with ROM Skin:  Left knee skin abrasion with scar appears to focus on one object with frequent, repetitive attempts to grasp objects.  Can speak a few words on occasion, per spouse and mother. Neurological:  DTR's brisk on the right.  Mild flexor tone in right hand.  Moving right side spontaneously. Comments:  Today patient is calm and appears more focused.  Per family and nursing he has periods of restlessness in the past few days prior.  Per pt's mother, she thinks some of the new medication changes made at the end of last week have improved his attention and restlessness  symptoms. Making eye contact with and tracking examiner, with some episodes where he appears "zoned out" not tracking.  Per Mom, she thinks his periods of attention are increasing while periods of inattention are decreasing.    Assessment/Plan: 1. Functional deficits which require 3+ hours per day of interdisciplinary therapy in a comprehensive inpatient rehab setting. Physiatrist is providing close team supervision and 24 hour management of active medical problems listed below. Physiatrist and rehab team continue to assess barriers to discharge/monitor patient progress toward functional and medical goals  Care Tool:  Bathing    Body parts bathed by patient: Chest, Face, Front perineal area   Body parts bathed by helper: Right arm, Left arm, Abdomen, Right upper leg, Left upper leg, Buttocks, Left lower leg, Right lower leg     Bathing assist Assist Level: Total Assistance - Patient < 25%     Upper Body Dressing/Undressing Upper body dressing   What is the patient wearing?: Pull over shirt    Upper body assist Assist Level: Total Assistance - Patient < 25%    Lower Body Dressing/Undressing Lower body dressing      What is the patient wearing?: Pants, Underwear/pull up     Lower body assist Assist for lower body dressing: 2 Helpers (MAX  A +2 bed level)     Toileting Toileting    Toileting assist Assist for toileting: 2 Helpers     Transfers Chair/bed transfer  Transfers assist  Chair/bed transfer activity did not occur: Safety/medical concerns  Chair/bed transfer assist level: Dependent - mechanical lift (stedy)     Locomotion Ambulation   Ambulation assist   Ambulation activity did not occur: Safety/medical concerns          Walk 10 feet activity   Assist  Walk 10 feet activity did not occur: Safety/medical concerns        Walk 50 feet activity   Assist Walk 50 feet with 2 turns activity did not occur: Safety/medical concerns          Walk 150 feet activity   Assist Walk 150 feet activity did not occur: Safety/medical concerns         Walk 10 feet on uneven surface  activity   Assist Walk 10 feet on uneven surfaces activity did not occur: Safety/medical concerns         Wheelchair     Assist Is the patient using a wheelchair?: Yes Type of Wheelchair: Manual    Wheelchair assist level: Dependent - Patient 0%      Wheelchair 50 feet with 2 turns activity    Assist        Assist Level: Dependent - Patient 0%   Wheelchair 150 feet activity     Assist      Assist Level: Dependent - Patient 0%   Blood pressure 125/85, pulse 92, temperature 97.9 F (36.6 C), temperature source Axillary, resp. rate 18, height 6\' 5"  (1.956 m), weight 101.7 kg, SpO2 97 %.    Medical Problem List and Plan: 1. 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  DVT right posterior tibial and peroneal veins (03/22/22): Discussed with mother that repeat doppler shows no new clots and resolution of one previous clot.  -anticoagulation: continue Eliquis             -antiplatelet therapy: none 3. Pain: continueTylenol prn 4. Behavior/sleep: LCSW to evaluate and provide emotional support             -antipsychotic agents: none  -increasing propranolol for restlessness  -continue low dose seroquel at night for restlessness/agitation  -added valproic acid 250mg  bid 6/15, titrate up as tolerated/needed---max titration would be around 500mg  tid  -if he becomes more agitated this coming week, seroquel dose and frequency could be  increased  6/17 Per nursing and spouse has had some increased agitation at night, unsure if it could be r/t increased responsiveness to stimuli?  Also, has SBP spike to 160 overnight, with intermittent tachycardia (100's). 6/18 Appears overall more calm and less restless today, HR ranging 93-106, but not seeing SBP spikes, with SBP mostly in the 120's. 6/20- Restlessness appears decreased overall, will monitor 5. Impaired attention/cognition: This patient is not capable of  making decisions on his own behalf.             - ritalin  25mg  bid -continue  6/16- adjusted sinemet to 25/100 yesterday   -stopped amantadine      -there have been notable improvements in initiation since sinemet was initiated this week. Not sure I would tolerate this much further 6/17 appears to pay attention, but also to be fixated on one thing.  For example, was continuously grasping at blanket this morning, later came back and was repeatedly grasping his left upper thigh with his left hand.  6/18 Attention and cognition are improved today, per exam and report by mother at bedside.  Continue ritalin, sinemet, Seroquel, and valproic acid at current dosages. 6. Dysphagia. S/p PEG 3/29.  --NPO -continue TF/boluses -Continue zinc oxide to protect skin surrounding PEG tube -5/30 replaced PEG under fluoro. Appreciate IR help -PEG site remains clean. Bumper has been loose on tube. Just needs to be cinched back down. They can also tape it as well. A rubber band might work better. 6/18 PEG site clean, still issues with bumper. 7. Fluids/Electrolytes/Nutrition: Routine Is and Os and follow-up chemistries             --continue tube feeds Jevity 1.5 boluses             --continue Prosource             - prealbumin 35.8, gaining weight 6/17 Will get pre-albumin on Monday labs. 6/18 Daily weights down today at 101.6 kg, trend with tomorrow's weights. Filed Weights   06/08/22 0441 06/09/22 0500 06/10/22 0430  Weight: 102.5 kg  102.2 kg 101.7 kg    9: C1 right transverse process fracture--he's out of collar 10: Left Occipital condyle fracture--out of collar 11: Acute ventilator respiratory failure requiring trach and PEG 3/29              --decannulated , stoma closed             --robitussin can be prn  -discontinued robinul via PEG      12: Right ear laceration:  healing 14: Tongue laceration: monitor healing 15: Hypertension: lopressor 50mg  bid      -hr controlled on propranolol 30mg  tid  6/16- BP controlled- con't regimen 6/17 BP controlled, intermittent tachycardia (100-110), may be due to agitation vs increased responsiveness to environmental stimuli. 6/18 SBP appears more settled today, mostly in the 120's. Suspect that elevated BP seen within past few days could be related to agitation, which has now improved. 6/20 BP appears controlled, follow    06/10/2022    4:30 AM 06/10/2022    4:26 AM 06/09/2022    9:00 PM  Vitals with BMI  Weight 224 lbs 3 oz    BMI 26.58    Systolic  125 146  Diastolic  85 96  Pulse  92 88    16. Hypoglycemic to 69: otherwise in 90s: will d/c ISS since not requiring   18. Iron deficiency:  conitnue daily iron supplement  19. Tremor,?clonus: continue trial of propranolol 10mg  tid  Increased propranolol to 30 mg tid     -see above 6/17 Continue to monitor for tremor, per spouse he had a very slight tremor or very brief duration with the past two days. 6/18 Continue propranolol 30 mg tid for tremor. 20, Constipation: ?mild  -given pattern I see, I'm not inclined to do more than increase his senna-s. Will dc colace 6/18 LBM 06/05/22, use prns if  needed. 6/20 BM today  LOS: 33 days A FACE TO FACE EVALUATION WAS PERFORMED  Fanny Dance 06/10/2022, 8:07 AM

## 2022-06-10 NOTE — Progress Notes (Signed)
Speech Language Pathology TBI Note  Patient Details  Name: Carl Carpenter MRN: [id number] Date of Birth: 1986-06-12  Today's Date: 06/10/2022 SLP Individual Time: 1415-1500 SLP Individual Time Calculation (min): 45 min  Short Term Goals: Week 5: SLP Short Term Goal 1 (Week 5): Pt will consume therapeutic trials of 1/2 teaspoons of thin liquids and ice chips with minimal overt s/s of aspiration over 3 consecutive sessions and max assist use of swallowing precautions prior to MBS or FEES. SLP Short Term Goal 2 (Week 5): Pt will verbalize on command in 25% of opportunities with max assist multimodal when given chocies from a field of 2. SLP Short Term Goal 3 (Week 5): Pt will answer basic yes/no questions using via gestures or verbalizations in 50% of opportunities with max assist multimodal cues over 2 sessions. SLP Short Term Goal 4 (Week 5): Pt will demonstre sustained attention on a targeted stimulus for 2 minutes with max assist multimodal cues over 2 sessions. SLP Short Term Goal 5 (Week 5): Pt will follow 1 step  commands in 75% of opportunities with max assist multimodal cues over 2 sessions. SLP Short Term Goal 6 (Week 5): Patient will initiate functional tasks with Mod A multimodal cues.  Skilled Therapeutic Interventions: Skilled treatment session focused on dysphagia and cognitive goals. Upon arrival, patient was awake in bed. SLP facilitated session by providing proper positioning for PO intake. Max verbal and tactile cues were needed for initiation with oral care and washing his face. Patient consumed medium to large ice chips with Max verbal cues needed for oral acceptance, oral manipulation and swallow initiation resulting in consistent overt coughing episodes and wet vocal quality. Recommend patient remain NPO. Patient with decreased focused attention with perseveration on pulling or reaching for SLP with his LUE throughout entirety of the session. No verbalizations noted despite Max A  multimodal cues.  Overall, patient with decreased attention, initiation and overall engagement this week compared to last week. Physician aware. Patient handed off to PT. Continue with current plan of care.      Pain Pain Assessment Pain Scale: Faces Pain Score: 0-No pain  Agitated Behavior Scale: TBI Observation Details Observation Environment: CIR Start of observation period - Date: 06/10/22 Start of observation period - Time: 1415 End of observation period - Date: 06/10/22 End of observation period - Time: 1500 Agitated Behavior Scale (DO NOT LEAVE BLANKS) Short attention span, easy distractibility, inability to concentrate: Present to a moderate degree Impulsive, impatient, low tolerance for pain or frustration: Absent Uncooperative, resistant to care, demanding: Absent Violent and/or threatening violence toward people or property: Absent Explosive and/or unpredictable anger: Absent Rocking, rubbing, moaning, or other self-stimulating behavior: Present to a slight degree Pulling at tubes, restraints, etc.: Present to an extreme degree Wandering from treatment areas: Absent Restlessness, pacing, excessive movement: Present to a slight degree Repetitive behaviors, motor, and/or verbal: Present to an extreme degree Rapid, loud, or excessive talking: Absent Sudden changes of mood: Absent Easily initiated or excessive crying and/or laughter: Absent Self-abusiveness, physical and/or verbal: Absent Agitated behavior scale total score: 24  Therapy/Group: Individual Therapy  Edynn Gillock 06/10/2022, 4:02 PM

## 2022-06-11 ENCOUNTER — Inpatient Hospital Stay (HOSPITAL_COMMUNITY): Payer: Medicaid Other

## 2022-06-11 DIAGNOSIS — G249 Dystonia, unspecified: Secondary | ICD-10-CM

## 2022-06-11 DIAGNOSIS — E7220 Disorder of urea cycle metabolism, unspecified: Secondary | ICD-10-CM

## 2022-06-11 LAB — CBC WITH DIFFERENTIAL/PLATELET
Abs Immature Granulocytes: 0.02 10*3/uL (ref 0.00–0.07)
Basophils Absolute: 0.1 10*3/uL (ref 0.0–0.1)
Basophils Relative: 1 %
Eosinophils Absolute: 0.2 10*3/uL (ref 0.0–0.5)
Eosinophils Relative: 3 %
HCT: 42.1 % (ref 39.0–52.0)
Hemoglobin: 14.7 g/dL (ref 13.0–17.0)
Immature Granulocytes: 0 %
Lymphocytes Relative: 29 %
Lymphs Abs: 2.5 10*3/uL (ref 0.7–4.0)
MCH: 31.1 pg (ref 26.0–34.0)
MCHC: 34.9 g/dL (ref 30.0–36.0)
MCV: 89.2 fL (ref 80.0–100.0)
Monocytes Absolute: 0.7 10*3/uL (ref 0.1–1.0)
Monocytes Relative: 8 %
Neutro Abs: 5.1 10*3/uL (ref 1.7–7.7)
Neutrophils Relative %: 59 %
Platelets: 205 10*3/uL (ref 150–400)
RBC: 4.72 MIL/uL (ref 4.22–5.81)
RDW: 11.8 % (ref 11.5–15.5)
WBC: 8.7 10*3/uL (ref 4.0–10.5)
nRBC: 0 % (ref 0.0–0.2)

## 2022-06-11 LAB — VALPROIC ACID LEVEL: Valproic Acid Lvl: 12 ug/mL — ABNORMAL LOW (ref 50.0–100.0)

## 2022-06-11 LAB — URINALYSIS, ROUTINE W REFLEX MICROSCOPIC
Bilirubin Urine: NEGATIVE
Glucose, UA: NEGATIVE mg/dL
Hgb urine dipstick: NEGATIVE
Ketones, ur: NEGATIVE mg/dL
Leukocytes,Ua: NEGATIVE
Nitrite: NEGATIVE
Protein, ur: NEGATIVE mg/dL
Specific Gravity, Urine: 1.021 (ref 1.005–1.030)
pH: 6 (ref 5.0–8.0)

## 2022-06-11 LAB — BASIC METABOLIC PANEL
Anion gap: 11 (ref 5–15)
BUN: 16 mg/dL (ref 6–20)
CO2: 25 mmol/L (ref 22–32)
Calcium: 9.5 mg/dL (ref 8.9–10.3)
Chloride: 102 mmol/L (ref 98–111)
Creatinine, Ser: 0.65 mg/dL (ref 0.61–1.24)
GFR, Estimated: >60 mL/min (ref >60)
Glucose, Bld: 98 mg/dL (ref 70–99)
Potassium: 4.1 mmol/L (ref 3.5–5.1)
Sodium: 138 mmol/L (ref 135–145)

## 2022-06-11 LAB — GLUCOSE, CAPILLARY
Glucose-Capillary: 147 mg/dL — ABNORMAL HIGH (ref 70–99)
Glucose-Capillary: 94 mg/dL (ref 70–99)
Glucose-Capillary: 99 mg/dL (ref 70–99)

## 2022-06-11 LAB — AMMONIA: Ammonia: 49 umol/L — ABNORMAL HIGH (ref 9–35)

## 2022-06-11 MED ORDER — LACTULOSE 10 GM/15ML PO SOLN
30.0000 g | Freq: Once | ORAL | Status: AC
  Administered 2022-06-11: 30 g via ORAL
  Filled 2022-06-11: qty 45

## 2022-06-11 MED ORDER — CARBIDOPA-LEVODOPA 10-100 MG PO TABS
1.0000 | ORAL_TABLET | Freq: Three times a day (TID) | ORAL | Status: DC
  Administered 2022-06-11 – 2022-06-14 (×9): 1 via ORAL
  Filled 2022-06-11 (×11): qty 1

## 2022-06-11 NOTE — Progress Notes (Signed)
Patient ID: Carl Carpenter, male   DOB: [birth year], 36 y.o.   MRN: [id number]  SW left message for [facility] supervisor for [facility] with [facility] Medicaid [phone]) to discuss if PCS referral was received.   SW explored TBI waiver program. Pt Medicaid approved for [facility], [facility]. SW spoke with Marilyn/[employer] [employer] rep who reported that TBI waiver program does not begin with [employer] until October 2023 and TBI waiver continues to be managed by [employer] ([phone]- [phone]). If patient Medicaid transitions to [facility], [facility] [phone]). SW called [employer]. TBI waiver does not service county, serviced counties include: [place], [place], [place], [place], [place], and [place].  SW spoke with [facility] Medicaid who stated no referral, and will need to have completed a 3051 form. States she will send an alert to appropriate staff for them to refax form. *SW received fax for 3051 form. Form needs to be completed within 3-5 days. SW waiting on physician signature.   *SW shared with medical team barriers to get needed services in area.   Potential HHAs: [facility]- doe snot have SN or SLP in area [facility]- no SLP in area  Declined HHAs: [facility]- no services available in area [facility]- doe snot accept insurance [facility]- not contracted with Medicaid [facility]- does not service [facility]- at capacity for [facility] referrals [facility]- not contract with medicaid  Cecile Sheerer, MSW, LCSWA Office: [phone] Cell: [phone] Fax: [phone]

## 2022-06-11 NOTE — Progress Notes (Signed)
Physical Therapy Weekly Progress Note  Patient Details  Name: Carl Carpenter MRN: [id number] Date of Birth: 1986/04/04  Beginning of progress report period: June 04, 2022 End of progress report period: June 11, 2022  Today's Date: 06/11/2022 PT Individual Time: 1030-1100 and 9147-8295 PT Individual Time Calculation (min): 30 min and 74 mins  Patient has met 0 of 3 short term goals.  Pt has made minimal progress toward short term mobility goals during this reporting session/ Pt has appeared less engaged with therapy and with poorer attention, requiring increased assistance with all mobility. Pt has consistently required maxA +2 to totalA +2 for all mobility. Pt's family has attended several family education sessions, and will continue to benefit from skilled family ed prior to pt's discharge.  Patient continues to demonstrate the following deficits muscle weakness, decreased cardiorespiratoy endurance, impaired timing and sequencing, abnormal tone, unbalanced muscle activation, motor apraxia, decreased coordination, and decreased motor planning, decreased attention to right, decreased initiation, decreased attention, decreased awareness, decreased problem solving, decreased safety awareness, decreased memory, delayed processing, and demonstrates behaviors consistent with Rancho Level IV, and decreased sitting balance, decreased standing balance, decreased postural control, hemiplegia, and decreased balance strategies and therefore will continue to benefit from skilled PT intervention to increase functional independence with mobility.  Patient progressing toward long term goals..  Continue plan of care.  PT Short Term Goals Week 5:  PT Short Term Goal 1 (Week 5): Pt will complete bed mobility with modA +1. PT Short Term Goal 1 - Progress (Week 5): Progressing toward goal PT Short Term Goal 2 (Week 5): Pt will complete bed to chair transfer with modA +1. PT Short Term Goal 2 - Progress (Week 5):  Progressing toward goal PT Short Term Goal 3 (Week 5): Pt will ambulate x100' with modA +2 and LRAD. PT Short Term Goal 3 - Progress (Week 5): Progressing toward goal Week 6:  PT Short Term Goal 1 (Week 6): Pt will complete bed mobility with modA +1. PT Short Term Goal 2 (Week 6): Pt will complete bed to chair transfer with modA +1. PT Short Term Goal 3 (Week 6): Pt will ambulate 100' with modA +2 and LRAD.  Skilled Therapeutic Interventions/Progress Updates:     1st session: Pt received supine in bed and in no apparent pain or distress. Supine to sit and stand pivot transfer to [facility] with maxA +2 and multimodal cues for sequencing and hand placement. WC transport to gym for time management. Pt performs sit to stand at stairs with max A +2. Pt holds onto bilateral hand rails and completes x3 steps ascending with reciprocal stepping and maxA +3. Multimodal cues for sequencing, posture, lateral weight shifting, and progression of each foot. Pt descends steps backward, requiring totalA +3 due to L lower extremity lacking initiation/motor planning. Pt guided back to [facility] with totalA +3. Left seated with alarm intact and all needs within reach.  2nd Session: Pt received supine in bed and in no apparent pain or distress. PT has discussion with pt's father regarding need for caregiver self-care, including taking time to rest. Pt perform supine to sit and stand pivot transfer to [facility] with maxA +2 and cues for sequencing and positioning. Pt noted to have soiled brief with urine so transferred back to bed and returns to supine with maxA +2. Pt performs bilateral rolling with maxA +2 to assist with brief change. Once clean brief is donned, pt begins to have bowel movement and again is incontinent of urine.  Pt performs bilateral rolling with maxA +2 for 2nd brief change and to don clean lower body clothing. MaxA +2 for return to seated and stand pivot transfer to WC. Pt performs gait training, ambulating x10' with heavy  maxA +2 with 3 musketeers technique and multimodal cueing for posture and step sequencing. Pt providing minimal initiation on R or L side so gait training terminated after 10'. Pt returned to supine with maxA +2. Left with alarm intact and all needs within reach.  Therapy Documentation Precautions:  Precautions Precautions: Fall Precaution Comments: PEG with binder Restrictions Weight Bearing Restrictions: No  Therapy/Group: Individual Therapy  Breck Coons, PT, DPT 06/11/2022, 5:20 PM
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
## 2022-06-11 NOTE — Progress Notes (Cosign Needed)
EEG complete - results pending 

## 2022-06-11 NOTE — Progress Notes (Signed)
PROGRESS NOTE   Subjective/Complaints: Pt with less initiation with therapy. Not tracking as much the right as he did in the past. Increased extensor tone limiting his activities.Not noted to be speaking as much this week.      ROS: Limited to cognitive/behavioral  Objective:   No results found. Recent Labs    06/11/22 1213  WBC 8.7  HGB 14.7  HCT 42.1  PLT 205        Recent Labs    06/11/22 0555  NA 138  K 4.1  CL 102  CO2 25  GLUCOSE 98  BUN 16  CREATININE 0.65  CALCIUM 9.5         Intake/Output Summary (Last 24 hours) at 06/11/2022 1253 Last data filed at 06/10/2022 2221 Gross per 24 hour  Intake --  Output 400 ml  Net -400 ml              Physical Exam: Vital Signs Blood pressure 120/77, pulse 79, temperature 98.1 F (36.7 C), resp. rate 16, height 6\' 5"  (1.956 m), weight 100.9 kg, SpO2 95 %.  Constitutional: Not in distress.  Vital signs reviewed. BMI 26.80, somnolent HEENT: NCAT, EOMI, moist oral membranes Neck: supple Cardiovascular: regular rhythm, periods of intermittent tachycardia,  without murmur, No JVD. Respiratory/Chest: CTA bilaterally, without wheezes or rales.  Normal effort. GI/Abdomen: BS +4, all quadrants, non-tender, non-distended. Ext: No clubbing, cyanosis, or edema Psych: less  restlessness noted Musculoskeletal: No pain with ROM Skin:  Left knee skin abrasion with scar appears to focus on one object with frequent, repetitive attempts to grasp objects.  Can speak a few words on occasion, per spouse and mother-less in last few days. Did not observe during my exam. Neurological:  DTR's brisk on the right.  Mild flexor tone in right hand.  Moving right side spontaneously. Increased extensor tone UE and LE noted today more on the left  Assessment/Plan: 1. Functional deficits which require 3+ hours per day of interdisciplinary therapy in a comprehensive inpatient  rehab setting. Physiatrist is providing close team supervision and 24 hour management of active medical problems listed below. Physiatrist and rehab team continue to assess barriers to discharge/monitor patient progress toward functional and medical goals  Care Tool:  Bathing    Body parts bathed by patient: Chest, Face, Front perineal area   Body parts bathed by helper: Right arm, Left arm, Abdomen, Right upper leg, Left upper leg, Buttocks, Left lower leg, Right lower leg     Bathing assist Assist Level: Total Assistance - Patient < 25%     Upper Body Dressing/Undressing Upper body dressing   What is the patient wearing?: Pull over shirt    Upper body assist Assist Level: Total Assistance - Patient < 25%    Lower Body Dressing/Undressing Lower body dressing      What is the patient wearing?: Pants, Underwear/pull up     Lower body assist Assist for lower body dressing: 2 Helpers (MAX A +2 bed level)     Toileting Toileting    Toileting assist Assist for toileting: 2 Helpers     Transfers Chair/bed transfer  Transfers assist  Chair/bed transfer activity did not  occur: Safety/medical concerns  Chair/bed transfer assist level: Dependent - mechanical lift (stedy)     Locomotion Ambulation   Ambulation assist   Ambulation activity did not occur: Safety/medical concerns          Walk 10 feet activity   Assist  Walk 10 feet activity did not occur: Safety/medical concerns        Walk 50 feet activity   Assist Walk 50 feet with 2 turns activity did not occur: Safety/medical concerns         Walk 150 feet activity   Assist Walk 150 feet activity did not occur: Safety/medical concerns         Walk 10 feet on uneven surface  activity   Assist Walk 10 feet on uneven surfaces activity did not occur: Safety/medical concerns         Wheelchair     Assist Is the patient using a wheelchair?: Yes Type of Wheelchair: Manual     Wheelchair assist level: Dependent - Patient 0%      Wheelchair 50 feet with 2 turns activity    Assist        Assist Level: Dependent - Patient 0%   Wheelchair 150 feet activity     Assist      Assist Level: Dependent - Patient 0%   Blood pressure 120/77, pulse 79, temperature 98.1 F (36.7 C), resp. rate 16, height 6\' 5"  (1.956 m), weight 100.9 kg, SpO2 95 %.    Medical Problem List and Plan: 1. 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  DVT right posterior tibial and peroneal veins (03/22/22): Discussed with mother that repeat doppler shows no new clots and resolution of one previous clot.  -anticoagulation: continue Eliquis             -antiplatelet therapy: none 3. Pain: continueTylenol prn 4. Behavior/sleep: LCSW to evaluate and provide emotional support             -antipsychotic agents: none  -increasing propranolol for restlessness  -continue low dose seroquel at night for restlessness/agitation  -added valproic acid 250mg  bid 6/15, titrate up as tolerated/needed---max titration would be around 500mg  tid  -if he becomes more agitated this coming week, seroquel dose and frequency could be increased   -Change valproic acid to QHS  6/17 Per nursing and spouse has had some increased agitation at night, unsure if it could be r/t increased responsiveness to stimuli?  Also, has SBP spike to 160 overnight, with intermittent tachycardia (100's). 6/18 Appears overall more calm and less restless today, HR ranging 93-106, but not seeing SBP spikes, with SBP mostly in the 120's. 6/20- Restlessness appears  decreased overall, will monitor 5. Impaired attention/cognition: This patient is not capable of making decisions on his own behalf.             - ritalin  25mg  bid -continue  6/16- adjusted sinemet to 25/100 yesterday   -stopped amantadine      -  there have been notable improvements in initiation since sinemet was initiated this week. Not sure I would tolerate this much further 6/17 appears to pay attention, but also to be fixated on one thing.  For example, was continuously grasping at blanket this morning, later came back and was repeatedly grasping his left upper thigh with his left hand.  6/18 Attention and cognition are improved today, per exam and report by mother at bedside.  Continue ritalin, sinemet, Seroquel, and valproic acid at current dosages. 6/21 Decrease valproic acid to QHS 6/22-noted to have increased extensor tone compared to last week, less initiation, discussed with pharmacy, will try decrease sinemet to 10-100, hold next dose 6. Dysphagia. S/p PEG 3/29.  --NPO -continue TF/boluses -Continue zinc oxide to protect skin surrounding PEG tube -5/30 replaced PEG under fluoro. Appreciate IR help -PEG site remains clean. Bumper has been loose on tube. Just needs to be cinched back down. They can also tape it as well. A rubber band might work better. 6/18 PEG site clean, still issues with bumper. 7. Fluids/Electrolytes/Nutrition: Routine Is and Os and follow-up chemistries             --continue tube feeds Jevity 1.5 boluses             --continue Prosource             - prealbumin 35.8, gaining weight 6/17 Will get pre-albumin on Monday labs. 6/18 Daily weights down today at 101.6 kg, trend with tomorrow's weights. 6/21 today 101.7kg, stable overall continue to follow Filed Weights   06/09/22 0500 06/10/22 0430 06/11/22 0536  Weight: 102.2 kg 101.7 kg 100.9 kg    9: C1 right transverse process fracture--he's out of collar 10: Left Occipital condyle fracture--out of  collar 11: Acute ventilator respiratory failure requiring trach and PEG 3/29              --decannulated , stoma closed             --robitussin can be prn  -discontinued robinul via PEG      12: Right ear laceration:  healing 14: Tongue laceration: monitor healing 15: Hypertension: lopressor 50mg  bid      -hr controlled on propranolol 30mg  tid  6/16- BP controlled- con't regimen 6/17 BP controlled, intermittent tachycardia (100-110), may be due to agitation vs increased responsiveness to environmental stimuli. 6/18 SBP appears more settled today, mostly in the 120's. Suspect that elevated BP seen within past few days could be related to agitation, which has now improved. 6/22 well controlled    06/11/2022    5:36 AM 06/10/2022    8:08 PM 06/10/2022    4:30 AM  Vitals with BMI  Weight 222 lbs 7 oz  224 lbs 3 oz  BMI   26.58  Systolic 120 129   Diastolic 77 82   Pulse 79 95     16. Hypoglycemic to 69: otherwise in 90s: will d/c ISS since not requiring   18. Iron deficiency:  conitnue daily iron supplement  19. Tremor,?clonus: continue trial of propranolol 10mg  tid  Increased propranolol to 30 mg tid     -see above 6/17 Continue to monitor for tremor, per spouse he had a very slight tremor or very brief duration with the past two days. 6/18 Continue propranolol 30 mg tid for tremor. 20, Constipation: ?mild  -given pattern I see, I'm not inclined to do more than increase his senna-s. Will dc colace 6/18 LBM 06/05/22, use prns if needed.  6/21 BM today and yesterday 21. Increased extensor tone -Will order CBC, UA,CXR to r/o infection   LOS: 34 days A FACE TO FACE EVALUATION WAS PERFORMED  Fanny Dance 06/11/2022, 12:53 PM

## 2022-06-11 NOTE — Progress Notes (Signed)
Speech Language Pathology Weekly Progress and Session Note  Patient Details  Name: Kayston Jodoin MRN: [id number] Date of Birth: [birth year]  Beginning of progress report period: June 05, 2022 End of progress report period: June 11, 2022  Today's Date: 06/11/2022 SLP Individual Time: 8144-8185 SLP Individual Time Calculation (min): 45 min  Short Term Goals: Week 5: SLP Short Term Goal 1 (Week 5): Pt will consume therapeutic trials of 1/2 teaspoons of thin liquids and ice chips with minimal overt s/s of aspiration over 3 consecutive sessions and max assist use of swallowing precautions prior to MBS or FEES. SLP Short Term Goal 1 - Progress (Week 5): Not met SLP Short Term Goal 2 (Week 5): Pt will verbalize on command in 25% of opportunities with max assist multimodal when given chocies from a field of 2. SLP Short Term Goal 2 - Progress (Week 5): Not met SLP Short Term Goal 3 (Week 5): Pt will answer basic yes/no questions using via gestures or verbalizations in 50% of opportunities with max assist multimodal cues over 2 sessions. SLP Short Term Goal 3 - Progress (Week 5): Not met SLP Short Term Goal 4 (Week 5): Pt will demonstre sustained attention on a targeted stimulus for 2 minutes with max assist multimodal cues over 2 sessions. SLP Short Term Goal 4 - Progress (Week 5): Not met SLP Short Term Goal 5 (Week 5): Pt will follow 1 step  commands in 75% of opportunities with max assist multimodal cues over 2 sessions. SLP Short Term Goal 5 - Progress (Week 5): Not met SLP Short Term Goal 6 (Week 5): Patient will initiate functional tasks with Mod A multimodal cues. SLP Short Term Goal 6 - Progress (Week 5): Not met    New Short Term Goals: Week 6: SLP Short Term Goal 1 (Week 6): Pt will consume therapeutic trials of 1/2 teaspoons of thin liquids and ice chips with minimal overt s/s of aspiration over 2 consecutive sessions and max assist use of swallowing precautions prior to MBS or  FEES. SLP Short Term Goal 2 (Week 6): Pt will verbalize on command in 25% of opportunities with max assist multimodal when given chocies from a field of 2. SLP Short Term Goal 3 (Week 6): Pt will answer basic yes/no questions using via gestures or verbalizations in 50% of opportunities with max assist multimodal cues over 2 sessions. SLP Short Term Goal 4 (Week 6): Pt will demonstre sustained attention on a targeted stimulus for 2 minutes with max assist multimodal cues over 2 sessions. SLP Short Term Goal 5 (Week 6): Pt will follow 1 step  commands in 75% of opportunities with max assist multimodal cues over 2 sessions. SLP Short Term Goal 6 (Week 6): Patient will initiate functional tasks with Mod A multimodal cues.  Weekly Progress Updates: Patient has made minimal gains and has not met any STGs this reporting period. Currently, patient has had a decline in overall cognitive functioning and presents this week more like a [place] level IIl vs the emerging IV from late last week. Physician made aware. Patient requires overall Max A multimodal cues for initiation and focused attention. Patient unable to verbalize this week despite Max A multimodal cues and demonstrates increased perseveration with LUE, especially with reaching and grabbing at fabric. Patient is consuming trials of medium to large ice chips with increased cues needed for oral acceptance. Patient with decreased oral manipulation and minimal swallow initiation resulting in overt coughing, therefore, patient is not ready for MBS  at this time. Recommend patient remain NPO. Patient and family education ongoing. Patient would benefit from continued skilled SLP intervention to maximize his swallowing and cognitive-linguistic functioning prior to discharge.      Intensity: Minumum of 1-2 x/day, 30 to 90 minutes Frequency: 3 to 5 out of 7 days Duration/Length of Stay: 06/26/22 Treatment/Interventions: Cognitive remediation/compensation;Cueing  hierarchy;Dysphagia/aspiration precaution training;Environmental controls;Internal/external aids;Speech/Language facilitation;Patient/family education;Functional tasks;Multimodal communication approach;Therapeutic Activities   Daily Session  Skilled Therapeutic Interventions:   Skilled treatment session focused on cognitive goals. Upon arrival, patient was awake in bed. SLP facilitated session by providing +3 assist to slideboard to the wheelchair to maximize arousal and positioning for treatment. During transfer, patient unable to follow commands and was consistently attempting to lift his LLE off the floor and push with his LUE. Patient unable to follow commands during functional tasks with minimal initiation. Patient extremely resistive to any movement of his LUE with rigidity noted and unable to remove a toothbrush from his mouth and allowed a wet washcloth to rest on his face without attempts of removal. Patient unable to verbalize despite Max A multimodal cues with anterior spillage of saliva noted. PA and physician made aware of decreased initiation, attention, etc. Patient handed off to PT. Continue with current plan of care.      Pain Pain Assessment Pain Score: Asleep  Therapy/Group: Individual Therapy  Teofila Bowery 06/11/2022, 6:16 AM

## 2022-06-12 DIAGNOSIS — R4182 Altered mental status, unspecified: Secondary | ICD-10-CM

## 2022-06-12 DIAGNOSIS — R7989 Other specified abnormal findings of blood chemistry: Secondary | ICD-10-CM

## 2022-06-12 LAB — HEPATIC FUNCTION PANEL
ALT: 21 U/L (ref 0–44)
AST: 16 U/L (ref 15–41)
Albumin: 4 g/dL (ref 3.5–5.0)
Alkaline Phosphatase: 81 U/L (ref 38–126)
Bilirubin, Direct: <0.1 mg/dL (ref 0.0–0.2)
Total Bilirubin: 0.6 mg/dL (ref 0.3–1.2)
Total Protein: 7.2 g/dL (ref 6.5–8.1)

## 2022-06-12 LAB — GLUCOSE, CAPILLARY
Glucose-Capillary: 110 mg/dL — ABNORMAL HIGH (ref 70–99)
Glucose-Capillary: 113 mg/dL — ABNORMAL HIGH (ref 70–99)
Glucose-Capillary: 119 mg/dL — ABNORMAL HIGH (ref 70–99)
Glucose-Capillary: 121 mg/dL — ABNORMAL HIGH (ref 70–99)
Glucose-Capillary: 89 mg/dL (ref 70–99)
Glucose-Capillary: 91 mg/dL (ref 70–99)

## 2022-06-12 LAB — URINE CULTURE: Culture: NO GROWTH

## 2022-06-12 NOTE — Procedures (Signed)
[name]  MRN: [id number]  Epilepsy Attending: Charlsie Quest  Referring Physician/Provider: Marjorie Smolder, NP  Date: 06/11/2022 Duration: 21.25 mins  Patient history: 36 year old male hx of TBI and resultant R sided weakness noted to have worsening mental status.  EEG to evaluate for seizure.  Level of alertness: Awake, asleep  AEDs during EEG study: VPA  Technical aspects: This EEG study was done with scalp electrodes positioned according to the 10-20 International system of electrode placement. Electrical activity was acquired at a sampling rate of 500Hz  and reviewed with a high frequency filter of 70Hz  and a low frequency filter of 1Hz . EEG data were recorded continuously and digitally stored.   Description: The posterior dominant rhythm consists of 9-10 Hz activity of moderate voltage (25-35 uV) seen predominantly in posterior head regions, symmetric and reactive to eye opening and eye closing. Sleep was characterized by vertex waves, sleep spindles (12 to 14 Hz), maximal frontocentral region. EEG showed intermittent generalized 3 to 6 Hz theta-delta slowing. Hyperventilation and photic stimulation were not performed.     ABNORMALITY - Intermittent slow, generalized  IMPRESSION: This study is suggestive of mild diffuse encephalopathy, nonspecific etiology. No seizures or epileptiform discharges were seen throughout the recording.  Carl Carpenter Annabelle Harman

## 2022-06-12 NOTE — Progress Notes (Signed)
Patient ID: Carl Carpenter, male   DOB: [birth year], 36 y.o.   MRN: [id number]  SW faxed PCS referral form to [facility] (p:[phone]/f:[phone]). *SW left message to confirm if referral has been received.   Cecile Sheerer, MSW, LCSWA Office: [phone] Cell: [phone] Fax: [phone]

## 2022-06-12 NOTE — Plan of Care (Signed)
Neurology plan of care  Please see consult note from Dr. Derry Lory overnight. EEG showed no epileptiform abnl. Primary team plans to transition off VPA 2/2 elevated ammonia. No further neurologic workup indicated at this time. Neurology to sign off, but please -re-engage if additional neurologic concerns arise.  Bing Neighbors, MD [facility] [phone]  If 7pm- 7am, please page neurology on call as listed in AMION.

## 2022-06-13 ENCOUNTER — Inpatient Hospital Stay (HOSPITAL_COMMUNITY): Payer: Medicaid Other

## 2022-06-13 DIAGNOSIS — S062X0S Diffuse traumatic brain injury without loss of consciousness, sequela: Secondary | ICD-10-CM

## 2022-06-13 DIAGNOSIS — R451 Restlessness and agitation: Secondary | ICD-10-CM

## 2022-06-13 DIAGNOSIS — Z931 Gastrostomy status: Secondary | ICD-10-CM

## 2022-06-13 DIAGNOSIS — R41840 Attention and concentration deficit: Secondary | ICD-10-CM

## 2022-06-13 DIAGNOSIS — S069XAD Unspecified intracranial injury with loss of consciousness status unknown, subsequent encounter: Secondary | ICD-10-CM

## 2022-06-13 LAB — BASIC METABOLIC PANEL
Anion gap: 11 (ref 5–15)
BUN: 15 mg/dL (ref 6–20)
CO2: 24 mmol/L (ref 22–32)
Calcium: 9.9 mg/dL (ref 8.9–10.3)
Chloride: 102 mmol/L (ref 98–111)
Creatinine, Ser: 0.62 mg/dL (ref 0.61–1.24)
GFR, Estimated: >60 mL/min (ref >60)
Glucose, Bld: 101 mg/dL — ABNORMAL HIGH (ref 70–99)
Potassium: 3.9 mmol/L (ref 3.5–5.1)
Sodium: 137 mmol/L (ref 135–145)

## 2022-06-13 LAB — GLUCOSE, CAPILLARY
Glucose-Capillary: 101 mg/dL — ABNORMAL HIGH (ref 70–99)
Glucose-Capillary: 103 mg/dL — ABNORMAL HIGH (ref 70–99)
Glucose-Capillary: 120 mg/dL — ABNORMAL HIGH (ref 70–99)
Glucose-Capillary: 124 mg/dL — ABNORMAL HIGH (ref 70–99)
Glucose-Capillary: 124 mg/dL — ABNORMAL HIGH (ref 70–99)
Glucose-Capillary: 98 mg/dL (ref 70–99)

## 2022-06-13 LAB — AMMONIA: Ammonia: 39 umol/L — ABNORMAL HIGH (ref 9–35)

## 2022-06-13 MED ORDER — LACTULOSE 10 GM/15ML PO SOLN
20.0000 g | Freq: Every day | ORAL | Status: AC
  Administered 2022-06-13 – 2022-06-14 (×2): 20 g via ORAL
  Filled 2022-06-13 (×2): qty 30

## 2022-06-13 NOTE — Progress Notes (Signed)
Occupational Therapy TBI Note  Patient Details  Name: Fintan Dodson MRN: [id number] Date of Birth: [birth year]  Today's Date: 06/13/2022 OT Individual Time: 1002-1059 OT Individual Time Calculation (min): 57 min    Short Term Goals: Week 5:  OT Short Term Goal 1 (Week 5): Pt's caregiver will demonstrate competency in bed level bathing and dressing OT Short Term Goal 2 (Week 5): Pt's caregivers will demonstrate competency and safety in ADL transfers using LRAD OT Short Term Goal 3 (Week 5): Pt will complete stedy transfer with no more than mod A, +2 for safety not assist  Skilled Therapeutic Interventions/Progress Updates:    Pt in bed to start session with significant other present.  Pt with left gaze preference and head turn holding tightly to her hand with increased elbow extensor pattern.  Therapist had to physically take his head and turn it to the right for pt to look right of midline.  Then he would only maintain for a few seconds.  Pt with moderate flexor tone in the right elbow, chest and hand as well when therapist would try to stretch it out.  Worked on donning new brief in supine rolling with total assist +2 (pt 30%) for rolling side to side. Pt able to then transition to sitting EOB with total assist +2 (pt 10%).  Total assist for sitting balance while working on doffing dirty shirt, washing his back, and donning a clean pullover shirt.  Pt did not actively assist for any of the activities.  Head turned to the left most of the time, with his significant other in front of him to help facilitate midline eyes and head orientation.  For LB dressing, he did lift his LLE slightly to assist with placing shorts, sock and shoe over it when it was presented but did not complete any further assist.  Total assist throughout for sitting balance.  Total +2 (pt 20%) for sit to stand and standing to pull items over his hips.  Increased posterior bias with flexed trunk in standing.  He was unable to  obtain upright standing posture and could not take steps for transfer to the tilt in space wheelchair.  Completed squat pivot transfer to the wheelchair at total assist.  Total hand over hand assist for initiation and completion of washing face.  Pt resistant to therapist guidance with the more active LUE as well as the RUE.  Finished session with pt sitting reclined in the wheelchair and with the call button and phone in reach.     Therapy Documentation Precautions:  Precautions Precautions: Fall Precaution Booklet Issued: No Precaution Comments: PEG with binder Restrictions Weight Bearing Restrictions: No   Pain: Pain Assessment Pain Scale: Faces Pain Score: 0-No pain Faces Pain Scale: No hurt Agitated Behavior Scale: TBI Observation Details Observation Environment: pt room Start of observation period - Date: 06/13/22 Start of observation period - Time: 1005 End of observation period - Date: 06/13/22 End of observation period - Time: 1100 Agitated Behavior Scale (DO NOT LEAVE BLANKS) Short attention span, easy distractibility, inability to concentrate: Present to a moderate degree Impulsive, impatient, low tolerance for pain or frustration: Present to a moderate degree Uncooperative, resistant to care, demanding: Present to a moderate degree Violent and/or threatening violence toward people or property: Absent Explosive and/or unpredictable anger: Absent Rocking, rubbing, moaning, or other self-stimulating behavior: Absent Pulling at tubes, restraints, etc.: Present to a slight degree Wandering from treatment areas: Absent Restlessness, pacing, excessive movement: Absent Repetitive behaviors, motor, and/or  verbal: Present to a moderate degree Rapid, loud, or excessive talking: Absent Sudden changes of mood: Absent Easily initiated or excessive crying and/or laughter: Absent Self-abusiveness, physical and/or verbal: Absent Agitated behavior scale total score: 23  ADL: See  Care Tool Section for some details of mobility of selfcare   Therapy/Group: Individual Therapy  Petronella Shuford OTR/L 06/13/2022, 1:00 PM

## 2022-06-14 ENCOUNTER — Inpatient Hospital Stay (HOSPITAL_COMMUNITY): Payer: Medicaid Other

## 2022-06-14 LAB — URINALYSIS, ROUTINE W REFLEX MICROSCOPIC
Bilirubin Urine: NEGATIVE
Glucose, UA: NEGATIVE mg/dL
Hgb urine dipstick: NEGATIVE
Ketones, ur: NEGATIVE mg/dL
Leukocytes,Ua: NEGATIVE
Nitrite: NEGATIVE
Protein, ur: NEGATIVE mg/dL
Specific Gravity, Urine: 1.017 (ref 1.005–1.030)
pH: 7 (ref 5.0–8.0)

## 2022-06-14 LAB — CBC
HCT: 43.6 % (ref 39.0–52.0)
Hemoglobin: 15.6 g/dL (ref 13.0–17.0)
MCH: 32 pg (ref 26.0–34.0)
MCHC: 35.8 g/dL (ref 30.0–36.0)
MCV: 89.3 fL (ref 80.0–100.0)
Platelets: 226 10*3/uL (ref 150–400)
RBC: 4.88 MIL/uL (ref 4.22–5.81)
RDW: 11.7 % (ref 11.5–15.5)
WBC: 9.3 10*3/uL (ref 4.0–10.5)
nRBC: 0 % (ref 0.0–0.2)

## 2022-06-14 LAB — GLUCOSE, CAPILLARY
Glucose-Capillary: 100 mg/dL — ABNORMAL HIGH (ref 70–99)
Glucose-Capillary: 104 mg/dL — ABNORMAL HIGH (ref 70–99)
Glucose-Capillary: 117 mg/dL — ABNORMAL HIGH (ref 70–99)
Glucose-Capillary: 144 mg/dL — ABNORMAL HIGH (ref 70–99)
Glucose-Capillary: 90 mg/dL (ref 70–99)
Glucose-Capillary: 93 mg/dL (ref 70–99)

## 2022-06-14 MED ORDER — PROPRANOLOL HCL 20 MG/5ML PO SOLN
40.0000 mg | Freq: Four times a day (QID) | ORAL | Status: DC
Start: 2022-06-14 — End: 2022-06-29
  Administered 2022-06-14 – 2022-06-29 (×59): 40 mg
  Filled 2022-06-14 (×63): qty 10

## 2022-06-14 NOTE — Progress Notes (Addendum)
This nurse notified by CNA of high heart rate at rest on assessment patient heart rate regular but tachycardic to 118 via manual pulse collection. Patient has consistent cough x 2 days Md notified and received new orders for UA+CS chest x-ray and CBC all returned negative with exception of UA with cloudy appearance and rare bacteria urine culture still pending. Patient tachycardic to the mid 130's MD again notified of high heart rate and EKG ordered with new orders  initiated. Patient in bed resting with eyes open family at bedside appreciative of care. Mews of 2 based on afternoon vital signs check. Mews yelow protocol initated and patient remains a yellow mews at 1437 vital sign check.

## 2022-06-15 DIAGNOSIS — R4689 Other symptoms and signs involving appearance and behavior: Secondary | ICD-10-CM

## 2022-06-15 DIAGNOSIS — I10 Essential (primary) hypertension: Secondary | ICD-10-CM

## 2022-06-15 LAB — CBC
HCT: 43.4 % (ref 39.0–52.0)
Hemoglobin: 15.5 g/dL (ref 13.0–17.0)
MCH: 32 pg (ref 26.0–34.0)
MCHC: 35.7 g/dL (ref 30.0–36.0)
MCV: 89.7 fL (ref 80.0–100.0)
Platelets: 196 10*3/uL (ref 150–400)
RBC: 4.84 MIL/uL (ref 4.22–5.81)
RDW: 11.8 % (ref 11.5–15.5)
WBC: 7.8 10*3/uL (ref 4.0–10.5)
nRBC: 0 % (ref 0.0–0.2)

## 2022-06-15 LAB — URINE CULTURE
Culture: <10000 — AB
Special Requests: NORMAL

## 2022-06-15 LAB — GLUCOSE, CAPILLARY
Glucose-Capillary: 106 mg/dL — ABNORMAL HIGH (ref 70–99)
Glucose-Capillary: 109 mg/dL — ABNORMAL HIGH (ref 70–99)
Glucose-Capillary: 110 mg/dL — ABNORMAL HIGH (ref 70–99)
Glucose-Capillary: 119 mg/dL — ABNORMAL HIGH (ref 70–99)
Glucose-Capillary: 123 mg/dL — ABNORMAL HIGH (ref 70–99)
Glucose-Capillary: 132 mg/dL — ABNORMAL HIGH (ref 70–99)

## 2022-06-15 LAB — AMMONIA: Ammonia: 34 umol/L (ref 9–35)

## 2022-06-15 MED ORDER — METHYLPHENIDATE HCL 5 MG PO TABS
10.0000 mg | ORAL_TABLET | Freq: Two times a day (BID) | ORAL | Status: DC
  Administered 2022-06-15 – 2022-06-17 (×4): 10 mg
  Filled 2022-06-15 (×4): qty 2

## 2022-06-16 LAB — GLUCOSE, CAPILLARY
Glucose-Capillary: 104 mg/dL — ABNORMAL HIGH (ref 70–99)
Glucose-Capillary: 104 mg/dL — ABNORMAL HIGH (ref 70–99)
Glucose-Capillary: 107 mg/dL — ABNORMAL HIGH (ref 70–99)
Glucose-Capillary: 123 mg/dL — ABNORMAL HIGH (ref 70–99)
Glucose-Capillary: 129 mg/dL — ABNORMAL HIGH (ref 70–99)
Glucose-Capillary: 133 mg/dL — ABNORMAL HIGH (ref 70–99)
Glucose-Capillary: 96 mg/dL (ref 70–99)

## 2022-06-16 MED ORDER — JEVITY 1.5 CAL/FIBER PO LIQD
474.0000 mL | Freq: Four times a day (QID) | ORAL | Status: DC
  Administered 2022-06-16 – 2022-06-29 (×54): 474 mL
  Filled 2022-06-16 (×2): qty 474
  Filled 2022-06-16: qty 1000
  Filled 2022-06-16: qty 474
  Filled 2022-06-16 (×2): qty 1000
  Filled 2022-06-16 (×4): qty 474
  Filled 2022-06-16 (×3): qty 1000
  Filled 2022-06-16: qty 474
  Filled 2022-06-16: qty 1000
  Filled 2022-06-16 (×4): qty 474
  Filled 2022-06-16: qty 1000
  Filled 2022-06-16 (×6): qty 474
  Filled 2022-06-16: qty 1000
  Filled 2022-06-16: qty 474
  Filled 2022-06-16: qty 1000
  Filled 2022-06-16 (×2): qty 474
  Filled 2022-06-16: qty 1000
  Filled 2022-06-16: qty 474
  Filled 2022-06-16: qty 1000
  Filled 2022-06-16: qty 474
  Filled 2022-06-16: qty 1000
  Filled 2022-06-16 (×3): qty 474
  Filled 2022-06-16 (×3): qty 1000
  Filled 2022-06-16 (×6): qty 474
  Filled 2022-06-16: qty 1000
  Filled 2022-06-16: qty 474
  Filled 2022-06-16 (×2): qty 1000
  Filled 2022-06-16 (×2): qty 474
  Filled 2022-06-16: qty 1000
  Filled 2022-06-16 (×4): qty 474
  Filled 2022-06-16 (×2): qty 1000
  Filled 2022-06-16 (×2): qty 474

## 2022-06-16 MED ORDER — GUAIFENESIN-DM 100-10 MG/5ML PO SYRP
15.0000 mL | ORAL_SOLUTION | Freq: Four times a day (QID) | ORAL | Status: DC
  Administered 2022-06-16 – 2022-06-30 (×55): 15 mL
  Filled 2022-06-16 (×55): qty 20

## 2022-06-16 MED ORDER — GLYCOPYRROLATE 1 MG PO TABS
1.0000 mg | ORAL_TABLET | Freq: Three times a day (TID) | ORAL | Status: DC
  Administered 2022-06-16 – 2022-06-29 (×41): 1 mg
  Filled 2022-06-16 (×41): qty 1

## 2022-06-16 NOTE — Progress Notes (Signed)
Patient ID: Carl Carpenter, male   DOB: Feb 13, 1986, 37 y.o.   MRN: [id number]  SW received message from [facility] confirming [facility] referral was received.  SW met with pt partner Carl Carpenter in room to provide updates from team conference, and d/c date now 7/14. SW explained challenges with establishing HH, and pt will not have SLP at discharge.  She inquired about alternate options. SW shared because pt has HH, he cannot have outpatient therapies at the same time. SW discussed SNF placement but informed the facility options will be limited, and they do not have to offer therapies as Medicaid is considered custodial care. She would like to know what speech resources could she use to continue to work with him at home. She confirms she received a phone call about PCS referral. SW informed about pt being placed on CAP/DA, and to expect a packet in the mail. SW encouraged her to follow-up if any questions.   SW waiting on updates from [facility] if able to accept referral for HHPT/OT/SN/aide.  Cecile Sheerer, MSW, LCSWA Office: [phone] Cell: [phone] Fax: [phone]

## 2022-06-16 NOTE — Progress Notes (Signed)
Nutrition Follow-up  DOCUMENTATION CODES:   Non-severe (moderate) malnutrition in context of acute illness/injury  INTERVENTION:   Continue Tube feed via PEG:  Switch back to 2 containers of Jevity 1.5 - QID 45 mL ProSource TF - QID 50 mL free water before and after each feeding Provides: 3000 kcal, 164 grams protein, and 1440 mL free water (1840 mL total free water).   NUTRITION DIAGNOSIS:   Moderate Malnutrition related to acute illness as evidenced by moderate fat depletion, moderate muscle depletion. - Bring addressed via TF  GOAL:   Patient will meet greater than or equal to 90% of their needs - Being met via TF  MONITOR:   TF tolerance, Weight trends, I & O's  REASON FOR ASSESSMENT:   New TF    ASSESSMENT:   Pt with recent hospitalization 3/21 - 5/19 for severe TBI after [facility].  Pt in therapies at time of RD visit.   Discussed case with RN. RN reports pt tolerating TF well. Pt noted to have diarrhea a few days ago. Seems to have improved pt most recent BM was recorded as a Type 6. Pharmacy now has Jevity 1.5 cartons, RD to switch back and assess if addition of fiber bulks stools.   Pt weight has remained stable during admission to CIR.   Discharge date has been pushed back to 7/14 at this time.   Medications reviewed and include: Ferrous Sulfate, Senokot Labs reviewed.  Diet Order:   Diet Order             Diet NPO time specified  Diet effective now                   EDUCATION NEEDS:   Not appropriate for education at this time  Skin:  Skin Assessment: Reviewed RN Assessment  Last BM:  6/26  Height:  Ht Readings from Last 1 Encounters:  05/11/22 6\' 5"  (1.956 m)   Weight:  Wt Readings from Last 1 Encounters:  06/16/22 102.5 kg   BMI:  Body mass index is 26.8 kg/m.  Estimated Nutritional Needs:  Kcal:  2800-3000 Protein:  150-170 grams Fluid:  >2.8 L/day   Kirby Crigler RD, LDN Clinical Dietitian See [facility] for contact  information.

## 2022-06-16 NOTE — Patient Care Conference (Signed)
Inpatient RehabilitationTeam Conference and Plan of Care Update Date: 06/16/2022   Time: 10:40 AM    [name]      Medical Record Number: [id number]  Date of Birth: 16-Jan-1986 Sex: Male         Room/Bed: 4W14C/4W14C-01 Payor Info: Payor: [place] MEDICAID PREPAID HEALTH PLAN / Plan: [place] MEDICAID [employer] / Product Type: *No Product type* /    Admit Date/Time:  05/08/2022  3:31 PM  Primary Diagnosis:  TBI (traumatic brain injury) [facility])  Hospital Problems: Principal Problem:   TBI (traumatic brain injury) (HCC) Active Problems:   Malnutrition of moderate degree    Expected Discharge Date: Expected Discharge Date: 07/03/22  Team Members Present: Physician leading conference: Dr. Faith Rogue Social Worker Present: Cecile Sheerer, LCSWA Nurse Present: Kennyth Arnold, RN PT Present: Malachi Pro, PT OT Present: Kearney Hard, OT SLP Present: Eilene Ghazi, SLP PPS Coordinator present : Edson Snowball, PT     Current Status/Progress Goal Weekly Team Focus  Bowel/Bladder   Pt is incontinent of bowel/bladder  Pt will gain continence of bowel/bladder  Will assess qshift and PRN   Swallow/Nutrition/ Hydration   NPO with PEG, still exhibiting s/sx of aspiration with trials- pt demonstrates oral holding, delayed oral transit, and seemingly incomplete A-P transit, signs of airway invasion  Mod A with least restrictive diet  ongoing trials with focus on oral acceptance, manipulation, and swallow initiation   ADL's   Poor week of participation- very resitant to all care, extensor tone but this is improved this week- better initiation overall. Still slightly tachy at rest and with activity. He will likely need to be hoyer lift at home instead of stedy like we thought initially.  mod-max A overall, caregiver goals  family education, initiation, transfers, self care retraining   Mobility   max to totalA bed mobility, maxA +2 sit to stand, maxA +2 gait x20'   maxA bed level  family ed, activity tolerance, enagement with therapy, ambulation   Communication   max-to-total A. Minimal verbalizations, increased with family members names and personalized photos, max A for gestures, following 1-step commands with max A  Mod A  verbalizing wants/needs at word level, following basic commands   Safety/Cognition/ Behavioral Observations  max-to-total  Mod A      Pain   Pt shows no signs of pain  Pt will continue to show no signs of pain  Will assess qshift and PRN   Skin   Pt's skin is intact  Pt's skin will remain intact  Will assess qshift and PRN     Discharge Planning:  Pt now has [facility] Medicaid. Pt will d/c to home with significant other and will have 24/7 care from her, and PRN support from other family members. Fam mtg held Thursday (6/15) at 10am with family to discuss care needs. Fam edu completed (6/19-6/23). PCS referral recevied. Pt placed on CAP/DA referral list. Working on HHA, however, options limited due to insurance, and/or some services are not offered such as SLP which is a needed service at discharge.   Team Discussion: MD re-started Ritalin. Re-adjusted changed medications. Continues to be tachycardic. Still not doing well managing secretions. Verbalizing more today. Labs abnormal, will continue to monitor. Remains incontinent B/B, no reported pain. MD to add anti-depressant. Family has been educated on tube-feeds and returned successful demonstration to nursing. Will not have access to SLP due to discharge location, not available in the area. Not a good week last week, more  resistant to care and therapy. More engaged and less resistant this week. Will likely need hoyer lift at discharge.   Patient on target to meet rehab goals: Mod/max assist goals. Remains total assist currently.  *See Care Plan and progress notes for long and short-term goals.   Revisions to Treatment Plan:  MD adjusting medications and monitoring labs.   Teaching  Needs: Continue family education, bowel/bladder management, medication management, peg tube management, transfer training, etc.   Current Barriers to Discharge: Insurance for SNF coverage and no SLP coverage  Possible Resolutions to Barriers: Continue to search for SLP coverage      Medical Summary Current Status: pt with tachycardia, decreased responsiveness?, less attentive. not engaging right side as much. more cough?  Barriers to Discharge: Medical stability   Possible Resolutions to Barriers/Weekly Focus: resuming ritalin to improve attention and initiation. rx cough   Continued Need for Acute Rehabilitation Level of Care: The patient requires daily medical management by a physician with specialized training in physical medicine and rehabilitation for the following reasons: Direction of a multidisciplinary physical rehabilitation program to maximize functional independence : Yes Medical management of patient stability for increased activity during participation in an intensive rehabilitation regime.: Yes Analysis of laboratory values and/or radiology reports with any subsequent need for medication adjustment and/or medical intervention. : Yes   I attest that I was present, lead the team conference, and concur with the assessment and plan of the team.   Tennis Must 06/16/2022, 2:53 PM

## 2022-06-17 LAB — GLUCOSE, CAPILLARY
Glucose-Capillary: 104 mg/dL — ABNORMAL HIGH (ref 70–99)
Glucose-Capillary: 100 mg/dL — ABNORMAL HIGH (ref 70–99)
Glucose-Capillary: 129 mg/dL — ABNORMAL HIGH (ref 70–99)
Glucose-Capillary: 99 mg/dL (ref 70–99)
Glucose-Capillary: 127 mg/dL — ABNORMAL HIGH (ref 70–99)

## 2022-06-17 MED ORDER — METHYLPHENIDATE HCL 5 MG PO TABS
15.0000 mg | ORAL_TABLET | Freq: Two times a day (BID) | ORAL | Status: DC
  Administered 2022-06-17 – 2022-06-19 (×4): 15 mg
  Filled 2022-06-17 (×4): qty 3

## 2022-06-17 NOTE — Progress Notes (Signed)
Occupational Therapy Weekly Progress Note  Patient Details  Name: Carl Carpenter MRN: [id number] Date of Birth: 1986-10-02  Beginning of progress report period: June 09, 2022 End of progress report period: June 17, 2022  Today's Date: 06/17/2022 OT Individual Time: 1105-1200 OT Individual Time Calculation (min): 55 min    Patient has met 1 of 3 short term goals.  Carl Carpenter has had a difficult week with changes in medical stability and arousal levels. His discharge has been extended by a week to account for these changes. He has been requiring more assist with transfers d/t this and is working hard to get back to his prior level and continue progressing. He was demonstrating behaviors consistent with a RLAS IV for several days but is now more consistent with a RLAS III again. Family education is ongoing with his parents and girlfriend Carl Carpenter and a family meeting has been held to address expectations and POC.   Patient continues to demonstrate the following deficits: muscle weakness and muscle joint tightness, decreased cardiorespiratoy endurance, abnormal tone, decreased coordination, and decreased motor planning, decreased visual perceptual skills, decreased attention to left, decreased initiation, decreased attention, decreased awareness, decreased problem solving, decreased safety awareness, decreased memory, delayed processing, and demonstrates behaviors consistent with Rancho Level III-IV, and decreased sitting balance, decreased standing balance, decreased postural control, decreased balance strategies, and difficulty maintaining precautions and therefore will continue to benefit from skilled OT intervention to enhance overall performance with BADL and Reduce care partner burden.  Patient progressing toward long term goals..  Continue plan of care.  OT Short Term Goals Week 5:  OT Short Term Goal 1 (Week 5): Pt's caregiver will demonstrate competency in bed level bathing and dressing OT Short  Term Goal 1 - Progress (Week 5): Met OT Short Term Goal 2 (Week 5): Pt's caregivers will demonstrate competency and safety in ADL transfers using LRAD OT Short Term Goal 2 - Progress (Week 5): Progressing toward goal OT Short Term Goal 3 (Week 5): Pt will complete stedy transfer with no more than mod A, +2 for safety not assist OT Short Term Goal 3 - Progress (Week 5): Progressing toward goal Week 6:  OT Short Term Goal 1 (Week 6): Pt will initiate 25% of all commands during 1 simple ADL task OT Short Term Goal 2 (Week 6): Pt will complete stedy transfer with no more than mod +2 OT Short Term Goal 3 (Week 6): Pt will maintain sustained attention to simple ADL task for 30 sec  Skilled Therapeutic Interventions/Progress Updates:    Pt received sitting up in the TIS w/c with his father present, no indications of pain but soaking wet in urine. Session focused on simple command following, initiation, and transfer training at shower level to provide more natural environment for initiation with bathing. He completed a stand pivot transfer with max +2 assist to the shower chair (bariatric tilt). He sat for bathing, extensor tone present in the LLE and causing him to gradually slide out of the chair, requiring total A to scoot back. Pt had little to no initiation or command following, perseveratively holding washcloth. Max HOH used and unsuccessful. Suspect pt fatigued from morning session. He was transferred to the TIS w/c with total A +2. He completed dressing seated with total A +2 to don pants. Multiple attempts needed to stand d/t fatigue and lack of participation by pt. Dressing around PEG changed and binder donned for protection. Total A to don shirt. Max +3 assist squat pivot transfer.  Pt left supine with all needs met, RN and NT present.   Therapy Documentation Precautions:  Precautions Precautions: Fall Precaution Booklet Issued: No Precaution Comments: PEG with binder Restrictions Weight Bearing  Restrictions: No  Therapy/Group: Individual Therapy  [name] 06/17/2022, 8:09 AM

## 2022-06-17 NOTE — Progress Notes (Signed)
Physical Therapy Session Note  Patient Details  Name: Carl Carpenter MRN: [id number] Date of Birth: 1986/04/18  Today's Date: 06/17/2022 PT Individual Time: 1502-1600 PT Individual Time Calculation (min): 58 min   Short Term Goals: Week 6:  PT Short Term Goal 1 (Week 6): Pt will complete bed mobility with modA +1. PT Short Term Goal 2 (Week 6): Pt will complete bed to chair transfer with modA +1. PT Short Term Goal 3 (Week 6): Pt will ambulate 100' with modA +2 and LRAD.  Skilled Therapeutic Interventions/Progress Updates:     Pt received supine in bed and in no apparent distress. Pt performs bilateral rolling with maxA +2 to assist with donning shorts for therapy. Supine to sit with maxA +2 and cues for hand placement and sequencing. Stand pivot transfer to [facility] with maxA +1 and minA +2 to guide hips. Cues for hand placement and initiation. WC transport to gym. Stand pivot transfer to mat table with same cueing and assist levels. Pt performs sitting balance activities in short sitting on edge of mat with exercise ball placed posterior to pt to promote optimal posture and stability. Pt performs ball catching activity to promote engagement with therapy through automatic activities. Pt attends to ball ~50% of time and reaches to catch ball with L hand consistently. Occasionally will use R hand to assist with cueing to attend to R side. Pt then performs "ricochet" activity, in which he holds 3lb bar with both hands and hits ball tossed at him by rehab tech. PT provides hand over hand assistance on R side to promote grip and also assists with reaching bar toward ball, but pt is eventually able to hold bar with both hands without manual assistance. Activity transitioned to holding ball and reaching to put in basketball hoop, which pt attends to with more consistency than previous activity. Pt actively reaches toward hoop with ball and is able to do so with both hands with PT providing manual assistance for  elbow extension on R side.   Pt performs sit to stand and ambulates x15' with 3 musketeers technique and maxA +2, with very little initiation on L or R sides. Pt transitioned to EVA walker with similar results, so seated rest break taken. On following attempt, pt has +3 assist, with 3rd person providing minA/modA at trunk to promote upright posture and engagement with activity, and pt has much improved initiation on both sides, ambulating x50' with maxA +2 and minA from 3rd therapist. Pt also verbalizes that he is going to "walk" and gives thumbs up when asked if he would like to walk again prior to final bout.   Pt given choice of remaining in chair or going to bed and pt states "bed". Stand pivot back to bed with maxA +!Marland Kitchen Left supine with alarm intact and all needs within reach.  Therapy Documentation Precautions:  Precautions Precautions: Fall Precaution Booklet Issued: No Precaution Comments: PEG with binder Restrictions Weight Bearing Restrictions: No  Therapy/Group: Individual Therapy  [name], PT, DPT 06/17/2022, 4:44 PM

## 2022-06-17 NOTE — Progress Notes (Signed)
Physical Therapy Session Note  Patient Details  Name: Carl Carpenter MRN: [id number] Date of Birth: June 05, 1986  Today's Date: 06/17/2022 PT Individual Time: 0830-0900 PT Individual Time Calculation (min): 30 min   Short Term Goals: Week 6:  PT Short Term Goal 1 (Week 6): Pt will complete bed mobility with modA +1. PT Short Term Goal 2 (Week 6): Pt will complete bed to chair transfer with modA +1. PT Short Term Goal 3 (Week 6): Pt will ambulate 100' with modA +2 and LRAD.  Skilled Therapeutic Interventions/Progress Updates:     Patient in bed with RN provided meds through PEG tube with patient's father at bedside upon PT arrival. Patient alert and agreeable to PT session. Patient denied pain during session.  Patient's father with questions about inconsistencies with nursing staff with med admin and tube feeds. Requesting if family could be educated and cleared to assist/perform this for increased consistency, Carl Carpenter, nurse case manager made aware.  Patient with increased arousal and eye contact with provider this session, continues to neglect R hemi-body, visual scanning, and head turn. Increased L upper and lower extremity extensor tone today compared to earlier this week. Per patient's father, SLP with recommendations for use of 1 finger for yes and 2 fingers for no with some succuss throughout session. He would perseverate on the last number and required hand-over-hand assist to relax his L hand between questions.   Therapeutic Activity: Bed Mobility: Patient performed supine to sit with mod-max A +2. Provided max multimodal cues for initiation and sequencing. Patient's dad provided second person assist and cues to increased engagement from patient and for education on safe body mechanics for assist technique.  Patient sat EOB >5 min with min-mod A for sitting balance. Placed BSC by the bed and asked patient if he needed to void or have a BM using yes/no questions, patient used 2 fingers to  respond no to both. Patient donned shorts with max-total A, patient lifted his L leg to thread his lower extremity through, but no initiation with R lower extremity despite max multimodal cuing.  Transfers: Patient performed sit to/from stand x1 with max-mod A and min-mod A +2 and total A to don shorts from +2. He performed stand pivot bed>TIS w/c with mod-max A with min A from +2 to boost up. Provided verbal cues for hand placement with hand-over-hand assist, initiation, and sequencing with facilitation for pivot.  Patient in TIS w/c with his father in the room at end of session with breaks locked and all needs within reach.   Therapy Documentation Precautions:  Precautions Precautions: Fall Precaution Booklet Issued: No Precaution Comments: PEG with binder Restrictions Weight Bearing Restrictions: No    Therapy/Group: Individual Therapy  [name] PT, DPT  06/17/2022, 12:09 PM

## 2022-06-17 NOTE — Progress Notes (Addendum)
Speech Language Pathology TBI Note  Patient Details  Name: Carl Carpenter MRN: [id number] Date of Birth: 12-Jul-1986  Today's Date: 06/17/2022 SLP Individual Time: 0917-1000 SLP Individual Time Calculation (min): 43 min  Short Term Goals: Week 6: SLP Short Term Goal 1 (Week 6): Pt will consume therapeutic trials of 1/2 teaspoons of thin liquids and ice chips with minimal overt s/s of aspiration over 2 consecutive sessions and max assist use of swallowing precautions prior to MBS or FEES. - Following Max A for oral care, delayed cough noted on 3 out of 3 therapeutic PO trials of ice chips and spoon dipped in water.  SLP Short Term Goal 2 (Week 6): Pt will verbalize on command in 25% of opportunities with max assist multimodal when given chocies from a field of 2. - Verbalized on command on 4 out of 5 trials given Mod A multimodal cues to include SLP model prompt. Severe dysarthria noted with speech intelligibility subjectively judged to be 50%.  SLP Short Term Goal 3 (Week 6): Pt will answer basic yes/no questions using via gestures or verbalizations in 50% of opportunities with max assist multimodal cues over 2 sessions. - Pt answered personal and environmental yes/no questions (that could be confirmed) utilizing low-tech yes/no boards with 70% accuracy given extended processing time, repetition, and Mod A multimodal cues for motor initiation and pointing to target response.  SLP Short Term Goal 4 (Week 6): Pt will demonstre sustained attention on a targeted stimulus for 2 minutes with max assist multimodal cues over 2 sessions. - Pt demonstrated sustained attention for ~3 minutes to participate in receptive identification task targeting identification of various pictured nouns, in a field of 2, and high contrast background with 80% accuracy and Mod A multimodal cues.  SLP Short Term Goal 5 (Week 6): Pt will follow 1 step  commands in 75% of opportunities with max assist multimodal cues over 2  sessions. - Pt followed one-step commands within functional contexts ~75% of the time given overall Mod A multimodal cues.   SLP Short Term Goal 6 (Week 6): Patient will initiate functional tasks with Mod A multimodal cues. - Pt initiated brushing teeth, using oral suction, and wiping face with wash cloth given Mod to Mod-Max A multimodal cues.  Skilled Therapeutic Interventions: S: Pt seen this date for skilled ST intervention targeting deglutition and cognitive-linguistic goals outlined above. Pt receive awake/alert and OOB in TIS. L gaze/head preference persists, though improved eye contact noted throughout. Less restlessness noted. Decrease in coughing this date. Father present. Participatory in ST intervention in hospital room. Provided oral care with Max to Total A.  O: Please see above for objective data re: pt's performance on targeted goals. Provided ongoing family education re: ST POC, multimodal means of communication, and pt's performance on targeted goals.   A: Pt remains stimulable for skilled ST intervention as evident by interacting with low-tech yes/no board, following more functional commands, and verbalizing on command during receptive identification task. Recommend ongoing ST intervention.  P: Pt left in TIS with father present. Continue per current ST POC.  Pain No indication of pain; NAD  Agitated Behavior Scale: TBI Observation Details Observation Environment: CIR Start of observation period - Date: 06/16/22 Start of observation period - Time: 0915 End of observation period - Date: 06/16/22 End of observation period - Time: 1000 Agitated Behavior Scale (DO NOT LEAVE BLANKS) Short attention span, easy distractibility, inability to concentrate: Present to a moderate degree Impulsive, impatient, low tolerance for pain  or frustration: Present to a slight degree Uncooperative, resistant to care, demanding: Absent Violent and/or threatening violence toward people or  property: Absent Explosive and/or unpredictable anger: Absent Rocking, rubbing, moaning, or other self-stimulating behavior: Absent Pulling at tubes, restraints, etc.: Absent Wandering from treatment areas: Absent Restlessness, pacing, excessive movement: Absent Repetitive behaviors, motor, and/or verbal: Absent Rapid, loud, or excessive talking: Absent Sudden changes of mood: Absent Easily initiated or excessive crying and/or laughter: Absent Self-abusiveness, physical and/or verbal: Absent Agitated behavior scale total score: 17  Therapy/Group: Individual Therapy  Deziah Renwick A Alanie Syler 06/17/2022, 1:07 PM

## 2022-06-17 NOTE — Progress Notes (Signed)
PROGRESS NOTE   Subjective/Complaints:  Pt has been more engaging with staff and family. Cough improved.   ROS: Limited due to cognitive/behavioral   Objective:   No results found. Recent Labs    06/14/22 1513 06/15/22 0645  WBC 9.3 7.8  HGB 15.6 15.5  HCT 43.6 43.4  PLT 226 196       No results for input(s): "NA", "K", "CL", "CO2", "GLUCOSE", "BUN", "CREATININE", "CALCIUM" in the last 72 hours.      No intake or output data in the 24 hours ending 06/17/22 0911            Physical Exam: Vital Signs Blood pressure 123/81, pulse 81, temperature 97.8 F (36.6 C), temperature source Axillary, resp. rate 16, height 6\' 5"  (1.956 m), weight 103.2 kg, SpO2 97 %.   Constitutional: No distress . Vital signs reviewed. HEENT: NCAT, EOMI, oral membranes moist Neck: supple Cardiovascular: RRR without murmur. No JVD    Respiratory/Chest: CTA Bilaterally without wheezes or rales. Normal effort    GI/Abdomen: BS +, non-tender, non-distended Ext: no clubbing, cyanosis, or edema Psych: flat, slowed Skin: No evidence of breakdown, no evidence of rash Neurologic: makes better eye contact. Engaged with right side and examiner with verbal cueing. Extends LLE 5/5 to command . 4/5 on RIght with cueing. Some posturing still.  Musculoskeletal: no pain with active assisted ROM in UE and LE   Assessment/Plan: 1. Functional deficits which require 3+ hours per day of interdisciplinary therapy in a comprehensive inpatient rehab setting. Physiatrist is providing close team supervision and 24 hour management of active medical problems listed below. Physiatrist and rehab team continue to assess barriers to discharge/monitor patient progress toward functional and medical goals  Care Tool:  Bathing    Body parts bathed by patient: Chest, Face, Front perineal area   Body parts bathed by helper: Right arm, Left arm, Abdomen,  Right upper leg, Left upper leg, Buttocks, Left lower leg, Right lower leg     Bathing assist Assist Level: 2 Helpers     Upper Body Dressing/Undressing Upper body dressing   What is the patient wearing?: Pull over shirt    Upper body assist Assist Level: Total Assistance - Patient < 25%    Lower Body Dressing/Undressing Lower body dressing      What is the patient wearing?: Pants, Underwear/pull up     Lower body assist Assist for lower body dressing: 2 Helpers     Toileting Toileting    Toileting assist Assist for toileting: 2 Helpers     Transfers Chair/bed transfer  Transfers assist  Chair/bed transfer activity did not occur: Safety/medical concerns  Chair/bed transfer assist level: 2 Helpers     Locomotion Ambulation   Ambulation assist   Ambulation activity did not occur: Safety/medical concerns          Walk 10 feet activity   Assist  Walk 10 feet activity did not occur: Safety/medical concerns        Walk 50 feet activity   Assist Walk 50 feet with 2 turns activity did not occur: Safety/medical concerns         Walk 150 feet activity  Assist Walk 150 feet activity did not occur: Safety/medical concerns         Walk 10 feet on uneven surface  activity   Assist Walk 10 feet on uneven surfaces activity did not occur: Safety/medical concerns         Wheelchair     Assist Is the patient using a wheelchair?: Yes Type of Wheelchair: Manual    Wheelchair assist level: Dependent - Patient 0%      Wheelchair 50 feet with 2 turns activity    Assist        Assist Level: Dependent - Patient 0%   Wheelchair 150 feet activity     Assist      Assist Level: Dependent - Patient 0%   Blood pressure 123/81, pulse 81, temperature 97.8 F (36.6 C), temperature source Axillary, resp. rate 16, height 6\' 5"  (1.956 m), weight 103.2 kg, SpO2 97 %.    Medical Problem List and Plan: 1. 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  DVT right posterior tibial and peroneal veins (03/22/22): Discussed with mother that repeat doppler shows no new clots and resolution of one previous clot.  -anticoagulation: continue Eliquis             -antiplatelet therapy: none 3. Pain: continueTylenol prn 4. Behavior/sleep: LCSW to evaluate and provide emotional support             -antipsychotic agents: none  -increasing propranolol for restlessness  -continue low dose seroquel at night for restlessness/agitation  -6/27: added celexa 10mg  qhs for depression/irritability  5. Impaired attention/cognition: This patient is not capable of making decisions on his own behalf.             -6/26 pt has taken a step back cognitively after numerous meds were suddenly stopped last week including ritalin.   6/28-resumed ritalin 10mg  bid this week. Increase to 15mg  bid today 6. Dysphagia. S/p PEG 3/29.  --NPO -continue TF/boluses -Continue zinc oxide to protect skin surrounding PEG tube -5/30 replaced PEG under fluoro. Appreciate IR help Cont PEG feeds 7. Fluids/Electrolytes/Nutrition: Routine Is and Os and follow-up chemistries             --continue tube feeds Jevity 1.5 boluses             --continue Prosource             -  gaining weight  Filed Weights   06/15/22 0414 06/16/22 0500 06/17/22 0500  Weight: 103.2 kg 102.5 kg 103.2 kg    9: C1 right transverse process fracture--he's out of collar 10: Left Occipital condyle fracture--out of collar 11: Acute ventilator respiratory failure requiring trach and PEG 3/29              --decannulated , stoma closed             6/27 -will schedule robitussin again  -resume robinul via  PEG    -recent cxr clear. He is having more difficulty clearing secretions  -tachycardia we're seeing is seems to be related to coughing-  HR improved with robinul/schedule robitussin on board 12: Right ear laceration:  healing 14: Tongue laceration: monitor healing 15: Hypertension/tachycardia:        -   propranolol increased to 40mg  qid  06/17/22 improved     06/17/2022    5:00 AM 06/17/2022    4:02 AM 06/16/2022   10:08 PM  Vitals with BMI  Weight 227 lbs 8 oz    Systolic  123 113  Diastolic  81 88  Pulse  81 92    16. Hypoglycemic to 69: otherwise in 90s: will d/c ISS since not requiring   18. Iron deficiency:  conitnue daily iron supplement  19. Tremor,?clonus: continue propranolol 40mg  qid 20, Constipation: ?mild  -moving bowels 21. Increased extensor tone. inconsistent -U/A, CXR negative  22. Slightly Elevated Ammonia: resolved  -potentially d/t vpa     LOS: 40 days A FACE TO FACE EVALUATION WAS PERFORMED  06/18/2022 06/17/2022, 9:11 AM

## 2022-06-18 LAB — GLUCOSE, CAPILLARY
Glucose-Capillary: 101 mg/dL — ABNORMAL HIGH (ref 70–99)
Glucose-Capillary: 104 mg/dL — ABNORMAL HIGH (ref 70–99)
Glucose-Capillary: 106 mg/dL — ABNORMAL HIGH (ref 70–99)
Glucose-Capillary: 122 mg/dL — ABNORMAL HIGH (ref 70–99)
Glucose-Capillary: 145 mg/dL — ABNORMAL HIGH (ref 70–99)
Glucose-Capillary: 99 mg/dL (ref 70–99)

## 2022-06-18 NOTE — Progress Notes (Signed)
PROGRESS NOTE   Subjective/Complaints:  Pt engaging more with family and staff. Less coughing, appears more comfortable at night  ROS: Limited due to cognitive/behavioral   Objective:   No results found. No results for input(s): "WBC", "HGB", "HCT", "PLT" in the last 72 hours.      No results for input(s): "NA", "K", "CL", "CO2", "GLUCOSE", "BUN", "CREATININE", "CALCIUM" in the last 72 hours.       Intake/Output Summary (Last 24 hours) at 06/18/2022 0953 Last data filed at 06/17/2022 1701 Gross per 24 hour  Intake 1148 ml  Output --  Net 1148 ml              Physical Exam: Vital Signs Blood pressure 102/64, pulse 87, temperature 98.7 F (37.1 C), resp. rate 14, height 6\' 5"  (1.956 m), weight 104.1 kg, SpO2 96 %.   Constitutional: No distress . Vital signs reviewed. HEENT: NCAT, EOMI, oral membranes moist Neck: supple Cardiovascular: RRR without murmur. No JVD    Respiratory/Chest: CTA Bilaterally without wheezes or rales. Normal effort    GI/Abdomen: BS +, non-tender, non-distended Ext: no clubbing, cyanosis, or edema Psych: flat  Skin: No evidence of breakdown, no evidence of rash Neurologic: making eye contact and engaging with right side with cues.  Extends LLE 5/5 to command . 4/5 on RIght with cueing.   Musculoskeletal: no pain with active assisted ROM in UE and LE   Assessment/Plan: 1. Functional deficits which require 3+ hours per day of interdisciplinary therapy in a comprehensive inpatient rehab setting. Physiatrist is providing close team supervision and 24 hour management of active medical problems listed below. Physiatrist and rehab team continue to assess barriers to discharge/monitor patient progress toward functional and medical goals  Care Tool:  Bathing    Body parts bathed by patient: Chest, Face, Front perineal area   Body parts bathed by helper: Right arm, Left arm,  Abdomen, Right upper leg, Left upper leg, Buttocks, Left lower leg, Right lower leg     Bathing assist Assist Level: 2 Helpers     Upper Body Dressing/Undressing Upper body dressing   What is the patient wearing?: Pull over shirt    Upper body assist Assist Level: Total Assistance - Patient < 25%    Lower Body Dressing/Undressing Lower body dressing      What is the patient wearing?: Pants, Underwear/pull up     Lower body assist Assist for lower body dressing: 2 Helpers     Toileting Toileting    Toileting assist Assist for toileting: 2 Helpers     Transfers Chair/bed transfer  Transfers assist  Chair/bed transfer activity did not occur: Safety/medical concerns  Chair/bed transfer assist level: 2 Helpers     Locomotion Ambulation   Ambulation assist   Ambulation activity did not occur: Safety/medical concerns          Walk 10 feet activity   Assist  Walk 10 feet activity did not occur: Safety/medical concerns        Walk 50 feet activity   Assist Walk 50 feet with 2 turns activity did not occur: Safety/medical concerns         Walk 150 feet activity  Assist Walk 150 feet activity did not occur: Safety/medical concerns         Walk 10 feet on uneven surface  activity   Assist Walk 10 feet on uneven surfaces activity did not occur: Safety/medical concerns         Wheelchair     Assist Is the patient using a wheelchair?: Yes Type of Wheelchair: Manual    Wheelchair assist level: Dependent - Patient 0%      Wheelchair 50 feet with 2 turns activity    Assist        Assist Level: Dependent - Patient 0%   Wheelchair 150 feet activity     Assist      Assist Level: Dependent - Patient 0%   Blood pressure 102/64, pulse 87, temperature 98.7 F (37.1 C), resp. rate 14, height 6\' 5"  (1.956 m), weight 104.1 kg, SpO2 96 %.    Medical Problem List and Plan: 1. Functional deficits secondary to severe  traumatic brain injury 03/10/22 d/t MCA, ventilator dependent respiratory failure status post tracheostomy and PEG placement             -RLAS IV             -patient may shower from a medical standpoint             -ELOS/Goals: 7/14  -Continue CIR therapies including PT, OT, and SLP   2.  DVT right posterior tibial and peroneal veins (03/22/22): Discussed with mother that repeat doppler shows no new clots and resolution of one previous clot.  -anticoagulation: continue Eliquis             -antiplatelet therapy: none 3. Pain: continueTylenol prn 4. Behavior/sleep: LCSW to evaluate and provide emotional support             -antipsychotic agents: none  -increasing propranolol for restlessness  -continue low dose seroquel at night for restlessness/agitation  -6/27: added celexa 10mg  qhs for depression/irritability  5. Impaired attention/cognition: This patient is not capable of making decisions on his own behalf.             -6/26 pt has taken a step back cognitively after numerous meds were suddenly stopped last week including ritalin.   6/29-observe with ritalin 15mg  bid today 6. Dysphagia. S/p PEG 3/29.  --NPO -continue TF/boluses -Continue zinc oxide to protect skin surrounding PEG tube -5/30 replaced PEG under fluoro. Appreciate IR help Cont PEG feeds 7. Fluids/Electrolytes/Nutrition: Routine Is and Os and follow-up chemistries             --continue tube feeds Jevity 1.5 boluses             --continue Prosource             -  gaining weight  Filed Weights   06/16/22 0500 06/17/22 0500 06/18/22 0358  Weight: 102.5 kg 103.2 kg 104.1 kg    9: C1 right transverse process fracture--he's out of collar 10: Left Occipital condyle fracture--out of collar 11: Acute ventilator respiratory failure requiring trach and PEG 3/29              --decannulated , stoma closed             6/27 -will schedule robitussin again  -resume robinul via PEG    -recent cxr clear.    -tachycardia- improved  with robinul/schedule robitussin on board 12: Right ear laceration:  healing 14: Tongue laceration: monitor healing 15: Hypertension/tachycardia:        -  propranolol increased to 40mg  qid  06/18/22 improved     06/18/2022    3:58 AM 06/17/2022    7:50 PM 06/17/2022    1:18 PM  Vitals with BMI  Weight 229 lbs 8 oz    Systolic 102 126 06/19/2022  Diastolic 64 78 85  Pulse 87 89 109    16. Hypoglycemic to 69: otherwise in 90s: will d/c ISS since not requiring   18. Iron deficiency:  conitnue daily iron supplement  19. Tremor,?clonus: continue propranolol 40mg  qid 20, Constipation: ?mild  -moving bowels 21. Increased extensor tone. inconsistent -U/A, CXR negative  22. Slightly Elevated Ammonia: resolved  -potentially d/t vpa     LOS: 41 days A FACE TO FACE EVALUATION WAS PERFORMED  263 06/18/2022, 9:53 AM

## 2022-06-18 NOTE — Progress Notes (Signed)
Family education initiated on administering meds and tube feeds through peg tube with pt's partner, [name]. Family is eager to learning and demonstrated proper techniques. Will continue to reinforce.   Marylu Lund, RN

## 2022-06-18 NOTE — Progress Notes (Signed)
Speech Language Pathology Weekly Progress and Session Note  Patient Details  Name: Carl Carpenter MRN: [id number] Date of Birth: 1986-01-21  Beginning of progress report period: June 11, 2022 End of progress report period: June 18, 2022  Today's Date: 06/18/2022 SLP Individual Time: 8144-8185 Total Time: 48 minutes   Short Term Goals: Week 6: SLP Short Term Goal 1 (Week 6): Pt will consume therapeutic trials of 1/2 teaspoons of thin liquids and ice chips with minimal overt s/s of aspiration over 2 consecutive sessions and max assist use of swallowing precautions prior to MBS or FEES. - Total A for oral care. Total A for consumed 1/2 tsp of thin liquid via siphoned straw placed in L buccal cavity. Significantly delayed swallow initiation noted on 4 out of 4 trials with need for dry spoon on center of tongue + hyoglossal assistance to elicit oral responsiveness with eventual swallow initiation. Delayed cough noted on 1 out of 4 trials. SLP Short Term Goal 1 - Progress (Week 6): Not met  SLP Short Term Goal 2 (Week 6): Pt will verbalize on command in 25% of opportunities with max assist multimodal when given chocies from a field of 2. - Verbalized on ~30% of opportunities given Mod-Max A multimodal cues to imitate SLP's verbalizations. No spontaneous verbalizations noted. Did not verbalize when given verbal choice prompts. SLP Short Term Goal 2 - Progress (Week 6): Not met  SLP Short Term Goal 3 (Week 6): Pt will answer basic yes/no questions using via gestures or verbalizations in 50% of opportunities with max assist multimodal cues over 2 sessions. - 50% accuracy when responding to personal yes/no questions with low-tech AAC yes/no boards placed in L field and Mod-Max A multimodal cues for initiation, motor sequencing, and attention. SLP Short Term Goal 3 - Progress (Week 6): Met  SLP Short Term Goal 4 (Week 6): Pt will demonstre sustained attention on a targeted stimulus for 2 minutes with max  assist multimodal cues over 2 sessions. - Attended to targeted stimulus (iPad) for 2 minutes with Mod A multimodal cues. SLP Short Term Goal 4 - Progress (Week 6): Not Met  SLP Short Term Goal 5 (Week 6): Pt will follow 1 step  commands in 75% of opportunities with max assist multimodal cues over 2 sessions. - Followed one-step commands ~75% of the time within functional tasks given Mod to Mod-Max A multimodal cues.   SLP Short Term Goal 5 - Progress (Week 6): Met  SLP Short Term Goal 6 (Week 6): Patient will initiate functional tasks with Mod A multimodal cues. - Initiated functional tasks re: suctioning oral cavity and wiping face with Mod-Max A multimodal cues for initiation and motor sequencing + planning. SLP Short Term Goal 6 - Progress (Week 6): Not met   New Short Term Goals: Week 7: SLP Short Term Goal 1 (Week 7): Pt will consume therapeutic trials of 1/2 teaspoons of thin liquids and ice chips with minimal overt s/s of aspiration over 2 consecutive sessions and max assist use of swallowing precautions prior to MBS or FEES. SLP Short Term Goal 2 (Week 7): Pt will verbalize on command in 25% of opportunities with max assist multimodal when given chocies from a field of 2. SLP Short Term Goal 3 (Week 7): Pt will answer basic yes/no questions using via gestures or verbalizations in 50% of opportunities with mod assist multimodal cues over 2 sessions. SLP Short Term Goal 4 (Week 7): Pt will demonstrate sustained attention on a targeted stimulus  for 2 minutes with max assist multimodal cues over 2 sessions. SLP Short Term Goal 5 (Week 7): Pt will follow 1 step  commands in 75% of opportunities with mod assist multimodal cues over 2 sessions. SLP Short Term Goal 6 (Week 7): Patient will initiate functional tasks with Mod A multimodal cues.  Weekly Progress Updates: Patient has demonstrated subtle gains as evident by meeting 2 out of 6 STG's this reporting period. Currently, pt demonstrates  subtle improvement in command following within the context of highly functional/contextual and familiar tasks given Mod to Max A multimodal cues. Is beginning to intermittently verbalize single words following SLP production. Improved eye contact and scanning to R with Mod-Max A. Engaging with low-tech yes/no board, when asked personal questions (that can be confirmed), given Mod-Max to Max A. Improved attention to visual stimulus when placed on highly contrastive background. Patient continues to present with decreased oral manipulation and significantly delayed swallow initiation with intermittent cough on the majority of ice chip and 1/2 tsp trials of thin water. Did better with water siphoned into straw and placed within L buccal cavity following Beckman stretches to bring awareness to orofacial musculature. Continue to recommend patient remain NPO; not demonstrating readiness for MBSS at this time. Patient and family education ongoing. Patient would benefit from continued skilled SLP intervention to maximize his swallowing and cognitive-linguistic functioning prior to discharge.    Intensity: Minumum of 1-2 x/day, 30 to 90 minutes Frequency: 3 to 5 out of 7 days Duration/Length of Stay: 07/03/22 Treatment/Interventions: Cognitive remediation/compensation;Cueing hierarchy;Dysphagia/aspiration precaution training;Environmental controls;Internal/external aids;Speech/Language facilitation;Patient/family education;Functional tasks;Multimodal communication approach;Therapeutic Activities   Daily Session Skilled Therapeutic Interventions:  S: Pt seen this date for skilled ST intervention targeting cognitive-linguistic and deglutition goals outlined above. Pt received with eyes closed. Significant other present. Aroused easily to name. No s/sx of discomfort nor distress. Participatory in therapy.  O: Please see above for objective date re: pt's performance on targeted goals. Education provided to pt's s/o  re: applications that may be helpful to begin utilizing outside of session, as able to include ([employer] Language applications, yes/no applications, and simple picture applications). Pt's s/o verbalized understanding and appreciative of information. 80% accuracy with receptive identification of nouns on iPad application given Mod-Max A for motor initiation/planning of LUE and attending to R field.  A: Pt remains stimulable to skilled ST intervention targeting goals outlined in care plan. Continues to demonstrate subtle progress towards meeting short-term goals, following more simple, familiar commands, and intermittently imitating simple verbalizations of single words with pictures present and automatic greetings ("morning" and "bye").  P: Pt left in bed with s/o present.  Direct hand off to RN. Continue per updated ST POC.    Pain No indication of pain; pt unable to express  Therapy/Group: Individual Therapy   A  06/18/2022, 3:39 PM

## 2022-06-18 NOTE — Progress Notes (Signed)
Physical Therapy Session Note  Patient Details  Name: Carl Carpenter MRN: [id number] Date of Birth: 05-24-1986  Today's Date: 06/18/2022 PT Individual Time: 1046-1200 and 1401-1430 PT Individual Time Calculation (min): 74 min and 29 min  Short Term Goals: Week 6:  PT Short Term Goal 1 (Week 6): Pt will complete bed mobility with modA +1. PT Short Term Goal 2 (Week 6): Pt will complete bed to chair transfer with modA +1. PT Short Term Goal 3 (Week 6): Pt will ambulate 100' with modA +2 and LRAD.  Skilled Therapeutic Interventions/Progress Updates:     1st Session: Pt received supine in bed and in no apparent distress. Supine to sit with maxA +1 and cues for hand placement and use of bed rails. Pt with notable improvement in attention to task. Pt performs squat pivot to WC with maxA +1 and modA +2. Cues for hand placement and initiation. WC transport to gym for time management. Pt performs stand pivot transfer to mat table with same assist, with cues for sequencing. Pt asked how he's doing today and he responds "I'm good." Pt shakes head "no" when asked if he is in pain. PT asks pt if he would rather walk or lay down and pt says "lay down". Sit to supine with maxA +2 and cues for sequencing. Pt performs x5 supine bridges with verbal and tactile cueing, and pt has clear initiation, activating bilateral glutes. Extended rest break following first set. Pt performs 2nd set with verbal and tactile cues to facilitate motor pattern and glute engagement.  Pt performs supine to sit with maxA +2 and cues for logrolling and sequencing. Sit to stand with 3 musketeers technique and maxA +2, with cues for anterior weight shift, power-up, and trunk and cervical extension. Pt attempts ambulation with +3 to assist with attention to task and upright posture. Pt only able to complete ~15' with difficulty initiating on both R and L sides, and appearing to lack attention to complete task, perhaps due to fatigue. Pt takes  seated rest break and WC transported outside. Pt taken outdoors for multisensory stimulation and change of environment. Pt performs sit to stand and works on static standing balance, with multimodal cues to promote weight distribution, upright posture, knee and hip extension, and attention to task.   WC transport back to room. Pt asked if he would like to stay in chair or go back to bed and pt verbalizes "bed". Stand pivot to bed with maxA +1. Left supine with alarm intact and all needs within reach.  2nd Session: Pt received supine in bed and nods agreement to therapy. No complaint of pain. Supine to sit with maxA +2 and cues for hand placement and sequencing. Pt performs stand pivot transfer from bed>WC with maxA +2 and cues for hand placement, initiation, power-up, sequencing, and positioning. Pt facing high low mat, performs sit to stand with maxA +2, and performs hand over hand "crawl" onto mat to position pt in quadruped. Pt able to get both knees up onto mat, initiating motion on both sides. Once on mat, pt hips extend and pt slowly lowers chest onto mat with PT positioned underneath pt. +4 required to assist pt back into quadruped. Manual over pressure provided on R upper arm to promote extension and WB, and rehab tech positioned behind pt to provide hip stability and prevent anterior/posterior shifting. Pt transitions into tall kneeling with +3 assistance, and is able to progress to tall kneeling with +2 assist, with multimodal cues at  pelvis and trunk. Pt assisted into prone and then maxA +2 for return to sitting. Pt handed off to OT following stand pivot to [facility] with maxA +1.  Therapy Documentation Precautions:  Precautions Precautions: Fall Precaution Booklet Issued: No Precaution Comments: PEG with binder Restrictions Weight Bearing Restrictions: No    Therapy/Group: Individual Therapy  [name], PT, DPT 06/18/2022, 5:05 PM

## 2022-06-18 NOTE — Progress Notes (Signed)
Occupational Therapy TBI Note  Patient Details  Name: Carl Carpenter MRN: [id number] Date of Birth: [birth year]  Today's Date: 06/18/2022 OT Individual Time: 1430-1502 OT Individual Time Calculation (min): 32 min    Short Term Goals: Week 6:  OT Short Term Goal 1 (Week 6): Pt will initiate 25% of all commands during 1 simple ADL task OT Short Term Goal 2 (Week 6): Pt will complete stedy transfer with no more than mod +2 OT Short Term Goal 3 (Week 6): Pt will maintain sustained attention to simple ADL task for 30 sec  Skilled Therapeutic Interventions/Progress Updates:    Patient received in tilt in space wheelchair following PT treatment.  Skilled OT intervention to address attention, postural control, direction following, use of RUE for grasp and release, and functional mobility.  Patient able to match colors in right and left visual field.  Cueing needed initially to scan to right of midline.  Patient then asked to stack blocks into towers of 2 then 3.  Patient initially inattentive to RUE, but with hand over hand cueing initially - patient able to grasp,release and reach toward targets.   Worked on sit to partial stand with UE on elevated mat table to allow adjustment to shorts on both thighs.   Patient returned to room, and updated father on session.  Patient tugging on shorts and dad indicates that this is often indication of patient being wet.  Patient transferred to bed via squat pivot, and nursing checked brief - dry.  Patient allowed to remain in bed with dad at bedside and nursing providing care.    Therapy Documentation Precautions:  Precautions Precautions: Fall Precaution Booklet Issued: No Precaution Comments: PEG with binder Restrictions Weight Bearing Restrictions: No General:   Vital Signs: Therapy Vitals Temp: (!) 96.9 F (36.1 C) Pulse Rate: 94 Resp: 20 BP: 101/84 Patient Position (if appropriate): Lying Oxygen Therapy SpO2: 98 % O2 Device: Room Air Pain:    Agitated Behavior Scale: TBI   Agitated Behavior Scale (DO NOT LEAVE BLANKS) Short attention span, easy distractibility, inability to concentrate: Present to a moderate degree Impulsive, impatient, low tolerance for pain or frustration: Present to a slight degree Uncooperative, resistant to care, demanding: Absent Violent and/or threatening violence toward people or property: Absent Explosive and/or unpredictable anger: Absent Rocking, rubbing, moaning, or other self-stimulating behavior: Absent Pulling at tubes, restraints, etc.: Absent Wandering from treatment areas: Absent Restlessness, pacing, excessive movement: Absent Repetitive behaviors, motor, and/or verbal: Absent Rapid, loud, or excessive talking: Absent Sudden changes of mood: Absent Easily initiated or excessive crying and/or laughter: Absent Self-abusiveness, physical and/or verbal: Absent Agitated behavior scale total score: 17  ADL: ADL Eating: Unable to assess (PEG) Grooming: Dependent Where Assessed-Grooming: Bed level Upper Body Bathing: Dependent Where Assessed-Upper Body Bathing: Bed level Lower Body Bathing: Dependent Where Assessed-Lower Body Bathing: Bed level Upper Body Dressing: Dependent Where Assessed-Upper Body Dressing: Bed level Lower Body Dressing: Dependent Where Assessed-Lower Body Dressing: Bed level Toileting: Dependent Where Assessed-Toileting: Bed level Toilet Transfer: Unable to assess Tub/Shower Transfer: Unable to assess ADL Comments: Grossly dependent at bedlevel. Unable to awaken during bedlevel ADLs at time of eval. Vision   Perception    Praxis   Exercises:   Other Treatments:     Therapy/Group: Individual  Therapy```````````````````````````````````````````````````````````````````````````````````````````````````````````````````````````````````````````````````````````````````````````````````````````````````````````````````````````````````````````````````````````````````````````````````````````````````````````````````````````````````````````````````````````````````````````````````````````````````````````````````````````````````````````````````````````````````````````````````````````````````````````````````````````````````````````````````            Collier Salina 06/18/2022, 3:22 PM

## 2022-06-18 NOTE — Progress Notes (Signed)
Pt had episode of coughing while this nurse was at the bedside. Pt HOB was at 32 degrees and O2 at 97% on RA. Coughing episode lasted 7-8 mins. Dan PA made aware.

## 2022-06-18 NOTE — Progress Notes (Signed)
Occupational Therapy Session Note  Patient Details  Name: Carl Carpenter MRN: [id number] Date of Birth: January 16, 1986  Today's Date: 06/18/2022 OT Individual Time: 0900-0930 OT Individual Time Calculation (min): 30 min    Short Term Goals: Week 1:  OT Short Term Goal 1 (Week 1): Patient will attend to stimuli presented in central vision for 1 min indicting improved arousal/attention. OT Short Term Goal 1 - Progress (Week 1): Not met OT Short Term Goal 2 (Week 1): Patient will maintain static sitting balance at EOB with Mod A for >5 min in prep for ADLs. OT Short Term Goal 2 - Progress (Week 1): Not met OT Short Term Goal 3 (Week 1): Patient will complete sit to stand transfer with Max A +2 in prep for ADLs. OT Short Term Goal 3 - Progress (Week 1): Not met OT Short Term Goal 4 (Week 1): Patient will follow 1-step verbal commands with 25% accuracy during 3 consecutive treatment sessions in prep for ADLs. OT Short Term Goal 4 - Progress (Week 1): Progressing toward goal Week 2:  OT Short Term Goal 1 (Week 2): Pt will maintain arousal for 2 consecutive OT sessions OT Short Term Goal 1 - Progress (Week 2): Met OT Short Term Goal 2 (Week 2): Pt will initiate 25% of commands during ADLs with max cueing OT Short Term Goal 2 - Progress (Week 2): Progressing toward goal OT Short Term Goal 3 (Week 2): Pt will complete 1 simple ADL task with max A OT Short Term Goal 3 - Progress (Week 2): Progressing toward goal OT Short Term Goal 4 (Week 2): Pt will maintain head control EOB with min A OT Short Term Goal 4 - Progress (Week 2): Met  Skilled Therapeutic Interventions/Progress Updates:     The pt was seen this AM in his room. Upon arrival, I greeted the family and indicated the nature of my visit.  The pt and his family were receptive to positioning the pt on the side of the bed and completing some PROM exercise along with some NMR for improve his functional outcome.  The pt was MaxA X2 for coming from  supine to EOB, the pt tolerated PROM of LUE to normalize the tone in the extremity.  The pt was receptive to some command presenting at level III Rancho scale for awareness in relation to verbal prompts to look at his LUE, make a fist with his LUE , and relax the extremity.  The pt returned to supine in bed with MaxA X2 with family present at the time of treatment, the pt bedside table and call light were made available . All additional needs were addressed prior to exiting the room.  Therapy Documentation Precautions:  Precautions Precautions: Fall Precaution Booklet Issued: No Precaution Comments: PEG with binder Restrictions Weight Bearing Restrictions: No GOther Treatments:     Therapy/Group: Individual Therapy  Yvonne Kendall 06/18/2022, 12:14 PM

## 2022-06-18 NOTE — Progress Notes (Signed)
Patient ID: Carl Carpenter, male   DOB: [birth year], 36 y.o.   MRN: [id number]  06/18/22- HHPT/OT/aide/SW/SN referral accepted by [facility]  06/19/22- SW informed pt partner [name] on above. She states she has also been working with speech therapist on exercises and different apps that can be used to help improve his speech.  Cecile Sheerer, MSW, LCSWA Office: [phone] Cell: [phone] Fax: [phone]

## 2022-06-19 LAB — GLUCOSE, CAPILLARY
Glucose-Capillary: 101 mg/dL — ABNORMAL HIGH (ref 70–99)
Glucose-Capillary: 103 mg/dL — ABNORMAL HIGH (ref 70–99)
Glucose-Capillary: 104 mg/dL — ABNORMAL HIGH (ref 70–99)
Glucose-Capillary: 106 mg/dL — ABNORMAL HIGH (ref 70–99)
Glucose-Capillary: 111 mg/dL — ABNORMAL HIGH (ref 70–99)
Glucose-Capillary: 126 mg/dL — ABNORMAL HIGH (ref 70–99)

## 2022-06-19 MED ORDER — METHYLPHENIDATE HCL 5 MG PO TABS
20.0000 mg | ORAL_TABLET | Freq: Two times a day (BID) | ORAL | Status: DC
  Administered 2022-06-19 – 2022-06-29 (×21): 20 mg
  Filled 2022-06-19 (×23): qty 4

## 2022-06-19 NOTE — Progress Notes (Addendum)
Speech Language Pathology Daily Session Note  Patient Details  Name: Carl Carpenter MRN: [id number] Date of Birth: Mar 03, 1986  Today's Date: 06/19/2022 SLP Individual Time: 4098-1191 SLP Individual Time Calculation (min): 55 min  Short Term Goals: Week 7: SLP Short Term Goal 1 (Week 7): Pt will consume therapeutic trials of 1/2 teaspoons of thin liquids and ice chips with minimal overt s/s of aspiration over 2 consecutive sessions and max assist use of swallowing precautions prior to MBS or FEES. -  Following Total Carl for oral care, delayed cough noted on 90% of all trials when 1/2 tsp of thin water was provided via spoon and siphoned straw, in the setting of delayed swallow initiation. Immediate cough when pt sucked thin water from straw though swallow initiation appeared more timely/brisk to digital palpation.   SLP Short Term Goal 2 (Week 7): Pt will verbalize on command in 25% of opportunities with max assist multimodal when given chocies from Carl field of 2. - Given extended processing time and Mod-Max Carl multimodal cues (verbal and written choice prompts), pt verbalized on command in ~50% of opportunities to indicate wants/needs.   SLP Short Term Goal 3 (Week 7): Pt will answer basic yes/no questions using via gestures or verbalizations in 50% of opportunities with mod assist multimodal cues over 2 sessions. - Did not formally address this session.   SLP Short Term Goal 4 (Week 7): Pt will demonstrate sustained attention on Carl targeted stimulus for 2 minutes with max assist multimodal cues over 2 sessions. - Pt visually attended to receptive ID task (Language Therapy application) on iPad for 2 minutes given Mod Carl.  SLP Short Term Goal 5 (Week 7): Pt will follow 1 step  commands in 75% of opportunities with mod assist multimodal cues over 2 sessions. - Pt followed one-step commands r/t orofacial movement and movement of LUE on ~70% of occasions given Mod Carl multimodal cues.   SLP Short Term Goal 6  (Week 7): Patient will initiate functional tasks with Mod Carl multimodal cues. - Mod Carl for task initiation for familiar task re: holding suction, taking washcloth; Max to Total Carl for initiation of tracing name and receptive ID tasks (novel tasks).  Skilled Therapeutic Interventions: S: Pt seen this date for skilled ST intervention targeting cognitive-linguistic and deglutition goals outlined above. Pt received awake/alert and OOB in TIS. Father present. Less restlessness noted and increased eye contact. Continues with L head/gaze preference; however, did turn head to acknowledge SLP in R field upon arrival. No signs of distress appreciated. Participatory in therapy.   O: Please see above for objective data re: pt's performance eon targeted goals. Pt continues to benefit from extended processing time, repetition of verbal commands, model prompts from clinician, and verbal encouragement to participate in verbal and functional tasks. Father present; provided education re: aforementioned interventions; he verbalized understanding though will continue to benefit from reinforcement.  Carl: Pt remains stimulable to skilled ST intervention targeting goals outlined in care plan. Continues to demonstrate subtle progress towards meeting short-term goals to include following more simple, familiar commands, and intermittently imitating simple verbalizations of single words with pictures present and automatic greetings ("morning" and "bye"). May plan for MBSS next week to assess readiness for therapeutic trials of various consistencies.   P: Pt left in TIS with direct hand off to NT and LPN for nursing care. Father present at the conclusion of today's session. All immediate needs met. Continue per current ST POC.  Pain No indication of  pain; NAD  Therapy/Group: Individual Therapy  Carl Carpenter Carl Carpenter 06/19/2022, 12:16 PM

## 2022-06-19 NOTE — Progress Notes (Signed)
Occupational Therapy TBI Note  Patient Details  Name: Carl Carpenter MRN: [id number] Date of Birth: [birth year]  Today's Date: 06/19/2022 OT Individual Time: 0850-1000 OT Individual Time Calculation (min): 70 min    Short Term Goals: Week 6:  OT Short Term Goal 1 (Week 6): Pt will initiate 25% of all commands during 1 simple ADL task OT Short Term Goal 2 (Week 6): Pt will complete stedy transfer with no more than mod +2 OT Short Term Goal 3 (Week 6): Pt will maintain sustained attention to simple ADL task for 30 sec  Skilled Therapeutic Interventions/Progress Updates:    Pt received supine with his father present, who reports he is having a great morning with more verbalizations. He required max A to roll R and total A to roll L for dependent level brief changing and donning of pants. Pt completed bed mobility to EOB with max A. He was able to maintain sitting balance with min-max A overall with max cueing for upright head and trunk as well as sustained attention to task. Poor initiation of motor planning today and mobility but pt able to properly activate transfer once initiated by OT. Max A +2 for sit <> stands- blocked practice to carryover motor plan and encourage more upright posture. Squat pivot transfer to the w/c with max +2 assist. He completed simple grooming tasks at the sink with only mod cueing for initiation, including applying deodorant and brushing hair. Proximal stability provided at the L elbow during overhead reaching. He required cueing for termination of task. He was taken to the therapy gym via w/c. Extra time provided to allow initiation for scooting to the mat- only minimal initiation, requiring max +2 assist for lateral scoot. Used a basketball to provide familiar object to address task initiation, command following, BUE grasp/integration, and BUE forward shoulder flexion as he reached for a basketball hoop. He required mod facilitation at the RUE to maintain grasp and  shoulder adduction. He returned to the w/c with max +2 assist. He was left sitting up with his father present and seat belt on.   Therapy Documentation Precautions:  Precautions Precautions: Fall Precaution Booklet Issued: No Precaution Comments: PEG with binder Restrictions Weight Bearing Restrictions: No  Pain Assessment Pain Scale: Faces Faces Pain Scale: No hurt Agitated Behavior Scale: TBI Observation Details Observation Environment: CIR Start of observation period - Date: 06/19/22 Start of observation period - Time: 0845 End of observation period - Date: 06/19/22 End of observation period - Time: 1000 Agitated Behavior Scale (DO NOT LEAVE BLANKS) Short attention span, easy distractibility, inability to concentrate: Present to a moderate degree Impulsive, impatient, low tolerance for pain or frustration: Present to a slight degree Uncooperative, resistant to care, demanding: Absent Violent and/or threatening violence toward people or property: Absent Explosive and/or unpredictable anger: Absent Rocking, rubbing, moaning, or other self-stimulating behavior: Present to a slight degree Pulling at tubes, restraints, etc.: Absent Wandering from treatment areas: Absent Restlessness, pacing, excessive movement: Absent Repetitive behaviors, motor, and/or verbal: Present to a slight degree Rapid, loud, or excessive talking: Absent Sudden changes of mood: Absent Easily initiated or excessive crying and/or laughter: Absent Self-abusiveness, physical and/or verbal: Absent Agitated behavior scale total score: 19   Therapy/Group: Individual Therapy  Crissie Reese 06/19/2022, 10:42 AM

## 2022-06-19 NOTE — Progress Notes (Signed)
Physical Therapy Weekly Progress Note  Patient Details  Name: Carl Carpenter MRN: [id number] Date of Birth: 01-23-1986  Beginning of progress report period: June 11, 2022 End of progress report period: June 19, 2022  Today's Date: 06/19/2022 PT Individual Time: 4098-1191 PT Individual Time Calculation (min): 57 min   Patient has met 0 of 3 short term goals.  Pt is progressing slowly toward long term goals, having improved performance in all areas of mobility this past recording session, with improved attention to task, initiation, and less resistance than previous week. Pt still requires max to totalA +2 for all forms of mobility, but has begun to verbally respond to PT questions and commands ~25% of time with one word responses. Pt will benefit from more hands-on family education prior to discharge.  Patient continues to demonstrate the following deficits muscle weakness, decreased cardiorespiratoy endurance, impaired timing and sequencing, motor apraxia, decreased coordination, and decreased motor planning, decreased attention to right, decreased initiation, decreased attention, decreased awareness, decreased problem solving, decreased safety awareness, decreased memory, delayed processing, and demonstrates behaviors consistent with Rancho Level III, and decreased sitting balance, decreased standing balance, decreased postural control, and decreased balance strategies and therefore will continue to benefit from skilled PT intervention to increase functional independence with mobility.  Patient progressing toward long term goals..  Continue plan of care.  PT Short Term Goals Week 6:  PT Short Term Goal 1 (Week 6): Pt will complete bed mobility with modA +1. PT Short Term Goal 1 - Progress (Week 6): Progressing toward goal PT Short Term Goal 2 (Week 6): Pt will complete bed to chair transfer with modA +1. PT Short Term Goal 2 - Progress (Week 6): Progressing toward goal PT Short Term Goal 3  (Week 6): Pt will ambulate 100' with modA +2 and LRAD. PT Short Term Goal 3 - Progress (Week 6): Progressing toward goal Week 7:  PT Short Term Goal 1 (Week 7): Pt will complete bed mobility with modA +1. PT Short Term Goal 2 (Week 7): Pt will complete bed to chair transfer with modA +1. PT Short Term Goal 3 (Week 7): Pt will ambulate 100' with modA +2 and LRAD.  Skilled Therapeutic Interventions/Progress Updates:     Pt received supine in bed and agrees to therapy. No complaint of pain. Pt performs bridging in supine to assist with donning shorts, initiating movement with bilateral lower extremities. Pt is able able to roll to R with modA and cues for hand placement and body mechanics. Sidelying to sit requires maxA +2. Stand pivot transfer to [facility] with maxA +2 and cues for anterior weight shift, hand placement, and sequencing. WC transport to gym for time management. Pt completes stand pivot transfer to mat with maxA +2. Pt performs sitting balance practice in short sitting on edge of mat, with large exercise ball placed behind pt to promote upright posture and provide tactile feedback on trunk. Pt manually positioned in forward flexed posture with bilateral forearms on knees to promote functional positioning, and is able to maintain position with minA for 20-30 seconds at a time, and frequent corrections to midline with tendency to push to the R and posteriorly. Pt tasked with reaching forward for clothespins above eye level to challenge attention and coordination. Pt able to retrieve clothespins with mod verbal cueing and min/mod manual assistance. Pt requires frequent rest breaks due to impaired attention and fatigue. Pt asked if he would like to walk and pt provides "thumbs up". Pt ambulates x20'  with 3 musketeers technique and +3 assistance to promote trunk extension. Pt has poor attention to task, instead focusing on "name badge" on rehab tech, and perhaps with too much external distraction in gym. WC  transport back to room. Pt given choice of remaining in chair or going to bed and pt verbalizes "bed.". Stand pivot back to bed with maxA +2. Left supine with alarm intact and all needs within reach.  Therapy Documentation Precautions:  Precautions Precautions: Fall Precaution Booklet Issued: No Precaution Comments: PEG with binder Restrictions Weight Bearing Restrictions: No   Therapy/Group: Individual Therapy  Breck Coons 06/19/2022, 5:57 PM

## 2022-06-19 NOTE — Progress Notes (Signed)
PROGRESS NOTE   Subjective/Complaints:  Had another good night. Dad in room and absolutely excited that Jeshawn talked to him today! I watched him say most of a "good morning" to his mom on the phone.   ROS: Limited due to cognitive/behavioral   Objective:   No results found. No results for input(s): "WBC", "HGB", "HCT", "PLT" in the last 72 hours.      No results for input(s): "NA", "K", "CL", "CO2", "GLUCOSE", "BUN", "CREATININE", "CALCIUM" in the last 72 hours.       Intake/Output Summary (Last 24 hours) at 06/19/2022 1118 Last data filed at 06/19/2022 0700 Gross per 24 hour  Intake --  Output 650 ml  Net -650 ml              Physical Exam: Vital Signs Blood pressure 122/79, pulse 81, temperature (!) 97.1 F (36.2 C), temperature source Axillary, resp. rate 15, height 6\' 5"  (1.956 m), weight 104 kg, SpO2 96 %.   Constitutional: No distress . Vital signs reviewed. HEENT: NCAT, EOMI, oral membranes moist Neck: supple Cardiovascular: RRR without murmur. No JVD    Respiratory/Chest: CTA Bilaterally without wheezes or rales. Normal effort    GI/Abdomen: BS +, non-tender, non-distended Ext: no clubbing, cyanosis, or edema Psych: flat but engaging today. Seems less restless and agitated Skin: No evidence of breakdown, no evidence of rash Neurologic: making eye contact and engaging. More facial expressions. Voicing words now. Tended to right side with verbal cueing today.  Extends LLE 5/5 to command . 4/5 grossly on Right with cueing. Mild flex/ext tone right UE/LE   Musculoskeletal: no pain with active assisted ROM in UE and LE   Assessment/Plan: 1. Functional deficits which require 3+ hours per day of interdisciplinary therapy in a comprehensive inpatient rehab setting. Physiatrist is providing close team supervision and 24 hour management of active medical problems listed below. Physiatrist and rehab  team continue to assess barriers to discharge/monitor patient progress toward functional and medical goals  Care Tool:  Bathing    Body parts bathed by patient: Chest, Face, Front perineal area   Body parts bathed by helper: Right arm, Left arm, Abdomen, Right upper leg, Left upper leg, Buttocks, Left lower leg, Right lower leg     Bathing assist Assist Level: 2 Helpers     Upper Body Dressing/Undressing Upper body dressing   What is the patient wearing?: Pull over shirt    Upper body assist Assist Level: Total Assistance - Patient < 25%    Lower Body Dressing/Undressing Lower body dressing      What is the patient wearing?: Pants, Underwear/pull up     Lower body assist Assist for lower body dressing: 2 Helpers     Toileting Toileting    Toileting assist Assist for toileting: 2 Helpers     Transfers Chair/bed transfer  Transfers assist  Chair/bed transfer activity did not occur: Safety/medical concerns  Chair/bed transfer assist level: 2 Helpers     Locomotion Ambulation   Ambulation assist   Ambulation activity did not occur: Safety/medical concerns          Walk 10 feet activity   Assist  Walk 10 feet  activity did not occur: Safety/medical concerns        Walk 50 feet activity   Assist Walk 50 feet with 2 turns activity did not occur: Safety/medical concerns         Walk 150 feet activity   Assist Walk 150 feet activity did not occur: Safety/medical concerns         Walk 10 feet on uneven surface  activity   Assist Walk 10 feet on uneven surfaces activity did not occur: Safety/medical concerns         Wheelchair     Assist Is the patient using a wheelchair?: Yes Type of Wheelchair: Manual    Wheelchair assist level: Dependent - Patient 0%      Wheelchair 50 feet with 2 turns activity    Assist        Assist Level: Dependent - Patient 0%   Wheelchair 150 feet activity     Assist       Assist Level: Dependent - Patient 0%   Blood pressure 122/79, pulse 81, temperature (!) 97.1 F (36.2 C), temperature source Axillary, resp. rate 15, height 6\' 5"  (1.956 m), weight 104 kg, SpO2 96 %.    Medical Problem List and Plan: 1. 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  DVT right posterior tibial and peroneal veins (03/22/22): Discussed with mother that repeat doppler shows no new clots and resolution of one previous clot.  -anticoagulation: continue Eliquis             -antiplatelet therapy: none 3. Pain: continueTylenol prn 4. Behavior/sleep: LCSW to evaluate and provide emotional support             -antipsychotic agents: none  -increasing propranolol for restlessness  -continue low dose seroquel at night for restlessness/agitation  -6/27: added celexa 10mg  qhs for depression/irritability  5. Impaired attention/cognition: This patient is not capable of making decisions on his own behalf.             -6/30 pt has rebounded nicely this week   -increase ritalin to 20mg  bid, maintain above changes 6. Dysphagia. S/p PEG 3/29.  --NPO -continue TF/boluses -Continue zinc oxide to protect skin surrounding PEG tube -5/30 replaced PEG under fluoro by IR 7. Fluids/Electrolytes/Nutrition: Routine Is and Os and follow-up chemistries             --continue tube feeds Jevity 1.5 boluses             --continue Prosource             -  gaining weight  Filed Weights   06/17/22 0500 06/18/22 0358 06/19/22 0500  Weight: 103.2 kg 104.1 kg 104 kg    9: C1 right transverse process fracture--he's out of collar 10: Left Occipital condyle fracture--out of collar 11: Acute  ventilator respiratory failure requiring trach and PEG 3/29              --decannulated , stoma closed            -resumed robinul and robitussin  via PEG    -recent cxr clear.    -tachycardia- much improved with robinul/schedule robitussin on board 12: Right ear laceration:  healing 14: Tongue laceration: monitor healing 15: Hypertension/tachycardia:        -   propranolol increased to 40mg  qid  06/19/22 improved     06/19/2022    5:00 AM 06/19/2022    4:26 AM 06/18/2022   10:07 PM  Vitals with BMI  Weight 229 lbs 4 oz    Systolic  122 134  Diastolic  79 90  Pulse  81 79    16. Hypoglycemic to 69: otherwise in 90s: will d/c ISS since not requiring   18. Iron deficiency:  conitnue daily iron supplement  19. Tremor,?clonus: continue propranolol 40mg  qid 20, Constipation: ?mild  -moving bowels--had 3 large bm's 6/29 22. Slightly Elevated Ammonia: resolved  -potentially d/t vpa     LOS: 42 days A FACE TO FACE EVALUATION WAS PERFORMED  06/19/2022, 11:18 AM

## 2022-06-20 DIAGNOSIS — R52 Pain, unspecified: Secondary | ICD-10-CM

## 2022-06-20 LAB — GLUCOSE, CAPILLARY
Glucose-Capillary: 106 mg/dL — ABNORMAL HIGH (ref 70–99)
Glucose-Capillary: 109 mg/dL — ABNORMAL HIGH (ref 70–99)
Glucose-Capillary: 116 mg/dL — ABNORMAL HIGH (ref 70–99)
Glucose-Capillary: 118 mg/dL — ABNORMAL HIGH (ref 70–99)
Glucose-Capillary: 134 mg/dL — ABNORMAL HIGH (ref 70–99)
Glucose-Capillary: 95 mg/dL (ref 70–99)

## 2022-06-20 NOTE — Progress Notes (Signed)
Physical Therapy TBI Note  Patient Details  Name: Carl Carpenter MRN: [id number] Date of Birth: July 26, 1986  Today's Date: 06/20/2022 PT Individual Time: 1240-1300 and 1240-1300 PT Individual Time Calculation (min): 20 min and 20 min  Short Term Goals: Week 7:  PT Short Term Goal 1 (Week 7): Pt will complete bed mobility with modA +1. PT Short Term Goal 2 (Week 7): Pt will complete bed to chair transfer with modA +1. PT Short Term Goal 3 (Week 7): Pt will ambulate 100' with modA +2 and LRAD.  Skilled Therapeutic Interventions/Progress Updates:     Session 1: Patient in bed with SO, [name], and LPN providing morning meds via PEG tube upon PT arrival. Patient awake, but drowsy during session, [name] reports that his med administration on night shift was performed later than usual, so he was up late last night. Patient initiated all mobility with increased time, focused on use of +1 skilled assist for progressing to reduced burden of care. Also, focused on the patient verbalizing need throughout session. Patient stated "morning" after PT provided an example of "good morning," he also stated "I want to lay down" and "I want to rest" when provided 2 options and then asked what he would want to. Provided positive reinforcement by allowing the patient to perform the selected option. Physical indicators agreed with patient's selections. Patient requires >5 sec and x2-3 cues to initiate verbalizations. Continues to have hypophonic and dysarthric speech.   Removed Primo suction catheter at beginning of session with adhesive remover for patient comfort. Changed incontinence brief due to soiling at beginning of session. Patient lifted his hips x2 with mod A to remove soiled brief with total A. Peri-care performed with total a bed level. Patient performed rolling R/L with mod A and increased time for initiation to the R and mod-max A with increased time to the L with use of bed rails. Provided max multi-modal  cues for bending his knees, turning his head, and reaching for the bed rails. Patient performed supine to/from sit with mod A with increased time and cues for sequencing, transitioned through L side-lying. Patient sat EOB >8 min with min-max a for sitting balance with intermittent retropulsion with movement of lower extremities. Patient lifted each foot with increased time and cues for threading lower extremities through shorts and donning socks and shoes with total A. Attempted standing x2 with patient actively pushing back and leaning onto his L elbow after the second attempt. Asked patient if he wanted to get in the chair or lay down and patient selected to lay down, see above. Doffed tennis shoes with total A.   Educated [name] and the patient on the importance of encouraging verbalization at this time and reducing the use of hand signals when able.   Patient in bed with [name] at bedside at end of session with breaks locked, bed alarm set, and all needs within reach. Ended session 20 min early due to patient fatigue/request. Plan to return this afternoon to make up remaining time.   Session 2: Patient in bed with [name] at bedside upon PT arrival to make up remaining 20 min of morning session. Patient alert and agreeable to PT session. Patient verbally denied pain during session. Patient requested to "get out of bed."  Focused session on functional mobility with +1 skilled assist for bed>chair transfer.  Patient incontinent of bladder at beginning of session, performed peri-care and changed incontinence brief performing rolling as above. Patient sat EOB with max A transitioning  through R side-lying due to his L hand holding onto the bed rail preventing him from pushing up. Patient sat EOB as pants and shoes were donned, as above. Patient performed a stand pivot transfer bed>TIS w/c with max A +1 and min A from [place] for boosting up. Performed scooting back with mod-max A x2 in TIS w/c, patient  with good timing with 1-2-3 count x1 and poor coordination on second attempt.   Patient in TIS w/c with [facility] and LPN in the room at end of session with breaks locked, seat belt secured, and all needs within reach.    Therapy Documentation Precautions:  Precautions Precautions: Fall Precaution Booklet Issued: No Precaution Comments: PEG with binder Restrictions Weight Bearing Restrictions: No Agitated Behavior Scale: TBI Observation Details Observation Environment: CIR Start of observation period - Date: 06/20/22 Start of observation period - Time: 0915 End of observation period - Date: 06/20/22 End of observation period - Time: 0955 Agitated Behavior Scale (DO NOT LEAVE BLANKS) Short attention span, easy distractibility, inability to concentrate: Present to a moderate degree Impulsive, impatient, low tolerance for pain or frustration: Absent Uncooperative, resistant to care, demanding: Present to a slight degree Violent and/or threatening violence toward people or property: Absent Explosive and/or unpredictable anger: Absent Rocking, rubbing, moaning, or other self-stimulating behavior: Absent Pulling at tubes, restraints, etc.: Absent Wandering from treatment areas: Absent Restlessness, pacing, excessive movement: Absent Repetitive behaviors, motor, and/or verbal: Present to a slight degree Rapid, loud, or excessive talking: Absent Sudden changes of mood: Absent Easily initiated or excessive crying and/or laughter: Absent Self-abusiveness, physical and/or verbal: Absent Agitated behavior scale total score: 18 Agitated Behavior Scale: TBI Observation Details Observation Environment: CIR Start of observation period - Date: 06/20/22 Start of observation period - Time: 0915 End of observation period - Date: 06/20/22 End of observation period - Time: 0955 Agitated Behavior Scale (DO NOT LEAVE BLANKS) Short attention span, easy distractibility, inability to concentrate:  Present to a moderate degree Impulsive, impatient, low tolerance for pain or frustration: Absent Uncooperative, resistant to care, demanding: Absent Violent and/or threatening violence toward people or property: Absent Explosive and/or unpredictable anger: Absent Rocking, rubbing, moaning, or other self-stimulating behavior: Absent Pulling at tubes, restraints, etc.: Absent Wandering from treatment areas: Absent Restlessness, pacing, excessive movement: Absent Repetitive behaviors, motor, and/or verbal: Present to a slight degree Rapid, loud, or excessive talking: Absent Sudden changes of mood: Absent Easily initiated or excessive crying and/or laughter: Absent Self-abusiveness, physical and/or verbal: Absent Agitated behavior scale total score: 17   Therapy/Group: Individual Therapy  Lyndee Herbst L Tijana Walder PT, DPT  06/20/2022, 5:00 PM

## 2022-06-20 NOTE — Progress Notes (Signed)
PROGRESS NOTE   Subjective/Complaints:  Per significant other, who's at bedside, had another good night- a lot more interactive and participating in therapy and slept well last night- does take a few minutes ot wake up, but that's "normal".  Also excess salivation doing better with robinul.    ROS: limited due to cognition/sedation  Objective:   No results found. No results for input(s): "WBC", "HGB", "HCT", "PLT" in the last 72 hours.      No results for input(s): "NA", "K", "CL", "CO2", "GLUCOSE", "BUN", "CREATININE", "CALCIUM" in the last 72 hours.       Intake/Output Summary (Last 24 hours) at 06/20/2022 1106 Last data filed at 06/20/2022 0420 Gross per 24 hour  Intake --  Output 600 ml  Net -600 ml              Physical Exam: Vital Signs Blood pressure 101/71, pulse 87, temperature 98.1 F (36.7 C), temperature source Axillary, resp. rate 19, height 6\' 5"  (1.956 m), weight 103.9 kg, SpO2 96 %.    General: sleeping initially- SO at bedside; woke, but slow to wake; NAD HENT: conjugate gaze; oropharynx moist CV: regular rate; no JVD Pulmonary: CTA B/L; no W/R/R- good air movement GI: soft, NT, ND, (+)BS Psychiatric:sleepy- but flat-  Neurological: sleepy- nodding head yes and no, but not speaking- just woke up Skin: No evidence of breakdown, no evidence of rash Neurologic: making eye contact and engaging. More facial expressions. Voicing words now. Tended to right side with verbal cueing today.  Extends LLE 5/5 to command . 4/5 grossly on Right with cueing. Mild flex/ext tone right UE/LE   Musculoskeletal: no pain with active assisted ROM in UE and LE   Assessment/Plan: 1. Functional deficits which require 3+ hours per day of interdisciplinary therapy in a comprehensive inpatient rehab setting. Physiatrist is providing close team supervision and 24 hour management of active medical problems listed  below. Physiatrist and rehab team continue to assess barriers to discharge/monitor patient progress toward functional and medical goals  Care Tool:  Bathing    Body parts bathed by patient: Chest, Face, Front perineal area   Body parts bathed by helper: Right arm, Left arm, Abdomen, Right upper leg, Left upper leg, Buttocks, Left lower leg, Right lower leg     Bathing assist Assist Level: 2 Helpers     Upper Body Dressing/Undressing Upper body dressing   What is the patient wearing?: Pull over shirt    Upper body assist Assist Level: Total Assistance - Patient < 25%    Lower Body Dressing/Undressing Lower body dressing      What is the patient wearing?: Pants, Underwear/pull up     Lower body assist Assist for lower body dressing: 2 Helpers     Toileting Toileting    Toileting assist Assist for toileting: 2 Helpers     Transfers Chair/bed transfer  Transfers assist  Chair/bed transfer activity did not occur: Safety/medical concerns  Chair/bed transfer assist level: 2 Helpers     Locomotion Ambulation   Ambulation assist   Ambulation activity did not occur: Safety/medical concerns          Walk 10 feet activity  Assist  Walk 10 feet activity did not occur: Safety/medical concerns        Walk 50 feet activity   Assist Walk 50 feet with 2 turns activity did not occur: Safety/medical concerns         Walk 150 feet activity   Assist Walk 150 feet activity did not occur: Safety/medical concerns         Walk 10 feet on uneven surface  activity   Assist Walk 10 feet on uneven surfaces activity did not occur: Safety/medical concerns         Wheelchair     Assist Is the patient using a wheelchair?: Yes Type of Wheelchair: Manual    Wheelchair assist level: Dependent - Patient 0%      Wheelchair 50 feet with 2 turns activity    Assist        Assist Level: Dependent - Patient 0%   Wheelchair 150 feet activity      Assist      Assist Level: Dependent - Patient 0%   Blood pressure 101/71, pulse 87, temperature 98.1 F (36.7 C), temperature source Axillary, resp. rate 19, height 6\' 5"  (1.956 m), weight 103.9 kg, SpO2 96 %.    Medical Problem List and Plan: 1. 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  DVT right posterior tibial and peroneal veins (03/22/22): Discussed with mother that repeat doppler shows no new clots and resolution of one previous clot.  -anticoagulation: continue Eliquis             -antiplatelet therapy: none 3. Pain: continueTylenol prn 4. Behavior/sleep: LCSW to evaluate and provide emotional support             -antipsychotic agents: none  -increasing propranolol for restlessness  -continue low dose seroquel at night for restlessness/agitation  -6/27: added celexa 10mg  qhs for depression/irritability  5. Impaired attention/cognition: This patient is not capable of making decisions on his own behalf.             -6/30 pt has rebounded nicely this week   -increase ritalin to 20mg  bid, maintain above changes  7/1- doing MUCH better per family- con't regimen 6. Dysphagia. S/p PEG 3/29.  --NPO -continue TF/boluses -Continue zinc oxide to protect skin surrounding PEG tube -6/30 replaced PEG under fluoro by IR 7. Fluids/Electrolytes/Nutrition: Routine Is and Os and follow-up chemistries             --continue tube feeds Jevity 1.5 boluses             --continue Prosource             -  gaining weight  7/1- weight stable- con't to monitor Filed Weights   06/18/22 0358 06/19/22 0500 06/20/22 0416  Weight:  104.1 kg 104 kg 103.9 kg    9: C1 right transverse process fracture--he's out of collar 10: Left Occipital condyle fracture--out of collar 11: Acute ventilator respiratory failure requiring trach and PEG 3/29              --  decannulated , stoma closed            -resumed robinul and robitussin via PEG    -recent cxr clear.    -tachycardia- much improved with robinul/schedule robitussin on board  7/1- no tachycardia today- con't to monitor 12: Right ear laceration:  healing 14: Tongue laceration: monitor healing 15: Hypertension/tachycardia:        -   propranolol increased to 40mg  qid  06/19/22 improved     06/20/2022    4:16 AM 06/19/2022    7:26 PM 06/19/2022    5:00 AM  Vitals with BMI  Weight 229 lbs 1 oz  229 lbs 4 oz  Systolic 101 132   Diastolic 71 81   Pulse 87 96     16. Hypoglycemic to 69: otherwise in 90s: will d/c ISS since not requiring   18. Iron deficiency:  conitnue daily iron supplement  19. Tremor,?clonus: continue propranolol 40mg  qid 20, Constipation: ?mild  -moving bowels--had 3 large bm's 6/29 22. Slightly Elevated Ammonia: resolved  -potentially d/t vpa     LOS: 43 days A FACE TO FACE EVALUATION WAS PERFORMED  Carl Carpenter 06/20/2022, 11:06 AM

## 2022-06-21 LAB — GLUCOSE, CAPILLARY
Glucose-Capillary: 104 mg/dL — ABNORMAL HIGH (ref 70–99)
Glucose-Capillary: 106 mg/dL — ABNORMAL HIGH (ref 70–99)
Glucose-Capillary: 106 mg/dL — ABNORMAL HIGH (ref 70–99)
Glucose-Capillary: 147 mg/dL — ABNORMAL HIGH (ref 70–99)
Glucose-Capillary: 94 mg/dL (ref 70–99)
Glucose-Capillary: 98 mg/dL (ref 70–99)

## 2022-06-21 NOTE — Progress Notes (Signed)
Physical Therapy Session Note  Patient Details  Name: Carl Carpenter MRN: [id number] Date of Birth: 1986-01-06  Today's Date: 06/21/2022 PT Individual Time: 1300-1345 PT Individual Time Calculation (min): 45 min   Short Term Goals: Week 7:  PT Short Term Goal 1 (Week 7): Pt will complete bed mobility with modA +1. PT Short Term Goal 2 (Week 7): Pt will complete bed to chair transfer with modA +1. PT Short Term Goal 3 (Week 7): Pt will ambulate 100' with modA +2 and LRAD.  Skilled Therapeutic Interventions/Progress Updates:     Patient in bed with his mother at bedside upon PT arrival. Patient awake and clearly verbalized "I want to go home" at beginning of session. [name] reports that he had stated this to her this morning and she had been discussing getting out of bed and walking as the way to work toward getting home today. Patient verbalized several short phases, often after being provided 2 options then repeating the one he wanted. Cued patient to use his words instead of hand signals or initiating movement at rest and allowed patient to use hand signals when performing a functional activity to reduce dual task challenge and manage frustration tolerance. Patient requires >5 sec and x2-3 cues to initiate verbalizations. Continues to have hypophonic and dysarthric speech, improves intermittently with cues for repetition and speaking loud.  Patient demonstrated increased frustration with mobility and fatigue throughout session. Patient expressed signs of some increased awareness of deficits, stating "it's so hard," "it's tiring," with activities, and appears to be limited by the amount of effort every task takes at this time. Patient tearful following gait trial due to poor performance. Provided education on present deficits and fatigue related to TBI. Provided patient and his mother with coping strategies, patient and his mother very receptive with patient stating "thank  you."  Therapeutic Activity: Bed mobility: Removed Primo suction catheter at beginning of session with adhesive remover for patient comfort. Changed incontinence brief due to bowl incontinence at beginning of session. Patient lifted his hips x2 with mod A to remove/replace soiled brief with total A. Peri-care performed with total a bed level. Patient performed rolling R/L with mod A with increased time to the L with use of bed rails. Provided max multi-modal cues for bending his knees, turning his head, and reaching for the bed rails. Patient performed supine to sit with mod A following x2 trials due to motor planning deficits with increased time and cues for sequencing, transitioned through R side-lying. Patient sat EOB >8 min with min-max a for sitting balance with intermittent retropulsion with movement of lower extremities. Patient lifted each foot with increased time and cues for threading lower extremities through pants and donning socks and shoes with total A.    Transfers and Gait trials: Patient performed a stand pivot transfer bed>TIS w/c with max A +2 using 3 Musketeer technique. He performed sit to-from stand with mod-max A +2 x2 with 3 Musketeer technique for x2 attempts at gait trials. Patient initiated gait trials of 3-4 steps x2 limited by poor motor planning and increased forward trunk lean with fatigue. Utilized leg straps for R limb advancement with max A. Patient unable to sustain upright standing and returned to sitting following both trials. Demonstrated use of Swedish Carley Hammed) walker and set patient up to stand. Patient declined gait trial with the walker due to fatigue.   Performed reciprocal B lower extremity w/c propulsion in TIS w/c x15 feet before patient requested a ride  when provided with 2 options due to limited participation after this distance.   Patient in TIS w/c handed off to his mother to ride into the Day Room for improved patient mood/affect at end of session with seat  belt secured, and RN aware.   Therapy Documentation Precautions:  Precautions Precautions: Fall Precaution Booklet Issued: No Precaution Comments: PEG with binder Restrictions Weight Bearing Restrictions: No Agitated Behavior Scale: TBI Observation Details Observation Environment: CIR Start of observation period - Date: 06/21/22 Start of observation period - Time: 1300 End of observation period - Date: 06/21/22 End of observation period - Time: 1345 Agitated Behavior Scale (DO NOT LEAVE BLANKS) Short attention span, easy distractibility, inability to concentrate: Present to a moderate degree Impulsive, impatient, low tolerance for pain or frustration: Present to a moderate degree Uncooperative, resistant to care, demanding: Absent Violent and/or threatening violence toward people or property: Absent Explosive and/or unpredictable anger: Absent Rocking, rubbing, moaning, or other self-stimulating behavior: Absent Pulling at tubes, restraints, etc.: Absent Wandering from treatment areas: Absent Restlessness, pacing, excessive movement: Absent Repetitive behaviors, motor, and/or verbal: Present to a slight degree Rapid, loud, or excessive talking: Absent Sudden changes of mood: Absent Easily initiated or excessive crying and/or laughter: Absent Self-abusiveness, physical and/or verbal: Absent Agitated behavior scale total score: 20   Therapy/Group: Individual Therapy  [name] PT, DPT  06/21/2022, 7:48 PM

## 2022-06-21 NOTE — Progress Notes (Addendum)
Occupational Therapy Session Note  Patient Details  Name: Carl Carpenter MRN: [id number] Date of Birth: [birth year]  Today's Date: 06/21/2022 OT Individual Time: 0906-1006 OT Individual Time Calculation (min): 60 min    Short Term Goals: Week 1:  OT Short Term Goal 1 (Week 1): Patient will attend to stimuli presented in central vision for 1 min indicting improved arousal/attention. OT Short Term Goal 1 - Progress (Week 1): Not met OT Short Term Goal 2 (Week 1): Patient will maintain static sitting balance at EOB with Mod A for >5 min in prep for ADLs. OT Short Term Goal 2 - Progress (Week 1): Not met OT Short Term Goal 3 (Week 1): Patient will complete sit to stand transfer with Max A +2 in prep for ADLs. OT Short Term Goal 3 - Progress (Week 1): Not met OT Short Term Goal 4 (Week 1): Patient will follow 1-step verbal commands with 25% accuracy during 3 consecutive treatment sessions in prep for ADLs. OT Short Term Goal 4 - Progress (Week 1): Progressing toward goal Week 2:  OT Short Term Goal 1 (Week 2): Pt will maintain arousal for 2 consecutive OT sessions OT Short Term Goal 1 - Progress (Week 2): Met OT Short Term Goal 2 (Week 2): Pt will initiate 25% of commands during ADLs with max cueing OT Short Term Goal 2 - Progress (Week 2): Progressing toward goal OT Short Term Goal 3 (Week 2): Pt will complete 1 simple ADL task with max A OT Short Term Goal 3 - Progress (Week 2): Progressing toward goal OT Short Term Goal 4 (Week 2): Pt will maintain head control EOB with min A OT Short Term Goal 4 - Progress (Week 2): Met  Skilled Therapeutic Interventions/Progress Updates:    Patient in bed with family present at the time of treatment., pt transferred from supine to EOB with initial assistance of his family member. The was positioned in static sit using pillows tto aid with static/dynamic sit balance, requiring MaxA for maintaining position.   pt completed  PROM of the scapular for shld  hikes,  adduction of the shld,  elbow flexion, supination and pronation , followed by bilateral ulnar and radial deviation to improve muscle and joint communication .  The pt was tilted on bilateral sides to weightbear through his UE to improve muscle and joint performance for decreasing tone.  The pt was instructed on visualizing movement prior to verbal command and then was asked to attempt to execute the movement while using his vision acuity to focus on the movement.  The pt was able to demonstrate effective carryover for follow verbal commands for grasping my hand and squeezing it 6 out of 10 attempts.  The pt was returned to supine in bed with the assistance of the  Rehab Tech with MaxX2 for positioning. The pt was positioned supine in bed with pillows in place and his family present.  All additional needs were addressed prior to exiting the room.   Therapy Documentation Precautions:  Precautions Precautions: Fall Precaution Booklet Issued: No Precaution Comments: PEG with binder Restrictions Weight Bearing Restrictions: No  Therapy/Group: Individual Therapy  Yvonne Kendall 06/21/2022, 4:37 PM

## 2022-06-21 NOTE — Progress Notes (Signed)
PROGRESS NOTE   Subjective/Complaints:  Per mother, pt awake since 6:30 am And more talkative-  LBM 6/29- at night- denies constipation.   Sat EOB on his own- a little wobbly, but mother said she "made him" do on his own.  Pees a lot per mother.    ROS: limited due to cognition  Objective:   No results found. No results for input(s): "WBC", "HGB", "HCT", "PLT" in the last 72 hours.      No results for input(s): "NA", "K", "CL", "CO2", "GLUCOSE", "BUN", "CREATININE", "CALCIUM" in the last 72 hours.       Intake/Output Summary (Last 24 hours) at 06/21/2022 0910 Last data filed at 06/21/2022 0502 Gross per 24 hour  Intake --  Output 500 ml  Net -500 ml              Physical Exam: Vital Signs Blood pressure 124/82, pulse 95, temperature 98.9 F (37.2 C), temperature source Oral, resp. rate 20, height 6\' 5"  (1.956 m), weight 104.8 kg, SpO2 96 %.    General: awake, alert, calm; not speaking this AM; sitting up in bed; mother at bedside; NAD HENT: conjugate gaze; oropharynx moist CV: regular rate; no JVD Pulmonary: CTA B/L; no W/R/R- good air movement GI: soft, NT, ND, (+)BS Psychiatric: appropriate/calm- not real interactive, but did give some signs Neurological: alert- gave thumbs up about how doing Skin: No evidence of breakdown, no evidence of rash Neurologic: making eye contact and engaging. More facial expressions. Voicing words now. Tended to right side with verbal cueing today.  Extends LLE 5/5 to command . 4/5 grossly on Right with cueing. Mild flex/ext tone right UE/LE   Musculoskeletal: no pain with active assisted ROM in UE and LE   Assessment/Plan: 1. Functional deficits which require 3+ hours per day of interdisciplinary therapy in a comprehensive inpatient rehab setting. Physiatrist is providing close team supervision and 24 hour management of active medical problems listed  below. Physiatrist and rehab team continue to assess barriers to discharge/monitor patient progress toward functional and medical goals  Care Tool:  Bathing    Body parts bathed by patient: Chest, Face, Front perineal area   Body parts bathed by helper: Right arm, Left arm, Abdomen, Right upper leg, Left upper leg, Buttocks, Left lower leg, Right lower leg     Bathing assist Assist Level: 2 Helpers     Upper Body Dressing/Undressing Upper body dressing   What is the patient wearing?: Pull over shirt    Upper body assist Assist Level: Total Assistance - Patient < 25%    Lower Body Dressing/Undressing Lower body dressing      What is the patient wearing?: Pants, Underwear/pull up     Lower body assist Assist for lower body dressing: 2 Helpers     Toileting Toileting    Toileting assist Assist for toileting: 2 Helpers     Transfers Chair/bed transfer  Transfers assist  Chair/bed transfer activity did not occur: Safety/medical concerns  Chair/bed transfer assist level: 2 Helpers     Locomotion Ambulation   Ambulation assist   Ambulation activity did not occur: Safety/medical concerns  Walk 10 feet activity   Assist  Walk 10 feet activity did not occur: Safety/medical concerns        Walk 50 feet activity   Assist Walk 50 feet with 2 turns activity did not occur: Safety/medical concerns         Walk 150 feet activity   Assist Walk 150 feet activity did not occur: Safety/medical concerns         Walk 10 feet on uneven surface  activity   Assist Walk 10 feet on uneven surfaces activity did not occur: Safety/medical concerns         Wheelchair     Assist Is the patient using a wheelchair?: Yes Type of Wheelchair: Manual    Wheelchair assist level: Dependent - Patient 0%      Wheelchair 50 feet with 2 turns activity    Assist        Assist Level: Dependent - Patient 0%   Wheelchair 150 feet activity      Assist      Assist Level: Dependent - Patient 0%   Blood pressure 124/82, pulse 95, temperature 98.9 F (37.2 C), temperature source Oral, resp. rate 20, height 6\' 5"  (1.956 m), weight 104.8 kg, SpO2 96 %.    Medical Problem List and Plan: 1. 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  DVT right posterior tibial and peroneal veins (03/22/22): Discussed with mother that repeat doppler shows no new clots and resolution of one previous clot.  -anticoagulation: continue Eliquis             -antiplatelet therapy: none 3. Pain: continueTylenol prn 4. Behavior/sleep: LCSW to evaluate and provide emotional support             -antipsychotic agents: none  -increasing propranolol for restlessness  -continue low dose seroquel at night for restlessness/agitation  -6/27: added celexa 10mg  qhs for depression/irritability  5. Impaired attention/cognition: This patient is not capable of making decisions on his own behalf.             -6/30 pt has rebounded nicely this week   -increase ritalin to 20mg  bid, maintain above changes  7/2- much more awake per mother- con't regimen 6. Dysphagia. S/p PEG 3/29.  --NPO -continue TF/boluses -Continue zinc oxide to protect skin surrounding PEG tube -5/30 replaced PEG under fluoro by IR 7. Fluids/Electrolytes/Nutrition: Routine Is and Os and follow-up chemistries             --continue tube feeds Jevity 1.5 boluses             --continue Prosource             -  gaining weight  7/1- weight stable- con't to monitor Filed Weights   06/19/22 0500 06/20/22 0416  06/21/22 0500  Weight: 104 kg 103.9 kg 104.8 kg    9: C1 right transverse process fracture--he's out of collar 10: Left Occipital condyle fracture--out of collar 11:  Acute ventilator respiratory failure requiring trach and PEG 3/29              --decannulated , stoma closed            -resumed robinul and robitussin via PEG    -recent cxr clear.    -tachycardia- much improved with robinul/schedule robitussin on board  7/2- HR running in 90s-con't regimen 12: Right ear laceration:  healing 14: Tongue laceration: monitor healing 15: Hypertension/tachycardia:        -   propranolol increased to 40mg  qid  06/19/22 improved     06/21/2022    8:01 AM 06/21/2022    5:00 AM 06/21/2022    4:11 AM  Vitals with BMI  Weight  231 lbs 1 oz   Systolic 124  114  Diastolic 82  75  Pulse 95  94    16. Hypoglycemic to 69: otherwise in 90s: will d/c ISS since not requiring   18. Iron deficiency:  conitnue daily iron supplement  19. Tremor,?clonus: continue propranolol 40mg  qid 20, Constipation: ?mild  -moving bowels--had 3 large bm's 6/29 22. Slightly Elevated Ammonia: resolved  -potentially d/t vpa     LOS: 44 days A FACE TO FACE EVALUATION WAS PERFORMED  Sam Wunschel 06/21/2022, 9:10 AM

## 2022-06-22 LAB — COMPREHENSIVE METABOLIC PANEL
ALT: 23 U/L (ref 0–44)
AST: 16 U/L (ref 15–41)
Albumin: 3.8 g/dL (ref 3.5–5.0)
Alkaline Phosphatase: 74 U/L (ref 38–126)
Anion gap: 6 (ref 5–15)
BUN: 14 mg/dL (ref 6–20)
CO2: 28 mmol/L (ref 22–32)
Calcium: 9.5 mg/dL (ref 8.9–10.3)
Chloride: 103 mmol/L (ref 98–111)
Creatinine, Ser: 0.63 mg/dL (ref 0.61–1.24)
GFR, Estimated: >60 mL/min (ref >60)
Glucose, Bld: 95 mg/dL (ref 70–99)
Potassium: 3.8 mmol/L (ref 3.5–5.1)
Sodium: 137 mmol/L (ref 135–145)
Total Bilirubin: 0.5 mg/dL (ref 0.3–1.2)
Total Protein: 7.1 g/dL (ref 6.5–8.1)

## 2022-06-22 LAB — GLUCOSE, CAPILLARY
Glucose-Capillary: 109 mg/dL — ABNORMAL HIGH (ref 70–99)
Glucose-Capillary: 115 mg/dL — ABNORMAL HIGH (ref 70–99)
Glucose-Capillary: 117 mg/dL — ABNORMAL HIGH (ref 70–99)
Glucose-Capillary: 134 mg/dL — ABNORMAL HIGH (ref 70–99)
Glucose-Capillary: 99 mg/dL (ref 70–99)

## 2022-06-22 LAB — CBC
HCT: 37.1 % — ABNORMAL LOW (ref 39.0–52.0)
Hemoglobin: 12.9 g/dL — ABNORMAL LOW (ref 13.0–17.0)
MCH: 31.6 pg (ref 26.0–34.0)
MCHC: 34.8 g/dL (ref 30.0–36.0)
MCV: 90.9 fL (ref 80.0–100.0)
Platelets: 190 10*3/uL (ref 150–400)
RBC: 4.08 MIL/uL — ABNORMAL LOW (ref 4.22–5.81)
RDW: 12 % (ref 11.5–15.5)
WBC: 12 10*3/uL — ABNORMAL HIGH (ref 4.0–10.5)
nRBC: 0 % (ref 0.0–0.2)

## 2022-06-22 MED ORDER — FREE WATER
100.0000 mL | Freq: Four times a day (QID) | Status: DC
Start: 2022-06-22 — End: 2022-06-25
  Administered 2022-06-22 – 2022-06-25 (×12): 100 mL

## 2022-06-22 NOTE — Progress Notes (Signed)
Occupational Therapy TBI Note  Patient Details  Name: Carl Carpenter MRN: [id number] Date of Birth: 05-03-1986  Today's Date: 06/22/2022 OT Individual Time: 1103-1205 OT Individual Time Calculation (min): 62 min    Short Term Goals: Week 6:  OT Short Term Goal 1 (Week 6): Pt will initiate 25% of all commands during 1 simple ADL task OT Short Term Goal 2 (Week 6): Pt will complete stedy transfer with no more than mod +2 OT Short Term Goal 3 (Week 6): Pt will maintain sustained attention to simple ADL task for 30 sec  Skilled Therapeutic Interventions/Progress Updates:    Patient received supine in bed, sleeping.  Patient difficult to arouse initially, but with movement - able to maintain eyes open.  Patient sustained eye contact x several seconds and verbalized "Morning" in response to greeting.  Patient's father present upon arrival, and assisted with set up of ADL.  Patient needed total assistance to transition to sit at edge of bed. Patient with inability to sit without support initially.  Patient with significant loss of balance backward and to left while seated.  When assisted patient to maintain feet on floor, had moments of static sitting balance, also when engaged in familiar functional task - noted brief moments of postural control.  Patient with very forward head position which limits sitting balance, and ability to use vision to engage in activity.  Patient able to choose shirt from choice of two.  Patient able to follow one step verbal directives related to familiar functional activities.  Patient able to partially stand in attempt to pull up shorts.  Noted patient's brief slightly soiled, so assisted him back to supine for perianal hygiene and to change brief.  Patient with large BM requiring multiple rolls left/right.  Patient assisted with bridging to aide with task.  Patient then assisted back up to sitting to transfer (squat pivot) back into wheelchair with max assist.  Second  caregiver available and assisted with second half of transfer.  Patient left up in wheelchair- slightly reclined with seatbelt in place.  Father back in room.    Therapy Documentation Precautions:  Precautions Precautions: Fall Precaution Booklet Issued: No Precaution Comments: PEG with binder Restrictions Weight Bearing Restrictions: No   Pain:  No indication of pain  Agitated Behavior Scale: TBI   Agitated Behavior Scale (DO NOT LEAVE BLANKS) Short attention span, easy distractibility, inability to concentrate: Present to a slight degree Impulsive, impatient, low tolerance for pain or frustration: Present to a moderate degree Uncooperative, resistant to care, demanding: Absent Violent and/or threatening violence toward people or property: Absent Explosive and/or unpredictable anger: Absent Rocking, rubbing, moaning, or other self-stimulating behavior: Absent Pulling at tubes, restraints, etc.: Absent Wandering from treatment areas: Absent Restlessness, pacing, excessive movement: Absent Repetitive behaviors, motor, and/or verbal: Absent Rapid, loud, or excessive talking: Absent Sudden changes of mood: Absent Easily initiated or excessive crying and/or laughter: Absent Self-abusiveness, physical and/or verbal: Absent Agitated behavior scale total score: 17       Therapy/Group: Individual Therapy  Collier Salina 06/22/2022, 6:30 PM

## 2022-06-22 NOTE — Progress Notes (Signed)
Physical Therapy TBI Note  Patient Details  Name: Carl Carpenter MRN: [id number] Date of Birth: 1986-02-24  Today's Date: 06/23/2022 PT Individual Time: 1300-1400 PT Individual Time Calculation (min): 60 min  Short Term Goals: Week 7:  PT Short Term Goal 1 (Week 7): Pt will complete bed mobility with modA +1. PT Short Term Goal 2 (Week 7): Pt will complete bed to chair transfer with modA +1. PT Short Term Goal 3 (Week 7): Pt will ambulate 100' with modA +2 and LRAD.  Skilled Therapeutic Interventions/Progress Updates:     Patient in bed asleep with his father and LPN in the room upon PT arrival. Patient required increased time and stimulation to arouse and engage with PT this session. LPN provided medications via PEG tube at beginning of session as PT discussed d/c planning and equipment with patient's father. He reports the family is still planning on purchasing a Stedy for reduced caregiver burden with transfers and ADLs at d/c.   Patient did not display any outward signs of pain throughout session.   Patient verbalized several short phases, often after being provided 2 options then repeating the one he wanted. Cued patient to use his words instead of hand signals or initiating movement at rest and allowed patient to use hand signals when performing a functional activity to reduce dual task challenge and manage frustration tolerance. Patient requires >5 sec and x2-3 cues to initiate verbalizations. Continues to have hypophonic and dysarthric speech, improves intermittently with cues for repetition and speaking loud.  Patient initially reported he wanted to stay in bed. When ask why, patient stated, "it's hard on you." PT educated on benefits of OOB mobility, use of +2 to reduce burden of care and use of Stedy to reduce burden of care with increased patient independence with standing with this device. Patient then agreeable to to transfer to the TIS w/c.   Once in the w/c offered the patient  3 options for functional tasks, patient selected working on upper extremity tasks seated in the TIS w/c due to fatigue.   Patient sat in the main gym at the elevated table with while partition around him to minimize visual stimulation/distractions. Patient identified 1/3 correct colors of colored pegs, reported "I don't know" with second and third pegs and did not select a color when 2 options were provided. Patient able to place pegs on peg board x2 using his L hand with set-up assist. Patient then began pushing the board away and trying to push his w/c back with his L hand. Redirected behavior and requested patient use his words to communicate his needs. Patient stopped pushing, but unable to verbalize needs due to decreased oral motor planning. Selected to return to the patient's room for a break and patient was demonstrating signs of over stimulation in the gym environment.   Therapeutic Activity: Bed Mobility: Patient performed supine to sit with mod A +2 with increased time for initiation. Threaded lower extremities through pants with max A with mod-max A for trunk control in sitting.  Transfers: Patient performed a dependent transfer bed>TIS w/c with PT and his father assisting with rehab tech providing SBA for safety. Patient performed sit to/from stand mod-min A of 1-2 in the [place] x2. Provided verbal cues for initiation and trunk extension. Patient gave his father a hug in standing in the [place] with fully erect posture.   Patient self selected to sit up in the TIS w/c by patient selecting from options of bed or chair, patient  stated, "chair, duh!" Patient in TIS w/c with his father and SO, [name], in the room at end of session with breaks locked and seat belt secured.  Therapy Documentation Precautions:  Precautions Precautions: Fall Precaution Booklet Issued: No Precaution Comments: PEG with binder Restrictions Weight Bearing Restrictions: No  Therapy/Group: Individual  Therapy  Jaeline Whobrey L Briannie Gutierrez PT, DPT, NCS, CBIS  06/22/2022, 6:45 PM

## 2022-06-22 NOTE — Progress Notes (Addendum)
PROGRESS NOTE   Subjective/Complaints:  Pt up and alert. With dad. Said "good morning" to me!  ROS: Limited due to cognitive/behavioral   Objective:   No results found. Recent Labs    06/22/22 0736  WBC 12.0*  HGB 12.9*  HCT 37.1*  PLT 190        Recent Labs    06/22/22 0736  NA 137  K 3.8  CL 103  CO2 28  GLUCOSE 95  BUN 14  CREATININE 0.63  CALCIUM 9.5         Intake/Output Summary (Last 24 hours) at 06/22/2022 1129 Last data filed at 06/22/2022 0700 Gross per 24 hour  Intake 0 ml  Output 700 ml  Net -700 ml              Physical Exam: Vital Signs Blood pressure 101/62, pulse 79, temperature 98.4 F (36.9 C), temperature source Oral, resp. rate 20, height 6\' 5"  (1.956 m), weight 106.9 kg, SpO2 96 %.  Constitutional: No distress . Vital signs reviewed. HEENT: NCAT, EOMI, oral membranes moist Neck: supple Cardiovascular: RRR without murmur. No JVD    Respiratory/Chest: CTA Bilaterally without wheezes or rales. Normal effort    GI/Abdomen: BS +, non-tender, non-distended Ext: no clubbing, cyanosis, or edema Psych: pleasant and cooperative  Skin: No evidence of breakdown, no evidence of rash Neurologic: making eye contact and engaging. Attempts to converse. Speech dysarthric and hypophonic but intelligible.  Tended to right side with verbal cueing today.  Extends LLE 5/5 to command . 4/5 grossly on Right with cueing. Mild flex/ext tone right UE/LE which is easily broken with PROM Musculoskeletal: no jt pain   Assessment/Plan: 1. Functional deficits which require 3+ hours per day of interdisciplinary therapy in a comprehensive inpatient rehab setting. Physiatrist is providing close team supervision and 24 hour management of active medical problems listed below. Physiatrist and rehab team continue to assess barriers to discharge/monitor patient progress toward functional and medical  goals  Care Tool:  Bathing    Body parts bathed by patient: Chest, Face, Front perineal area   Body parts bathed by helper: Right arm, Left arm, Abdomen, Right upper leg, Left upper leg, Buttocks, Left lower leg, Right lower leg     Bathing assist Assist Level: 2 Helpers     Upper Body Dressing/Undressing Upper body dressing   What is the patient wearing?: Pull over shirt    Upper body assist Assist Level: Total Assistance - Patient < 25%    Lower Body Dressing/Undressing Lower body dressing      What is the patient wearing?: Pants, Underwear/pull up     Lower body assist Assist for lower body dressing: 2 Helpers     Toileting Toileting    Toileting assist Assist for toileting: 2 Helpers     Transfers Chair/bed transfer  Transfers assist  Chair/bed transfer activity did not occur: Safety/medical concerns  Chair/bed transfer assist level: 2 Helpers     Locomotion Ambulation   Ambulation assist   Ambulation activity did not occur: Safety/medical concerns          Walk 10 feet activity   Assist  Walk 10 feet activity did  not occur: Safety/medical concerns        Walk 50 feet activity   Assist Walk 50 feet with 2 turns activity did not occur: Safety/medical concerns         Walk 150 feet activity   Assist Walk 150 feet activity did not occur: Safety/medical concerns         Walk 10 feet on uneven surface  activity   Assist Walk 10 feet on uneven surfaces activity did not occur: Safety/medical concerns         Wheelchair     Assist Is the patient using a wheelchair?: Yes Type of Wheelchair: Manual    Wheelchair assist level: Dependent - Patient 0%      Wheelchair 50 feet with 2 turns activity    Assist        Assist Level: Dependent - Patient 0%   Wheelchair 150 feet activity     Assist      Assist Level: Dependent - Patient 0%   Blood pressure 101/62, pulse 79, temperature 98.4 F (36.9 C),  temperature source Oral, resp. rate 20, height 6\' 5"  (1.956 m), weight 106.9 kg, SpO2 96 %.    Medical Problem List and Plan: 1. 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  DVT right posterior tibial and peroneal veins (03/22/22): Discussed with mother that repeat doppler shows no new clots and resolution of one previous clot.  -anticoagulation: continue Eliquis             -antiplatelet therapy: none 3. Pain: continueTylenol prn 4. Behavior/sleep: LCSW to evaluate and provide emotional support             -antipsychotic agents: none  -increasing propranolol for restlessness  -continue low dose seroquel at night for restlessness/agitation  --continue celexa 10mg  qhs for depression/irritability  5. Impaired attention/cognition: This patient is not capable of making decisions on his own behalf.             7/3 continued improvement   -keep ritalin at 20mg  bid, maintain above changes    -will hold on sinemet for now 6. Dysphagia. S/p PEG 3/29.  --NPO -continue TF/boluses -Continue zinc oxide to protect skin surrounding PEG tube -5/30 replaced PEG under fluoro by IR 7. Fluids/Electrolytes/Nutrition: Routine Is and Os and follow-up chemistries             --continue tube feeds Jevity 1.5 boluses             --continue Prosource             -  gaining weight  7/3- weights stable- con't to monitor Filed Weights   06/20/22 0416 06/21/22 0500 06/22/22 0700  Weight: 103.9 kg 104.8 kg 106.9 kg    9: C1 right transverse process fracture--he's out of collar 10: Left Occipital condyle fracture--out of collar 11: Acute ventilator respiratory failure requiring trach and PEG 3/29              --decannulated , stoma  closed            -resumed robinul and  robitussin via PEG    -recent cxr clear.    -tachycardia- much improved with robinul/schedule robitussin on board  7/3- HR running in 90s-con't regimen 12: Right ear laceration:  healing 14: Tongue laceration: monitor healing 15: Hypertension/tachycardia:        -   propranolol increased to 40mg  qid   improved     06/22/2022    7:00 AM 06/22/2022    4:10 AM 06/21/2022    8:05 PM  Vitals with BMI  Weight 235 lbs 11 oz    Systolic  101 128  Diastolic  62 82  Pulse  79 92    16. Hypoglycemic to 69: otherwise in 90s: will d/c ISS since not requiring   18. Iron deficiency:  conitnue daily iron supplement  19. Tremor,?clonus: continue propranolol 40mg  qid 20, Constipation: ?mild  -moving bowels--+BM 7/3 22. Slightly Elevated Ammonia: resolved  -potentially d/t vpa 23. Leukocytosis: 12k. Recheck tomorrow     LOS: 45 days A FACE TO FACE EVALUATION WAS PERFORMED  08/22/2022 06/22/2022, 11:29 AM

## 2022-06-22 NOTE — Progress Notes (Signed)
Nutrition Follow-up  DOCUMENTATION CODES:   Non-severe (moderate) malnutrition in context of acute illness/injury  INTERVENTION:   Continue Tube feed via PEG:  2 containers of Jevity 1.5 - QID 45 mL ProSource TF - QID 50 mL free water before and after each feeding  Add additional 100 mL free water q6h Provides: 3000 kcal, 164 grams protein, and 1440 mL free water (2240 mL total free water).   NUTRITION DIAGNOSIS:   Moderate Malnutrition related to acute illness as evidenced by moderate fat depletion, moderate muscle depletion. - Bring addressed via TF  GOAL:   Patient will meet greater than or equal to 90% of their needs - Being met via TF  MONITOR:   TF tolerance, Weight trends, I & O's  REASON FOR ASSESSMENT:   New TF    ASSESSMENT:   Pt with recent hospitalization 3/21 - 5/19 for severe TBI after [facility].  Visited with pt and pt significant other. Reports TF going well, although suspect that pt is constipated. Significant other with no questions regarding administering TF via PEG.   Discussed case with RN outside of room. Pt continues to tolerate TF at this time. Pt with a bit of constipation, but plan to give meds today.  Pt weight continues to remain stable throughout admission.   RD to add additional free water to TF regimen.   Medications reviewed and include: Ferrous Sulfate, Senokot Labs reviewed.   UOP: 700 mL x 24 hrs  Diet Order:   Diet Order             Diet NPO time specified  Diet effective now                   EDUCATION NEEDS:   Not appropriate for education at this time  Skin:  Skin Assessment: Reviewed RN Assessment  Last BM:  7/2 - Type 6 (smear)  Height:  Ht Readings from Last 1 Encounters:  05/11/22 6' 5" (1.956 m)   Weight:  Wt Readings from Last 1 Encounters:  06/22/22 106.9 kg   BMI:  Body mass index is 27.95 kg/m.  Estimated Nutritional Needs:  Kcal:  2800-3000 Protein:  150-170 grams Fluid:  >2.8  L/day   Hermina Barters RD, LDN Clinical Dietitian See [facility] for contact information.

## 2022-06-22 NOTE — Progress Notes (Addendum)
Speech Language Pathology TBI Note  Patient Details  Name: Carl Carpenter MRN: [id number] Date of Birth: [birth year]  Today's Date: 06/22/2022 SLP Individual Time: 0998-3382 SLP Individual Time Calculation (min): 45 min  Short Term Goals: Week 7: SLP Short Term Goal 1 (Week 7): Pt will consume therapeutic trials of 1/2 teaspoons of thin liquids and ice chips with minimal overt s/s of aspiration over 2 consecutive sessions and max assist use of swallowing precautions prior to MBS or FEES. - Following Max to Total A for oral care completion, inconsistent swallow trigger noted upon digital palpation on 3 out of 4 trials of 1/2 tsp of thin water via siphoned straw. Delayed cough noted x 2. Max A multimodal cues not beneficial in improving swallow initiation (tactile + verbal cues, hyoglossal assistance, ice cold water, placement of straw in L buccal cavity). Oral suctioning via [place] provided.  SLP Short Term Goal 2 (Week 7): Pt will verbalize on command in 25% of opportunities with max assist multimodal when given chocies from a field of 2. - Pt verbalized, single words, on command in ~80% of trials given Mod A when provided with binary verbal choice prompts for communication of wants, needs, and thoughts. Benefited from extended wait time and 2-3 verbal repetitions of choices to respond.  SLP Short Term Goal 3 (Week 7): Pt will answer basic yes/no questions using via gestures or verbalizations in 50% of opportunities with mod assist multimodal cues over 2 sessions. - Answered basic + personal yes/no questions via verbal means on <25% of trials despite Max A multimodal cues. When given low-tech AAC/yes + no board + Mod A multinodal cues, pt answered yes/no questions on ~75% of trials.  SLP Short Term Goal 4 (Week 7): Pt will demonstrate sustained attention on a targeted stimulus for 2 minutes with max assist multimodal cues over 2 sessions. - Pt demonstrated sustained attention to targeted stimulus  during naming task for 2 minutes with Min-Mod A.   SLP Short Term Goal 5 (Week 7): Pt will follow 1 step  commands in 75% of opportunities with mod assist multimodal cues over 2 sessions. - Followed one-step commands in ~80% of opportunities during verbalization tasks, orofacial movement, and LUE movement given Mod A multimodal cues.  SLP Short Term Goal 6 (Week 7): Patient will initiate functional tasks with Mod A multimodal cues. - Pt initiated familiar + functional tasks re: brushing teeth and wiping mouth with Min A. Verbal tasks with Mod A.   Skilled Therapeutic Interventions: S: Pt seen this date for skilled ST intervention targeting deglutition and cognitive-linguistic goals outlined above. Pt received awake/alert and lying in bed. Father present and positioned on R side to encourage attention to R field. Pt with L head preference; however, when SLP greeted pt, he turned head to R to acknowledge therapist. Per chart review, pt with increased verbalizations this weekend. Participatory in therapy at bedside.  O: Please see above for objective data re: pt's performance on targeted goals. Pt noted with improved verbalizations this date when given binary verbal choice prompts, personal close-ended questions, extended processing time, and 2-3 verbal repetitions of choice or question prompt. Pt successfully named ~50% of objects in room with Mod A and no choice prompts; ~80% with choice prompts and semantic cues. Followed commands more readily given only Mod A vs Max A multimodal cues. Verbal yes/no responses remain difficult for pt; therefore, benefits from use of low-tech AAC board. Beginning to encourage and reinforce verbal vs hand gesture responses  given improvement in initiation of verbal output. Speech remains dysarthric, though intelligibility noted to improve with verbal prompts to increase vocal intensity. On probe, within a known context, pt's intelligibility was noted to be ~40%, though improved  to 70% with verbal cue. Outside of context, pt's speech was noted to be unintelligible. Pt with improved visual attention to R field and midline with Min-Mod A verbal and visual cues. Swallow function remains profoundly impaired with absent swallow initiation on the majority of trials despite Max A multimodal cues, with resultant delayed cough on the majority of trials.  A: Pt continues to demonstrate stimulability for skilled ST intervention as evident by improvement in verbalizations upon request, maintaining focused attention for longer durations of time (up to 2 minutes vs only 30 seconds), and following one-step commands more readily with less prompting (Mod vs Max A). Swallow function remains relatively unchanged; will continue to monitor for readiness for MBSS. Will continue to benefit from skilled ST intervention to maximize pt's independence and decrease caregiver burden.  P: Pt left in bed with father at bedside. Continue per current ST POC.   Pain No indications of pain nor distress; NAD  Agitated Behavior Scale: TBI Observation Details Observation Environment: Pt room Start of observation period - Date: 06/22/22 Start of observation period - Time: 0730 End of observation period - Date: 06/22/22 End of observation period - Time: 0815 Agitated Behavior Scale (DO NOT LEAVE BLANKS) Short attention span, easy distractibility, inability to concentrate: Present to a slight degree Impulsive, impatient, low tolerance for pain or frustration: Present to a moderate degree Uncooperative, resistant to care, demanding: Absent Violent and/or threatening violence toward people or property: Absent Explosive and/or unpredictable anger: Absent Rocking, rubbing, moaning, or other self-stimulating behavior: Absent Pulling at tubes, restraints, etc.: Absent Wandering from treatment areas: Absent Restlessness, pacing, excessive movement: Absent Repetitive behaviors, motor, and/or verbal:  Absent Rapid, loud, or excessive talking: Absent Sudden changes of mood: Present to a slight degree Easily initiated or excessive crying and/or laughter: Absent Self-abusiveness, physical and/or verbal: Absent Agitated behavior scale total score: 18  Therapy/Group: Individual Therapy  Arva Slaugh A Tessica Cupo 06/22/2022, 12:12 PM

## 2022-06-22 NOTE — Progress Notes (Signed)
MD Riley Kill notified of pt WBC, new orders to recheck CBC in AM Mylo Red, LPN

## 2022-06-23 LAB — CBC
HCT: 37.3 % — ABNORMAL LOW (ref 39.0–52.0)
Hemoglobin: 13.3 g/dL (ref 13.0–17.0)
MCH: 32.1 pg (ref 26.0–34.0)
MCHC: 35.7 g/dL (ref 30.0–36.0)
MCV: 90.1 fL (ref 80.0–100.0)
Platelets: 182 10*3/uL (ref 150–400)
RBC: 4.14 MIL/uL — ABNORMAL LOW (ref 4.22–5.81)
RDW: 11.9 % (ref 11.5–15.5)
WBC: 8.9 10*3/uL (ref 4.0–10.5)
nRBC: 0 % (ref 0.0–0.2)

## 2022-06-23 LAB — GLUCOSE, CAPILLARY
Glucose-Capillary: 105 mg/dL — ABNORMAL HIGH (ref 70–99)
Glucose-Capillary: 108 mg/dL — ABNORMAL HIGH (ref 70–99)
Glucose-Capillary: 111 mg/dL — ABNORMAL HIGH (ref 70–99)
Glucose-Capillary: 116 mg/dL — ABNORMAL HIGH (ref 70–99)
Glucose-Capillary: 130 mg/dL — ABNORMAL HIGH (ref 70–99)
Glucose-Capillary: 139 mg/dL — ABNORMAL HIGH (ref 70–99)
Glucose-Capillary: 95 mg/dL (ref 70–99)

## 2022-06-23 NOTE — Patient Care Conference (Signed)
Inpatient RehabilitationTeam Conference and Plan of Care Update Date: 06/23/2022   Time: 10:42 AM    [name]      Medical Record Number: [id number]  Date of Birth: 1986-06-26 Sex: Male         Room/Bed: 4W14C/4W14C-01 Payor Info: Payor: [place] MEDICAID [employer]PAID HEALTH PLAN / Plan: [place] MEDICAID [employer] / Product Type: *No Product type* /    Admit Date/Time:  05/08/2022  3:31 PM  Primary Diagnosis:  TBI (traumatic brain injury) [facility])  Hospital Problems: Principal Problem:   TBI (traumatic brain injury) [facility]) Active Problems:   Malnutrition of moderate degree    Expected Discharge Date: Expected Discharge Date: 07/03/22  Team Members Present: Physician leading conference: Dr. Faith Rogue Social Worker Present: Lavera Guise, BSW Nurse Present: Kennyth Arnold, RN PT Present: Malachi Pro, PT OT Present: Jake Shark, OT SLP Present: Eilene Ghazi, SLP     Current Status/Progress Goal Weekly Team Focus  Bowel/Bladder   incontinent b/b  regain continence  toilet q 2-3 hours and prn   Swallow/Nutrition/ Hydration   NPO with PEG, still exhibiting consistent s/sx concerning for aspiration with trials of thin water via spoon and straw.  Mod A with least restrictive diet  ongoing trials with focus on oral aceptance, manipulation, and swallow iniation   ADL's   Improved initiation with functional tasks, more RLAS IV behaviors, frustration tolerance. mod-max A +2 transfers  mod-max A overall, caregiver goals  family education, initiation, transfers, self care retraining/initiation   Mobility   modA to maxA bed moiblity, mod to maxA +2 sit to stand, maxA +3 gait x75'  maxA bed level  transfer training, bed mobility, attention, family ed, [facility] consult   Communication   Mod to Max A multimodal cues when given verbal choice of two.  Mod A  verbalizing wants/needs at word level, following basic commands   Safety/Cognition/ Behavioral Observations   Mod to Max A  Mod A  sustained attention, initiation, and command following   Pain   no reports of pain  remain pain free  will assess pain q 4hr and prn   Skin   CDI  skin to remain intact  assess skin q shift and prn     Discharge Planning:  Discharging home with S.O. HH established with [facility] Sw waiting on DME reccs.   Team Discussion: Progressed this past week. Speaking more words. Will continue Ritalin. Heart rate improved. Incontinent B/B, no reported pain. Discharging home with S/O. Improved initiation. Displaying [place] IV behaviors. CBG's good. WBC's elevated yesterday, back down today. Recommending suction for discharge.   Patient on target to meet rehab goals: Mod/max assist overall for family. Currently mod/max assist overall. Reported back pain, tone has improved. Steady training with S/O.   *See Care Plan and progress notes for long and short-term goals.   Revisions to Treatment Plan:  Adjusting medications   Teaching Needs: Family education, medication management, bowel/bladder management, transfer training, etc.   Current Barriers to Discharge: No SLP coverage  Possible Resolutions to Barriers: SLP coverage at discharge     Medical Summary Current Status: improved initiation and language. tachycardia and cough improved. mild right sided flexor/extensor tone  Barriers to Discharge: Medical stability   Possible Resolutions to [employer] Focus: maximizing attention, improve secretion mgt, nutritional optimization   Continued Need for Acute Rehabilitation Level of Care: The patient requires daily medical management by a physician with specialized training in physical medicine and rehabilitation for the following reasons:  Direction of a multidisciplinary physical rehabilitation program to maximize functional independence : Yes Medical management of patient stability for increased activity during participation in an intensive rehabilitation regime.:  Yes Analysis of laboratory values and/or radiology reports with any subsequent need for medication adjustment and/or medical intervention. : Yes   I attest that I was present, lead the team conference, and concur with the assessment and plan of the team.   Tennis Must 06/23/2022, 2:12 PM

## 2022-06-23 NOTE — Progress Notes (Signed)
Physical Therapy Session Note  Patient Details  Name: Carl Carpenter MRN: [id number] Date of Birth: 1986/04/16  Today's Date: 06/23/2022 PT Individual Time: 0865-7846 and [id number] PT Individual Time Calculation (min): 40 min and 43 min  Short Term Goals: Week 7:  PT Short Term Goal 1 (Week 7): Pt will complete bed mobility with modA +1. PT Short Term Goal 2 (Week 7): Pt will complete bed to chair transfer with modA +1. PT Short Term Goal 3 (Week 7): Pt will ambulate 100' with modA +2 and LRAD.  Skilled Therapeutic Interventions/Progress Updates:     1st Session: Pt received supine in bed and agrees to therapy. During session pt able to say that he has pain in "back". PT provides repositioning and rest breaks to manage pain. Pt performs bilateral rolling to initiate session due to having brief soiled with urine and feces. maxA +1 required for rolling with cues for hand placement and body mechanics. Pt bridges in supine to assist with donning shorts. Supine to sit with maxA +1 and minA +2, with cues for logrolling and sequencing. Pt performs sit to stand with Antony Salmon and girlfriend Energy manager of stedy. Pt requires maxA +2 to complete and mod to maxA +2 for stability while perched in [place]. Transfer to [facility] with totalA. WC transport to gym for time management. Pt performs sit to stand with maxA +2 HHA, with cues for body mechanics, initiation, hips and trunk extension and upright posture. Pt cued to attempt tossing bean bag but is unable to successfully follow cue. WC transport back to room. Stedy transfer back to be with [name] managing stedy and PT and tech providing trunk support. Left supine with alarm intact and all needs within reach.  2nd Session: Pt received seated in [place] and agrees to therapy. No indication of pain. WC transport to gym for time management. Pt performs sit to stand, holding onto parallel bar with both hands, and facing mirror for visual feedback. Pt requires maxA  +2 and cues for anterior weight transfer, body mechanics, hip extension and posture. Pt is very forward flexed in standing and difficult to assist into better posture, appearing very fatigued. Pt returns to sitting. Stand pivot transfer to mat with maxA +1 and modA +2, with cues for initiation, sequencing, and positioning. Pt sit in short sitting with exercise ball posterior to pt to promote stability as well as providing optimal positioning for soft tissue stretching of anterior muscle groups. Pt alternate sitting in semi reclined, "stretched" posture, and upright sitting with both hands on thighs to promote functional positioning and working core strength and balance. Pt requires modA overall for posture and balance. Stand pivot from mat>WC>bed with maxA +2. Sit to supine with maxA +2 and cues for sequencing. Pt bridge to assist with removing shorts in supine. Left with all needs within reach.  Therapy Documentation Precautions:  Precautions Precautions: Fall Precaution Booklet Issued: No Precaution Comments: PEG with binder Restrictions Weight Bearing Restrictions: No   Therapy/Group: Individual Therapy  [name], PT, DPT 06/23/2022, 5:55 PM

## 2022-06-23 NOTE — Progress Notes (Signed)
PROGRESS NOTE   Subjective/Complaints:  Pt up with s.o. Coughing a bit more overnight into this am.    ROS: limited d/t cognition  Objective:   No results found. Recent Labs    06/22/22 0736 06/23/22 0528  WBC 12.0* 8.9  HGB 12.9* 13.3  HCT 37.1* 37.3*  PLT 190 182        Recent Labs    06/22/22 0736  NA 137  K 3.8  CL 103  CO2 28  GLUCOSE 95  BUN 14  CREATININE 0.63  CALCIUM 9.5         Intake/Output Summary (Last 24 hours) at 06/23/2022 1125 Last data filed at 06/23/2022 0917 Gross per 24 hour  Intake --  Output 901 ml  Net -901 ml              Physical Exam: Vital Signs Blood pressure 122/73, pulse 86, temperature 97.9 F (36.6 C), resp. rate 14, height 6\' 5"  (1.956 m), weight 106.9 kg, SpO2 95 %.  Constitutional: No distress . Vital signs reviewed. HEENT: NCAT, EOMI, oral membranes moist Neck: supple Cardiovascular: RRR without murmur. No JVD    Respiratory/Chest: CTA Bilaterally without wheezes or rales. Normal effort    GI/Abdomen: BS +, non-tender, non-distended Ext: no clubbing, cyanosis, or edema Psych: flat but more engaging  Skin: No evidence of breakdown, no evidence of rash Neurologic: making eye contact and engaging.   Speech dysarthric and hypophonic but intelligible at times, other times garble.  Tended to right side with verbal cueing today.  Extends LLE 5/5 to command . 4/5 grossly on Right with cueing. Mild flex/ext tone right UE/LE which is easily broken with PROM--stable Musculoskeletal: no jt pain   Assessment/Plan: 1. Functional deficits which require 3+ hours per day of interdisciplinary therapy in a comprehensive inpatient rehab setting. Physiatrist is providing close team supervision and 24 hour management of active medical problems listed below. Physiatrist and rehab team continue to assess barriers to discharge/monitor patient progress toward functional and  medical goals  Care Tool:  Bathing    Body parts bathed by patient: Face, Chest, Left upper leg, Left lower leg   Body parts bathed by helper: Right arm, Left arm, Abdomen, Front perineal area, Buttocks, Right lower leg, Right upper leg     Bathing assist Assist Level: 2 Helpers     Upper Body Dressing/Undressing Upper body dressing   What is the patient wearing?: Pull over shirt    Upper body assist Assist Level: Maximal Assistance - Patient 25 - 49%    Lower Body Dressing/Undressing Lower body dressing      What is the patient wearing?: Pants, Incontinence brief     Lower body assist Assist for lower body dressing: 2 Helpers     Toileting Toileting    Toileting assist Assist for toileting: 2 Helpers     Transfers Chair/bed transfer  Transfers assist  Chair/bed transfer activity did not occur: Safety/medical concerns  Chair/bed transfer assist level: Total Assistance - Patient < 25%     Locomotion Ambulation   Ambulation assist   Ambulation activity did not occur: Safety/medical concerns  Walk 10 feet activity   Assist  Walk 10 feet activity did not occur: Safety/medical concerns        Walk 50 feet activity   Assist Walk 50 feet with 2 turns activity did not occur: Safety/medical concerns         Walk 150 feet activity   Assist Walk 150 feet activity did not occur: Safety/medical concerns         Walk 10 feet on uneven surface  activity   Assist Walk 10 feet on uneven surfaces activity did not occur: Safety/medical concerns         Wheelchair     Assist Is the patient using a wheelchair?: Yes Type of Wheelchair: Manual    Wheelchair assist level: Dependent - Patient 0%      Wheelchair 50 feet with 2 turns activity    Assist        Assist Level: Dependent - Patient 0%   Wheelchair 150 feet activity     Assist      Assist Level: Dependent - Patient 0%   Blood pressure 122/73, pulse  86, temperature 97.9 F (36.6 C), resp. rate 14, height 6\' 5"  (1.956 m), weight 106.9 kg, SpO2 95 %.    Medical Problem List and Plan: 1. 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 Interdisciplinary team conference today to discuss goals, barriers to discharge, and dc planning.   2.  DVT right posterior tibial and peroneal veins (03/22/22): Discussed with mother that repeat doppler shows no new clots and resolution of one previous clot.  -anticoagulation: continue Eliquis             -antiplatelet therapy: none 3. Pain: continueTylenol prn 4. Behavior/sleep: LCSW to evaluate and provide emotional support             -antipsychotic agents: none  -increasing propranolol for restlessness  -continue low dose seroquel at night for restlessness/agitation  --continue celexa 10mg  qhs for depression/irritability  5. Impaired attention/cognition: This patient is not capable of making decisions on his own behalf.             7/4 continued improvement. Speaking and initiating more   -keep ritalin at 20mg  bid, maintain above changes    - hold on sinemet for now 6. Dysphagia. S/p PEG 3/29.  --NPO -continue TF/boluses -Continue zinc oxide to protect skin surrounding PEG tube -5/30 replaced PEG under fluoro by IR 7. Fluids/Electrolytes/Nutrition: Routine Is and Os and follow-up chemistries             --continue tube feeds Jevity 1.5 boluses             --continue Prosource             -  gaining weight  7/4- weights stable- con't to monitor Filed Weights   06/20/22 0416 06/21/22 0500 06/22/22 0700  Weight: 103.9 kg 104.8 kg 106.9 kg    9: C1 right transverse process fracture--he's out of collar 10: Left Occipital  condyle fracture--out of collar 11: Acute ventilator respiratory failure requiring trach and PEG 3/29              --  decannulated , stoma closed            -resumed robinul and robitussin via PEG    -recent cxr clear.    -tachycardia- much improved with robinul/schedule robitussin on board  7/3- HR running in 90s-con't regimen 12: Right ear laceration:  healing 14: Tongue laceration: monitor healing 15: Hypertension/tachycardia:        -   propranolol increased to 40mg  qid   improved     06/23/2022    4:20 AM 06/22/2022    7:43 PM 06/22/2022    2:35 PM  Vitals with BMI  Systolic 122 130 08/23/2022  Diastolic 73 85 81  Pulse 86 93 89    16. Hypoglycemic to 69: otherwise in 90s: will d/c ISS since not requiring   18. Iron deficiency:  conitnue daily iron supplement  19. Tremor,?clonus: continue propranolol 40mg  qid 20, Constipation: ?mild  -moving bowels--+BM 7/3 22. Slightly Elevated Ammonia: resolved  -potentially d/t vpa 23. Leukocytosis: 12k. Improved to 8.9 7/4     LOS: 46 days A FACE TO FACE EVALUATION WAS PERFORMED  573 06/23/2022, 11:25 AM

## 2022-06-23 NOTE — Progress Notes (Signed)
Patient ID: Carl Carpenter, male   DOB: 05/05/1986, 36 y.o.   MRN: [id number]  Team Conference Report to Patient/Family  Team Conference discussion was reviewed with the patient and caregiver, including goals, any changes in plan of care and target discharge date.  Patient and caregiver express understanding and are in agreement.  The patient has a target discharge date of 07/03/22.  Covering for primary sw, Auria.  Sw met with patient and [name] to provide team conference updates. Family has been completing ongoing family education. Family in process of rearranging home to patient room for steady and other DME. Family also in the process of purchasing a steady for at home use. PT plans to arrange a custom WC consult, please make family aware of date and time. Sw has informed family of [place]. No additional questions or concerns.   Dyanne Iha 06/23/2022, 11:40 AM

## 2022-06-23 NOTE — Progress Notes (Addendum)
Speech Language Pathology TBI Note  Patient Details  Name: Carl Carpenter MRN: [id number] Date of Birth: May 09, 1986  Today's Date: 06/23/2022 SLP Individual Time: 1036-1105 SLP Individual Time Calculation (min): 29 min  Short Term Goals: Week 7: SLP Short Term Goal 1 (Week 7): Pt will consume therapeutic trials of 1/2 teaspoons of thin liquids and ice chips with minimal overt s/s of aspiration over 2 consecutive sessions and max assist use of swallowing precautions prior to MBS or FEES. - Did not formally address this session. Max A to wipe drool from beard. Intermittent cough noted in the absence of PO.  SLP Short Term Goal 2 (Week 7): Pt will verbalize on command in 25% of opportunities with max assist multimodal when given chocies from a field of 2. - Pt verbalized on command in ~90% of opportunities with Min-Mod A (repetition and extended processing time) given verbal choice of 2 to 3.  SLP Short Term Goal 3 (Week 7): Pt will answer basic yes/no questions using via gestures or verbalizations in 50% of opportunities with mod assist multimodal cues over 2 sessions. - Pt answered personal yes/no questions via head nod/shake and verbalizations in ~40% of opportunities with Mod-Max A multimodal cues.   SLP Short Term Goal 4 (Week 7): Pt will demonstrate sustained attention on a targeted stimulus for 2 minutes with max assist multimodal cues over 2 sessions. - Pt demonstrated sustained attention on targeted stimulus (sorting Blink cards into two different color categories) for 5 minutes with Mod A.   SLP Short Term Goal 5 (Week 7): Pt will follow 1 step  commands in 75% of opportunities with mod assist multimodal cues over 2 sessions. Pt followed one-step commands in ~90% of opportunities given Min A to Mod A.  SLP Short Term Goal 6 (Week 7): Patient will initiate functional tasks with Mod A multimodal cues. - Min-Mod to Mod A multimodal cues.  Skilled Therapeutic Interventions: S: Pt seen this  date for skilled ST intervention targeting deglutition and cognitive-linguistic goals outlined above. Pt received awake/alert and sitting upright in bed. Significant other present throughout today's session. Attended to R visual field more readily. No apparent distress observed Participatory and cooperative throughout today's session. Demonstrated orientation x 2 (name with Mod I and location given verbal choice prompts of "home" and "hospital").  O: Please see above for objective data re: pt's performance on targeted goals. Family education ongoing re: ST POC, home exercise program, swallow function, and s/sx that may indicate readiness for participation in MBSS; pt's significant other verbalized understanding. Handout of tablet application provided for additional intervention outside of therapy sessions. Verbal output remains dysarthric, and benefits from Min-Mod verbal A to increase vocal intensity to in turn improve comprehensibility. Participated in rote speech tasks with Mod A. Continue to verbalize when given verbal choice prompts and is beginning to indicate yes/no responses with head and verbalizations given Mod-Max A. Participated in visual attention task of sorting colors given Mod A, faded to Min A with attention noted to L and R fields. Did not directly address dysphagia goals this session due to time constraints.  A: Pt continues to demonstrate stimulability for skilled ST intervention as evident by improvement in verbalizations when given binary verbal choice prompts, maintaining focused attention for longer durations of time (up to 5 minutes vs only goal of 2 minutes), and following one-step commands more readily with less prompting (Min-Mod to Mod A vs Max A). Swallow function remains relatively unchanged; will continue to monitor for  readiness for MBSS. With observable progression, pt will continue to benefit from skilled ST intervention to maximize pt's independence and decrease caregiver  burden.   P: Pt left in bed with significant other present. Bed alarm activated and all immediate needs met. Continue per current ST POC next session.  Pain No pain indicated; NAD  Agitated Behavior Scale: TBI Observation Details Observation Environment: Pt room Start of observation period - Date: 06/23/22 Start of observation period - Time: 1035 End of observation period - Date: 06/23/22 End of observation period - Time: 1100 Agitated Behavior Scale (DO NOT LEAVE BLANKS) Short attention span, easy distractibility, inability to concentrate: Present to a slight degree Impulsive, impatient, low tolerance for pain or frustration: Present to a moderate degree Uncooperative, resistant to care, demanding: Absent Violent and/or threatening violence toward people or property: Absent Explosive and/or unpredictable anger: Absent Rocking, rubbing, moaning, or other self-stimulating behavior: Absent Pulling at tubes, restraints, etc.: Absent Wandering from treatment areas: Absent Restlessness, pacing, excessive movement: Absent Repetitive behaviors, motor, and/or verbal: Absent Rapid, loud, or excessive talking: Absent Sudden changes of mood: Absent Easily initiated or excessive crying and/or laughter: Absent Self-abusiveness, physical and/or verbal: Absent Agitated behavior scale total score: 17  Therapy/Group: Individual Therapy  Raahim Shartzer A Amreen Raczkowski 06/23/2022, 1:23 PM

## 2022-06-23 NOTE — Progress Notes (Signed)
Occupational Therapy Session Note  Patient Details  Name: Carl Carpenter MRN: [id number] Date of Birth: 10-25-1986  Today's Date: 06/23/2022 OT Individual Time: 6803-2122 OT Individual Time Calculation (min): 56 min    Short Term Goals: Week 6:  OT Short Term Goal 1 (Week 6): Pt will initiate 25% of all commands during 1 simple ADL task OT Short Term Goal 2 (Week 6): Pt will complete stedy transfer with no more than mod +2 OT Short Term Goal 3 (Week 6): Pt will maintain sustained attention to simple ADL task for 30 sec  Skilled Therapeutic Interventions/Progress Updates:    Pt received supine with no indications of pain, father present. Pt saturated in urine despite father stating he had been changed 30 min prior. He required max A for peri hygiene but pt was able to perform anterior hygiene with mod cueing. He began tugging on penis and his hand was removed to prevent injury. He completed bed mobility rolling R and L with max A, good LUE grasp on the bedrail. He was able to bridge in bed with mod tactile cueing and achieve 50% of hip elevation. He transferred to EOB with max A of 1. He required mod A overall to sit EOB with improved head control. Stedy transfer with pt initially pushing with his LLE but once correctly he was able to stand with mod +2. He was taken to the therapy gym via TIS w/c. He donned boxing gloves and attempted reciprocal forward reaching in an established pattern in standing. D/t poor trunk control, unable to properly cue and assist trunk in standing so transitioned back to sitting. He was able to initiate 100% of the time with his LUE in a normal punch forward motor pattern, but required mod-max facilitation at the RUE despite showing obvious initiation. He was then transferred to the [employer] to perform reciprocal stepping and push/pull with his BUE/BLE. He required max A+2 for the transfer. Initially pt required max facilitation for R hip adduction and to correct his L foot  coming off the petal. After several facilitations to establish the pattern he was able to carryover with min facilitation at the R quad and at the R UE to maintain grasp. He transferred back to his TIS w/c. He returned to his room and oral care was performed with mod A with the suction toothbrush. Pt was left sitting up with his father [name] present. Seatbelt on.   Therapy Documentation Precautions:  Precautions Precautions: Fall Precaution Booklet Issued: No Precaution Comments: PEG with binder Restrictions Weight Bearing Restrictions: No   Therapy/Group: Individual Therapy  Crissie Reese 06/23/2022, 6:59 AM

## 2022-06-24 LAB — GLUCOSE, CAPILLARY
Glucose-Capillary: 96 mg/dL (ref 70–99)
Glucose-Capillary: 128 mg/dL — ABNORMAL HIGH (ref 70–99)
Glucose-Capillary: 97 mg/dL (ref 70–99)
Glucose-Capillary: 133 mg/dL — ABNORMAL HIGH (ref 70–99)

## 2022-06-24 NOTE — Progress Notes (Signed)
Physical Therapy Session Note  Patient Details  Name: Carl Carpenter MRN: [id number] Date of Birth: [birth year]  Today's Date: 06/24/2022 PT Individual Time: 1416-1530 PT Individual Time Calculation (min): 74 min   Short Term Goals: Week 7:  PT Short Term Goal 1 (Week 7): Pt will complete bed mobility with modA +1. PT Short Term Goal 2 (Week 7): Pt will complete bed to chair transfer with modA +1. PT Short Term Goal 3 (Week 7): Pt will ambulate 100' with modA +2 and LRAD.  Skilled Therapeutic Interventions/Progress Updates:    Pt received supine in bed and agrees to therapy. No indication of pain. Supine to sit with maxA +1 and cues for logrolling and sequencing. Pt performs transfer from bed to [employer] with Stedy and girlfriend [name] providing assistance and managing Stedy. Pt requires modA +2 to stand and maintain perched sitting balance in stedy due to poor trunk control and R lateral lean. PT provides verbal cues to pt and girlfriend regarding sequencing of transfer and optimal positioning. WC transport to gym for time management. Pt performs sit to stand to litegait with modA +2 and +4 assist to remain standing while harness is donned. Pt ambulates x40' in litegait to provide stability and body weight support, with assistance bilaterally to facilitate reciprocal gait pattern and lateral weight shifting. Pt noted to have soiled brief during bout of ambulation. Pt returns to room and performs stand pivot back to bed with max A+1 and minA +2. Pt performs bilateral rolling with maxA +1 to assist with pericare. Pt bridges in supine with verbal cues and stabilization at feet to prevent slipping, in order to assist with donning clean shorts. Supine to sit with modA +2. Stand pivot back to chair with maxA +1 and minA +2 with cues for hip and trunk extension and hand placement. Pt ambulates x40' with 3 musketeers technique, maxA +2, and +3 for WC follow. Pt demonstrate initiation on both sides to progress  legs during swing phase, but consistently needs manual assistance to functionally progress R leg, and 50% of time requires manual assistance with L leg. Stand pivot back to bed with maxA +2. Pt left supine with alarm intact and all needs within reach.  Therapy Documentation Precautions:  Precautions Precautions: Fall Precaution Booklet Issued: No Precaution Comments: PEG with binder Restrictions Weight Bearing Restrictions: No   Therapy/Group: Individual Therapy  [name], PT, DPT 06/24/2022, 4:56 PM

## 2022-06-24 NOTE — Progress Notes (Signed)
Speech Language Pathology Daily Session Note  Patient Details  Name: Carl Carpenter MRN: [id number] Date of Birth: 03-13-1986  Today's Date: 06/24/2022 SLP Individual Time: 1030-1130 SLP Individual Time Calculation (min): 60 min  Short Term Goals: Week 7: SLP Short Term Goal 1 (Week 7): Pt will consume therapeutic trials of 1/2 teaspoons of thin liquids and ice chips with minimal overt s/s of aspiration over 2 consecutive sessions and max assist use of swallowing precautions prior to MBS or FEES. SLP Short Term Goal 2 (Week 7): Pt will verbalize on command in 25% of opportunities with max assist multimodal when given chocies from a field of 2. SLP Short Term Goal 3 (Week 7): Pt will answer basic yes/no questions using via gestures or verbalizations in 50% of opportunities with mod assist multimodal cues over 2 sessions. SLP Short Term Goal 4 (Week 7): Pt will demonstrate sustained attention on a targeted stimulus for 2 minutes with max assist multimodal cues over 2 sessions. SLP Short Term Goal 5 (Week 7): Pt will follow 1 step  commands in 75% of opportunities with mod assist multimodal cues over 2 sessions. SLP Short Term Goal 6 (Week 7): Patient will initiate functional tasks with Mod A multimodal cues.  Skilled Therapeutic Interventions: Skilled treatment session focused on cognitive-communication goals. Upon arrival, patient was awake in the wheelchair. SLP facilitated session by providing extra time and Mod A verbal and visual cues for naming of functional items with 75% accuracy. Patient also read aloud at the phrase level with extra time and Min verbal cues and completed phrase closure task with 90% accuracy and Mod verbal cues. Throughout verbal tasks, patient with decreased intelligibility due to decreased movement of oral musculature and oral holding of secretions. Patient unable to swallow secretions despite Max A multimodal cues resulting in overt coughing throughout session. Patient  also participated in sorting coins from a field of 4 with Mod verbal cues for attention to task in a mildly distracting environment. Patient was incontinent of urine and was transferred back to bed via the [facility] with +2 assist for safety. Mod-Max verbal and tactile cues were needed for initiation with task with Max cues needed to remain upright. Total A for peri care. Patient left upright in bed with alarm on and father present. Continue with current plan of care.      Pain No/Denies Pain   Therapy/Group: Individual Therapy  Raymar Joiner 06/24/2022, 3:16 PM

## 2022-06-24 NOTE — Progress Notes (Signed)
Occupational Therapy Weekly Progress Note  Patient Details  Name: Carl Carpenter MRN: [id number] Date of Birth: February 08, 1986  Beginning of progress report period: June 17, 2022 End of progress report period: June 24, 2022  Today's Date: 06/24/2022 OT Individual Time: 0845-1000 OT Individual Time Calculation (min): 75 min    Patient has met 3 of 3 short term goals.  Carl Carpenter has made more consistent gains in attention, initiation, command following, and transfers this past week. He is verbalizing more and able to express some basic needs at times (inconsistent). His transfers with the stedy have improved and he requires mod +2 to initiate sit <> stand but is then able to carryover motor plan with as little as min +2 at times. His family is constantly present and feel comfortable providing max-total A +2 level care at home. Plan is for ADLs to be performed at bed level and stedy to be purchased privately for home use.   Patient continues to demonstrate the following deficits: muscle weakness, decreased cardiorespiratoy endurance, impaired timing and sequencing, abnormal tone, unbalanced muscle activation, decreased coordination, and decreased motor planning, decreased attention to right, decreased initiation, decreased attention, decreased awareness, decreased problem solving, decreased safety awareness, decreased memory, delayed processing, and demonstrates behaviors consistent with Rancho Level IV (emerging), and decreased sitting balance, decreased standing balance, decreased postural control, hemiplegia, and decreased balance strategies and therefore will continue to benefit from skilled OT intervention to enhance overall performance with BADL and Reduce care partner burden.  Patient progressing toward long term goals..  Continue plan of care.  OT Short Term Goals Week 6:  OT Short Term Goal 1 (Week 6): Pt will initiate 25% of all commands during 1 simple ADL task OT Short Term Goal 1 - Progress  (Week 6): Met OT Short Term Goal 2 (Week 6): Pt will complete stedy transfer with no more than mod +2 OT Short Term Goal 2 - Progress (Week 6): Met OT Short Term Goal 3 (Week 6): Pt will maintain sustained attention to simple ADL task for 30 sec OT Short Term Goal 3 - Progress (Week 6): Met Week 7:  OT Short Term Goal 1 (Week 7): STG= LTG d/t ELOS  Skilled Therapeutic Interventions/Progress Updates:    Pt received supine with no c/o pain, agreeable to OT session. Session focused on shower level ADLs to address initiation, attention, sequencing, motor planning, R NMR, and transfer training. Pt completed bed mobility to EOB with max A of 1. He completed a stedy transfer with mod A+2 assist. Pt with improved initiation of both sit > stand and stand > sit in the stedy. He was transferred into the shower onto a reclining shower chair. He completed UB bathing with max A , with multimodal cueing to initiate bathing under his arms and UB. HOH provided to encourage functional use/initiation of the RUE during both hair washing and UB washing. He required total A for LB bathing. Peg tube very lose and requiring frequent support to keep the bumper against his abdomen. He completed stedy transfer out of the shower and back to his TIS w.c. He required mod A +2 to complete transfer. Pt did initiate donning shorts, threading his LLE into his shorts with min A and max A for the RLE and then max A +2 to stand and pull up. He completed oral care and hair care with max A overall. Pt was transported to the [facility] tower where there are big windows to provide orientation. Pt returned to his  room and was left sitting up with all needs met, father Carl Carpenter present.   ABS 17   Therapy Documentation Precautions:  Precautions Precautions: Fall Precaution Booklet Issued: No Precaution Comments: PEG with binder Restrictions Weight Bearing Restrictions: No  Therapy/Group: Individual Therapy  [name] 06/24/2022, 6:18 AM

## 2022-06-24 NOTE — Progress Notes (Signed)
PROGRESS NOTE   Subjective/Complaints:  Had a good night. Coughing less. Awoke and said good morning to me today. Dad said he had some spasms in his left shoulder last night which responded to massage  ROS: Limited due to cognitive/behavioral   Objective:   No results found. Recent Labs    06/22/22 0736 06/23/22 0528  WBC 12.0* 8.9  HGB 12.9* 13.3  HCT 37.1* 37.3*  PLT 190 182        Recent Labs    06/22/22 0736  NA 137  K 3.8  CL 103  CO2 28  GLUCOSE 95  BUN 14  CREATININE 0.63  CALCIUM 9.5         Intake/Output Summary (Last 24 hours) at 06/24/2022 0858 Last data filed at 06/24/2022 0622 Gross per 24 hour  Intake 594 ml  Output 1150 ml  Net -556 ml              Physical Exam: Vital Signs Blood pressure 106/69, pulse 80, temperature 98.1 F (36.7 C), resp. rate 14, height 6\' 5"  (1.956 m), weight 105.6 kg, SpO2 95 %.  Constitutional: No distress . Vital signs reviewed. HEENT: NCAT, EOMI, oral membranes moist Neck: supple Cardiovascular: RRR without murmur. No JVD    Respiratory/Chest: CTA Bilaterally without wheezes or rales. Normal effort    GI/Abdomen: BS +, non-tender, non-distended Ext: no clubbing, cyanosis, or edema Psych: flat Skin: No evidence of breakdown, no evidence of rash Neurologic: making eye contact and engaging.   Speech dysarthric and hypophonic but intelligible at times, other times garble.  Tended to right side with verbal cueing today.  Extends LLE 5/5 to command . 4/5 grossly on Right with cueing. Mild flex/ext tone right UE/LE which is easily broken with PROM--right hand open today Musculoskeletal: no jt pain   Assessment/Plan: 1. Functional deficits which require 3+ hours per day of interdisciplinary therapy in a comprehensive inpatient rehab setting. Physiatrist is providing close team supervision and 24 hour management of active medical problems listed  below. Physiatrist and rehab team continue to assess barriers to discharge/monitor patient progress toward functional and medical goals  Care Tool:  Bathing    Body parts bathed by patient: Face, Chest, Left upper leg, Left lower leg   Body parts bathed by helper: Right arm, Left arm, Abdomen, Front perineal area, Buttocks, Right lower leg, Right upper leg     Bathing assist Assist Level: 2 Helpers     Upper Body Dressing/Undressing Upper body dressing   What is the patient wearing?: Pull over shirt    Upper body assist Assist Level: Maximal Assistance - Patient 25 - 49%    Lower Body Dressing/Undressing Lower body dressing      What is the patient wearing?: Pants, Incontinence brief     Lower body assist Assist for lower body dressing: 2 Helpers     Toileting Toileting    Toileting assist Assist for toileting: 2 Helpers     Transfers Chair/bed transfer  Transfers assist  Chair/bed transfer activity did not occur: Safety/medical concerns  Chair/bed transfer assist level: Total Assistance - Patient < 25%     Locomotion Ambulation   Ambulation assist  Ambulation activity did not occur: Safety/medical concerns          Walk 10 feet activity   Assist  Walk 10 feet activity did not occur: Safety/medical concerns        Walk 50 feet activity   Assist Walk 50 feet with 2 turns activity did not occur: Safety/medical concerns         Walk 150 feet activity   Assist Walk 150 feet activity did not occur: Safety/medical concerns         Walk 10 feet on uneven surface  activity   Assist Walk 10 feet on uneven surfaces activity did not occur: Safety/medical concerns         Wheelchair     Assist Is the patient using a wheelchair?: Yes Type of Wheelchair: Manual    Wheelchair assist level: Dependent - Patient 0%      Wheelchair 50 feet with 2 turns activity    Assist        Assist Level: Dependent - Patient 0%    Wheelchair 150 feet activity     Assist      Assist Level: Dependent - Patient 0%   Blood pressure 106/69, pulse 80, temperature 98.1 F (36.7 C), resp. rate 14, height 6\' 5"  (1.956 m), weight 105.6 kg, SpO2 95 %.    Medical Problem List and Plan: 1. 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  DVT right posterior tibial and peroneal veins (03/22/22): Discussed with mother that repeat doppler shows no new clots and resolution of one previous clot.  -anticoagulation: continue Eliquis             -antiplatelet therapy: none 3. Pain: continueTylenol prn 4. Behavior/sleep: LCSW to evaluate and provide emotional support             -antipsychotic agents: none  -increasing propranolol for restlessness  -continue low dose seroquel at night for restlessness/agitation  --continue celexa 10mg  qhs for depression/irritability  5. Impaired attention/cognition: This patient is not capable of making decisions on his own behalf.             7/5 continued improvement. Speaking and initiating more   -maintain ritalin at 20mg  bid, maintain above changes    - hold on sinemet for now 6. Dysphagia. S/p PEG 3/29.  --NPO -continue TF/boluses -Continue zinc oxide to protect skin surrounding PEG tube -5/30 replaced PEG under fluoro by IR 7. Fluids/Electrolytes/Nutrition: Routine Is and Os and follow-up chemistries             --continue tube feeds Jevity 1.5 boluses             --continue Prosource             -  gaining weight  7/5- weights stable/improving Filed Weights   06/21/22 0500 06/22/22 0700 06/24/22 0500  Weight: 104.8 kg 106.9 kg 105.6 kg    9: C1 right transverse process fracture--he's out of  collar 10: Left Occipital condyle fracture--out of collar 11: Acute ventilator respiratory failure requiring trach and PEG 3/29              --  decannulated , stoma closed            -resumed robinul and robitussin via PEG    -recent cxr clear.    -tachycardia- much improved with robinul/schedule robitussin on board  7/3- HR running in 90s-con't regimen 12: Right ear laceration:  healing 14: Tongue laceration: monitor healing 15: Hypertension/tachycardia:        -   propranolol increased to 40mg  qid   improved     06/24/2022    5:00 AM 06/24/2022    4:55 AM 06/23/2022    7:35 PM  Vitals with BMI  Weight 232 lbs 13 oz    Systolic  106 121  Diastolic  69 75  Pulse  80 87    16. Hypoglycemic to 69: otherwise in 90s: will d/c ISS since not requiring   18. Iron deficiency:  conitnue daily iron supplement  19. Tremor,?clonus: continue propranolol 40mg  qid 20, Constipation: ?mild  -moving bowels--+BM 7/3 22. Slightly Elevated Ammonia: resolved  -potentially d/t vpa 23. Leukocytosis: 12k. Improved to 8.9 7/4     LOS: 47 days A FACE TO FACE EVALUATION WAS PERFORMED  08/24/2022 06/24/2022, 8:58 AM

## 2022-06-24 NOTE — Plan of Care (Signed)
  Problem: Consults Goal: Skin Care Protocol Initiated - if Braden Score 18 or less Description: If consults are not indicated, leave blank or document N/A Outcome: Not Progressing   Problem: RH BOWEL ELIMINATION Goal: RH STG MANAGE BOWEL WITH ASSISTANCE Description: STG Manage Bowel with Mod Assistance. Outcome: Not Progressing   Problem: RH BLADDER ELIMINATION Goal: RH STG MANAGE BLADDER WITH ASSISTANCE Description: STG Manage Bladder With Mod Assistance Outcome: Not Progressing

## 2022-06-25 LAB — GLUCOSE, CAPILLARY
Glucose-Capillary: 108 mg/dL — ABNORMAL HIGH (ref 70–99)
Glucose-Capillary: 118 mg/dL — ABNORMAL HIGH (ref 70–99)

## 2022-06-25 NOTE — Progress Notes (Signed)
Occupational Therapy TBI Note  Patient Details  Name: Carl Carpenter MRN: [id number] Date of Birth: [birth year]  Today's Date: 06/25/2022 OT Individual Time: 0915-1000 OT Individual Time Calculation (min): 45 min  15 min missed d/t nursing care   Short Term Goals: Week 7:  OT Short Term Goal 1 (Week 7): STG= LTG d/t ELOS  Skilled Therapeutic Interventions/Progress Updates:    Pt received supine with no c/o pain, agreeable to OT session. He came to EOB with only one assist with heavy LUE support. Great initiation/command following this session. Pt donned shorts with max A +2 from EOB- leaning forward with good initiation to bring his BLE into the shorts. He stood with mod A +2 to don shorts. He completed blocked practice stedy sit <> stand with BUE on the bar anteriorly. He completed 5x sit <> stand- requiring only CGA for bringing his hips completely off the bed!!! He required max A for terminal hip extension and for trunk/head elevation. CGA for stand > sit. He completed 15 ft of functional mobility with max +2 support but improved BLE activation. He required max A for trunk elevation and RLE advancement. RN arrived to administer tube feed- 15 min missed. Pt requesting to go out in the hallway. Pt taken out to the therapy gym. He completed 15 ft of functional mobility with max A+2 with heavy assist needed to facilitate weightshift and for trunk elevation. Pt now visibly very fatigued and taken back to his room. Pt was left sitting up with all needs met, seatbelt on. [name] present.    Therapy Documentation Precautions:  Precautions Precautions: Fall Precaution Booklet Issued: No Precaution Comments: PEG with binder Restrictions Weight Bearing Restrictions: No    Agitated Behavior Scale: TBI Observation Details Observation Environment: pt room Start of observation period - Date: 06/25/22 Start of observation period - Time: 0900 End of observation period - Date: 06/25/22 End of  observation period - Time: 1000 Agitated Behavior Scale (DO NOT LEAVE BLANKS) Short attention span, easy distractibility, inability to concentrate: Present to a moderate degree Impulsive, impatient, low tolerance for pain or frustration: Absent Uncooperative, resistant to care, demanding: Absent Violent and/or threatening violence toward people or property: Absent Explosive and/or unpredictable anger: Absent Rocking, rubbing, moaning, or other self-stimulating behavior: Absent Pulling at tubes, restraints, etc.: Absent Wandering from treatment areas: Absent Restlessness, pacing, excessive movement: Absent Repetitive behaviors, motor, and/or verbal: Present to a slight degree Rapid, loud, or excessive talking: Absent Sudden changes of mood: Absent Easily initiated or excessive crying and/or laughter: Absent Self-abusiveness, physical and/or verbal: Absent Agitated behavior scale total score: 17   Therapy/Group: Individual Therapy  [name] 06/25/2022, 9:36 AM

## 2022-06-25 NOTE — Progress Notes (Signed)
Patient now getting bolus tube feeds. CBG range is 95-139 without coverage. Will discontinue CBGs.

## 2022-06-25 NOTE — Progress Notes (Signed)
Speech Language Pathology Weekly Progress and Session Note  Patient Details  Name: Carl Carpenter MRN: [id number] Date of Birth: [birth year]  Beginning of progress report period: June 18, 2022 End of progress report period: June 25, 2022  Today's Date: 06/25/2022 SLP Individual Time: 2725-3664 SLP Individual Time Calculation (min): 60 min  Short Term Goals: Week 7: SLP Short Term Goal 1 (Week 7): Pt will consume therapeutic trials of 1/2 teaspoons of thin liquids and ice chips with minimal overt s/s of aspiration over 2 consecutive sessions and max assist use of swallowing precautions prior to MBS or FEES. SLP Short Term Goal 1 - Progress (Week 7): Not met SLP Short Term Goal 2 (Week 7): Pt will verbalize on command in 25% of opportunities with max assist multimodal when given chocies from a field of 2. SLP Short Term Goal 2 - Progress (Week 7): Met SLP Short Term Goal 3 (Week 7): Pt will answer basic yes/no questions using via gestures or verbalizations in 50% of opportunities with mod assist multimodal cues over 2 sessions. SLP Short Term Goal 3 - Progress (Week 7): Met SLP Short Term Goal 4 (Week 7): Pt will demonstrate sustained attention on a targeted stimulus for 2 minutes with max assist multimodal cues over 2 sessions. SLP Short Term Goal 4 - Progress (Week 7): Met SLP Short Term Goal 5 (Week 7): Pt will follow 1 step  commands in 75% of opportunities with mod assist multimodal cues over 2 sessions. SLP Short Term Goal 5 - Progress (Week 7): Met SLP Short Term Goal 6 (Week 7): Patient will initiate functional tasks with Mod A multimodal cues. SLP Short Term Goal 6 - Progress (Week 7): Met    New Short Term Goals: Week 8: STGs=LTGs due to ELOS  Weekly Progress Updates: Patient has made functional gains and has met 5 of 6 STGs this reporting period. Currently, patient demonstrates behaviors consistent with a Rancho Level IV and requires overall Mod-Max A multimodal cues for sustained  attention and initiation with functional and familiar tasks. Patient demonstrates improved functional communication and can express his basic wants/needs at the word level, especially when given binary choices. Patient's overall speech intelligibility is impacted by low vocal intensity and an overall decrease in movement of his oral musculature. Intelligibility is also impacted by patient's decreased management of secretions resulting in anterior spillage despite Max A multimodal cues. Patient remains NPO and has been participating in trials of thin liquids via spoon and straw with intermittent swallow initiation and consistent overt s/s of aspiration. However, recommend MBS early next week to assess overall swallow function and safety. Patient and family education ongoing. Patient would benefit from continued skilled SLP intervention to maximize his cognitive-linguistic and swallowing function prior to discharge.      Intensity: Minumum of 1-2 x/day, 30 to 90 minutes Frequency: 3 to 5 out of 7 days Duration/Length of Stay: 07/03/22 Treatment/Interventions: Cognitive remediation/compensation;Cueing hierarchy;Dysphagia/aspiration precaution training;Environmental controls;Internal/external aids;Speech/Language facilitation;Patient/family education;Functional tasks;Multimodal communication approach;Therapeutic Activities   Daily Session  Skilled Therapeutic Interventions:  Skilled treatment session focused on cognitive-communication goals. Upon arrival, patient was asleep while upright in the wheelchair. Patient required extra time and Max verbal and tactile cues to rouse. SLP facilitated session by providing Max A multimodal cues for patient to initiate functional tasks like oral care with increased perseverative grasp noted today compared to yesterday. Patient with decreased management of secretions resulting in large, consistent coughing episodes with Max A multimodal cues required to self-monitor and  correct.  Patient with increased verbal expression today at the phrase and sentence level at ~50% intelligibility. Extra time as well as multiple repetitions needed for clarification. SLP provided external aids to maximize recall to place and date that the patient utilized with overall Mod verbal and visual cues. Patient was incontinent of urine and was transferred to the bed via the [facility] with +2 assist for safety. Total A for peri care. Patient left upright in bed with alarm on and significant other present.    Pain No/Denies Pain   Therapy/Group: Individual Therapy  Carl Carpenter 06/25/2022, 6:23 AM

## 2022-06-25 NOTE — Progress Notes (Signed)
Physical Therapy Session Note  Patient Details  Name: Carl Carpenter MRN: [id number] Date of Birth: 1986/01/17  Today's Date: 06/25/2022 PT Individual Time: 4098-1191 PT Individual Time Calculation (min): 72 min   Short Term Goals: Week 7:  PT Short Term Goal 1 (Week 7): Pt will complete bed mobility with modA +1. PT Short Term Goal 2 (Week 7): Pt will complete bed to chair transfer with modA +1. PT Short Term Goal 3 (Week 7): Pt will ambulate 100' with modA +2 and LRAD.  Skilled Therapeutic Interventions/Progress Updates:     Pt received supine in bed and agrees to therapy. No complaint of pain. Pt performs supine bridge with cueing from PT to assist with donning shorts. Supine to sit with maxA +1 and cues for logrolling and sequencing. Stand pivot transfer to [facility] with maxA +2 and cues for initiation, sequencing,, and positioning. WC transport to gym for time management. Stand pivot to mat with maxA +2. Pt noted to have been incontinent of bowel so transferred back to [facility] with maxA +2. Stand pivot back to bed with maxA +1 and sit to supine with totalA +1 for urgency. Pt performs bilateral rolling with maxA +1 to assist with doffing brief and performing pericare. Pt clean and about to don new brief and again is incontinent of bowel, with liquid stool. Pt again performs bridging and bilateral rolling with verbal cues and maxA +1 for pericare and donning clean brief. Supine to sit with maxA +1. Stand pivot back to [facility] with maxA +1 and cues for body mechanics and hand placement. Stand pivot to mat table with maxA +1 and same cues. Pt works on sitting balance and attention to task while in short sitting, with emphasis on reaching for plastic rings to place in a Connect 4 to encourage pt to reach outside BOS, as well as achieve trunk extension to reach at head level to place rings. Pt able to perform with verbal cueing on L side and requires minA/modA to complete with R upper extremity.   Pt ambulates x30'  with maxA +2, 3 musketeers technique and +3 for WC follow, with manual and verbal cues for lateral weight shifting, progression of R lower extremity, upright gaze and trunk extension, and attention to task. Pt left seated in WC with all needs within reach.  2nd Session: Pt received seated in [facility] and agrees to therapy. No complaint of pain. WC transport to gym for time management. Pt performs sit to stand at parallel bar with mirror for visual feedback. Pt requires maxA and cues for initiation, hand placement, sequencing, hip and trunk extension, and symmetrical weight distribution. Pt noted to be soiled in standing so assisted back to sitting. WC transport back to room. Stand pivot to bed with maxA +1. Sit to supine with totalA for time management. Pt performs bilateral rolling with maxA for pericare. Pt left supine with alarm intact and all needs within reach.   Therapy Documentation Precautions:  Precautions Precautions: Fall Precaution Booklet Issued: No Precaution Comments: PEG with binder Restrictions Weight Bearing Restrictions: No    Therapy/Group: Individual Therapy  [name], PT, DPT 06/25/2022, 4:38 PM

## 2022-06-25 NOTE — Progress Notes (Signed)
PROGRESS NOTE   Subjective/Complaints:  Up with S.O. Pt speaking a lot more. Not always intelligible however. No coughing problems reported  ROS: Limited due to cognitive/behavioral   Objective:   No results found. Recent Labs    06/23/22 0528  WBC 8.9  HGB 13.3  HCT 37.3*  PLT 182        No results for input(s): "NA", "K", "CL", "CO2", "GLUCOSE", "BUN", "CREATININE", "CALCIUM" in the last 72 hours.        Intake/Output Summary (Last 24 hours) at 06/25/2022 0928 Last data filed at 06/25/2022 0725 Gross per 24 hour  Intake --  Output 400 ml  Net -400 ml              Physical Exam: Vital Signs Blood pressure 121/78, pulse 79, temperature 98.5 F (36.9 C), resp. rate 18, height 6\' 5"  (1.956 m), weight 107 kg, SpO2 94 %.  Constitutional: No distress . Vital signs reviewed. HEENT: NCAT, EOMI, oral membranes moist Neck: supple Cardiovascular: RRR without murmur. No JVD    Respiratory/Chest: CTA Bilaterally without wheezes or rales. Normal effort    GI/Abdomen: BS +, non-tender, non-distended Ext: no clubbing, cyanosis, or edema Psych: delayed but engages Skin: No evidence of breakdown, no evidence of rash Neurologic:   initiating speech a lot more but very dysarthric at times, ? garble.  Tended to right side with verbal cueing today, perhaps a little more easily.  Extends LLE 5/5 to command . 4/5 grossly on Right with cueing. Mild flex/ext tone right UE/LE which is easily broken with PROM--keeping right hand more open, using more. Musculoskeletal: no jt pain   Assessment/Plan: 1. Functional deficits which require 3+ hours per day of interdisciplinary therapy in a comprehensive inpatient rehab setting. Physiatrist is providing close team supervision and 24 hour management of active medical problems listed below. Physiatrist and rehab team continue to assess barriers to discharge/monitor patient  progress toward functional and medical goals  Care Tool:  Bathing    Body parts bathed by patient: Face, Chest, Left upper leg, Left lower leg   Body parts bathed by helper: Right arm, Left arm, Abdomen, Front perineal area, Buttocks, Right lower leg, Right upper leg     Bathing assist Assist Level: 2 Helpers     Upper Body Dressing/Undressing Upper body dressing   What is the patient wearing?: Pull over shirt    Upper body assist Assist Level: Maximal Assistance - Patient 25 - 49%    Lower Body Dressing/Undressing Lower body dressing      What is the patient wearing?: Pants, Incontinence brief     Lower body assist Assist for lower body dressing: 2 Helpers     Toileting Toileting    Toileting assist Assist for toileting: 2 Helpers     Transfers Chair/bed transfer  Transfers assist  Chair/bed transfer activity did not occur: Safety/medical concerns  Chair/bed transfer assist level: Total Assistance - Patient < 25%     Locomotion Ambulation   Ambulation assist   Ambulation activity did not occur: Safety/medical concerns          Walk 10 feet activity   Assist  Walk 10 feet  activity did not occur: Safety/medical concerns        Walk 50 feet activity   Assist Walk 50 feet with 2 turns activity did not occur: Safety/medical concerns         Walk 150 feet activity   Assist Walk 150 feet activity did not occur: Safety/medical concerns         Walk 10 feet on uneven surface  activity   Assist Walk 10 feet on uneven surfaces activity did not occur: Safety/medical concerns         Wheelchair     Assist Is the patient using a wheelchair?: Yes Type of Wheelchair: Manual    Wheelchair assist level: Dependent - Patient 0%      Wheelchair 50 feet with 2 turns activity    Assist        Assist Level: Dependent - Patient 0%   Wheelchair 150 feet activity     Assist      Assist Level: Dependent - Patient 0%    Blood pressure 121/78, pulse 79, temperature 98.5 F (36.9 C), resp. rate 18, height 6\' 5"  (1.956 m), weight 107 kg, SpO2 94 %.    Medical Problem List and Plan: 1. 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  DVT right posterior tibial and peroneal veins (03/22/22): Discussed with mother that repeat doppler shows no new clots and resolution of one previous clot.  -anticoagulation: continue Eliquis             -antiplatelet therapy: none 3. Pain: continueTylenol prn 4. Behavior/sleep: LCSW to evaluate and provide emotional support             -antipsychotic agents: none  - propranolol for restlessness  -  low dose seroquel at night for restlessness/agitation  -celexa 10mg  qhs for depression/irritability  5. Impaired attention/cognition: This patient is not capable of making decisions on his own behalf.             7/6 continued improvement. Speaking and initiating more   -maintain ritalin at 20mg  bid, maintain above changes    - hold on sinemet for now 6. Dysphagia. S/p PEG 3/29.  --NPO -continue TF/boluses -Continue zinc oxide to protect skin surrounding PEG tube -5/30 replaced PEG under fluoro by IR 7. Fluids/Electrolytes/Nutrition: Routine Is and Os and follow-up chemistries             --continue tube feeds Jevity 1.5 boluses             --continue Prosource             -  gaining weight  7/6- weights stable/improving Filed Weights   06/22/22 0700 06/24/22 0500 06/25/22 0443  Weight: 106.9 kg 105.6 kg 107 kg    9: C1 right transverse process fracture--he's out of collar 10: Left Occipital condyle fracture--out of collar 11: Acute ventilator respiratory failure requiring trach and PEG 3/29               --decannulated , stoma closed            -resumed robinul and robitussin via  PEG    -recent cxr clear.    -tachycardia- much improved with robinul/schedule robitussin on board  -managing secretions better 12: Right ear laceration:  healing 14: Tongue laceration: monitor healing 15: Hypertension/tachycardia:        -   propranolol increased to 40mg  qid   Improved control     06/25/2022    4:43 AM 06/25/2022    4:08 AM 06/24/2022    8:04 PM  Vitals with BMI  Weight 235 lbs 14 oz    Systolic  121 132  Diastolic  78 72  Pulse  79 90    16. Hypoglycemic to 69: otherwise in 90s: will d/c ISS since not requiring   18. Iron deficiency:  conitnue daily iron supplement  19. Tremor,?clonus: continue propranolol 40mg  qid 20, Constipation: ?mild  -moving bowels-  22. Slightly Elevated Ammonia: resolved  -potentially d/t vpa 23. Leukocytosis: 12k. Improved to 8.9 7/4     LOS: 48 days A FACE TO FACE EVALUATION WAS PERFORMED  08/25/2022 06/25/2022, 9:28 AM

## 2022-06-26 MED ORDER — FREE WATER
150.0000 mL | Freq: Three times a day (TID) | Status: DC
  Administered 2022-06-26 – 2022-06-29 (×13): 150 mL

## 2022-06-26 MED ORDER — LOPERAMIDE HCL 1 MG/7.5ML PO SUSP
2.0000 mg | ORAL | Status: DC | PRN
Start: 2022-06-26 — End: 2022-06-30
  Administered 2022-06-26 – 2022-06-28 (×5): 2 mg
  Filled 2022-06-26 (×5): qty 15

## 2022-06-26 NOTE — Progress Notes (Signed)
Patient tachycardic at 119-122. Yellow MEWS protocol initiated. Contacted Wendi Maya PA. Patient in no apparent distress. Please see new orders.

## 2022-06-26 NOTE — Progress Notes (Signed)
Physical Therapy Session Note  Patient Details  Name: Carl Carpenter MRN: [id number] Date of Birth: 29-Jun-1986  Today's Date: 06/26/2022 PT Individual Time: 0947-0962 PT Individual Time Calculation (min): 56 min   Short Term Goals: Week 7:  PT Short Term Goal 1 (Week 7): Pt will complete bed mobility with modA +1. PT Short Term Goal 2 (Week 7): Pt will complete bed to chair transfer with modA +1. PT Short Term Goal 3 (Week 7): Pt will ambulate 100' with modA +2 and LRAD.  Skilled Therapeutic Interventions/Progress Updates:     Pt received supine in bed and agrees to therapy.Partner, [name], reports that she was able to assist pt with donning shorts and that pt bridged on his own to assist. Pt performs supine to sit with modA/maxA +1 and cues for logrolling and sequencing. Pt performs transfer from bed to [facility] with Stedy. [name] provides management and navigation of Stedy and PT provides maxA to facilitate sit to stand, with cues for hand placement, anterior weight shift, initiation, and sequencing. Pt has poor trunk control in perched sitting, leaning forward and to the R, requiring modA/maxA for stability. PT educates [name] on safety and recommendation to always have 2 people when transferring pt, and to have one person positioned on pt's R side. WC transport to gym for time management. Pt performs stand pivot transfer to Nustep with maxA +1 and cues for initiation, sequencing, and positioning. Pt completes Nustep for NMR and challenging attention to task. Green therabands utilized to provide support and tactile feedback through feet in foot rest. Pt able to utilize both upper extremities with occasional cueing to re-grip on R. Pt completes x60 steps, with >75% of steps completed without physical assistance from PT for sequencing, though PT does provide consistent modA at trunk to keep pt in midline, as he has tendency to lose balance to the L and R during activity. Following, pt begins to slide  forward in Nustep and requires +2 for safety and transfer back to [facility]. Stand pivot back to bed with maxA. Pt left supine with alarm intact and all needs within reach.  Therapy Documentation Precautions:  Precautions Precautions: Fall Precaution Booklet Issued: No Precaution Comments: PEG with binder Restrictions Weight Bearing Restrictions: No    Therapy/Group: Individual Therapy  [name], PT, DPT 06/26/2022, 5:58 PM

## 2022-06-26 NOTE — Progress Notes (Signed)
Patient tachycardic 120 bpm last 30 minutes. No apparent distress. No further loose stool since 10am this morning, but did have 6 prior to that. Will check BMP and increase free water from 100cc to 150cc QID.

## 2022-06-26 NOTE — Progress Notes (Signed)
PROGRESS NOTE   Subjective/Complaints:  Pt with loose stool x 24 hours. Had a loose bm right before I came in this am.   ROS: Limited due to cognitive/behavioral    Objective:   No results found. No results for input(s): "WBC", "HGB", "HCT", "PLT" in the last 72 hours.       No results for input(s): "NA", "K", "CL", "CO2", "GLUCOSE", "BUN", "CREATININE", "CALCIUM" in the last 72 hours.        Intake/Output Summary (Last 24 hours) at 06/26/2022 1049 Last data filed at 06/26/2022 0432 Gross per 24 hour  Intake --  Output 250 ml  Net -250 ml              Physical Exam: Vital Signs Blood pressure (!) 123/94, pulse 97, temperature 98.5 F (36.9 C), temperature source Oral, resp. rate 16, height 6\' 5"  (1.956 m), weight 107 kg, SpO2 97 %.  Constitutional: No distress . Vital signs reviewed. HEENT: NCAT, EOMI, oral membranes moist Neck: supple Cardiovascular: RRR without murmur. No JVD    Respiratory/Chest: CTA Bilaterally without wheezes or rales. Normal effort    GI/Abdomen: BS +, non-tender, non-distended Ext: no clubbing, cyanosis, or edema Psych: flat but does engage with cues  Skin: No evidence of breakdown, no evidence of rash Neurologic:   initiating speech with cues,  but very dysarthric at times.  Tended to right side with verbal cueing today, perhaps a little more easily. Watched him sit-stand transfer in steady with min-mod assist.  Extends LLE 5/5 to command . 4/5 grossly on Right with cueing. Mild flex/ext tone right UE/LE which is easily broken with PROM--uses right hand to assist with transfer. Musculoskeletal: no jt pain   Assessment/Plan: 1. Functional deficits which require 3+ hours per day of interdisciplinary therapy in a comprehensive inpatient rehab setting. Physiatrist is providing close team supervision and 24 hour management of active medical problems listed below. Physiatrist and  rehab team continue to assess barriers to discharge/monitor patient progress toward functional and medical goals  Care Tool:  Bathing    Body parts bathed by patient: Face, Chest, Left upper leg, Left lower leg   Body parts bathed by helper: Right arm, Left arm, Abdomen, Front perineal area, Buttocks, Right lower leg, Right upper leg     Bathing assist Assist Level: 2 Helpers     Upper Body Dressing/Undressing Upper body dressing   What is the patient wearing?: Pull over shirt    Upper body assist Assist Level: Maximal Assistance - Patient 25 - 49%    Lower Body Dressing/Undressing Lower body dressing      What is the patient wearing?: Pants, Incontinence brief     Lower body assist Assist for lower body dressing: 2 Helpers     Toileting Toileting    Toileting assist Assist for toileting: 2 Helpers     Transfers Chair/bed transfer  Transfers assist  Chair/bed transfer activity did not occur: Safety/medical concerns  Chair/bed transfer assist level: Total Assistance - Patient < 25%     Locomotion Ambulation   Ambulation assist   Ambulation activity did not occur: Safety/medical concerns  Walk 10 feet activity   Assist  Walk 10 feet activity did not occur: Safety/medical concerns        Walk 50 feet activity   Assist Walk 50 feet with 2 turns activity did not occur: Safety/medical concerns         Walk 150 feet activity   Assist Walk 150 feet activity did not occur: Safety/medical concerns         Walk 10 feet on uneven surface  activity   Assist Walk 10 feet on uneven surfaces activity did not occur: Safety/medical concerns         Wheelchair     Assist Is the patient using a wheelchair?: Yes Type of Wheelchair: Manual    Wheelchair assist level: Dependent - Patient 0%      Wheelchair 50 feet with 2 turns activity    Assist        Assist Level: Dependent - Patient 0%   Wheelchair 150 feet  activity     Assist      Assist Level: Dependent - Patient 0%   Blood pressure (!) 123/94, pulse 97, temperature 98.5 F (36.9 C), temperature source Oral, resp. rate 16, height 6\' 5"  (1.956 m), weight 107 kg, SpO2 97 %.    Medical Problem List and Plan: 1. 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  DVT right posterior tibial and peroneal veins (03/22/22): Discussed with mother that repeat doppler shows no new clots and resolution of one previous clot.  -anticoagulation: continue Eliquis             -antiplatelet therapy: none 3. Pain: continueTylenol prn 4. Behavior/sleep: LCSW to evaluate and provide emotional support             -antipsychotic agents: none  - propranolol for restlessness  -  low dose seroquel at night for restlessness/agitation  -celexa 10mg  qhs for depression/irritability  5. Impaired attention/cognition: This patient is not capable of making decisions on his own behalf.             7/7 continued improvement. Speaking and initiating more   -maintain ritalin at 20mg  bid, maintain above changes    6. Dysphagia. S/p PEG 3/29.  --NPO -continue TF/boluses -Continue zinc oxide to protect skin surrounding PEG tube -5/30 replaced PEG under fluoro by IR 7. Fluids/Electrolytes/Nutrition: Routine Is and Os and follow-up chemistries             --continue tube feeds Jevity 1.5 boluses             --continue Prosource             -  gaining weight  7/7- weights improving Filed Weights   06/22/22 0700 06/24/22 0500 06/25/22 0443  Weight: 106.9 kg 105.6 kg 107 kg    9: C1 right transverse process fracture--he's out of collar 10: Left Occipital condyle fracture--out of collar 11:  Acute ventilator respiratory failure requiring trach and PEG 3/29              --decannulated , stoma closed            -  resumed robinul and robitussin via PEG    -recent cxr clear.    -tachycardia- much improved with robinul/schedule robitussin on board  -managing secretions better 12: Right ear laceration:  healing 14: Tongue laceration: monitor healing 15: Hypertension/tachycardia:        -   propranolol increased to 40mg  qid  7/7 Improved control     06/26/2022    4:31 AM 06/25/2022    7:53 PM 06/25/2022    3:49 PM  Vitals with BMI  Systolic [id number]  Diastolic 94 88 74  Pulse 97 98 72    16. Hypoglycemic to 69: otherwise in 90s: will d/c ISS since not requiring   18. Iron deficiency:  conitnue daily iron supplement  19. Tremor,?clonus: continue propranolol 40mg  qid 20, Constipation:  7/7---now with loose stools x 24 hours; no odor/fever  -hold senna-s -imodium prn  22. Slightly Elevated Ammonia: resolved  -potentially d/t vpa 23. Leukocytosis:   Improved to 8.9 7/4     LOS: 49 days A FACE TO [place] 06/26/2022, 10:49 AM

## 2022-06-26 NOTE — Progress Notes (Signed)
Occupational Therapy TBI Note  Patient Details  Name: Carl Carpenter MRN: [id number] Date of Birth: [birth year]  Today's Date: 06/26/2022 OT Individual Time: 4388-8757 OT Individual Time Calculation (min): 75 min    Short Term Goals: Week 7:  OT Short Term Goal 1 (Week 7): STG= LTG d/t ELOS  Skilled Therapeutic Interventions/Progress Updates:    Pt received supine with no c/o pain, agreeable to OT session. Pt completed bed mobility, rolling R and L with max A for +2 total A peri care. Incontinent BM present, very loose. MD aware. Pt completed bridging with improved activation and initiation, requiring assist to pull up pants in this position. He came to EOB with max A of 1. He sat EOB with max A for static sitting balance. Blocked practice sit <> stand in the stedy with only min A to initiate stand and heavy max A to activate glutes and bring trunk into upright. Pt able to maintain BUE grasp on the stedy rail. Transfer to the TIS w/c. He required mod A to brush hair/beard but good activation. Pt overall more flat/lethargic today. +2 stand pivot transfer to the mat. Focus of session on sitting balance (trunk/head righting), BUE activation in functional reaching against gravity, following repetitive motor commands/patterns, and sustained attention to task. Modified gym environment to reduce as much external stimulation as possible. Pt required max cueing throughout session. Poor activation of BUE with frequent internal/external distractions. Pt requiring max A for sitting balance with poor activation of core. Pt given extra time throughout session to allow for initiation. Mod A for squat pivot to the w/c with max cueing for initiation and extended period of time. Pt returned to his room and left sitting up with all needs met, seatbelt on and father present.    Therapy Documentation Precautions:  Precautions Precautions: Fall Precaution Booklet Issued: No Precaution Comments: PEG with  binder Restrictions Weight Bearing Restrictions: No    Agitated Behavior Scale: TBI Observation Details Observation Environment: CIR Start of observation period - Date: 06/26/22 Start of observation period - Time: 0815 End of observation period - Date: 06/26/22 End of observation period - Time: 0930 Agitated Behavior Scale (DO NOT LEAVE BLANKS) Short attention span, easy distractibility, inability to concentrate: Present to a moderate degree Impulsive, impatient, low tolerance for pain or frustration: Absent Uncooperative, resistant to care, demanding: Absent Violent and/or threatening violence toward people or property: Absent Explosive and/or unpredictable anger: Absent Rocking, rubbing, moaning, or other self-stimulating behavior: Absent Pulling at tubes, restraints, etc.: Absent Wandering from treatment areas: Absent Restlessness, pacing, excessive movement: Absent Repetitive behaviors, motor, and/or verbal: Absent Rapid, loud, or excessive talking: Absent Sudden changes of mood: Absent Easily initiated or excessive crying and/or laughter: Absent Self-abusiveness, physical and/or verbal: Absent Agitated behavior scale total score: 16   Therapy/Group: Individual Therapy  [name] 06/26/2022, 9:37 AM

## 2022-06-26 NOTE — Progress Notes (Signed)
Speech Language Pathology Daily Session Note  Patient Details  Name: Javarie Crisp MRN: [id number] Date of Birth: [birth year]  Today's Date: 06/26/2022 SLP Individual Time: 1032-1127 SLP Individual Time Calculation (min): 55 min  Short Term Goals: Week 8: STGs=LTGs due to ELOS  Skilled Therapeutic Interventions: Skilled treatment session focused on cognitive-communication and dysphagia goals. Upon arrival, patient had a large incontinent episode of loose bowel and was being transferred back to bed via th Stedy with +3 assist for safety and Max verbal cues to sit and release bar from the [place]. Patient followed 1-step commands during bed mobility and peri care with extra time and Max verbal cues. Patient expressed basic wants/needs intermittently throughout peri care with only 25% intelligibility. Patient repositioned in bed to maximize attention and arousal. However, patient appeared lethargic throughout session requiring extra time and multiple repetitions of commands. Patient required Max A to follow commands during oral care but quickly fell asleep despite Max A multimodal cues. Patient's father present and educated on plan of care for upcoming week and provided strategies to utilize to maximize verbal expression. Patient with large coughing episode on secretions which awakened the patient.  Patient answered questions in 25% of opportunities at the word level with extra time and Max verbal cues needed. Overall, patient less engaged this session, suspect due to fatigue. Patient left upright in bed with alarm on and all needs within reach. Continue with current plan of care.      Pain No/Denies Pain   Therapy/Group: Individual Therapy  Taelar Gronewold 06/26/2022, 12:16 PM

## 2022-06-27 ENCOUNTER — Inpatient Hospital Stay (HOSPITAL_COMMUNITY): Payer: Medicaid Other

## 2022-06-27 DIAGNOSIS — R411 Anterograde amnesia: Secondary | ICD-10-CM

## 2022-06-27 DIAGNOSIS — S062X9S Diffuse traumatic brain injury with loss of consciousness of unspecified duration, sequela: Secondary | ICD-10-CM

## 2022-06-27 DIAGNOSIS — R197 Diarrhea, unspecified: Secondary | ICD-10-CM

## 2022-06-27 LAB — BASIC METABOLIC PANEL
Anion gap: 8 (ref 5–15)
BUN: 16 mg/dL (ref 6–20)
CO2: 22 mmol/L (ref 22–32)
Calcium: 10.1 mg/dL (ref 8.9–10.3)
Chloride: 112 mmol/L — ABNORMAL HIGH (ref 98–111)
Creatinine, Ser: 0.78 mg/dL (ref 0.61–1.24)
GFR, Estimated: >60 mL/min (ref >60)
Glucose, Bld: 113 mg/dL — ABNORMAL HIGH (ref 70–99)
Potassium: 4.3 mmol/L (ref 3.5–5.1)
Sodium: 142 mmol/L (ref 135–145)

## 2022-06-27 LAB — CBC WITH DIFFERENTIAL/PLATELET
Abs Immature Granulocytes: 0.07 10*3/uL (ref 0.00–0.07)
Basophils Absolute: 0.1 10*3/uL (ref 0.0–0.1)
Basophils Relative: 1 %
Eosinophils Absolute: 0.2 10*3/uL (ref 0.0–0.5)
Eosinophils Relative: 2 %
HCT: 43.5 % (ref 39.0–52.0)
Hemoglobin: 15.4 g/dL (ref 13.0–17.0)
Immature Granulocytes: 1 %
Lymphocytes Relative: 31 %
Lymphs Abs: 3.7 10*3/uL (ref 0.7–4.0)
MCH: 31.7 pg (ref 26.0–34.0)
MCHC: 35.4 g/dL (ref 30.0–36.0)
MCV: 89.5 fL (ref 80.0–100.0)
Monocytes Absolute: 0.8 10*3/uL (ref 0.1–1.0)
Monocytes Relative: 6 %
Neutro Abs: 7 10*3/uL (ref 1.7–7.7)
Neutrophils Relative %: 59 %
Platelets: 252 10*3/uL (ref 150–400)
RBC: 4.86 MIL/uL (ref 4.22–5.81)
RDW: 12.4 % (ref 11.5–15.5)
WBC: 11.8 10*3/uL — ABNORMAL HIGH (ref 4.0–10.5)
nRBC: 0 % (ref 0.0–0.2)

## 2022-06-27 LAB — MAGNESIUM: Magnesium: 2 mg/dL (ref 1.7–2.4)

## 2022-06-27 NOTE — Progress Notes (Signed)
Speech Language Pathology TBI Note  Patient Details  Name: Carl Carpenter MRN: [id number] Date of Birth: [birth year]  Today's Date: 06/27/2022 SLP Individual Time: 1016-1100 SLP Individual Time Calculation (min): 44 min  Short Term Goals: Week 8:  STGs=LTGs due to ELOS  Skilled Therapeutic Interventions: Pt seen for skilled ST with focus on swallow and cognitive goals, pt in bed very alert and agreeable to therapeutic tasks. Pt with consistent verbal expression this date at word and phrase level, wife reports pt has been asking for food and liquid lately which he hasn't up to this point. Pt would express  "more", "come on", "please" and "water" throughout. SLP facilitating oral care via suction toothbrush by providing Total A for thoroughness and time management. Pt consuming 1/4 tsps of thin water via tsp with overt s/s aspiration on ~75% trials and requiring max A verbal and tactile cues to attempt to initiate swallow. Pt with absent swallow on ~50% of trials despite max cues. At this time, pt with incontinent episode of loose bowel which required SLP and staff assist to assist with clean up, bed and clothing change. Pt following 1-step commands during bed mobility and peri care with mod A cues. Pt left in bed with alarm set and wife present for needs. Educated on upcoming MBS, wife verbalized understanding. Cont ST POC.  Pain Pain Assessment Pain Scale: 0-10 Pain Score: 0-No pain  Agitated Behavior Scale: TBI Observation Details Observation Environment: CIR Start of observation period - Date: 06/27/22 Start of observation period - Time: 1015 End of observation period - Date: 06/27/22 End of observation period - Time: 1100 Agitated Behavior Scale (DO NOT LEAVE BLANKS) Short attention span, easy distractibility, inability to concentrate: Present to a moderate degree Impulsive, impatient, low tolerance for pain or frustration: Present to a slight degree Uncooperative, resistant to care,  demanding: Absent Violent and/or threatening violence toward people or property: Absent Explosive and/or unpredictable anger: Absent Rocking, rubbing, moaning, or other self-stimulating behavior: Absent Pulling at tubes, restraints, etc.: Absent Wandering from treatment areas: Absent Restlessness, pacing, excessive movement: Absent Repetitive behaviors, motor, and/or verbal: Present to a slight degree Rapid, loud, or excessive talking: Absent Sudden changes of mood: Absent Easily initiated or excessive crying and/or laughter: Absent Self-abusiveness, physical and/or verbal: Absent Agitated behavior scale total score: 18  Therapy/Group: Individual Therapy  Tacey Ruiz 06/27/2022, 11:22 AM

## 2022-06-27 NOTE — Progress Notes (Signed)
Received call from bedside nurse of patient having red, raw skin of buttocks due to frequent loose stools.  Order placed for Flexiseal to contain stool and protect the patient's skin.

## 2022-06-27 NOTE — Progress Notes (Signed)
Patient had two large, watery, yellow stools. Pericare and linen changed. Imodium given. Free water administered with meds. Wife at bedside and expressed concern of frequency and consistency of pt's stool. Tressia Miners , NP informed and no additional order given at this time. Vital signs within normal limit.

## 2022-06-27 NOTE — Progress Notes (Signed)
[name] informed that this RN is going to insert a Flexiseal to contain patient's stool and protect his skin. Mother agreeable and stated she knows/familiar with it. Flexiseal inserted and yellow liquid stool started draining. Consulting civil engineer at bedside assisting.

## 2022-06-27 NOTE — Progress Notes (Signed)
PROGRESS NOTE   Subjective/Complaints:  DIscussed loose stools with significant other , per documentation started THurs, no vomiting.   No fevers, off Senna x 24h, reviewed meds  , BMET nl this am  ROS: Limited due to cognitive/behavioral    Objective:   No results found. No results for input(s): "WBC", "HGB", "HCT", "PLT" in the last 72 hours.       Recent Labs    06/27/22 0532  NA 142  K 4.3  CL 112*  CO2 22  GLUCOSE 113*  BUN 16  CREATININE 0.78  CALCIUM 10.1          Intake/Output Summary (Last 24 hours) at 06/27/2022 0735 Last data filed at 06/27/2022 0128 Gross per 24 hour  Intake 310 ml  Output --  Net 310 ml            Physical Exam: Vital Signs Blood pressure (!) 133/94, pulse (!) 104, temperature 97.8 F (36.6 C), temperature source Oral, resp. rate 16, height 6\' 5"  (1.956 m), weight 100.5 kg, SpO2 97 %.   General: No acute distress Mood and affect are appropriate Heart: Regular rate and rhythm no rubs murmurs or extra sounds Lungs: Clear to auscultation, breathing unlabored, no rales or wheezes Abdomen: Positive bowel sounds, soft nontender to palpation, nondistended Extremities: No clubbing, cyanosis, or edema Skin: No evidence of breakdown, no evidence of rash   Skin: No evidence of breakdown, no evidence of rash Neurologic:   initiating speech with cues,  but very dysarthric at times.  Tended to right side with verbal cueing today, perhaps a little more easily. Watched him sit-stand transfer in steady with min-mod assist.  Extends LLE 5/5 to command . 4/5 grossly on Right with cueing. Mild flex/ext tone right UE/LE which is easily broken with PROM--Musculoskeletal: no jt swelling    Assessment/Plan: 1. Functional deficits which require 3+ hours per day of interdisciplinary therapy in a comprehensive inpatient rehab setting. Physiatrist is providing close team supervision and 24  hour management of active medical problems listed below. Physiatrist and rehab team continue to assess barriers to discharge/monitor patient progress toward functional and medical goals  Care Tool:  Bathing    Body parts bathed by patient: Face, Chest, Left upper leg, Left lower leg   Body parts bathed by helper: Right arm, Left arm, Abdomen, Front perineal area, Buttocks, Right lower leg, Right upper leg     Bathing assist Assist Level: 2 Helpers     Upper Body Dressing/Undressing Upper body dressing   What is the patient wearing?: Pull over shirt    Upper body assist Assist Level: Maximal Assistance - Patient 25 - 49%    Lower Body Dressing/Undressing Lower body dressing      What is the patient wearing?: Pants, Incontinence brief     Lower body assist Assist for lower body dressing: 2 Helpers     Toileting Toileting    Toileting assist Assist for toileting: 2 Helpers     Transfers Chair/bed transfer  Transfers assist  Chair/bed transfer activity did not occur: Safety/medical concerns  Chair/bed transfer assist level: Total Assistance - Patient < 25%     Locomotion Ambulation  Ambulation assist   Ambulation activity did not occur: Safety/medical concerns          Walk 10 feet activity   Assist  Walk 10 feet activity did not occur: Safety/medical concerns        Walk 50 feet activity   Assist Walk 50 feet with 2 turns activity did not occur: Safety/medical concerns         Walk 150 feet activity   Assist Walk 150 feet activity did not occur: Safety/medical concerns         Walk 10 feet on uneven surface  activity   Assist Walk 10 feet on uneven surfaces activity did not occur: Safety/medical concerns         Wheelchair     Assist Is the patient using a wheelchair?: Yes Type of Wheelchair: Manual    Wheelchair assist level: Dependent - Patient 0%      Wheelchair 50 feet with 2 turns activity    Assist         Assist Level: Dependent - Patient 0%   Wheelchair 150 feet activity     Assist      Assist Level: Dependent - Patient 0%   Blood pressure (!) 133/94, pulse (!) 104, temperature 97.8 F (36.6 C), temperature source Oral, resp. rate 16, height 6\' 5"  (1.956 m), weight 100.5 kg, SpO2 97 %.    Medical Problem List and Plan: 1. 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  DVT right posterior tibial and peroneal veins (03/22/22): Discussed with mother that repeat doppler shows no new clots and resolution of one previous clot.  -anticoagulation: continue Eliquis             -antiplatelet therapy: none 3. Pain: continueTylenol prn 4. Behavior/sleep: LCSW to evaluate and provide emotional support             -antipsychotic agents: none  - propranolol for restlessness  -  low dose seroquel at night for restlessness/agitation  -celexa 10mg  qhs for depression/irritability  5. Impaired attention/cognition: This patient is not capable of making decisions on his own behalf.             7/7 continued improvement. Speaking and initiating more   -maintain ritalin at 20mg  bid, maintain above changes    6. Dysphagia. S/p PEG 3/29.  --NPO -continue TF/boluses -Continue zinc oxide to protect skin surrounding PEG tube -5/30 replaced PEG under fluoro by IR- PEG tube looks ok 7. Fluids/Electrolytes/Nutrition: Routine Is and Os and follow-up chemistries             --continue tube feeds Jevity 1.5 boluses             --continue Prosource             -  gaining weight  7/7- weights improving Filed Weights   06/24/22 0500 06/25/22 0443 06/27/22 0600  Weight: 105.6 kg 107 kg 100.5 kg    9: C1 right transverse  process fracture--he's out of collar 10: Left Occipital condyle fracture--out of collar 11: Acute ventilator respiratory failure requiring trach  and PEG 3/29              --decannulated , stoma closed            -resumed robinul and robitussin via PEG    -recent cxr clear.    -tachycardia- much improved with robinul/schedule robitussin on board  -managing secretions better 12: Right ear laceration:  healing 14: Tongue laceration: monitor healing 15: Hypertension/tachycardia:        -   propranolol increased to 40mg  qid  7/7 Improved control     06/27/2022    6:00 AM 06/27/2022    4:58 AM 06/27/2022    1:20 AM  Vitals with BMI  Weight 221 lbs 9 oz    Systolic  133 110  Diastolic  94 75  Pulse  104 92    16. Hypoglycemic to 69: otherwise in 90s: will d/c ISS since not requiring   18. Iron deficiency:  conitnue daily iron supplement  19. Tremor,?clonus: continue propranolol 40mg  qid 20, Constipation:  7/7---now with loose stools x 24 hours; no odor/fever  -hold senna-s -imodium prn  22. Slightly Elevated Ammonia: resolved  -potentially d/t vpa 23. Leukocytosis:   resolved  24.  Loose stools would expect with TF, no change in formula, was having constipation but senna stopped when diarrhea started.  No meds that typically cause diarrhea, no recent abx.  Will check KUB to see if there is a stool ball in colon.  K+ ok , check Mg++ Hold off on  stool studies given above will monitor      LOS: 50 days A FACE TO FACE EVALUATION WAS PERFORMED  08/28/2022 06/27/2022, 7:35 AM

## 2022-06-27 NOTE — Progress Notes (Addendum)
Occupational Therapy TBI Note  Patient Details  Name: Axzel Rockhill MRN: [id number] Date of Birth: 1986/08/03  Today's Date: 06/27/2022 OT Individual Time: 1415-1500 OT Individual Time Calculation (min): 45 min    Short Term Goals: Week 7:  OT Short Term Goal 1 (Week 7): STG= LTG d/t ELOS  Skilled Therapeutic Interventions/Progress Updates:    Pt received in bed with his SO [name] present.  [name] explained that he was pretty wiped out due to his diarrhea. It has been getting better. Prior to rehab tech arriving, discussed with [facility] bed mobility and she may want to try having him get up on the L side of the bed so he can assist with pushing up with L arm.   His shorts were donning with pt working on bridging with a great deal of cuing and EXTRA time as his initiation was very limited this session. WIth total A pt rolled to L (had pt hold R elbow with L hand, bend both knees) He was not able to keep attention on holding his R arm.   Rehab tech arrived.  Once pt on his L side, tried to have him use L arm to help push up to sit. No initiation. Showed [name], how to facilitate by pulling his knees in then where to place her hands to have him come into sit. This will need to be practiced if they decide to go this direction.  Once sitting, he needed MAX - TOTAL A as he was leaning back and to the side.  Attempted stedy stands 3x with +3 A (OT, tech, SO), but pt not able to initiate. He was leaning over the bar and it was not safe to resume.  [name] stated he was not functioning at his regular level that he has been at this week. She thought he was just too tired. [place] also had a scowl on his face as he did not look very pleased with being asked to do this activity. No verbalization from patient.    Pt returned to supine. Showed shannon supine torso twists she can facilitate with [place]'s knees bent. Then moved bed into full upright sitting position.  Positioned pillows under arms to reduce R  sh subluxation.    Stopped session early as pt not able to participate. Pt not initiation due to fatigue. Pt resting in bed with [name] and other family member present.    Therapy Documentation Precautions:  Precautions Precautions: Fall Precaution Booklet Issued: No Precaution Comments: PEG with binder Restrictions Weight Bearing Restrictions: No  General OT Amount of Missed Time: 30 Minutes Vital Signs: Therapy Vitals Temp: 98.3 F (36.8 C) Pulse Rate: (!) 106 Resp: 17 BP: 139/89 Patient Position (if appropriate): Lying Oxygen Therapy SpO2: 95 % Pain: Pain Assessment Faces Pain Scale: No hurt Agitated Behavior Scale: TBI Observation Details Observation Environment: pt room Start of observation period - Date: 06/27/22 Start of observation period - Time: 1415 End of observation period - Date: 06/27/22 End of observation period - Time: 1500 Agitated Behavior Scale (DO NOT LEAVE BLANKS) Short attention span, easy distractibility, inability to concentrate: Present to a moderate degree Impulsive, impatient, low tolerance for pain or frustration: Present to a slight degree Uncooperative, resistant to care, demanding: Absent Violent and/or threatening violence toward people or property: Absent Explosive and/or unpredictable anger: Absent Rocking, rubbing, moaning, or other self-stimulating behavior: Absent Pulling at tubes, restraints, etc.: Absent Wandering from treatment areas: Absent Restlessness, pacing, excessive movement: Absent Repetitive behaviors, motor, and/or verbal: Absent  Rapid, loud, or excessive talking: Absent Sudden changes of mood: Absent Easily initiated or excessive crying and/or laughter: Absent Self-abusiveness, physical and/or verbal: Absent Agitated behavior scale total score: 17  ADL: ADL Eating: Unable to assess (PEG) Grooming: Dependent Where Assessed-Grooming: Bed level Upper Body Bathing: Dependent Where Assessed-Upper Body Bathing:  Bed level Lower Body Bathing: Dependent Where Assessed-Lower Body Bathing: Bed level Upper Body Dressing: Dependent Where Assessed-Upper Body Dressing: Bed level Lower Body Dressing: Dependent Where Assessed-Lower Body Dressing: Bed level Toileting: Dependent Where Assessed-Toileting: Bed level Toilet Transfer: Unable to assess Tub/Shower Transfer: Unable to assess ADL Comments: Grossly dependent at bedlevel. Unable to awaken during bedlevel ADLs at time of eval.   Therapy/Group: Individual Therapy  Keerat Denicola 06/27/2022, 3:32 PM

## 2022-06-28 LAB — GASTROINTESTINAL PANEL BY PCR, STOOL (REPLACES STOOL CULTURE)

## 2022-06-28 LAB — C DIFFICILE (CDIFF) QUICK SCRN (NO PCR REFLEX)
C Diff antigen: POSITIVE — AB
C Diff toxin: NEGATIVE

## 2022-06-28 LAB — CLOSTRIDIUM DIFFICILE BY PCR, REFLEXED: Toxigenic C. Difficile by PCR: POSITIVE — AB

## 2022-06-28 MED ORDER — VANCOMYCIN 50 MG/ML ORAL SOLUTION
125.0000 mg | Freq: Four times a day (QID) | ORAL | Status: DC
  Filled 2022-06-28 (×3): qty 2.5

## 2022-06-28 MED ORDER — VANCOMYCIN 50 MG/ML ORAL SOLUTION: 125.0000 mg | Freq: Four times a day (QID) | ORAL | Status: DC

## 2022-06-28 MED ORDER — BACLOFEN 10 MG PO TABS
10.0000 mg | ORAL_TABLET | Freq: Four times a day (QID) | ORAL | Status: DC | PRN
  Administered 2022-06-28: 10 mg
  Filled 2022-06-28: qty 1

## 2022-06-28 MED ORDER — VANCOMYCIN 50 MG/ML ORAL SOLUTION
125.0000 mg | Freq: Four times a day (QID) | ORAL | Status: DC
  Administered 2022-06-28 – 2022-06-29 (×6): 125 mg
  Filled 2022-06-28 (×8): qty 2.5

## 2022-06-28 NOTE — Progress Notes (Signed)
Speech Language Pathology TBI Note  Patient Details  Name: Carl Carpenter MRN: [id number] Date of Birth: [birth year]  Today's Date: 06/28/2022 SLP Individual Time: 1415-1450 SLP Individual Time Calculation (min): 35 min  Short Term Goals: Week 8:    Skilled Therapeutic Interventions: SLP intervention focused on dysphagia and cognition. Consulted with RN prior to session regarding mother giving patient ice chips.  Pt seated upright in chair position in bed upon ST arrival. Oral care completed at beginning of session.Pt following instructions to complete oral care with suction sponge. He was given mod A from st. Pt tolerated 3/6 ice chips. Delayed cough noted after swallow with ice chips. Decreased labial seal demonstrated with ice chip falling out of mouth x2. He utilized thumbs up gesture to communicate he wanted more ice chips. Pt answered simple orientation/problem solving  questions using calender. Mod A to state date of discharge. He was able to recall family members birthdays in July and august with min A. Cont with therapy per plan of care. Family reminded of mbss tomorrow.      Pain Pain Assessment Pain Scale: Faces Pain Score: 0-No pain Faces Pain Scale: No hurt  Agitated Behavior Scale: TBI Observation Details Observation Environment: pt room Start of observation period - Date: 06/28/22 Start of observation period - Time: 1415 End of observation period - Date: 06/28/22 End of observation period - Time: 1450 Agitated Behavior Scale (DO NOT LEAVE BLANKS) Short attention span, easy distractibility, inability to concentrate: Present to a moderate degree Impulsive, impatient, low tolerance for pain or frustration: Absent Uncooperative, resistant to care, demanding: Absent Violent and/or threatening violence toward people or property: Absent Explosive and/or unpredictable anger: Absent Rocking, rubbing, moaning, or other self-stimulating behavior: Absent Pulling at tubes,  restraints, etc.: Absent Wandering from treatment areas: Absent Restlessness, pacing, excessive movement: Absent Repetitive behaviors, motor, and/or verbal: Absent Rapid, loud, or excessive talking: Absent Sudden changes of mood: Absent Easily initiated or excessive crying and/or laughter: Absent Self-abusiveness, physical and/or verbal: Absent Agitated behavior scale total score: 16  Therapy/Group: Individual Therapy  Carlean Jews Mariah Gerstenberger 06/28/2022, 3:05 PM

## 2022-06-28 NOTE — Progress Notes (Signed)
   06/28/22 1422  Assess: MEWS Score  Temp 97.9 F (36.6 C)  BP (!) 132/92  Pulse Rate (!) 116  Resp 20  SpO2 95 %  Assess: MEWS Score  MEWS Temp 0  MEWS Systolic 0  MEWS Pulse 2  MEWS RR 0  MEWS LOC 0  MEWS Score 2  MEWS Score Color Yellow  Assess: if the MEWS score is Yellow or Red  Were vital signs taken at a resting state? Yes  Focused Assessment No change from prior assessment  Does the patient meet 2 or more of the SIRS criteria? No  Does the patient have a confirmed or suspected source of infection? No  Provider and Rapid Response Notified? Yes  MEWS guidelines implemented *See Row Information* Yes  Treat  MEWS Interventions Escalated (See documentation below)  Pain Scale Faces  Pain Score 0  Take Vital Signs  Increase Vital Sign Frequency  Yellow: Q 2hr X 2 then Q 4hr X 2, if remains yellow, continue Q 4hrs  Escalate  MEWS: Escalate Yellow: discuss with charge nurse/RN and consider discussing with provider and RRT  Notify: Charge Nurse/RN  Name of Charge Nurse/RN Notified [place] RN  Date Charge Nurse/RN Notified 06/28/22  Time Charge Nurse/RN Notified 1432  Notify: Provider  Provider Name/Title Dr. Larna Daughters  Date Provider Notified 06/28/22  Time Provider Notified 1430  Method of Notification Call  Notification Reason Other (Comment) (MEWs protocol pt HR running tachy)  Provider response No new orders  Date of Provider Response 06/28/22  Time of Provider Response 1430  Document  Patient Outcome Other (Comment) (pt remains stable)  Progress note created (see row info) Yes  Assess: SIRS CRITERIA  SIRS Temperature  0  SIRS Pulse 1  SIRS Respirations  0  SIRS WBC 0  SIRS Score Sum  1     [place]

## 2022-06-28 NOTE — Progress Notes (Addendum)
PROGRESS NOTE   Subjective/Complaints: Numerous liquid stool now has flexiseal tube  Per Mom had abd pain last noc  Pt has limited communication  (occ words or phrases) due to severe diffuse TBI  ROS: Limited due to cognitive/behavioral    Objective:   DG Abd 1 View  Result Date: 06/27/2022 CLINICAL DATA:  Diarrhea. EXAM: ABDOMEN - 1 VIEW COMPARISON:  June 03, 2022. FINDINGS: Tip of the gastrostomy tube projects just right of the lower thoracic spine. Nonspecific bowel gas pattern with gas in colon a nonspecific paucity of small bowel gas. No significant stool burden identified. No renal calculi identified. Levocurvature of the lumbar spine. Right lower quadrant clips. IMPRESSION: Nonspecific bowel gas pattern with gas in colon a nonspecific paucity of small bowel gas. No significant stool burden identified. Electronically Signed   By: Feliberto Harts M.D.   On: 06/27/2022 12:21   Recent Labs    06/27/22 0532  WBC 11.8*  HGB 15.4  HCT 43.5  PLT 252         Recent Labs    06/27/22 0532  NA 142  K 4.3  CL 112*  CO2 22  GLUCOSE 113*  BUN 16  CREATININE 0.78  CALCIUM 10.1           Intake/Output Summary (Last 24 hours) at 06/28/2022 1028 Last data filed at 06/28/2022 0600 Gross per 24 hour  Intake 1424 ml  Output 1200 ml  Net 224 ml            Physical Exam: Vital Signs Blood pressure (!) 129/94, pulse 92, temperature 98 F (36.7 C), resp. rate 20, height 6\' 5"  (1.956 m), weight 100.8 kg, SpO2 97 %.   General: No acute distress Mood and affect are appropriate Heart: Regular rate and rhythm no rubs murmurs or extra sounds Lungs: Clear to auscultation, breathing unlabored, no rales or wheezes Abdomen: Positive bowel sounds, soft nontender to palpation, nondistended G tube site CDI  Extremities: No clubbing, cyanosis, or edema Skin: No evidence of breakdown, no evidence of rash   Skin: No  evidence of breakdown, no evidence of rash Neurologic:   initiating speech with cues,  but very dysarthric at times.  Tended to right side with verbal cueing today, perhaps a little more easily. Watched him sit-stand transfer in steady with min-mod assist.  Extends LLE 5/5 to command . 4/5 grossly on Right with cueing. Mild flex/ext tone right UE/LE which is easily broken with PROM--Musculoskeletal: no jt swelling    Assessment/Plan: 1. Functional deficits which require 3+ hours per day of interdisciplinary therapy in a comprehensive inpatient rehab setting. Physiatrist is providing close team supervision and 24 hour management of active medical problems listed below. Physiatrist and rehab team continue to assess barriers to discharge/monitor patient progress toward functional and medical goals  Care Tool:  Bathing    Body parts bathed by patient: Face, Chest, Left upper leg, Left lower leg   Body parts bathed by helper: Right arm, Left arm, Abdomen, Front perineal area, Buttocks, Right lower leg, Right upper leg     Bathing assist Assist Level: 2 Helpers     Upper Body Dressing/Undressing Upper body dressing  What is the patient wearing?: Pull over shirt    Upper body assist Assist Level: Maximal Assistance - Patient 25 - 49%    Lower Body Dressing/Undressing Lower body dressing      What is the patient wearing?: Pants, Incontinence brief     Lower body assist Assist for lower body dressing: 2 Helpers     Toileting Toileting    Toileting assist Assist for toileting: 2 Helpers     Transfers Chair/bed transfer  Transfers assist  Chair/bed transfer activity did not occur: Safety/medical concerns  Chair/bed transfer assist level: Total Assistance - Patient < 25%     Locomotion Ambulation   Ambulation assist   Ambulation activity did not occur: Safety/medical concerns          Walk 10 feet activity   Assist  Walk 10 feet activity did not occur:  Safety/medical concerns        Walk 50 feet activity   Assist Walk 50 feet with 2 turns activity did not occur: Safety/medical concerns         Walk 150 feet activity   Assist Walk 150 feet activity did not occur: Safety/medical concerns         Walk 10 feet on uneven surface  activity   Assist Walk 10 feet on uneven surfaces activity did not occur: Safety/medical concerns         Wheelchair     Assist Is the patient using a wheelchair?: Yes Type of Wheelchair: Manual    Wheelchair assist level: Dependent - Patient 0%      Wheelchair 50 feet with 2 turns activity    Assist        Assist Level: Dependent - Patient 0%   Wheelchair 150 feet activity     Assist      Assist Level: Dependent - Patient 0%   Blood pressure (!) 129/94, pulse 92, temperature 98 F (36.7 C), resp. rate 20, height 6\' 5"  (1.956 m), weight 100.8 kg, SpO2 97 %.    Medical Problem List and Plan: 1. 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  DVT right posterior tibial and peroneal veins (03/22/22): Discussed with mother that repeat doppler shows no new clots and resolution of one previous clot.  -anticoagulation: continue Eliquis             -antiplatelet therapy: none 3. Pain: continueTylenol prn 4. Behavior/sleep: LCSW to evaluate and provide emotional support             -antipsychotic agents: none  - propranolol for restlessness  -  low dose seroquel at night for restlessness/agitation  -celexa 10mg  qhs for depression/irritability  5. Impaired attention/cognition: This patient is not capable of making decisions on his own behalf.             7/7 continued improvement. Speaking and  initiating more   -maintain ritalin at 20mg  bid, maintain above changes    6. Dysphagia. S/p PEG 3/29.  --NPO -continue TF/boluses -Continue zinc oxide to protect skin surrounding PEG tube -5/30 replaced PEG under fluoro by IR-  PEG tube looks ok 7. Fluids/Electrolytes/Nutrition: Routine Is and Os and follow-up chemistries             --continue tube feeds Jevity 1.5 boluses             --continue Prosource             -  gaining weight  7/7- weights improving Filed Weights   06/25/22 0443 06/27/22 0600 06/28/22 0547  Weight: 107 kg 100.5 kg 100.8 kg    9: C1 right transverse process fracture--he's out of collar 10: Left Occipital condyle fracture--out of collar 11: Acute ventilator respiratory failure requiring trach and PEG 3/29              --decannulated , stoma closed            -resumed robinul and robitussin via PEG    -recent cxr clear.    -tachycardia- much improved with robinul/schedule robitussin on board  -managing secretions better 12: Right ear laceration:  healing 14: Tongue laceration: monitor healing 15: Hypertension/tachycardia:        -   propranolol increased to 40mg  qid  7/7 Improved control     06/28/2022    5:47 AM 06/27/2022    8:08 PM 06/27/2022    4:33 PM  Vitals with BMI  Weight 222 lbs 4 oz    Systolic 129 129 08/28/2022  Diastolic 94 94 90  Pulse 92 105 106    16. Hypoglycemic to 69: otherwise in 90s: will d/c ISS since not requiring   18. Iron deficiency:  conitnue daily iron supplement  19. Tremor,?clonus: continue propranolol 40mg  qid 20, Constipation:  7/7---now with loose stools x 24 hours; no odor/fever  -hold senna-s -imodium prn  22. Slightly Elevated Ammonia: resolved  -potentially d/t vpa 23. Leukocytosis:   resolved  24.  Loose stools would expect with TF, no change in formula, was having constipation but senna stopped when diarrhea started.  No meds that typically cause diarrhea, no recent abx.  Will check KUB to see if there is a stool  ball in colon.  K+ ok , check Mg++ GI panel pending add C diff   Discussed with Dr 262   LOS: 51 days A FACE TO FACE EVALUATION WAS PERFORMED  06/28/2022, 10:28 AM

## 2022-06-28 NOTE — Progress Notes (Signed)
Physical Therapy Session Note  Patient Details  Name: Carl Carpenter MRN: [id number] Date of Birth: 1986/08/01  Today's Date: 06/28/2022 PT Individual Time: 1005-1120 PT Individual Time Calculation (min): 75 min   Short Term Goals: Week 7:  PT Short Term Goal 1 (Week 7): Pt will complete bed mobility with modA +1. PT Short Term Goal 2 (Week 7): Pt will complete bed to chair transfer with modA +1. PT Short Term Goal 3 (Week 7): Pt will ambulate 100' with modA +2 and LRAD.  Skilled Therapeutic Interventions/Progress Updates:     Patient in bed asleep with his mother at bedside upon PT arrival. Patient opened his eyes x2 to verbal and tactile stimulation then immediately returned to sleep with limited arousal this morning. Patient's nurse and mother report bowl frequency and loose stool all day yesterday. His mother reports that he did sit EOB with her last night, but otherwise has been too fatigued for any other mobility. MD arrived for rounds. PT stepped out to allow MD to assess the patient and due to limited arousal by patient. LPN found PT <5 min later stating the patient called for PT by name and was more aroused following MD assessment. Returned to patient's room and patient agreeable to PT session. Patient denied pain during session.  Initiated session by having the rectal tube examined by nursing staff due to leakage. PT utilized therapeutic use of self to talk patient through what was bing done to fix the tube and provide warning for increased insertion. Provided cues for diaphragmatic breathing with patient following cues x5/5 trials.    Therapeutic Activity: Bed Mobility: Patient performed rolling R with use of bed rail with hand-over-hand assist for L hand on rail with mod A of 1-2. Provided cues for bending B lower extremities and turning his head to the R to assist with mobility. He performed supine to/from sit with mod A and increased time for initiation and motor planning. Provided  verbal cues for rolling through side-lying, bending his legs to bringing them off/on the bed, and use of his bottom, R, arm to use his elbow for improved trunk control. Upon returning to lying, patient performed scooting up in the bed with bed in trendelenburg and use of bed rail on head of bed with min A +2 and max cues for hand and leg placement.    Neuromuscular Re-ed: Patient performed the following sitting balance and standing balance and lower extremity motor control activities: -sitting balance >5 min focused on midline orientation, increased support with R upper extremity with approximation through hand and elbow initially progressing to no approximation on therapist knee beside patient; fluctuated between max and min A for sitting balance, progressed to reaching to the floor and reaching forward for his mother   -sit to stand in the Paint x12 focused on increased gluteal and quad activation for increased hip/knee extension R>L and visual target for trunk extension, progressed from the [place] bar to holding the TIS w/c handles for increased hand height to promote trunk extension in standing; performed x8 with min A of 1 person with his mother as a visual target and motivation, progressed to a hug x1 in standing, required mod-max A of 1 person with min A from a second for the final 4 stands due to increased trunk and R knee flexion with significant R lean with fatigue, patient initiated stands despite therapist's cues to rest and education on signs of fatigue, patient finally agreeable to rest following last stand  Patient in bed with rectal tube intact and his mother at bedside at end of session with breaks locked, bed alarm set, and all needs within reach. Patient stated "I'm thirsty" several times at end of session. Educated patient on upcoming barium swallow study tomorrow and encouraged him to focus during the test. [name] reports that she will perform oral care to assist with any dry  mouth the patient may be experiencing.  Therapy Documentation Precautions:  Precautions Precautions: Fall Precaution Booklet Issued: No Precaution Comments: PEG with binder Restrictions Weight Bearing Restrictions: No    Therapy/Group: Individual Therapy  Jonas Goh L Brin Ruggerio  PT, DPT, NCS, CBIS  06/28/2022, 1:54 PM

## 2022-06-29 ENCOUNTER — Inpatient Hospital Stay (HOSPITAL_COMMUNITY): Payer: Medicaid Other

## 2022-06-29 DIAGNOSIS — Z7901 Long term (current) use of anticoagulants: Secondary | ICD-10-CM

## 2022-06-29 DIAGNOSIS — Z87820 Personal history of traumatic brain injury: Secondary | ICD-10-CM

## 2022-06-29 DIAGNOSIS — I10 Essential (primary) hypertension: Secondary | ICD-10-CM

## 2022-06-29 DIAGNOSIS — R739 Hyperglycemia, unspecified: Secondary | ICD-10-CM

## 2022-06-29 DIAGNOSIS — E162 Hypoglycemia, unspecified: Secondary | ICD-10-CM

## 2022-06-29 DIAGNOSIS — R4189 Other symptoms and signs involving cognitive functions and awareness: Secondary | ICD-10-CM

## 2022-06-29 DIAGNOSIS — I82441 Acute embolism and thrombosis of right tibial vein: Secondary | ICD-10-CM

## 2022-06-29 DIAGNOSIS — A0472 Enterocolitis due to Clostridium difficile, not specified as recurrent: Secondary | ICD-10-CM

## 2022-06-29 DIAGNOSIS — G479 Sleep disorder, unspecified: Secondary | ICD-10-CM

## 2022-06-29 DIAGNOSIS — R000 Tachycardia, unspecified: Secondary | ICD-10-CM

## 2022-06-29 DIAGNOSIS — I82451 Acute embolism and thrombosis of right peroneal vein: Secondary | ICD-10-CM

## 2022-06-29 DIAGNOSIS — R1310 Dysphagia, unspecified: Secondary | ICD-10-CM

## 2022-06-29 DIAGNOSIS — D509 Iron deficiency anemia, unspecified: Secondary | ICD-10-CM

## 2022-06-29 LAB — CBC WITH DIFFERENTIAL/PLATELET
Abs Immature Granulocytes: 0.07 10*3/uL (ref 0.00–0.07)
Basophils Absolute: 0.1 10*3/uL (ref 0.0–0.1)
Basophils Relative: 1 %
Eosinophils Absolute: 0.1 10*3/uL (ref 0.0–0.5)
Eosinophils Relative: 1 %
HCT: 47.9 % (ref 39.0–52.0)
Hemoglobin: 16.5 g/dL (ref 13.0–17.0)
Immature Granulocytes: 1 %
Lymphocytes Relative: 21 %
Lymphs Abs: 3.1 10*3/uL (ref 0.7–4.0)
MCH: 31.5 pg (ref 26.0–34.0)
MCHC: 34.4 g/dL (ref 30.0–36.0)
MCV: 91.4 fL (ref 80.0–100.0)
Monocytes Absolute: 0.4 10*3/uL (ref 0.1–1.0)
Monocytes Relative: 3 %
Neutro Abs: 10.8 10*3/uL — ABNORMAL HIGH (ref 1.7–7.7)
Neutrophils Relative %: 73 %
Platelets: 273 10*3/uL (ref 150–400)
RBC: 5.24 MIL/uL (ref 4.22–5.81)
RDW: 12.6 % (ref 11.5–15.5)
WBC: 14.5 10*3/uL — ABNORMAL HIGH (ref 4.0–10.5)
nRBC: 0 % (ref 0.0–0.2)

## 2022-06-29 LAB — BASIC METABOLIC PANEL
Anion gap: 13 (ref 5–15)
BUN: 20 mg/dL (ref 6–20)
CO2: 20 mmol/L — ABNORMAL LOW (ref 22–32)
Calcium: 10.3 mg/dL (ref 8.9–10.3)
Chloride: 111 mmol/L (ref 98–111)
Creatinine, Ser: 0.82 mg/dL (ref 0.61–1.24)
GFR, Estimated: >60 mL/min (ref >60)
Glucose, Bld: 236 mg/dL — ABNORMAL HIGH (ref 70–99)
Potassium: 4 mmol/L (ref 3.5–5.1)
Sodium: 144 mmol/L (ref 135–145)

## 2022-06-29 MED ORDER — SODIUM CHLORIDE 0.9 % IV SOLN: INTRAVENOUS | Status: DC

## 2022-06-29 MED ORDER — PROPRANOLOL HCL 20 MG/5ML PO SOLN
50.0000 mg | Freq: Four times a day (QID) | ORAL | Status: DC
  Administered 2022-06-29 (×2): 50 mg
  Filled 2022-06-29 (×5): qty 12.5

## 2022-06-29 MED ORDER — METHYLPHENIDATE HCL 5 MG PO TABS: 15.0000 mg | ORAL_TABLET | Freq: Two times a day (BID) | ORAL | Status: DC

## 2022-06-29 NOTE — Progress Notes (Incomplete Revision)
Called regarding MEWS score red. Patient is tachycardic at 135. No perceived chest pain or palpitations, but limited to due cognition. Nursing reports increase in cough and sputum production. Afebrile. BP 140/89. Getting IVFs at 100cc/hr. Being treated for C.diff with vancomycin.      Latest Ref Rng & Units 06/29/2022    8:05 AM 06/27/2022    5:32 AM 06/22/2022    7:36 AM  BMP  Glucose 70 - 99 mg/dL 412  878  95   BUN 6 - 20 mg/dL 20  16  14    Creatinine 0.61 - 1.24 mg/dL  6.76  7.20   Sodium 135 - 145 mmol/L 144  142  137   Potassium 3.5 - 5.1 mmol/L 4.0  4.3  3.8   Chloride 98 - 111 mmol/L 111  112  103   CO2 22 - 32 mmol/L 20  22  28    Calcium 8.9 - 10.3 mg/dL 9.47   9.5        Latest Ref Rng & Units 06/29/2022    8:05 AM 06/27/2022    5:32 AM 06/23/2022    5:28 AM  CBC  WBC 4.0 - 10.5 K/uL 14.5  11.8  8.9   Hemoglobin 13.0 - 17.0 g/dL 08/28/2022  08/24/2022  66.2   Hematocrit 39.0 - 52.0 % 47.9  43.5  37.3   Platelets 150 - 400 K/uL 273  252  182      Will check chest x-ray and EKG.  EKG = sinus tach  Narrative & Impression  CLINICAL DATA:  Cough for 1 day   EXAM: PORTABLE CHEST 1 VIEW   COMPARISON:  06/14/2022   FINDINGS: The heart size and mediastinal contours are within normal limits. Both lungs are clear. The visualized skeletal structures are unremarkable.   IMPRESSION: No active disease.     Electronically Signed   By: 65.4 M.D.   On: 06/29/2022 13:45

## 2022-06-29 NOTE — Progress Notes (Signed)
Occupational Therapy Note  Patient Details  Name: Carl Carpenter MRN: [id number] Date of Birth: 1986/12/13  Today's Date: 06/29/2022 OT Missed Time: 45 Minutes Missed Time Reason: MD hold (comment)  Pt supine and sleeping. Spoke with pt's father [name] who reports [name] is wiped out from frequent loose stools. Pt difficult to awaken. HR resting at 135 bpm. Consulted with MD and RN who recommend holding therapy until labs come back. 45 min skilled OT missed. Will attempt to make up as schedule allows.    Crissie Reese 06/29/2022, 9:31 AM

## 2022-06-29 NOTE — Progress Notes (Signed)
Called regarding MEWS score red. Patient is tachycardic at 135. No perceived chest pain or palpitations, but limited to due cognition. Nursing reports increase in cough and sputum production. Afebrile. BP 140/89. Getting IVFs at 100cc/hr. Being treated for C.diff with vancomycin.      Latest Ref Rng & Units 06/29/2022    8:05 AM 06/27/2022    5:32 AM 06/22/2022    7:36 AM  BMP  Glucose 70 - 99 mg/dL 932  355  95   BUN 6 - 20 mg/dL 20  16  14    Creatinine 0.61 - 1.24 mg/dL  7.32  2.02   Sodium 135 - 145 mmol/L 144  142  137   Potassium 3.5 - 5.1 mmol/L 4.0  4.3  3.8   Chloride 98 - 111 mmol/L 111  112  103   CO2 22 - 32 mmol/L 20  22  28    Calcium 8.9 - 10.3 mg/dL 5.42   9.5        Latest Ref Rng & Units 06/29/2022    8:05 AM 06/27/2022    5:32 AM 06/23/2022    5:28 AM  CBC  WBC 4.0 - 10.5 K/uL 14.5  11.8  8.9   Hemoglobin 13.0 - 17.0 g/dL 08/28/2022  08/24/2022  62.8   Hematocrit 39.0 - 52.0 % 47.9  43.5  37.3   Platelets 150 - 400 K/uL 273  252  182      Will check chest x-ray and EKG.  EKG = sinus tach  Narrative & Impression  CLINICAL DATA:  Cough for 1 day   EXAM: PORTABLE CHEST 1 VIEW   COMPARISON:  06/14/2022   FINDINGS: The heart size and mediastinal contours are within normal limits. Both lungs are clear. The visualized skeletal structures are unremarkable.   IMPRESSION: No active disease.     Electronically Signed   By: 17.6 M.D.   On: 06/29/2022 13:45

## 2022-06-29 NOTE — Progress Notes (Signed)
PROGRESS NOTE   Subjective/Complaints: Continued loose stool. PCR + for C diff. Last stool recorded at 0000. Pt able to sleep per dad. No other issues this morning. Pt was asleep when I came in  ROS: Limited due to cognitive/behavioral    Objective:   DG Abd 1 View  Result Date: 06/27/2022 CLINICAL DATA:  Diarrhea. EXAM: ABDOMEN - 1 VIEW COMPARISON:  June 03, 2022. FINDINGS: Tip of the gastrostomy tube projects just right of the lower thoracic spine. Nonspecific bowel gas pattern with gas in colon a nonspecific paucity of small bowel gas. No significant stool burden identified. No renal calculi identified. Levocurvature of the lumbar spine. Right lower quadrant clips. IMPRESSION: Nonspecific bowel gas pattern with gas in colon a nonspecific paucity of small bowel gas. No significant stool burden identified. Electronically Signed   By: Feliberto Harts M.D.   On: 06/27/2022 12:21   Recent Labs    06/27/22 0532  WBC 11.8*  HGB 15.4  HCT 43.5  PLT 252         Recent Labs    06/27/22 0532  NA 142  K 4.3  CL 112*  CO2 22  GLUCOSE 113*  BUN 16  CREATININE 0.78  CALCIUM 10.1          Intake/Output Summary (Last 24 hours) at 06/29/2022 8101 Last data filed at 06/29/2022 0500 Gross per 24 hour  Intake 1274 ml  Output 1150 ml  Net 124 ml           Physical Exam: Vital Signs Blood pressure 121/87, pulse 97, temperature 98.2 F (36.8 C), resp. rate 15, height 6\' 5"  (1.956 m), weight 100.7 kg, SpO2 96 %.   Constitutional: No distress . Vital signs reviewed. HEENT: NCAT, EOMI, oral membranes moist Neck: supple Cardiovascular: RRR without murmur. No JVD    Respiratory/Chest: CTA Bilaterally without wheezes or rales. Normal effort    GI/Abdomen: BS +, non-tender, non-distended, PEG site clean Ext: no clubbing, cyanosis, or edema Psych: pleasant and cooperative  Skin: No evidence of breakdown, no evidence  of rash Neurologic:   initiating speech with cues,  but very dysarthric at times.  Tended to right side with verbal cueing today, perhaps a little more easily. Watched him sit-stand transfer in steady with min-mod assist.  Extends LLE 5/5 to command . 4/5 grossly on Right with cueing. Mild flex/ext tone right UE/LE which is easily broken with PROM--Musculoskeletal: no jt swelling    Assessment/Plan: 1. Functional deficits which require 3+ hours per day of interdisciplinary therapy in a comprehensive inpatient rehab setting. Physiatrist is providing close team supervision and 24 hour management of active medical problems listed below. Physiatrist and rehab team continue to assess barriers to discharge/monitor patient progress toward functional and medical goals  Care Tool:  Bathing    Body parts bathed by patient: Face, Chest, Left upper leg, Left lower leg   Body parts bathed by helper: Right arm, Left arm, Abdomen, Front perineal area, Buttocks, Right lower leg, Right upper leg     Bathing assist Assist Level: 2 Helpers     Upper Body Dressing/Undressing Upper body dressing   What is the patient wearing?: Pull  over shirt    Upper body assist Assist Level: Maximal Assistance - Patient 25 - 49%    Lower Body Dressing/Undressing Lower body dressing      What is the patient wearing?: Pants, Incontinence brief     Lower body assist Assist for lower body dressing: 2 Helpers     Toileting Toileting    Toileting assist Assist for toileting: 2 Helpers     Transfers Chair/bed transfer  Transfers assist  Chair/bed transfer activity did not occur: Safety/medical concerns  Chair/bed transfer assist level: Total Assistance - Patient < 25%     Locomotion Ambulation   Ambulation assist   Ambulation activity did not occur: Safety/medical concerns          Walk 10 feet activity   Assist  Walk 10 feet activity did not occur: Safety/medical concerns        Walk  50 feet activity   Assist Walk 50 feet with 2 turns activity did not occur: Safety/medical concerns         Walk 150 feet activity   Assist Walk 150 feet activity did not occur: Safety/medical concerns         Walk 10 feet on uneven surface  activity   Assist Walk 10 feet on uneven surfaces activity did not occur: Safety/medical concerns         Wheelchair     Assist Is the patient using a wheelchair?: Yes Type of Wheelchair: Manual    Wheelchair assist level: Dependent - Patient 0%      Wheelchair 50 feet with 2 turns activity    Assist        Assist Level: Dependent - Patient 0%   Wheelchair 150 feet activity     Assist      Assist Level: Dependent - Patient 0%   Blood pressure 121/87, pulse 97, temperature 98.2 F (36.8 C), resp. rate 15, height 6\' 5"  (1.956 m), weight 100.7 kg, SpO2 96 %.    Medical Problem List and Plan: 1. 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  DVT right posterior tibial and peroneal veins (03/22/22): Discussed with mother that repeat doppler shows no new clots and resolution of one previous clot.  -anticoagulation: continue Eliquis             -antiplatelet therapy: none 3. Pain: continueTylenol prn 4. Behavior/sleep: LCSW to evaluate and provide emotional support             -antipsychotic agents: none  - propranolol for restlessness  -  low dose seroquel at night for restlessness/agitation  -celexa 10mg  qhs for depression/irritability  5. Impaired attention/cognition: This patient is not capable of making decisions on his own behalf.             - continued improvement. Speaking and initiating  more   -maintain ritalin at 20mg  bid, maintain above changes    6. Dysphagia. S/p PEG 3/29.  --NPO -continue TF/boluses -Continue zinc oxide to protect skin surrounding PEG tube -5/30 replaced PEG under fluoro by IR- PEG  tube looks ok 7. Fluids/Electrolytes/Nutrition: Routine Is and Os and follow-up chemistries             --continue tube feeds Jevity 1.5 boluses             --continue Prosource             -weights trending up  -7/10 labs pending today Filed Weights   06/27/22 0600 06/28/22 0547 06/29/22 0500  Weight: 100.5 kg 100.8 kg 100.7 kg    9: C1 right transverse process fracture--he's out of collar 10: Left Occipital condyle fracture--out of collar 11: Acute ventilator respiratory failure requiring trach and PEG 3/29              --decannulated , stoma closed            -resumed robinul and robitussin via PEG    -recent cxr clear.    -tachycardia- much improved with robinul/schedule robitussin on board  -managing secretions better 12: Right ear laceration:  healing 14: Tongue laceration: monitor healing 15: Hypertension/tachycardia:        -   propranolol increased to 40mg  qid  - Improved control     06/29/2022    5:22 AM 06/29/2022    5:00 AM 06/29/2022    2:34 AM  Vitals with BMI  Weight  [id number] lbs   Systolic [id number]  [id number]  Diastolic 87  83  Pulse 97  98    16. Hypoglycemic to 69: otherwise in 90s: will d/c ISS since not requiring   18. Iron deficiency:  conitnue daily iron supplement  19. Tremor,?clonus: continue propranolol 40mg  qid 22. Slightly Elevated Ammonia: resolved  -potentially d/t vpa 23.  C Diff diarrhea: has been on 125mg  van sol'n per PEG since yesterday afternoon  -last stool at 0000 today  -will add probiotic  -labs pending as above.   -treat supportively   LOS: 52 days A FACE TO FACE EVALUATION WAS PERFORMED  Meredith Staggers 06/29/2022, 8:23 AM

## 2022-06-29 NOTE — Progress Notes (Signed)
Physical Therapy Session Note  Patient Details  Name: Carl Carpenter MRN: [id number] Date of Birth: March 03, 1986  Today's Date: 06/29/2022 PT Missed Time: 75 Minutes Missed Time Reason: MD hold (Comment) (Resting HR 135)  Short Term Goals: Week 7:  PT Short Term Goal 1 (Week 7): Pt will complete bed mobility with modA +1. PT Short Term Goal 2 (Week 7): Pt will complete bed to chair transfer with modA +1. PT Short Term Goal 3 (Week 7): Pt will ambulate 100' with modA +2 and LRAD.  Skilled Therapeutic Interventions/Progress Updates:     Pt on medical hold today due to resting HR at 135 with decline in status. PT will follow up as able.  Therapy Documentation Precautions:  Precautions Precautions: Fall Precaution Booklet Issued: No Precaution Comments: PEG with binder Restrictions Weight Bearing Restrictions: No General: PT Amount of Missed Time (min): 75 Minutes PT Missed Treatment Reason: MD hold (Comment) (Resting HR 135)     Therapy/Group: Individual Therapy  [name] 06/29/2022, 6:07 PM
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
## 2022-06-29 NOTE — Progress Notes (Signed)
Educated family on infection prevention. When pt's dad came in yesterday pt hadn't tested positive yet. Pt's dad hasn't left the room since he got here yesterday but when he returns tomorrow he will gown up.   Lindie Roberson [place]

## 2022-06-29 NOTE — Progress Notes (Signed)
Occupational Therapy Note  Patient Details  Name: Carl Carpenter MRN: [id number] Date of Birth: [birth year]  Today's Date: 06/29/2022 OT Missed Time: 30 Minutes Missed Time Reason: MD hold (comment)  Pt still with red MEWS, ill appearing and difficult to arouse. MD hold with elevated HR at rest- still at 135 bpm. 30 min skilled OT missed.    Crissie Reese 06/29/2022, 2:16 PM

## 2022-06-29 NOTE — Progress Notes (Signed)
Occupational Therapy TBI Note  Patient Details  Name: Carl Carpenter MRN: [id number] Date of Birth: 08-Feb-1986  Today's Date: 06/29/2022 OT Individual Time: 5188-4166 OT Individual Time Calculation (min): 20 min    Short Term Goals: Week 7:  OT Short Term Goal 1 (Week 7): STG= LTG d/t ELOS  Skilled Therapeutic Interventions/Progress Updates:    Pt received supine, diaphoretic and overall ill-appearing. He was minimally responsive to stimuli and resting HR at 134 bpm, SpO2 at 93%. RN in room throughout session. Did not intend to initiate tx d/t presentation but pt found to be largely incontinent of urine. He was able to maintain grasp on bed rail during rolling for 30 sec before falling asleep. He did respond to pain during rolling R and L. Total A +3 required for bed level care. He sat up in bed for his soiled shirt to be removed with max +2 assist, did demonstrate some trunk control briefly. Pt left supine with SpO2 up to 95% now, weakly coughing with secretions. Medical team all aware of condition. 40 min missed.   Therapy Documentation Precautions:  Precautions Precautions: Fall Precaution Booklet Issued: No Precaution Comments: PEG with binder Restrictions Weight Bearing Restrictions: No General: General OT Amount of Missed Time: 40 Minutes  Agitated Behavior Scale: TBI Observation Details Observation Environment: pt room Start of observation period - Date: 06/29/22 Start of observation period - Time: 1115 End of observation period - Date: 06/29/22 End of observation period - Time: 1140 Agitated Behavior Scale (DO NOT LEAVE BLANKS) Short attention span, easy distractibility, inability to concentrate: Present to a moderate degree Impulsive, impatient, low tolerance for pain or frustration: Absent Uncooperative, resistant to care, demanding: Absent Violent and/or threatening violence toward people or property: Absent Explosive and/or unpredictable anger: Absent Rocking,  rubbing, moaning, or other self-stimulating behavior: Absent Pulling at tubes, restraints, etc.: Absent Wandering from treatment areas: Absent Restlessness, pacing, excessive movement: Absent Repetitive behaviors, motor, and/or verbal: Absent Rapid, loud, or excessive talking: Absent Sudden changes of mood: Absent Easily initiated or excessive crying and/or laughter: Absent Self-abusiveness, physical and/or verbal: Absent Agitated behavior scale total score: 16    Therapy/Group: Individual Therapy  Crissie Reese 06/29/2022, 11:46 AM

## 2022-06-29 NOTE — Progress Notes (Addendum)
Speech Language Pathology TBI Note  Patient Details  Name: Carl Carpenter MRN: [id number] Date of Birth: August 12, 1986  Today's Date: 06/29/2022 SLP Individual Time: 3267-1245 SLP Individual Time Calculation (min): 27 min  Short Term Goals: Week 8: STG"s = LTG's due to ELOS  Skilled Therapeutic Interventions: S: Pt seen this date for deglutition and cognitive-linguistic goals outlined in care plan. Pt received lethargic, diaphoretic, and lying in bed. Per chart review, pt with multiple loose stools; positive for C diff with rectal tube in place. Father at bedside. Pt tachycardic upon arrival, with HR remaining elevated throughout today's session (HR 130-139). MBSS was scheduled for today; however, was canceled due to pt's current medical status. SLP provided low stimulation environment (turn lights off, closed blinds, closed door to hallway, and provided rest breaks). Pt tolerant of therapy.  O: Following Total A for oral care, SLP provided PROM (e.g, Beckman stretches) to oral musculature to facilitate improved sensory awareness, muscle tone, and active movement. Pt tolerant of all exercises. Intermittent cough noted in the absence PO, in addition to wet vocal quality. With Max A multimodal cues, pt attempted to imitate/produce volitional throat clear on ~25% of opportunities, though weak. Following PROM, SLP provided pressure of dry spoon to medial lingual surface and hyoglossal assistance in an effort to facilitate swallow initiation; however, pt with no response despite Max A multimodal cues. SLP did not provide PO trials, this date, due to ongoing tachycardia. Re: cognition, pt followed functional + familiar one-step commands with Mod A multimodal cues. Mod A to participate in imitation of single words and rote speech. Spontaneous speech remains largely unintelligible, outside of unknown context; improved intelligibility, in known context, with Max A for increased vocal intensity.  Answered basic  yes/no questions via head nods/shakes on ~75% of trials. Informally, pt noted to be attending more to R visual field with and without presence of stimulus.  A: Pt remains stimulable for skilled ST intervention; however, medical status continues to be a barrier to progress at times. Pt remains verbal at times, though is now attempting to utilize hand and head gestures > verbalizations. Will continue to monitor for readiness re: MBSS.  P: Pt left in bed with low stim environment in placed. Father remains at bedside. Continue per current ST POC.  Pain No indications of pain  Agitated Behavior Scale: TBI Observation Details Observation Environment: pt room Start of observation period - Date: 06/29/22 Start of observation period - Time: 0908 End of observation period - Date: 06/29/22 End of observation period - Time: 0935 Agitated Behavior Scale (DO NOT LEAVE BLANKS) Short attention span, easy distractibility, inability to concentrate: Present to a moderate degree Impulsive, impatient, low tolerance for pain or frustration: Absent Uncooperative, resistant to care, demanding: Absent Violent and/or threatening violence toward people or property: Absent Explosive and/or unpredictable anger: Absent Rocking, rubbing, moaning, or other self-stimulating behavior: Absent Pulling at tubes, restraints, etc.: Absent Wandering from treatment areas: Absent Restlessness, pacing, excessive movement: Absent Repetitive behaviors, motor, and/or verbal: Absent Rapid, loud, or excessive talking: Absent Sudden changes of mood: Absent Easily initiated or excessive crying and/or laughter: Absent Self-abusiveness, physical and/or verbal: Absent Agitated behavior scale total score: 16  Therapy/Group: Individual Therapy  Kutler Vanvranken A Joetta Delprado 06/29/2022, 4:23 PM

## 2022-06-30 ENCOUNTER — Inpatient Hospital Stay (HOSPITAL_COMMUNITY): Payer: Medicaid Other

## 2022-06-30 ENCOUNTER — Inpatient Hospital Stay (HOSPITAL_COMMUNITY)
Admission: AD | Admit: 2022-06-30 | Discharge: 2022-07-14 | DRG: 871 | Disposition: A | Discharge status: Rehabilitation Facility | Payer: Medicaid Other | Source: Intra-hospital | Attending: Internal Medicine | Admitting: Internal Medicine

## 2022-06-30 ENCOUNTER — Encounter (HOSPITAL_COMMUNITY): Payer: Self-pay | Admitting: Physical Medicine & Rehabilitation

## 2022-06-30 DIAGNOSIS — J690 Pneumonitis due to inhalation of food and vomit: Secondary | ICD-10-CM

## 2022-06-30 DIAGNOSIS — A419 Sepsis, unspecified organism: Principal | ICD-10-CM | POA: Diagnosis present

## 2022-06-30 DIAGNOSIS — R4189 Other symptoms and signs involving cognitive functions and awareness: Secondary | ICD-10-CM | POA: Diagnosis not present

## 2022-06-30 DIAGNOSIS — R451 Restlessness and agitation: Secondary | ICD-10-CM | POA: Diagnosis not present

## 2022-06-30 DIAGNOSIS — Z86718 Personal history of other venous thrombosis and embolism: Secondary | ICD-10-CM | POA: Diagnosis not present

## 2022-06-30 DIAGNOSIS — R6520 Severe sepsis without septic shock: Secondary | ICD-10-CM | POA: Diagnosis present

## 2022-06-30 DIAGNOSIS — A0472 Enterocolitis due to Clostridium difficile, not specified as recurrent: Secondary | ICD-10-CM | POA: Diagnosis present

## 2022-06-30 DIAGNOSIS — F17210 Nicotine dependence, cigarettes, uncomplicated: Secondary | ICD-10-CM | POA: Diagnosis present

## 2022-06-30 DIAGNOSIS — S069X4S Unspecified intracranial injury with loss of consciousness of 6 hours to 24 hours, sequela: Secondary | ICD-10-CM | POA: Diagnosis not present

## 2022-06-30 DIAGNOSIS — E870 Hyperosmolality and hypernatremia: Secondary | ICD-10-CM | POA: Diagnosis present

## 2022-06-30 DIAGNOSIS — S069X9D Unspecified intracranial injury with loss of consciousness of unspecified duration, subsequent encounter: Secondary | ICD-10-CM | POA: Diagnosis not present

## 2022-06-30 DIAGNOSIS — S02113D Unspecified occipital condyle fracture, subsequent encounter for fracture with routine healing: Secondary | ICD-10-CM

## 2022-06-30 DIAGNOSIS — K921 Melena: Secondary | ICD-10-CM | POA: Diagnosis not present

## 2022-06-30 DIAGNOSIS — Z7901 Long term (current) use of anticoagulants: Secondary | ICD-10-CM

## 2022-06-30 DIAGNOSIS — S066XAD Traumatic subarachnoid hemorrhage with loss of consciousness status unknown, subsequent encounter: Secondary | ICD-10-CM | POA: Diagnosis not present

## 2022-06-30 DIAGNOSIS — K0889 Other specified disorders of teeth and supporting structures: Secondary | ICD-10-CM | POA: Diagnosis present

## 2022-06-30 DIAGNOSIS — K649 Unspecified hemorrhoids: Secondary | ICD-10-CM | POA: Diagnosis present

## 2022-06-30 DIAGNOSIS — G9340 Encephalopathy, unspecified: Secondary | ICD-10-CM | POA: Diagnosis not present

## 2022-06-30 DIAGNOSIS — G9341 Metabolic encephalopathy: Secondary | ICD-10-CM | POA: Diagnosis present

## 2022-06-30 DIAGNOSIS — D696 Thrombocytopenia, unspecified: Secondary | ICD-10-CM | POA: Diagnosis not present

## 2022-06-30 DIAGNOSIS — J386 Stenosis of larynx: Secondary | ICD-10-CM | POA: Diagnosis present

## 2022-06-30 DIAGNOSIS — R269 Unspecified abnormalities of gait and mobility: Secondary | ICD-10-CM | POA: Diagnosis not present

## 2022-06-30 DIAGNOSIS — J9601 Acute respiratory failure with hypoxia: Secondary | ICD-10-CM | POA: Diagnosis present

## 2022-06-30 DIAGNOSIS — R569 Unspecified convulsions: Secondary | ICD-10-CM | POA: Diagnosis present

## 2022-06-30 DIAGNOSIS — I10 Essential (primary) hypertension: Secondary | ICD-10-CM | POA: Diagnosis present

## 2022-06-30 DIAGNOSIS — R1319 Other dysphagia: Secondary | ICD-10-CM | POA: Diagnosis present

## 2022-06-30 DIAGNOSIS — F02C3 Dementia in other diseases classified elsewhere, severe, with mood disturbance: Secondary | ICD-10-CM | POA: Diagnosis not present

## 2022-06-30 DIAGNOSIS — Z515 Encounter for palliative care: Secondary | ICD-10-CM

## 2022-06-30 DIAGNOSIS — I82401 Acute embolism and thrombosis of unspecified deep veins of right lower extremity: Secondary | ICD-10-CM | POA: Diagnosis not present

## 2022-06-30 DIAGNOSIS — Z931 Gastrostomy status: Secondary | ICD-10-CM

## 2022-06-30 DIAGNOSIS — Z6827 Body mass index (BMI) 27.0-27.9, adult: Secondary | ICD-10-CM

## 2022-06-30 DIAGNOSIS — J9503 Malfunction of tracheostomy stoma: Secondary | ICD-10-CM | POA: Diagnosis not present

## 2022-06-30 DIAGNOSIS — Z880 Allergy status to penicillin: Secondary | ICD-10-CM

## 2022-06-30 DIAGNOSIS — E440 Moderate protein-calorie malnutrition: Secondary | ICD-10-CM | POA: Diagnosis present

## 2022-06-30 DIAGNOSIS — Z79899 Other long term (current) drug therapy: Secondary | ICD-10-CM | POA: Diagnosis not present

## 2022-06-30 DIAGNOSIS — S12000D Unspecified displaced fracture of first cervical vertebra, subsequent encounter for fracture with routine healing: Secondary | ICD-10-CM

## 2022-06-30 DIAGNOSIS — Z87820 Personal history of traumatic brain injury: Secondary | ICD-10-CM | POA: Diagnosis not present

## 2022-06-30 DIAGNOSIS — R1312 Dysphagia, oropharyngeal phase: Secondary | ICD-10-CM | POA: Diagnosis not present

## 2022-06-30 DIAGNOSIS — D509 Iron deficiency anemia, unspecified: Secondary | ICD-10-CM | POA: Diagnosis present

## 2022-06-30 DIAGNOSIS — R5381 Other malaise: Secondary | ICD-10-CM | POA: Diagnosis present

## 2022-06-30 DIAGNOSIS — R195 Other fecal abnormalities: Secondary | ICD-10-CM | POA: Diagnosis not present

## 2022-06-30 DIAGNOSIS — F32A Depression, unspecified: Secondary | ICD-10-CM | POA: Diagnosis present

## 2022-06-30 DIAGNOSIS — E7220 Disorder of urea cycle metabolism, unspecified: Secondary | ICD-10-CM | POA: Diagnosis present

## 2022-06-30 DIAGNOSIS — D649 Anemia, unspecified: Secondary | ICD-10-CM | POA: Diagnosis not present

## 2022-06-30 DIAGNOSIS — R339 Retention of urine, unspecified: Secondary | ICD-10-CM | POA: Diagnosis not present

## 2022-06-30 DIAGNOSIS — S069XAA Unspecified intracranial injury with loss of consciousness status unknown, initial encounter: Secondary | ICD-10-CM | POA: Diagnosis present

## 2022-06-30 DIAGNOSIS — J398 Other specified diseases of upper respiratory tract: Secondary | ICD-10-CM | POA: Diagnosis present

## 2022-06-30 DIAGNOSIS — G40909 Epilepsy, unspecified, not intractable, without status epilepticus: Secondary | ICD-10-CM | POA: Diagnosis not present

## 2022-06-30 DIAGNOSIS — R5383 Other fatigue: Secondary | ICD-10-CM | POA: Diagnosis not present

## 2022-06-30 DIAGNOSIS — Z91010 Allergy to peanuts: Secondary | ICD-10-CM

## 2022-06-30 DIAGNOSIS — S069X9S Unspecified intracranial injury with loss of consciousness of unspecified duration, sequela: Secondary | ICD-10-CM | POA: Diagnosis not present

## 2022-06-30 DIAGNOSIS — S069XAS Unspecified intracranial injury with loss of consciousness status unknown, sequela: Secondary | ICD-10-CM | POA: Diagnosis not present

## 2022-06-30 DIAGNOSIS — G479 Sleep disorder, unspecified: Secondary | ICD-10-CM | POA: Diagnosis not present

## 2022-06-30 LAB — GLUCOSE, CAPILLARY: Glucose-Capillary: 154 mg/dL — ABNORMAL HIGH (ref 70–99)

## 2022-06-30 LAB — BLOOD GAS, ARTERIAL
Acid-base deficit: 4.3 mmol/L — ABNORMAL HIGH (ref 0.0–2.0)
Bicarbonate: 20.2 mmol/L (ref 20.0–28.0)
O2 Saturation: 93.9 %
Patient temperature: 37
pCO2 arterial: 35 mmHg (ref 32–48)
pH, Arterial: 7.37 (ref 7.35–7.45)
pO2, Arterial: 67 mmHg — ABNORMAL LOW (ref 83–108)

## 2022-06-30 LAB — CBC
HCT: 45.9 % (ref 39.0–52.0)
HCT: 40.6 % (ref 39.0–52.0)
Hemoglobin: 15.3 g/dL (ref 13.0–17.0)
Hemoglobin: 13.6 g/dL (ref 13.0–17.0)
MCH: 31.1 pg (ref 26.0–34.0)
MCH: 31.3 pg (ref 26.0–34.0)
MCHC: 33.3 g/dL (ref 30.0–36.0)
MCHC: 33.5 g/dL (ref 30.0–36.0)
MCV: 93.3 fL (ref 80.0–100.0)
MCV: 93.5 fL (ref 80.0–100.0)
Platelets: 242 10*3/uL (ref 150–400)
Platelets: 237 10*3/uL (ref 150–400)
RBC: 4.92 MIL/uL (ref 4.22–5.81)
RBC: 4.34 MIL/uL (ref 4.22–5.81)
RDW: 12.7 % (ref 11.5–15.5)
RDW: 12.6 % (ref 11.5–15.5)
WBC: 21.1 10*3/uL — ABNORMAL HIGH (ref 4.0–10.5)
WBC: 23.8 10*3/uL — ABNORMAL HIGH (ref 4.0–10.5)
nRBC: 0 % (ref 0.0–0.2)
nRBC: 0 % (ref 0.0–0.2)

## 2022-06-30 LAB — BASIC METABOLIC PANEL
Anion gap: 14 (ref 5–15)
BUN: 21 mg/dL — ABNORMAL HIGH (ref 6–20)
CO2: 19 mmol/L — ABNORMAL LOW (ref 22–32)
Calcium: 9.7 mg/dL (ref 8.9–10.3)
Chloride: 118 mmol/L — ABNORMAL HIGH (ref 98–111)
Creatinine, Ser: 0.93 mg/dL (ref 0.61–1.24)
GFR, Estimated: >60 mL/min (ref >60)
Glucose, Bld: 146 mg/dL — ABNORMAL HIGH (ref 70–99)
Potassium: 3.8 mmol/L (ref 3.5–5.1)
Sodium: 151 mmol/L — ABNORMAL HIGH (ref 135–145)

## 2022-06-30 LAB — LACTIC ACID, PLASMA
Lactic Acid, Venous: 3 mmol/L (ref 0.5–1.9)
Lactic Acid, Venous: 1.6 mmol/L (ref 0.5–1.9)
Lactic Acid, Venous: 2.1 mmol/L (ref 0.5–1.9)

## 2022-06-30 LAB — MAGNESIUM: Magnesium: 1.8 mg/dL (ref 1.7–2.4)

## 2022-06-30 LAB — MRSA NEXT GEN BY PCR, NASAL: MRSA by PCR Next Gen: NOT DETECTED

## 2022-06-30 MED ORDER — ACETAMINOPHEN 160 MG/5ML PO SOLN: 650.0000 mg | Freq: Four times a day (QID) | ORAL | Status: DC | PRN

## 2022-06-30 MED ORDER — VANCOMYCIN 50 MG/ML ORAL SOLUTION
125.0000 mg | Freq: Four times a day (QID) | ORAL | Status: DC
  Administered 2022-06-30 – 2022-07-14 (×56): 125 mg
  Filled 2022-06-30 (×64): qty 2.5

## 2022-06-30 MED ORDER — DEXTROSE-NACL 5-0.45 % IV SOLN: INTRAVENOUS | Status: DC

## 2022-06-30 MED ORDER — VANCOMYCIN HCL IN DEXTROSE 1-5 GM/200ML-% IV SOLN
1000.0000 mg | Freq: Three times a day (TID) | INTRAVENOUS | Status: DC
  Filled 2022-06-30: qty 200

## 2022-06-30 MED ORDER — APIXABAN 5 MG PO TABS
5.0000 mg | ORAL_TABLET | Freq: Two times a day (BID) | ORAL | Status: DC
  Administered 2022-06-30: 5 mg
  Filled 2022-06-30: qty 1

## 2022-06-30 MED ORDER — APIXABAN 5 MG PO TABS: 5.0000 mg | ORAL_TABLET | Freq: Two times a day (BID) | ORAL | Status: DC

## 2022-06-30 MED ORDER — SODIUM CHLORIDE 0.9 % IV SOLN
2.0000 g | Freq: Three times a day (TID) | INTRAVENOUS | Status: AC
  Administered 2022-06-30 – 2022-07-07 (×21): 2 g via INTRAVENOUS
  Filled 2022-06-30 (×22): qty 12.5

## 2022-06-30 MED ORDER — LACTATED RINGERS IV BOLUS
500.0000 mL | Freq: Once | INTRAVENOUS | Status: AC
  Administered 2022-06-30: 500 mL via INTRAVENOUS

## 2022-06-30 MED ORDER — ACETAMINOPHEN 160 MG/5ML PO SOLN
650.0000 mg | Freq: Four times a day (QID) | ORAL | Status: DC | PRN
  Administered 2022-06-30 – 2022-07-13 (×3): 650 mg
  Filled 2022-06-30 (×3): qty 20.3

## 2022-06-30 MED ORDER — PROPRANOLOL HCL 20 MG/5ML PO SOLN
50.0000 mg | Freq: Four times a day (QID) | ORAL | Status: DC
  Administered 2022-06-30 – 2022-07-14 (×56): 50 mg
  Filled 2022-06-30 (×62): qty 12.5

## 2022-06-30 MED ORDER — QUETIAPINE FUMARATE 25 MG PO TABS: 25.0000 mg | ORAL_TABLET | Freq: Every day | ORAL | Status: DC

## 2022-06-30 MED ORDER — SODIUM CHLORIDE 0.9 % IV SOLN: INTRAVENOUS | Status: DC | PRN

## 2022-06-30 MED ORDER — QUETIAPINE FUMARATE 25 MG PO TABS
25.0000 mg | ORAL_TABLET | Freq: Every day | ORAL | Status: DC
Start: 2022-06-30 — End: 2022-07-14
  Administered 2022-06-30 – 2022-07-13 (×14): 25 mg
  Filled 2022-06-30 (×14): qty 1

## 2022-06-30 MED ORDER — SODIUM CHLORIDE 0.9 % IV SOLN
2.0000 g | Freq: Three times a day (TID) | INTRAVENOUS | Status: DC
  Filled 2022-06-30 (×2): qty 12.5

## 2022-06-30 MED ORDER — VANCOMYCIN 50 MG/ML ORAL SOLUTION
125.0000 mg | Freq: Four times a day (QID) | ORAL | Status: DC
  Filled 2022-06-30 (×2): qty 2.5

## 2022-06-30 MED ORDER — VANCOMYCIN HCL 1250 MG/250ML IV SOLN: 1250.0000 mg | Freq: Three times a day (TID) | INTRAVENOUS | Status: DC

## 2022-06-30 MED ORDER — ONDANSETRON HCL 4 MG PO TABS: 4.0000 mg | ORAL_TABLET | Freq: Four times a day (QID) | ORAL | Status: DC | PRN

## 2022-06-30 MED ORDER — LACTATED RINGERS IV BOLUS
1000.0000 mL | Freq: Once | INTRAVENOUS | Status: AC
  Administered 2022-06-30: 1000 mL via INTRAVENOUS

## 2022-06-30 MED ORDER — LACTATED RINGERS IV SOLN: INTRAVENOUS | Status: DC

## 2022-06-30 MED ORDER — VANCOMYCIN HCL 2000 MG/400ML IV SOLN
2000.0000 mg | Freq: Once | INTRAVENOUS | Status: DC
  Filled 2022-06-30: qty 400

## 2022-06-30 MED ORDER — VANCOMYCIN HCL 2000 MG/400ML IV SOLN
2000.0000 mg | INTRAVENOUS | Status: DC
  Filled 2022-06-30: qty 400

## 2022-06-30 MED ORDER — ORAL CARE MOUTH RINSE
15.0000 mL | OROMUCOSAL | Status: DC
  Administered 2022-06-30 – 2022-07-14 (×53): 15 mL via OROMUCOSAL

## 2022-06-30 MED ORDER — ONDANSETRON HCL 4 MG/2ML IJ SOLN: 4.0000 mg | Freq: Four times a day (QID) | INTRAMUSCULAR | Status: DC | PRN

## 2022-06-30 MED ORDER — ORAL CARE MOUTH RINSE: 15.0000 mL | OROMUCOSAL | Status: DC | PRN

## 2022-06-30 NOTE — Assessment & Plan Note (Signed)
Peg tube in place, but keeping NPO except for meds for the moment due to aspiration. Defer to day team when / if to restart feeds. Defer to PCCM if he does / does not need trach due to aspiration risk / ability to suction.
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
## 2022-06-30 NOTE — Assessment & Plan Note (Addendum)
PNA vs pneumonitis With new acute hypoxic resp failure 1. Strict NPO 2. PCCM saw in consult and they will follow during day as well 3. Empiric cefepime + IV vanc 4. BCx pending 5. Sputum Cx pending 1. Gram stain just came back with GNR

## 2022-06-30 NOTE — Progress Notes (Signed)
Inpatient Rehabilitation Discharge Medication Review by a Pharmacist  A complete drug regimen review was completed for this patient to identify any potential clinically significant medication issues.  High Risk Drug Classes Is patient taking? Indication by Medication  Antipsychotic Yes Seroquel- agitation / sleep  Anticoagulant Yes Apixaban- DVT  Antibiotic Yes Cefepime / vanco IV ; vanco PO- HAP / C. diff  Opioid No   Antiplatelet No   Hypoglycemics/insulin No   Vasoactive Medication No   Chemotherapy No   Other No      Type of Medication Issue Identified Description of Issue Recommendation(s)  Drug Interaction(s) (clinically significant)     Duplicate Therapy     Allergy     No Medication Administration End Date     Incorrect Dose     Additional Drug Therapy Needed     Significant med changes from prior encounter (inform family/care partners about these prior to discharge).    Other       Clinically significant medication issues were identified that warrant physician communication and completion of prescribed/recommended actions by midnight of the next day:  No   Time spent performing this drug regimen review (minutes):  30  Aron Inge BS, PharmD, BCPS Clinical Pharmacist 06/30/2022 7:25 AM  Contact: [phone] after 3 PM  "Be curious, not judgmental..." -Debbora Dus
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
## 2022-06-30 NOTE — Significant Event (Signed)
Rapid Response Event Note   Reason for Call :  Red MEWS: Tachycardic, tachypnea, desating to 87%  Initial Focused Assessment:  On arrival, pt laying in bed with labored breathing noted. spO2 93% on NRB (pt previously on RA). Rhonchi bilaterally. Congested cough noted. Attempted to suction pt with yaunker. Thick, tan secretions orally.   Temp 99.4, HR 130, BP 136/93, RR 34, spO2 93% on NRB.     Interventions:  Pt placed on O2 to maintain spO2 >90% CXR Sputum sample Primary team notified- hospitalist consult CCM consult Labs Abg  Plan of Care:  Will admit to PCU. No available beds- will send to 4NICU as overflow   Event Summary:   MD Notified: Rehab NP- E. Thomas Call Time: 5974 Arrival Time: 0405 End Time:  Mordecai Rasmussen, RN

## 2022-06-30 NOTE — Progress Notes (Signed)
Inpatient Rehabilitation Care Coordinator Discharge Note   Patient Details  Name: Zohar Maroney MRN: [id number] Date of Birth: [birth year]   Discharge location: D/c to acute due to medical concerns  Length of Stay: 52 days  Discharge activity level: Total A  Home/community participation: Limited  Patient response XN:ATFTDD Literacy - How often do you need to have someone help you when you read instructions, pamphlets, or other written material from your doctor or pharmacy?: Never  Patient response UK:GURKYH Isolation - How often do you feel lonely or isolated from those around you?: Patient unable to respond  Services provided included: MD, RD, PT, OT, SLP, TR, Pharmacy, Neuropsych, SW, CM, RN  Financial Services:  Field seismologist Utilized: Media planner [place] Medicaid [employer] offered to/list presented to: Yes  Follow-up services arranged:  Home Health, Other (Comment) (referrals made to [facility] for [facility] as this is his assigned MEdicaid home.  PCS referral made to insurance company. See notes.) Home Health Agency: [facility] for HHPT/OT/SN/aide/SW (no SLP in area)        Patient response to transportation need: Is the patient able to respond to transportation needs?: Yes In the past 12 months, has lack of transportation kept you from medical appointments or from getting medications?: No In the past 12 months, has lack of transportation kept you from meetings, work, or from getting things needed for daily living?: No    Comments (or additional information):  Patient/Family verbalized understanding of follow-up arrangements:  Yes  Individual responsible for coordination of the follow-up plan: contact pt s/o [name]  Confirmed correct DME delivered: Gretchen Short 06/30/2022    Gretchen Short

## 2022-06-30 NOTE — Assessment & Plan Note (Addendum)
1. Cont PO vanc 2. Given recent regurgitation of TF: getting KUB to r/o ileus. 3. Cefepime I just started for aspiration PNA wont help this, but is needed to treat aspiration PNA given 1. Multi month long hospital and rehab stay 2. And his sputum gram stain just came back for GNR.

## 2022-06-30 NOTE — Progress Notes (Signed)
Speech Language Pathology Note  Patient Details  Name: Carl Carpenter MRN: [id number] Date of Birth: 09-24-1986 Today's Date: 06/30/2022                                                                                                                 Speech Therapy Discharge Note  This patient was unable to complete the inpatient rehab program due to change in medical status ; therefore, the patient did not meet their long term goals and has been discharged from skilled SLP services at this time.The patient left the program at a Max-Total A assist level for overall cognitive-linguistic functioning. The patient is currently NPO.   See CareTool for functional status details.  If the patient is able to return to inpatient rehabilitation within 3 midnights, this may be considered an interrupted stay and therapy services will resume as ordered. Modification and reinstatement of their goals will be made upon completion of therapy service reevaluations.     Carl Carpenter 06/30/2022, 11:25 AM

## 2022-06-30 NOTE — Progress Notes (Addendum)
Received call from Rapid Nurse:  She states Carl Carpenter with tachypnea, tachycardia and oxygen desaturation. He has thick sputum, she called respiratory therapist to obtain sputum culture. She states Carl Carpenter appears ill.  Message sent to Hospitalist, awaiting a return call and chest X-ray results.  Spoke with Dr Julian Reil, he will come to assess Carl Carpenter. Charge nurse is aware of the above. ,

## 2022-06-30 NOTE — Progress Notes (Signed)
Patients nurse requested I come assess patient. Patient respirations were @35 , patient has been lethargic, low grade temp, tachycardia. Called and the Rapid response nurse. Patient placed on 02 [place] and spo2 remained in 80's. Placed him on non-rebreather and Spo2 went to 92%, hospitalist and pulmonologist at bedside. orders placed and patient bing transferred to 4w15 for higher level of care.

## 2022-06-30 NOTE — Progress Notes (Signed)
Received a call from charge nurse reporting Carl Carpenter with Tachypnea, Tachycardia and Oxygen desaturation 83%. The nurse applied nasal cannula oxygen and his saturation remained the the 80%. Mask applied , Rapid Nurse called and Stat CXR ordered. Marland Kitchen Awaiting Results and Rapid Nurse Assessment.

## 2022-06-30 NOTE — Progress Notes (Signed)
Occupational Therapy Note  Patient Details  Name: Carl Carpenter MRN: [id number] Date of Birth: 05/20/1986  Occupational Therapy Discharge Note  This patient was unable to complete the inpatient rehab program due to d/c to acute; therefore did not meet their long term goals. Pt left the program at a total assist level for their  functional ADLs. This patient is being discharged from OT services at this time.  BIMS at time of d/c  Pt unable to complete due to medical status  See CareTool for functional status details.  If the patient is able to return to inpatient rehabilitation within 3 midnights, this may be considered an interrupted stay and therapy services will resume as ordered. Modification and reinstatement of their goals will be made upon completion of therapy service reevaluations.    Crissie Reese 06/30/2022, 8:43 AM

## 2022-06-30 NOTE — Progress Notes (Signed)
Rapid Response: NTS pt to collect Sputum sample and clear secretions from lungs. Pt sats 93 on 5L [place] RR 22.

## 2022-06-30 NOTE — Assessment & Plan Note (Addendum)
Patient has acute respiratory failure with hypoxia due to having a new oxygen requirement.  That is the patient has a PaO2 < 60 (pulse Ox < 90%) on room air. Occurs secondary to PNA. Pt with RR in the 30-40 range still, Satting 93% but requiring 5L via [place] currently. No hypercapnea demonstrated on ABG.

## 2022-06-30 NOTE — Progress Notes (Signed)
Physical Therapy Discharge Patient Details Name: Carl Carpenter MRN: [id number] DOB: [birth year] Today's Date: 06/30/2022  Physical Therapy Discharge Note  This patient was unable to complete the inpatient rehab program due to transferring to acute care due to medical issues; therefore did not meet their long term goals. Pt left the program at a maxA to totalA assist level for their functional mobility/ transfers. This patient is being discharged from PT services at this time.  Pt's perception of pain in the last five days was unable to answer at this time.    See CareTool for functional status details  If the patient is able to return to inpatient rehabilitation within 3 midnights, this may be considered an interrupted stay and therapy services will resume as ordered. Modification and reinstatement of their goals will be made upon completion of therapy service reevaluations.        [name], PT, DPT 06/30/2022, 11:51 AM
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
## 2022-06-30 NOTE — Progress Notes (Signed)
New onset of frank red blood within stool of flexi seal. Notifed Dr. Rito Ehrlich, who placed orders for CBC and holding tonight's Eliquis. Flexi seal removed. Patient's father at bedside and updated patient's mother. Mother stated that patient has a history of hemorrhoids.
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
## 2022-06-30 NOTE — Plan of Care (Signed)
Patient transferred to ICU for medical reasons.

## 2022-06-30 NOTE — Progress Notes (Signed)
eLink Physician-Brief Progress Note [name] DOB: 06-30-1986 MRN: [id number]   Date of Service  06/30/2022  HPI/Events of Note  Patient admitted with acute on chronic respiratory failure.  eICU Interventions  New Patient Evaluation.        Joee Iovine U Tiearra Colwell 06/30/2022, 7:00 AM
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
## 2022-06-30 NOTE — Assessment & Plan Note (Signed)
1. Sepsis pathway 2. Pending labs still as of time of note: BMP, lactate. 3. IVF bolus 1L bolus thus far 4. BP actually on high side (140s systolic) so havent done the full 30cc/kg bolus yet (may still need if lactate comes back elevated). 5. LR at 125 currently 6. Tele monitor 7. Source is quite obviously the PNA
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
## 2022-06-30 NOTE — Progress Notes (Signed)
   06/30/22 0342  Assess: MEWS Score  Temp 99.6 F (37.6 C)  BP (!) 133/91  MAP (mmHg) 104  Pulse Rate (!) 125  Resp (!) 47  SpO2 (!) 87 %  O2 Device Room Air  Assess: MEWS Score  MEWS Temp 0  MEWS Systolic 0  MEWS Pulse 2  MEWS RR 3  MEWS LOC 0  MEWS Score 5  MEWS Score Color Red  Assess: if the MEWS score is Yellow or Red  Were vital signs taken at a resting state? Yes  Focused Assessment No change from prior assessment  Does the patient meet 2 or more of the SIRS criteria? Yes  Does the patient have a confirmed or suspected source of infection? Yes  Provider and Rapid Response Notified? Yes  MEWS guidelines implemented *See Row Information* Yes  Treat  MEWS Interventions Escalated (See documentation below);Administered prn meds/treatments (O2 placed at this time)  Pain Scale Faces  Faces Pain Scale 0  Take Vital Signs  Increase Vital Sign Frequency  Red: Q 1hr X 4 then Q 4hr X 4, if remains red, continue Q 4hrs  Escalate  MEWS: Escalate Red: discuss with charge nurse/RN and provider, consider discussing with RRT  Notify: Charge Nurse/RN  Name of Charge Nurse/RN Notified Mickle Mallory  Date Charge Nurse/RN Notified 06/30/22  Time Charge Nurse/RN Notified 0345  Notify: Provider  Provider Name/Title Riley Lam, NP  Date Provider Notified 06/30/22  Time Provider Notified 0400  Method of Notification Call  Notification Reason Change in status (RED MEWS)  Provider response See new orders  Date of Provider Response 06/30/22  Time of Provider Response 0400  Notify: Rapid Response  Name of Rapid Response RN Notified Nicki,RN  Date Rapid Response Notified 06/30/22  Time Rapid Response Notified 0355  Document  Patient Outcome Not stable and remains on department  Progress note created (see row info) Yes  Assess: SIRS CRITERIA  SIRS Temperature  0  SIRS Pulse 1  SIRS Respirations  1  SIRS WBC 0  SIRS Score Sum  2   Pt noted with tachypnea, tachycardia, increased  work of breathing, noted with O2 sats in low 80s. O2 sats remained in mid 80s despite application of O2 via nasal cannula increasing as high as 10 liters, with O2 sats checked on various fingers and ear with consistent with readings. Non-rebreather placed at 10 liters, pt now with O2 sats 93-94%, with minimal effect in work of breathing. CXR ordered and complete, awaiting results. Rapid response at bedside.

## 2022-07-01 LAB — CBC
HCT: 36.1 % — ABNORMAL LOW (ref 39.0–52.0)
Hemoglobin: 12 g/dL — ABNORMAL LOW (ref 13.0–17.0)
MCH: 31.5 pg (ref 26.0–34.0)
MCHC: 33.2 g/dL (ref 30.0–36.0)
MCV: 94.8 fL (ref 80.0–100.0)
Platelets: 198 10*3/uL (ref 150–400)
RBC: 3.81 MIL/uL — ABNORMAL LOW (ref 4.22–5.81)
RDW: 12.9 % (ref 11.5–15.5)
WBC: 16.6 10*3/uL — ABNORMAL HIGH (ref 4.0–10.5)
nRBC: 0 % (ref 0.0–0.2)

## 2022-07-01 LAB — PROTIME-INR
INR: 1.2 (ref 0.8–1.2)
Prothrombin Time: 15.5 seconds — ABNORMAL HIGH (ref 11.4–15.2)

## 2022-07-01 LAB — COMPREHENSIVE METABOLIC PANEL
ALT: 69 U/L — ABNORMAL HIGH (ref 0–44)
AST: 19 U/L (ref 15–41)
Albumin: 3.2 g/dL — ABNORMAL LOW (ref 3.5–5.0)
Alkaline Phosphatase: 64 U/L (ref 38–126)
Anion gap: 5 (ref 5–15)
BUN: 19 mg/dL (ref 6–20)
CO2: 22 mmol/L (ref 22–32)
Calcium: 9 mg/dL (ref 8.9–10.3)
Chloride: 119 mmol/L — ABNORMAL HIGH (ref 98–111)
Creatinine, Ser: 0.64 mg/dL (ref 0.61–1.24)
GFR, Estimated: >60 mL/min (ref >60)
Glucose, Bld: 140 mg/dL — ABNORMAL HIGH (ref 70–99)
Potassium: 3.6 mmol/L (ref 3.5–5.1)
Sodium: 146 mmol/L — ABNORMAL HIGH (ref 135–145)
Total Bilirubin: 0.7 mg/dL (ref 0.3–1.2)
Total Protein: 6.5 g/dL (ref 6.5–8.1)

## 2022-07-01 LAB — GLUCOSE, CAPILLARY
Glucose-Capillary: 130 mg/dL — ABNORMAL HIGH (ref 70–99)
Glucose-Capillary: 140 mg/dL — ABNORMAL HIGH (ref 70–99)

## 2022-07-01 LAB — LACTIC ACID, PLASMA
Lactic Acid, Venous: 1.4 mmol/L (ref 0.5–1.9)
Lactic Acid, Venous: 1.5 mmol/L (ref 0.5–1.9)

## 2022-07-01 LAB — CORTISOL-AM, BLOOD: Cortisol - AM: 20.3 ug/dL (ref 6.7–22.6)

## 2022-07-01 LAB — PROCALCITONIN: Procalcitonin: 0.13 ng/mL

## 2022-07-01 MED ORDER — GUAIFENESIN-DM 100-10 MG/5ML PO SYRP
15.0000 mL | ORAL_SOLUTION | Freq: Four times a day (QID) | ORAL | Status: DC
  Administered 2022-07-01 – 2022-07-14 (×53): 15 mL
  Filled 2022-07-01 (×53): qty 15

## 2022-07-01 MED ORDER — METHYLPHENIDATE HCL 5 MG PO TABS
15.0000 mg | ORAL_TABLET | Freq: Two times a day (BID) | ORAL | Status: DC
  Administered 2022-07-01 – 2022-07-05 (×9): 15 mg
  Filled 2022-07-01 (×10): qty 3

## 2022-07-01 MED ORDER — CHLORHEXIDINE GLUCONATE CLOTH 2 % EX PADS
6.0000 | MEDICATED_PAD | Freq: Every day | CUTANEOUS | Status: DC
Start: 2022-07-01 — End: 2022-07-14
  Administered 2022-07-01 – 2022-07-14 (×14): 6 via TOPICAL

## 2022-07-01 MED ORDER — JEVITY 1.5 CAL/FIBER PO LIQD
1000.0000 mL | ORAL | Status: DC
Start: 2022-07-01 — End: 2022-07-02
  Administered 2022-07-01: 1000 mL
  Filled 2022-07-01 (×2): qty 1000

## 2022-07-01 MED ORDER — HEPARIN SODIUM (PORCINE) 5000 UNIT/ML IJ SOLN
5000.0000 [IU] | Freq: Three times a day (TID) | INTRAMUSCULAR | Status: DC
  Administered 2022-07-01 – 2022-07-06 (×17): 5000 [IU] via SUBCUTANEOUS
  Filled 2022-07-01 (×17): qty 1

## 2022-07-01 MED ORDER — METHYLPHENIDATE HCL 5 MG PO TABS: 15.0000 mg | ORAL_TABLET | Freq: Two times a day (BID) | ORAL | Status: DC
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
## 2022-07-01 NOTE — Plan of Care (Signed)

## 2022-07-01 NOTE — Progress Notes (Signed)
Initial Nutrition Assessment  DOCUMENTATION CODES:   Non-severe (moderate) malnutrition in context of chronic illness  INTERVENTION:   Initiate tube feeding via PEG: Jevity 1.5 at 20 ml/h  As tolerated recommend increase by 10 ml every 4 hours to goal rate of 80 ml/hr (1920 ml per day) Prosource TF 45 ml QID  Provides 3040 kcal, 166 gm protein, 1459 ml free water daily    NUTRITION DIAGNOSIS:   Moderate Malnutrition related to chronic illness (TBI) as evidenced by moderate fat depletion, moderate muscle depletion, severe muscle depletion.  GOAL:   Patient will meet greater than or equal to 90% of their needs  MONITOR:   TF tolerance  REASON FOR ASSESSMENT:   Consult Enteral/tube feeding initiation and management  ASSESSMENT:   Pt with PMH of recent severe TBI admitted 02/2022, tx to rehab 04/2022 now readmitted with aspiration PNA.   Pt discussed during ICU rounds and with RN. Pt recently developed c.diff at rehab.  Plan to resume trickle TF today  7/9 dx with c.diff while in rehab 7/11 tx back to [facility] ICU with aspiration PNA 7/12 start trickle TF   Medications reviewed and include:  D5 1/2 NS at 125 ml/hr   Labs reviewed: Na 146   NUTRITION - FOCUSED PHYSICAL EXAM:  Flowsheet Row Most Recent Value  Orbital Region Moderate depletion  Upper Arm Region Moderate depletion  Thoracic and Lumbar Region Moderate depletion  Buccal Region Moderate depletion  Temple Region Moderate depletion  Clavicle Bone Region Severe depletion  Clavicle and Acromion Bone Region Moderate depletion  Scapular Bone Region Unable to assess  Dorsal Hand Mild depletion  Patellar Region Moderate depletion  Anterior Thigh Region Moderate depletion  Posterior Calf Region Moderate depletion  Edema (RD Assessment) None  Hair Reviewed  Eyes Reviewed  Mouth Reviewed  Skin Reviewed  Nails Reviewed       Diet Order:   Diet Order             Diet NPO time specified  Diet effective  now                   EDUCATION NEEDS:   Not appropriate for education at this time  Skin:  Skin Assessment: Reviewed RN Assessment  Last BM:  350 ml x 24 hr  Height:   Ht Readings from Last 1 Encounters:  05/11/22 6\' 5"  (1.956 m)    Weight:   Wt Readings from Last 1 Encounters:  06/29/22 100.7 kg    Ideal Body Weight:   94.5 kg   BMI:  There is no height or weight on file to calculate BMI.  Estimated Nutritional Needs:   Kcal:  2800-3000  Protein:  150-170 grams  Fluid:  >2.8 L/day  08/30/22., RD, LDN, CNSC See AMi[facility] for contact information

## 2022-07-01 NOTE — Progress Notes (Signed)
Sent text to J. Garner Nash pertaining to pt getting CBG order and clarity if pt will need to conti D5-.45NS IVF while TF going. No response.

## 2022-07-01 NOTE — Progress Notes (Signed)
Inpatient Rehab Admissions Coordinator:   CIR feels that Pt had  plateaued and was near d/c date  and team does not wish to bring Pt. Back to CIR for additional therapy following acute admission. I discussed with pt.'s father who states he wants to take patient home and does not want SNF for Pt. I will notify TOC  Megan Salon, MS, CCC-SLP Rehab Admissions Coordinator  [phone] (celll) [phone] (office)
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
## 2022-07-02 ENCOUNTER — Inpatient Hospital Stay (HOSPITAL_COMMUNITY): Payer: Medicaid Other

## 2022-07-02 LAB — CBC WITH DIFFERENTIAL/PLATELET
Abs Immature Granulocytes: 0.04 10*3/uL (ref 0.00–0.07)
Basophils Absolute: 0.1 10*3/uL (ref 0.0–0.1)
Basophils Relative: 1 %
Eosinophils Absolute: 0.4 10*3/uL (ref 0.0–0.5)
Eosinophils Relative: 4 %
HCT: 30.9 % — ABNORMAL LOW (ref 39.0–52.0)
Hemoglobin: 10.3 g/dL — ABNORMAL LOW (ref 13.0–17.0)
Immature Granulocytes: 0 %
Lymphocytes Relative: 23 %
Lymphs Abs: 2.3 10*3/uL (ref 0.7–4.0)
MCH: 31.6 pg (ref 26.0–34.0)
MCHC: 33.3 g/dL (ref 30.0–36.0)
MCV: 94.8 fL (ref 80.0–100.0)
Monocytes Absolute: 0.6 10*3/uL (ref 0.1–1.0)
Monocytes Relative: 6 %
Neutro Abs: 6.7 10*3/uL (ref 1.7–7.7)
Neutrophils Relative %: 66 %
Platelets: 150 10*3/uL (ref 150–400)
RBC: 3.26 MIL/uL — ABNORMAL LOW (ref 4.22–5.81)
RDW: 12.9 % (ref 11.5–15.5)
WBC: 10 10*3/uL (ref 4.0–10.5)
nRBC: 0 % (ref 0.0–0.2)

## 2022-07-02 LAB — CULTURE, RESPIRATORY W GRAM STAIN: Culture: NORMAL

## 2022-07-02 LAB — GLUCOSE, CAPILLARY
Glucose-Capillary: 133 mg/dL — ABNORMAL HIGH (ref 70–99)
Glucose-Capillary: 118 mg/dL — ABNORMAL HIGH (ref 70–99)
Glucose-Capillary: 130 mg/dL — ABNORMAL HIGH (ref 70–99)
Glucose-Capillary: 135 mg/dL — ABNORMAL HIGH (ref 70–99)

## 2022-07-02 LAB — COMPREHENSIVE METABOLIC PANEL
ALT: 47 U/L — ABNORMAL HIGH (ref 0–44)
AST: 16 U/L (ref 15–41)
Albumin: 3 g/dL — ABNORMAL LOW (ref 3.5–5.0)
Alkaline Phosphatase: 69 U/L (ref 38–126)
Anion gap: 9 (ref 5–15)
BUN: 13 mg/dL (ref 6–20)
CO2: 24 mmol/L (ref 22–32)
Calcium: 9.2 mg/dL (ref 8.9–10.3)
Chloride: 114 mmol/L — ABNORMAL HIGH (ref 98–111)
Creatinine, Ser: 0.59 mg/dL — ABNORMAL LOW (ref 0.61–1.24)
GFR, Estimated: >60 mL/min (ref >60)
Glucose, Bld: 120 mg/dL — ABNORMAL HIGH (ref 70–99)
Potassium: 3.2 mmol/L — ABNORMAL LOW (ref 3.5–5.1)
Sodium: 147 mmol/L — ABNORMAL HIGH (ref 135–145)
Total Bilirubin: 0.6 mg/dL (ref 0.3–1.2)
Total Protein: 6.3 g/dL — ABNORMAL LOW (ref 6.5–8.1)

## 2022-07-02 LAB — IRON AND TIBC
Iron: 31 ug/dL — ABNORMAL LOW (ref 45–182)
Saturation Ratios: 16 % — ABNORMAL LOW (ref 17.9–39.5)
TIBC: 200 ug/dL — ABNORMAL LOW (ref 250–450)
UIBC: 169 ug/dL

## 2022-07-02 LAB — RETICULOCYTES
Immature Retic Fract: 7.1 % (ref 2.3–15.9)
RBC.: 3.33 MIL/uL — ABNORMAL LOW (ref 4.22–5.81)
Retic Count, Absolute: 54.6 10*3/uL (ref 19.0–186.0)
Retic Ct Pct: 1.6 % (ref 0.4–3.1)

## 2022-07-02 LAB — MAGNESIUM: Magnesium: 1.9 mg/dL (ref 1.7–2.4)

## 2022-07-02 LAB — PHOSPHORUS: Phosphorus: 3.5 mg/dL (ref 2.5–4.6)

## 2022-07-02 LAB — VITAMIN B12: Vitamin B-12: 555 pg/mL (ref 180–914)

## 2022-07-02 LAB — FOLATE: Folate: 22 ng/mL (ref >5.9)

## 2022-07-02 LAB — FERRITIN: Ferritin: 165 ng/mL (ref 24–336)

## 2022-07-02 MED ORDER — AZITHROMYCIN 500 MG PO TABS
500.0000 mg | ORAL_TABLET | Freq: Every day | ORAL | Status: AC
  Administered 2022-07-02: 500 mg
  Filled 2022-07-02: qty 1

## 2022-07-02 MED ORDER — PROSOURCE TF PO LIQD
45.0000 mL | Freq: Four times a day (QID) | ORAL | Status: DC
  Administered 2022-07-02 – 2022-07-14 (×48): 45 mL
  Filled 2022-07-02 (×48): qty 45

## 2022-07-02 MED ORDER — AZITHROMYCIN 250 MG PO TABS
250.0000 mg | ORAL_TABLET | Freq: Every day | ORAL | Status: AC
  Administered 2022-07-03 – 2022-07-06 (×4): 250 mg
  Filled 2022-07-02 (×4): qty 1

## 2022-07-02 MED ORDER — JEVITY 1.5 CAL/FIBER PO LIQD
1000.0000 mL | ORAL | Status: DC
Start: 2022-07-02 — End: 2022-07-14
  Administered 2022-07-02 – 2022-07-14 (×14): 1000 mL
  Filled 2022-07-02 (×26): qty 1000
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
## 2022-07-02 NOTE — Progress Notes (Signed)
Nutrition Brief Note   Pt tolerating TF at trickle rate. Confirmed with CCM, ok to advance TF to goal rate.    Tube feeding via PEG: Jevity 1.5 at 20 ml/h   Increase by 10 ml every 4 hours to goal rate of 80 ml/hr (1920 ml per day) Prosource TF 45 ml QID   Provides 3040 kcal, 166 gm protein, 1459 ml free water daily  Owais Pruett P., RD, LDN, CNSC See AMiON for contact information
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
## 2022-07-03 ENCOUNTER — Inpatient Hospital Stay (HOSPITAL_COMMUNITY): Payer: Medicaid Other

## 2022-07-03 DIAGNOSIS — R569 Unspecified convulsions: Secondary | ICD-10-CM

## 2022-07-03 DIAGNOSIS — R195 Other fecal abnormalities: Secondary | ICD-10-CM

## 2022-07-03 LAB — GLUCOSE, CAPILLARY
Glucose-Capillary: 108 mg/dL — ABNORMAL HIGH (ref 70–99)
Glucose-Capillary: 114 mg/dL — ABNORMAL HIGH (ref 70–99)
Glucose-Capillary: 119 mg/dL — ABNORMAL HIGH (ref 70–99)
Glucose-Capillary: 121 mg/dL — ABNORMAL HIGH (ref 70–99)
Glucose-Capillary: 127 mg/dL — ABNORMAL HIGH (ref 70–99)
Glucose-Capillary: 133 mg/dL — ABNORMAL HIGH (ref 70–99)

## 2022-07-03 LAB — PHOSPHORUS: Phosphorus: 3.8 mg/dL (ref 2.5–4.6)

## 2022-07-03 LAB — MAGNESIUM: Magnesium: 1.9 mg/dL (ref 1.7–2.4)

## 2022-07-03 LAB — COMPREHENSIVE METABOLIC PANEL
ALT: 37 U/L (ref 0–44)
AST: 17 U/L (ref 15–41)
Albumin: 2.7 g/dL — ABNORMAL LOW (ref 3.5–5.0)
Alkaline Phosphatase: 64 U/L (ref 38–126)
Anion gap: 10 (ref 5–15)
BUN: 13 mg/dL (ref 6–20)
CO2: 25 mmol/L (ref 22–32)
Calcium: 8.5 mg/dL — ABNORMAL LOW (ref 8.9–10.3)
Chloride: 111 mmol/L (ref 98–111)
Creatinine, Ser: 0.6 mg/dL — ABNORMAL LOW (ref 0.61–1.24)
GFR, Estimated: >60 mL/min (ref >60)
Glucose, Bld: 134 mg/dL — ABNORMAL HIGH (ref 70–99)
Potassium: 2.8 mmol/L — ABNORMAL LOW (ref 3.5–5.1)
Sodium: 146 mmol/L — ABNORMAL HIGH (ref 135–145)
Total Bilirubin: 0.4 mg/dL (ref 0.3–1.2)
Total Protein: 5.7 g/dL — ABNORMAL LOW (ref 6.5–8.1)

## 2022-07-03 LAB — OCCULT BLOOD X 1 CARD TO LAB, STOOL: Fecal Occult Bld: POSITIVE — AB

## 2022-07-03 LAB — CBC WITH DIFFERENTIAL/PLATELET
Abs Immature Granulocytes: 0.02 10*3/uL (ref 0.00–0.07)
Basophils Absolute: 0.1 10*3/uL (ref 0.0–0.1)
Basophils Relative: 1 %
Eosinophils Absolute: 0.3 10*3/uL (ref 0.0–0.5)
Eosinophils Relative: 5 %
HCT: 26.5 % — ABNORMAL LOW (ref 39.0–52.0)
Hemoglobin: 8.9 g/dL — ABNORMAL LOW (ref 13.0–17.0)
Immature Granulocytes: 0 %
Lymphocytes Relative: 31 %
Lymphs Abs: 2.2 10*3/uL (ref 0.7–4.0)
MCH: 31.3 pg (ref 26.0–34.0)
MCHC: 33.6 g/dL (ref 30.0–36.0)
MCV: 93.3 fL (ref 80.0–100.0)
Monocytes Absolute: 0.4 10*3/uL (ref 0.1–1.0)
Monocytes Relative: 6 %
Neutro Abs: 4.1 10*3/uL (ref 1.7–7.7)
Neutrophils Relative %: 57 %
Platelets: 149 10*3/uL — ABNORMAL LOW (ref 150–400)
RBC: 2.84 MIL/uL — ABNORMAL LOW (ref 4.22–5.81)
RDW: 12.8 % (ref 11.5–15.5)
WBC: 7.1 10*3/uL (ref 4.0–10.5)
nRBC: 0 % (ref 0.0–0.2)

## 2022-07-03 MED ORDER — POTASSIUM CHLORIDE 10 MEQ/100ML IV SOLN
10.0000 meq | INTRAVENOUS | Status: AC
  Administered 2022-07-03 (×4): 10 meq via INTRAVENOUS
  Filled 2022-07-03 (×4): qty 100

## 2022-07-03 MED ORDER — ALBUTEROL SULFATE (2.5 MG/3ML) 0.083% IN NEBU
INHALATION_SOLUTION | RESPIRATORY_TRACT | Status: AC
  Administered 2022-07-03: 2.5 mg
  Filled 2022-07-03: qty 3

## 2022-07-03 MED ORDER — IPRATROPIUM-ALBUTEROL 0.5-2.5 (3) MG/3ML IN SOLN
3.0000 mL | Freq: Four times a day (QID) | RESPIRATORY_TRACT | Status: DC
  Administered 2022-07-04 (×4): 3 mL via RESPIRATORY_TRACT
  Filled 2022-07-03 (×4): qty 3

## 2022-07-03 MED ORDER — SACCHAROMYCES BOULARDII 250 MG PO CAPS
250.0000 mg | ORAL_CAPSULE | Freq: Two times a day (BID) | ORAL | Status: DC
  Administered 2022-07-04: 250 mg via ORAL
  Filled 2022-07-03: qty 1

## 2022-07-03 MED ORDER — POTASSIUM CHLORIDE 20 MEQ PO PACK
40.0000 meq | PACK | Freq: Two times a day (BID) | ORAL | Status: AC
  Administered 2022-07-03 (×2): 40 meq
  Filled 2022-07-03 (×2): qty 2

## 2022-07-03 MED ORDER — POTASSIUM CHLORIDE 2 MEQ/ML IV SOLN
INTRAVENOUS | Status: DC
  Filled 2022-07-03: qty 1000

## 2022-07-03 MED ORDER — PANTOPRAZOLE SODIUM 40 MG IV SOLR
40.0000 mg | INTRAVENOUS | Status: DC
  Administered 2022-07-03 – 2022-07-09 (×7): 40 mg via INTRAVENOUS
  Filled 2022-07-03 (×6): qty 10

## 2022-07-03 MED ORDER — FREE WATER
200.0000 mL | Freq: Three times a day (TID) | Status: DC
  Administered 2022-07-04 – 2022-07-14 (×33): 200 mL

## 2022-07-03 NOTE — Progress Notes (Signed)
SLP Cancellation Note  Patient Details Name: Carl Carpenter MRN: [id number] DOB: 1986-10-16   Cancelled treatment:       Reason Eval/Treat Not Completed: Medical issues which prohibited therapy. Attempted to see pt this am for swallow eval. GI also here to see him, and they are unsure if he will need to remain NPO for their assessment. As discussed with her and pt's RN, will hold swallow eval this morning and f/u as able.     Mahala Menghini., M.A. CCC-SLP Acute Rehabilitation Services Office [phone]  Secure chat preferred  07/03/2022, 9:26 AM
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
## 2022-07-03 NOTE — Procedures (Signed)
[name]  MRN: [id number]  Epilepsy Attending: Charlsie Quest  Referring Physician/Provider: Merlene Laughter, DO Date: 07/03/2022 Duration: 23.35 mins  Patient history: 35yM with h/o TBI, noted to have seizure-like activity described as staring, neck and head turned to the left with some rhythmic twitching. EEG to evaluate for seizure.   Level of alertness: Awake  AEDs during EEG study: None  Technical aspects: This EEG study was done with scalp electrodes positioned according to the 10-20 International system of electrode placement. Electrical activity was acquired at a sampling rate of 500Hz  and reviewed with a high frequency filter of 70Hz  and a low frequency filter of 1Hz . EEG data were recorded continuously and digitally stored.   Description: The posterior dominant rhythm consists of 9 Hz activity of moderate voltage (25-35 uV) seen predominantly in posterior head regions, symmetric and reactive to eye opening and eye closing. EEG showed intermittent generalized 3 to 6 Hz theta-delta slowing. Hyperventilation and photic stimulation were not performed.     ABNORMALITY - Intermittent slow, generalized  IMPRESSION: This study is suggestive of mild diffuse encephalopathy, nonspecific etiology. No seizures or epileptiform discharges were seen throughout the recording.   Elon Lomeli 

## 2022-07-03 NOTE — Progress Notes (Signed)
PT Cancellation Note  Patient Details Name: Carl Carpenter MRN: [id number] DOB: 1986/05/24   Cancelled Treatment:    Reason Eval/Treat Not Completed: Patient at procedure or test/unavailable - RN attending to patient then tech waiting to do EEG. Will follow-up for PT Evaluation as schedule permits.  Ina Homes, PT, DPT Acute Rehabilitation Services  Personal: Secure Chat Rehab Office: [phone]  Malachy Chamber 07/03/2022, 2:04 PM

## 2022-07-03 NOTE — Progress Notes (Signed)
EEG completed, results pending. 

## 2022-07-03 NOTE — TOC Progression Note (Signed)
Transition of Care (TOC) - Progression Note  Donn Pierini RN, BSN Transitions of Care Unit 4E- RN Case Manager See Treatment Team for direct phone #    Patient Details  Name: Ilyas Lipsitz MRN: [id number] Date of Birth: 1986-11-30  [facility]) CM/SW Contact  Zenda Alpers, Lenn Sink, RN Phone Number: 07/03/2022, 2:53 PM  Clinical Narrative:    Spoke with CIR - SW- Cecile Sheerer who provided info regarding home w/c, per CIR PT they were working with [facility] for specialty w/c and had appointment for sizing prior to pt readmit to acute.  Contacted [facility] [phone]) and they do have pt in their system but had cancelled appointment for sizing when they where notified of acute re-admit. Per Barbara Cower (who was to come do fitting) they can come to the acute side and work with PT for w/c sizing prior to discharging home for specialty w/c. They have an opening for an appointment next Tues. July 18 12-1pm. CM has tentatively scheduled pt with [facility] for w/c sizing and have spoken with inpt PT team to make sure they slot pt on their scheduling for the appointment time. Spoke with SOCarollee Herter and updated CM will f/u on Monday to confirm. - w/c appointment w/ [facility]- 7/18- Noon-1pm  TOC will continue to follow for further PT/OT DME recommendations and transition needs for home.    Expected Discharge Plan: Home w Home Health Services Barriers to Discharge: Continued Medical Work up  Expected Discharge Plan and Services Expected Discharge Plan: Home w Home Health Services   Discharge Planning Services: CM Consult Post Acute Care Choice: Home Health Living arrangements for the past 2 months: Single Family Home                                       Social Determinants of Health (SDOH) Interventions    Readmission Risk Interventions     No data to display
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
## 2022-07-03 NOTE — Progress Notes (Signed)
OT Cancellation Note  Patient Details Name: Carl Carpenter MRN: [id number] DOB: [birth year]   Cancelled Treatment:    Reason Eval/Treat Not Completed: Patient at procedure or test/ unavailable.  EEG being set up.  Continue efforts as appropriate.  Kalen Ratajczak D TRUE [place] 07/03/2022, 2:31 PM

## 2022-07-03 NOTE — Progress Notes (Signed)
Patient note voided. Bladder scan 348 ml. Judeth Cornfield Rn assist. W/ in and out cath. Got 500 ml amber urine. Patient tol. Well.

## 2022-07-04 ENCOUNTER — Inpatient Hospital Stay (HOSPITAL_COMMUNITY): Payer: Medicaid Other

## 2022-07-04 LAB — CBC WITH DIFFERENTIAL/PLATELET
Abs Immature Granulocytes: 0.04 10*3/uL (ref 0.00–0.07)
Basophils Absolute: 0 10*3/uL (ref 0.0–0.1)
Basophils Relative: 0 %
Eosinophils Absolute: 0.4 10*3/uL (ref 0.0–0.5)
Eosinophils Relative: 4 %
HCT: 28.5 % — ABNORMAL LOW (ref 39.0–52.0)
Hemoglobin: 9.5 g/dL — ABNORMAL LOW (ref 13.0–17.0)
Immature Granulocytes: 0 %
Lymphocytes Relative: 22 %
Lymphs Abs: 2.1 10*3/uL (ref 0.7–4.0)
MCH: 31 pg (ref 26.0–34.0)
MCHC: 33.3 g/dL (ref 30.0–36.0)
MCV: 93.1 fL (ref 80.0–100.0)
Monocytes Absolute: 0.7 10*3/uL (ref 0.1–1.0)
Monocytes Relative: 7 %
Neutro Abs: 6.4 10*3/uL (ref 1.7–7.7)
Neutrophils Relative %: 67 %
Platelets: 178 10*3/uL (ref 150–400)
RBC: 3.06 MIL/uL — ABNORMAL LOW (ref 4.22–5.81)
RDW: 12.4 % (ref 11.5–15.5)
WBC: 9.7 10*3/uL (ref 4.0–10.5)
nRBC: 0 % (ref 0.0–0.2)

## 2022-07-04 LAB — COMPREHENSIVE METABOLIC PANEL
ALT: 32 U/L (ref 0–44)
AST: 16 U/L (ref 15–41)
Albumin: 2.9 g/dL — ABNORMAL LOW (ref 3.5–5.0)
Alkaline Phosphatase: 74 U/L (ref 38–126)
Anion gap: 6 (ref 5–15)
BUN: 9 mg/dL (ref 6–20)
CO2: 23 mmol/L (ref 22–32)
Calcium: 8.7 mg/dL — ABNORMAL LOW (ref 8.9–10.3)
Chloride: 113 mmol/L — ABNORMAL HIGH (ref 98–111)
Creatinine, Ser: 0.53 mg/dL — ABNORMAL LOW (ref 0.61–1.24)
GFR, Estimated: >60 mL/min (ref >60)
Glucose, Bld: 97 mg/dL (ref 70–99)
Potassium: 3.3 mmol/L — ABNORMAL LOW (ref 3.5–5.1)
Sodium: 142 mmol/L (ref 135–145)
Total Bilirubin: 0.4 mg/dL (ref 0.3–1.2)
Total Protein: 6 g/dL — ABNORMAL LOW (ref 6.5–8.1)

## 2022-07-04 LAB — PHOSPHORUS: Phosphorus: 2.8 mg/dL (ref 2.5–4.6)

## 2022-07-04 LAB — GLUCOSE, CAPILLARY
Glucose-Capillary: 103 mg/dL — ABNORMAL HIGH (ref 70–99)
Glucose-Capillary: 105 mg/dL — ABNORMAL HIGH (ref 70–99)
Glucose-Capillary: 120 mg/dL — ABNORMAL HIGH (ref 70–99)
Glucose-Capillary: 93 mg/dL (ref 70–99)
Glucose-Capillary: 95 mg/dL (ref 70–99)
Glucose-Capillary: 97 mg/dL (ref 70–99)

## 2022-07-04 LAB — EXPECTORATED SPUTUM ASSESSMENT W GRAM STAIN, RFLX TO RESP C

## 2022-07-04 LAB — MAGNESIUM: Magnesium: 2 mg/dL (ref 1.7–2.4)

## 2022-07-04 MED ORDER — ALBUTEROL SULFATE (2.5 MG/3ML) 0.083% IN NEBU
2.5000 mg | INHALATION_SOLUTION | RESPIRATORY_TRACT | Status: AC
  Administered 2022-07-04: 2.5 mg via RESPIRATORY_TRACT
  Filled 2022-07-04: qty 3

## 2022-07-04 MED ORDER — POTASSIUM CHLORIDE 20 MEQ PO PACK
40.0000 meq | PACK | Freq: Two times a day (BID) | ORAL | Status: AC
Start: 2022-07-04 — End: 2022-07-04
  Administered 2022-07-04 (×2): 40 meq
  Filled 2022-07-04 (×2): qty 2

## 2022-07-04 MED ORDER — HYDROCOD POLI-CHLORPHE POLI ER 10-8 MG/5ML PO SUER
5.0000 mL | Freq: Two times a day (BID) | ORAL | Status: DC | PRN
  Administered 2022-07-04 – 2022-07-08 (×6): 5 mL
  Filled 2022-07-04 (×8): qty 5

## 2022-07-04 MED ORDER — SACCHAROMYCES BOULARDII 250 MG PO CAPS
250.0000 mg | ORAL_CAPSULE | Freq: Two times a day (BID) | ORAL | Status: DC
Start: 2022-07-04 — End: 2022-07-14
  Administered 2022-07-04 – 2022-07-14 (×21): 250 mg
  Filled 2022-07-04 (×21): qty 1

## 2022-07-04 NOTE — Progress Notes (Addendum)
Patient coughing again non stop thick white mucus coming from mouth and nose. Oral suction not much.Breathing treatment given. Resp. Therapy aware. 02 sat 89-90% on room air applied o2 at 2 liters per nasal canula. Skin warm and diaphoric.Dr. Margo Aye  text page of the above. See orders.O2 sat 95% on 2 liters. Judeth Cornfield RN, Delma Post. with rapid Response and Dr. Margo Aye aware of the above and CXR results.
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
## 2022-07-04 NOTE — Progress Notes (Addendum)
CBG 93 

## 2022-07-04 NOTE — Progress Notes (Signed)
   07/03/22 2131  Vitals  Temp 99.6 F (37.6 C)  Temp Source Oral  BP 135/78  MAP (mmHg) 94  BP Location Right Arm  BP Method Automatic  Patient Position (if appropriate) Lying  Pulse Rate (!) 105  Pulse Rate Source Monitor  ECG Heart Rate (!) 105  Resp (!) 36  Level of Consciousness  Level of Consciousness Alert  MEWS COLOR  MEWS Score Color Red  Oxygen Therapy  SpO2 100 %  O2 Device Nasal Cannula  O2 Flow Rate (L/min) 4 L/min  Patient Activity (if Appropriate) In bed  Pain Assessment  Pain Scale 0-10  Pain Score 0  MEWS Score  MEWS Temp 0  MEWS Systolic 0  MEWS Pulse 1  MEWS RR 3  MEWS LOC 0  MEWS Score 4  Provider Notification  Provider Name/Title Brackins  Date Provider Notified 07/03/22  Time Provider Notified 2140  Method of Notification Page;Call  Notification Reason Change in status  Provider response See new orders  Date of Provider Response 07/03/22  Time of Provider Response 2145  Rapid Response Notification  Name of Rapid Response RN Notified Onalee Hua RN  Date Rapid Response Notified 07/03/22  Time Rapid Response Notified 2140   Patient coughing non stop, thick white mucus from nose and mouth,skin warm and diaphoric, resp. Tachy red mews. Stephanine R.N. and Delma Post. with Rapid Response aware, Dr. Margo Aye was called and made aware of change in patient status. See orders.

## 2022-07-04 NOTE — Progress Notes (Addendum)
Received a call from bedside RN regarding the patient coughing uncontrollably.  Advised to give albuterol neb treatment, which aborted his cough.  Dc'd IV fluid due to mild increase in pulmonary vascularity seen on personally reviewed CXR taken on 07/03/22  Presented at bedside.  The patient was asleep and coughing spell had resolved.  He was on room air with no use of accessory muscles to breathe.  Lung sounds were diminished with poor inspiratory effort.  Updated his significant other at bedside.    Watery stools noted at bedside with rectal Flexi-Seal in place with ongoing treatment for c-diff infection.  Added Florastor 250 mg twice daily.  While at bedside, RN reported minimal urine output with concern for dehydration, restarted IV fluid LR Kcl at 50cc/hr.   Received another call from bedside RN regarding the patient coughing again uncontrollably.  CXR was done (07/04/22), which I personally reviewed.  It showed overt pulmonary edema.  Stopped IV fluid.  1 dose Albuterol neb ordered.  Free water flushes were added 200 cc every 8 hours, to avoid dehydration in the setting of diarrhea from C. difficile infection.  Thus far, the patient has tolerated his tube feedings with no residuals.  Aspiration precautions are in place with head of bed elevated >30 degree angle during tube feedings.

## 2022-07-04 NOTE — Progress Notes (Signed)
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   ipratropium-albuterol  3 mL Nebulization Q6H   methylphenidate  15 mg Per Tube BID WC   mouth rinse  15 mL Mouth Rinse 4 times per day   pantoprazole (PROTONIX) IV  40 mg Intravenous Q24H   potassium chloride  40 mEq Per Tube BID   propranolol  50 mg Per Tube QID   QUEtiapine  25 mg Per Tube QHS   saccharomyces boulardii  250 mg Per Tube BID   vancomycin  125 mg Per Tube Q6H   Continuous Infusions:  sodium chloride Stopped (07/01/22 1630)   ceFEPime (MAXIPIME) IV 2 g (07/04/22 1835)   feeding supplement (JEVITY 1.5 CAL/FIBER) 1,000 mL (07/04/22 1101)    LOS: 4 days   Marguerita Merles, DO [facility] Available via Epic secure chat 7am-7pm After these hours, please refer to coverage provider listed on [email or web address] 07/04/2022, 6:57 PM
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
## 2022-07-05 ENCOUNTER — Inpatient Hospital Stay (HOSPITAL_COMMUNITY): Payer: Medicaid Other

## 2022-07-05 LAB — CBC WITH DIFFERENTIAL/PLATELET
Abs Immature Granulocytes: 0.03 10*3/uL (ref 0.00–0.07)
Basophils Absolute: 0 10*3/uL (ref 0.0–0.1)
Basophils Relative: 0 %
Eosinophils Absolute: 0.4 10*3/uL (ref 0.0–0.5)
Eosinophils Relative: 6 %
HCT: 26.3 % — ABNORMAL LOW (ref 39.0–52.0)
Hemoglobin: 9 g/dL — ABNORMAL LOW (ref 13.0–17.0)
Immature Granulocytes: 0 %
Lymphocytes Relative: 31 %
Lymphs Abs: 2.1 10*3/uL (ref 0.7–4.0)
MCH: 31.4 pg (ref 26.0–34.0)
MCHC: 34.2 g/dL (ref 30.0–36.0)
MCV: 91.6 fL (ref 80.0–100.0)
Monocytes Absolute: 0.5 10*3/uL (ref 0.1–1.0)
Monocytes Relative: 8 %
Neutro Abs: 3.7 10*3/uL (ref 1.7–7.7)
Neutrophils Relative %: 55 %
Platelets: 193 10*3/uL (ref 150–400)
RBC: 2.87 MIL/uL — ABNORMAL LOW (ref 4.22–5.81)
RDW: 12.5 % (ref 11.5–15.5)
WBC: 6.7 10*3/uL (ref 4.0–10.5)
nRBC: 0 % (ref 0.0–0.2)

## 2022-07-05 LAB — GLUCOSE, CAPILLARY
Glucose-Capillary: 107 mg/dL — ABNORMAL HIGH (ref 70–99)
Glucose-Capillary: 108 mg/dL — ABNORMAL HIGH (ref 70–99)
Glucose-Capillary: 115 mg/dL — ABNORMAL HIGH (ref 70–99)
Glucose-Capillary: 94 mg/dL (ref 70–99)
Glucose-Capillary: 98 mg/dL (ref 70–99)

## 2022-07-05 LAB — COMPREHENSIVE METABOLIC PANEL
ALT: 29 U/L (ref 0–44)
AST: 15 U/L (ref 15–41)
Albumin: 2.9 g/dL — ABNORMAL LOW (ref 3.5–5.0)
Alkaline Phosphatase: 69 U/L (ref 38–126)
Anion gap: 9 (ref 5–15)
BUN: 11 mg/dL (ref 6–20)
CO2: 26 mmol/L (ref 22–32)
Calcium: 8.6 mg/dL — ABNORMAL LOW (ref 8.9–10.3)
Chloride: 105 mmol/L (ref 98–111)
Creatinine, Ser: 0.48 mg/dL — ABNORMAL LOW (ref 0.61–1.24)
GFR, Estimated: >60 mL/min (ref >60)
Glucose, Bld: 114 mg/dL — ABNORMAL HIGH (ref 70–99)
Potassium: 3.5 mmol/L (ref 3.5–5.1)
Sodium: 140 mmol/L (ref 135–145)
Total Bilirubin: 0.3 mg/dL (ref 0.3–1.2)
Total Protein: 6.2 g/dL — ABNORMAL LOW (ref 6.5–8.1)

## 2022-07-05 LAB — CULTURE, BLOOD (ROUTINE X 2)
Culture: NO GROWTH
Culture: NO GROWTH

## 2022-07-05 LAB — MAGNESIUM: Magnesium: 2.1 mg/dL (ref 1.7–2.4)

## 2022-07-05 LAB — PHOSPHORUS: Phosphorus: 3.9 mg/dL (ref 2.5–4.6)

## 2022-07-05 MED ORDER — FUROSEMIDE 10 MG/ML IJ SOLN
40.0000 mg | Freq: Once | INTRAMUSCULAR | Status: AC
  Administered 2022-07-05: 40 mg via INTRAVENOUS
  Filled 2022-07-05: qty 4

## 2022-07-05 MED ORDER — POTASSIUM CHLORIDE 20 MEQ PO PACK
40.0000 meq | PACK | Freq: Two times a day (BID) | ORAL | Status: AC
  Administered 2022-07-05 (×2): 40 meq
  Filled 2022-07-05 (×2): qty 2

## 2022-07-05 MED ORDER — SODIUM CHLORIDE 0.9 % IV SOLN
2000.0000 mg | Freq: Once | INTRAVENOUS | Status: AC
  Administered 2022-07-05: 2000 mg via INTRAVENOUS
  Filled 2022-07-05: qty 20

## 2022-07-05 MED ORDER — IPRATROPIUM-ALBUTEROL 0.5-2.5 (3) MG/3ML IN SOLN
3.0000 mL | Freq: Two times a day (BID) | RESPIRATORY_TRACT | Status: DC
  Administered 2022-07-05 – 2022-07-06 (×3): 3 mL via RESPIRATORY_TRACT
  Filled 2022-07-05 (×3): qty 3

## 2022-07-05 MED ORDER — LEVETIRACETAM 500 MG PO TABS
500.0000 mg | ORAL_TABLET | Freq: Two times a day (BID) | ORAL | Status: DC
  Administered 2022-07-06 – 2022-07-14 (×17): 500 mg
  Filled 2022-07-05 (×17): qty 1

## 2022-07-05 MED ORDER — METHYLPHENIDATE HCL 5 MG PO TABS
5.0000 mg | ORAL_TABLET | Freq: Two times a day (BID) | ORAL | Status: DC
Start: 2022-07-06 — End: 2022-07-14
  Administered 2022-07-06 – 2022-07-14 (×18): 5 mg
  Filled 2022-07-05 (×20): qty 1

## 2022-07-05 NOTE — Progress Notes (Signed)
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 (25-35 uV) seen predominantly in posterior head regions, symmetric and reactive to eye opening and eye closing. EEG showed intermittent generalized 3 to 6 Hz theta-delta slowing. Hyperventilation and photic stimulation were not performed.   ABNORMALITY - Intermittent slow, generalized IMPRESSION: This study is suggestive of mild diffuse encephalopathy, nonspecific etiology. No seizures or epileptiform discharges were seen throughout the recording. Priyanka Annabelle Harman     Scheduled Meds:  azithromycin  250 mg Per Tube Daily   Chlorhexidine Gluconate Cloth  6 each Topical Daily   feeding supplement (PROSource TF)  45 mL Per Tube QID   free water  200 mL Per Tube Q8H   guaiFENesin-dextromethorphan  15 mL Per Tube Q6H   heparin injection (subcutaneous)  5,000 Units Subcutaneous Q8H   ipratropium-albuterol  3 mL Nebulization BID    methylphenidate  15 mg Per Tube BID WC   mouth rinse  15 mL Mouth Rinse 4 times per day   pantoprazole (PROTONIX) IV  40 mg Intravenous Q24H   potassium chloride  40 mEq Per Tube BID   propranolol  50 mg Per Tube QID   QUEtiapine  25 mg Per Tube QHS   saccharomyces boulardii  250 mg Per Tube BID   vancomycin  125 mg Per Tube Q6H   Continuous Infusions:  sodium chloride Stopped (07/01/22 1630)   ceFEPime (MAXIPIME) IV 2 g (07/05/22 1026)   feeding supplement (JEVITY 1.5 CAL/FIBER) 1,000 mL (07/04/22 1101)    LOS: 5 days   Marguerita Merles, DO [facility] Available via Epic secure chat 7am-7pm After these hours, please refer to coverage provider listed on [email or web address] 07/05/2022, 4:19 PM

## 2022-07-05 NOTE — Progress Notes (Signed)
ON CALL PHONE CONSULT  Call from Dr Jillyn Ledger @ [facility]  Discussion Patient with TBI and multiple episodes now concerning for seizures.  Not on antiepileptics-initially was started on Depakote but was stopped due to hyperammonemia. Given the multiplicity of events and the history of TBI, I would recommend starting him on Keppra.  I would order a 2 g IV load followed by 500 twice daily through his tube. Maintain seizure precautions Should he continue to have more seizure-like episodes, please call us back and we might want to hook him up to LTM EEG to further classify and characterize seizures. I would also recommend reducing the stimulant-Ritalin-she is on 15 mg twice daily to 5 mg twice daily as the stimulants can reduce seizure threshold. Follow-up with outpatient neurology in 8 to 12 weeks.  -- Milon Dikes, MD Neurologist [facility] Pager: [phone]

## 2022-07-06 ENCOUNTER — Telehealth (HOSPITAL_COMMUNITY): Payer: Self-pay | Admitting: Pharmacy Technician

## 2022-07-06 ENCOUNTER — Other Ambulatory Visit (HOSPITAL_COMMUNITY): Payer: Self-pay

## 2022-07-06 ENCOUNTER — Inpatient Hospital Stay (HOSPITAL_COMMUNITY): Payer: Medicaid Other

## 2022-07-06 DIAGNOSIS — K921 Melena: Secondary | ICD-10-CM

## 2022-07-06 DIAGNOSIS — J9503 Malfunction of tracheostomy stoma: Secondary | ICD-10-CM

## 2022-07-06 DIAGNOSIS — F17210 Nicotine dependence, cigarettes, uncomplicated: Secondary | ICD-10-CM

## 2022-07-06 LAB — COMPREHENSIVE METABOLIC PANEL
ALT: 29 U/L (ref 0–44)
AST: 17 U/L (ref 15–41)
Albumin: 3.2 g/dL — ABNORMAL LOW (ref 3.5–5.0)
Alkaline Phosphatase: 70 U/L (ref 38–126)
Anion gap: 9 (ref 5–15)
BUN: 16 mg/dL (ref 6–20)
CO2: 27 mmol/L (ref 22–32)
Calcium: 9 mg/dL (ref 8.9–10.3)
Chloride: 106 mmol/L (ref 98–111)
Creatinine, Ser: 0.52 mg/dL — ABNORMAL LOW (ref 0.61–1.24)
GFR, Estimated: >60 mL/min (ref >60)
Glucose, Bld: 117 mg/dL — ABNORMAL HIGH (ref 70–99)
Potassium: 3.9 mmol/L (ref 3.5–5.1)
Sodium: 142 mmol/L (ref 135–145)
Total Bilirubin: 0.4 mg/dL (ref 0.3–1.2)
Total Protein: 6.5 g/dL (ref 6.5–8.1)

## 2022-07-06 LAB — GLUCOSE, CAPILLARY
Glucose-Capillary: 104 mg/dL — ABNORMAL HIGH (ref 70–99)
Glucose-Capillary: 110 mg/dL — ABNORMAL HIGH (ref 70–99)
Glucose-Capillary: 113 mg/dL — ABNORMAL HIGH (ref 70–99)
Glucose-Capillary: 122 mg/dL — ABNORMAL HIGH (ref 70–99)
Glucose-Capillary: 90 mg/dL (ref 70–99)
Glucose-Capillary: 94 mg/dL (ref 70–99)

## 2022-07-06 LAB — CBC WITH DIFFERENTIAL/PLATELET
Abs Immature Granulocytes: 0.06 10*3/uL (ref 0.00–0.07)
Basophils Absolute: 0 10*3/uL (ref 0.0–0.1)
Basophils Relative: 1 %
Eosinophils Absolute: 0.4 10*3/uL (ref 0.0–0.5)
Eosinophils Relative: 6 %
HCT: 29.3 % — ABNORMAL LOW (ref 39.0–52.0)
Hemoglobin: 10 g/dL — ABNORMAL LOW (ref 13.0–17.0)
Immature Granulocytes: 1 %
Lymphocytes Relative: 33 %
Lymphs Abs: 2.1 10*3/uL (ref 0.7–4.0)
MCH: 31.3 pg (ref 26.0–34.0)
MCHC: 34.1 g/dL (ref 30.0–36.0)
MCV: 91.6 fL (ref 80.0–100.0)
Monocytes Absolute: 0.6 10*3/uL (ref 0.1–1.0)
Monocytes Relative: 9 %
Neutro Abs: 3.2 10*3/uL (ref 1.7–7.7)
Neutrophils Relative %: 50 %
Platelets: 214 10*3/uL (ref 150–400)
RBC: 3.2 MIL/uL — ABNORMAL LOW (ref 4.22–5.81)
RDW: 12.8 % (ref 11.5–15.5)
WBC: 6.3 10*3/uL (ref 4.0–10.5)
nRBC: 0 % (ref 0.0–0.2)

## 2022-07-06 LAB — PHOSPHORUS: Phosphorus: 4.4 mg/dL (ref 2.5–4.6)

## 2022-07-06 LAB — MAGNESIUM: Magnesium: 2.3 mg/dL (ref 1.7–2.4)

## 2022-07-06 MED ORDER — IPRATROPIUM-ALBUTEROL 0.5-2.5 (3) MG/3ML IN SOLN
3.0000 mL | Freq: Four times a day (QID) | RESPIRATORY_TRACT | Status: DC | PRN
  Filled 2022-07-06: qty 3

## 2022-07-06 MED ORDER — LEVALBUTEROL HCL 0.63 MG/3ML IN NEBU
0.6300 mg | INHALATION_SOLUTION | Freq: Four times a day (QID) | RESPIRATORY_TRACT | Status: DC
  Administered 2022-07-06: 0.63 mg via RESPIRATORY_TRACT
  Filled 2022-07-06: qty 3

## 2022-07-06 MED ORDER — LEVALBUTEROL HCL 0.63 MG/3ML IN NEBU
0.6300 mg | INHALATION_SOLUTION | Freq: Three times a day (TID) | RESPIRATORY_TRACT | Status: DC
  Administered 2022-07-06: 0.63 mg via RESPIRATORY_TRACT
  Filled 2022-07-06: qty 3

## 2022-07-06 MED ORDER — IPRATROPIUM BROMIDE 0.02 % IN SOLN
0.5000 mg | Freq: Three times a day (TID) | RESPIRATORY_TRACT | Status: DC
  Administered 2022-07-06: 0.5 mg via RESPIRATORY_TRACT
  Filled 2022-07-06: qty 2.5

## 2022-07-06 MED ORDER — IPRATROPIUM BROMIDE 0.02 % IN SOLN
0.5000 mg | Freq: Two times a day (BID) | RESPIRATORY_TRACT | Status: DC
  Administered 2022-07-07 – 2022-07-14 (×15): 0.5 mg via RESPIRATORY_TRACT
  Filled 2022-07-06 (×15): qty 2.5

## 2022-07-06 MED ORDER — IPRATROPIUM BROMIDE 0.02 % IN SOLN
0.5000 mg | Freq: Four times a day (QID) | RESPIRATORY_TRACT | Status: DC
  Administered 2022-07-06: 0.5 mg via RESPIRATORY_TRACT
  Filled 2022-07-06: qty 2.5

## 2022-07-06 MED ORDER — APIXABAN 5 MG PO TABS
5.0000 mg | ORAL_TABLET | Freq: Two times a day (BID) | ORAL | Status: DC
  Administered 2022-07-06 – 2022-07-14 (×16): 5 mg
  Filled 2022-07-06 (×16): qty 1

## 2022-07-06 MED ORDER — LEVALBUTEROL HCL 0.63 MG/3ML IN NEBU: 0.6300 mg | INHALATION_SOLUTION | Freq: Four times a day (QID) | RESPIRATORY_TRACT | Status: DC

## 2022-07-06 MED ORDER — IPRATROPIUM BROMIDE 0.02 % IN SOLN: 0.5000 mg | Freq: Four times a day (QID) | RESPIRATORY_TRACT | Status: DC

## 2022-07-06 MED ORDER — LEVALBUTEROL HCL 0.63 MG/3ML IN NEBU
0.6300 mg | INHALATION_SOLUTION | Freq: Two times a day (BID) | RESPIRATORY_TRACT | Status: DC
  Administered 2022-07-07 – 2022-07-14 (×15): 0.63 mg via RESPIRATORY_TRACT
  Filled 2022-07-06 (×15): qty 3

## 2022-07-06 NOTE — Progress Notes (Signed)
ANTICOAGULATION CONSULT NOTE - Initial Consult  Pharmacy Consult for reinitiation of Eliquis  Indication: DVT   Allergies  Allergen Reactions   Peanut-Containing Drug Products Other (See Comments)    Extreme stomach cramps; avoids nuts and seeds due to history of diverticulosis   Penicillins Swelling    Vital Signs: Temp: 97.5 F (36.4 C) (07/17 1600) Temp Source: Oral (07/17 1600) BP: 99/89 (07/17 1600) Pulse Rate: 88 (07/17 1600)  Labs: Recent Labs    07/04/22 0035 07/05/22 0653 07/06/22 0658  HGB 9.5* 9.0* 10.0*  HCT 28.5* 26.3* 29.3*  PLT 178 193 214  CREATININE 0.53* 0.48* 0.52*    Estimated Creatinine Clearance: 160.9 mL/min (A) (by C-G formula based on SCr of 0.52 mg/dL (L)).   Medical History: Past Medical History:  Diagnosis Date   C. difficile colitis    DVT (deep venous thrombosis) (HCC)    Hypertension    Iron deficiency    TBI (traumatic brain injury) (HCC)     Medications:  Scheduled:   Chlorhexidine Gluconate Cloth  6 each Topical Daily   feeding supplement (PROSource TF)  45 mL Per Tube QID   free water  200 mL Per Tube Q8H   guaiFENesin-dextromethorphan  15 mL Per Tube Q6H   ipratropium  0.5 mg Nebulization TID   levalbuterol  0.63 mg Nebulization TID   levETIRAcetam  500 mg Per Tube BID   methylphenidate  5 mg Per Tube BID WC   mouth rinse  15 mL Mouth Rinse 4 times per day   pantoprazole (PROTONIX) IV  40 mg Intravenous Q24H   propranolol  50 mg Per Tube QID   QUEtiapine  25 mg Per Tube QHS   saccharomyces boulardii  250 mg Per Tube BID   vancomycin  125 mg Per Tube Q6H    Assessment: Patient was on Eliquis 5mg  BID following a DVT. Eliquis was held due to concern for GI bleeding. GI has okay'd patient to restart Eliquis and signed off.  SubQ heparin d/c'd  Goal of Therapy:  Continue maintenance phase of anticoagulation  Monitor platelets by anticoagulation protocol: Yes   Plan:  Restart Eliquis 5 mg BID tonight  Monitor for  s/sx of bleeding, trend H&H and platelets, monitor SCr  , PharmD  PGY1 Pharmacy Resident
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
## 2022-07-06 NOTE — Telephone Encounter (Addendum)
Pharmacy Patient Advocate Encounter  Insurance verification completed.    The patient is insured through [facility]  The patient is currently admitted and ran test claims for the following: Vancomycin.  Copays and coinsurance results were relayed to Inpatient clinical team.
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
## 2022-07-06 NOTE — Progress Notes (Signed)
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 at [facility], [place] [address], [place], [place] [address]     Radiology Studies: DG CHEST PORT 1 VIEW  Result Date: 07/06/2022 CLINICAL DATA:  Aspiration pneumonia. EXAM: PORTABLE CHEST 1 VIEW COMPARISON:  Portable chest yesterday at 6:24 a.m. FINDINGS: 5:45 a.m. A low inspiration is again noted. The cardiac size is normal for low inspiration. There is patchy airspace disease of the mid to lower lung fields, interval worsened in both mid perihilar areas. The apical 1/3 of the lungs remain clear. The sulci are sharp. Stable mediastinum. IMPRESSION: Worsening mid perihilar airspace disease. No interval change in the lower zonal opacities with low lung volumes. Electronically Signed   By: Telford Nab M.D.   On: 07/06/2022 07:54   MR BRAIN WO CONTRAST  Result Date: 07/06/2022 CLINICAL DATA:  Initial evaluation for mental status change, seizure like activity. EXAM: MRI HEAD WITHOUT CONTRAST TECHNIQUE: Multiplanar, multiecho  pulse sequences of the brain and surrounding structures were obtained without intravenous contrast. COMPARISON:  Prior CT from 06/13/2022 and MRI from 03/16/2022. FINDINGS: Brain: Cerebral volume stable, and remains within normal limits. No diffusion signal abnormality to suggest acute or subacute ischemia or changes related to acute seizure. Gray-white matter differentiation maintained. No areas of chronic cortical infarction. No acute intracranial hemorrhage. Again seen are multiple scattered foci of susceptibility artifact involving the bilateral cerebral hemispheres, with additional involvement of the brainstem, consistent with history of prior closed head injury. No mass lesion, midline shift or mass effect. No hydrocephalus or extra-axial fluid collection. Pituitary gland and suprasellar region within normal limits. No intrinsic temporal lobe abnormality. Vascular: Major intracranial vascular flow voids are maintained. Skull and upper cervical spine: Craniocervical junction within normal limits. Bone marrow signal intensity grossly within normal limits. No scalp soft tissue abnormality. Sinuses/Orbits: Globes and orbital soft tissues demonstrate no acute finding. Mild scattered mucosal thickening noted about the sphenoid ethmoidal sinuses. Moderate bilateral mastoid effusions. Visualized nasopharynx unremarkable. Other: None. IMPRESSION: 1. No acute intracranial abnormality. 2. Multiple scattered chronic micro hemorrhages involving the bilateral cerebral hemispheres and brainstem, consistent with history of prior traumatic brain injury. 3. Moderate bilateral mastoid effusions, of uncertain significance. Correlation with physical exam recommended. Electronically Signed   By: Jeannine Boga M.D.   On: 07/06/2022 00:22   DG CHEST PORT 1 VIEW  Result Date: 07/05/2022 CLINICAL DATA:  Shortness of breath. EXAM: PORTABLE CHEST 1 VIEW COMPARISON:  07/04/2022 FINDINGS: Heart size is normal. Low lung volumes  are again seen. Worsening opacity in the right lung base may be due to worsening infiltrate or atelectasis. Diffuse interstitial prominence again noted, and suspicious for interstitial edema. IMPRESSION: Low lung volumes, with worsening infiltrate or atelectasis in right lung base. Stable diffuse interstitial prominence, suspicious for interstitial edema. Electronically Signed   By: Marlaine Hind M.D.   On: 07/05/2022 09:14    Scheduled Meds:  Chlorhexidine Gluconate Cloth  6 each Topical Daily   feeding supplement (PROSource TF)  45 mL Per Tube QID   free water  200 mL Per Tube Q8H   guaiFENesin-dextromethorphan  15 mL  Per Tube Q6H   ipratropium  0.5 mg Nebulization TID   levalbuterol  0.63 mg Nebulization TID   levETIRAcetam  500 mg Per Tube BID   methylphenidate  5 mg Per Tube BID WC   mouth rinse  15 mL Mouth Rinse 4 times per day   pantoprazole (PROTONIX) IV  40 mg Intravenous Q24H   propranolol  50 mg Per Tube QID   QUEtiapine  25 mg Per Tube QHS   saccharomyces boulardii  250 mg Per Tube BID   vancomycin  125 mg Per Tube Q6H   Continuous Infusions:  sodium chloride Stopped (07/01/22 1630)   ceFEPime (MAXIPIME) IV 2 g (07/06/22 1711)   feeding supplement (JEVITY 1.5 CAL/FIBER) 1,000 mL (07/06/22 1311)    LOS: 6 days   Raiford Noble, DO [facility] Available via Epic secure chat 7am-7pm After these hours, please refer to coverage provider listed on [email or web address] 07/06/2022, 5:25 PM

## 2022-07-06 NOTE — Progress Notes (Signed)
CM spoke w/ [facility] and confirmed appointment for Tomorrow 07/07/22 at Noon-1pm for wheelchair sizing/fitting w/ PT.
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
## 2022-07-06 NOTE — TOC Benefit Eligibility Note (Signed)
Patient Product/process development scientist completed.    The patient is currently admitted and upon discharge could be taking vancomycin 125 mg capsules.  The current 10 day co-pay is, $4.00.   The patient is insured through [facility]     Roland Earl, CPhT Pharmacy Patient Advocate Specialist [facility] Patient Advocate Team Direct Number: [phone]  Fax: [phone]
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
## 2022-07-06 NOTE — Progress Notes (Signed)
Inpatient Rehab Admissions Coordinator: I  Discussed case with CIR therapy team and Dr. Riley Kill.  Both feel pt would benefit from brief return to CIR for family education and finalization of DME recommendations.  I've spoken with Donn Pierini, RN CM, and I will place an order for rehab consult.  I will speak to pt's s/o [name], to update, and I will start insurance auth process today.    Estill Dooms, PT, DPT Admissions Coordinator [phone] 07/06/22  1:55 PM

## 2022-07-07 ENCOUNTER — Inpatient Hospital Stay (HOSPITAL_COMMUNITY): Payer: Medicaid Other

## 2022-07-07 LAB — CBC WITH DIFFERENTIAL/PLATELET
Abs Immature Granulocytes: 0.11 10*3/uL — ABNORMAL HIGH (ref 0.00–0.07)
Basophils Absolute: 0.1 10*3/uL (ref 0.0–0.1)
Basophils Relative: 1 %
Eosinophils Absolute: 0.4 10*3/uL (ref 0.0–0.5)
Eosinophils Relative: 4 %
HCT: 31.2 % — ABNORMAL LOW (ref 39.0–52.0)
Hemoglobin: 10.6 g/dL — ABNORMAL LOW (ref 13.0–17.0)
Immature Granulocytes: 1 %
Lymphocytes Relative: 22 %
Lymphs Abs: 2.1 10*3/uL (ref 0.7–4.0)
MCH: 31.4 pg (ref 26.0–34.0)
MCHC: 34 g/dL (ref 30.0–36.0)
MCV: 92.3 fL (ref 80.0–100.0)
Monocytes Absolute: 0.9 10*3/uL (ref 0.1–1.0)
Monocytes Relative: 10 %
Neutro Abs: 5.9 10*3/uL (ref 1.7–7.7)
Neutrophils Relative %: 62 %
Platelets: 244 10*3/uL (ref 150–400)
RBC: 3.38 MIL/uL — ABNORMAL LOW (ref 4.22–5.81)
RDW: 12.8 % (ref 11.5–15.5)
WBC: 9.4 10*3/uL (ref 4.0–10.5)
nRBC: 0 % (ref 0.0–0.2)

## 2022-07-07 LAB — COMPREHENSIVE METABOLIC PANEL
ALT: 28 U/L (ref 0–44)
AST: 17 U/L (ref 15–41)
Albumin: 3.3 g/dL — ABNORMAL LOW (ref 3.5–5.0)
Alkaline Phosphatase: 72 U/L (ref 38–126)
Anion gap: 10 (ref 5–15)
BUN: 15 mg/dL (ref 6–20)
CO2: 27 mmol/L (ref 22–32)
Calcium: 9.2 mg/dL (ref 8.9–10.3)
Chloride: 101 mmol/L (ref 98–111)
Creatinine, Ser: 0.57 mg/dL — ABNORMAL LOW (ref 0.61–1.24)
GFR, Estimated: >60 mL/min (ref >60)
Glucose, Bld: 124 mg/dL — ABNORMAL HIGH (ref 70–99)
Potassium: 3.6 mmol/L (ref 3.5–5.1)
Sodium: 138 mmol/L (ref 135–145)
Total Bilirubin: 0.2 mg/dL — ABNORMAL LOW (ref 0.3–1.2)
Total Protein: 6.8 g/dL (ref 6.5–8.1)

## 2022-07-07 LAB — GLUCOSE, CAPILLARY
Glucose-Capillary: 105 mg/dL — ABNORMAL HIGH (ref 70–99)
Glucose-Capillary: 115 mg/dL — ABNORMAL HIGH (ref 70–99)
Glucose-Capillary: 116 mg/dL — ABNORMAL HIGH (ref 70–99)
Glucose-Capillary: 118 mg/dL — ABNORMAL HIGH (ref 70–99)
Glucose-Capillary: 130 mg/dL — ABNORMAL HIGH (ref 70–99)
Glucose-Capillary: 135 mg/dL — ABNORMAL HIGH (ref 70–99)

## 2022-07-07 LAB — MAGNESIUM: Magnesium: 2.1 mg/dL (ref 1.7–2.4)

## 2022-07-07 LAB — PHOSPHORUS: Phosphorus: 3.9 mg/dL (ref 2.5–4.6)

## 2022-07-07 MED ORDER — IOHEXOL 300 MG/ML  SOLN
100.0000 mL | Freq: Once | INTRAMUSCULAR | Status: AC | PRN
  Administered 2022-07-07: 100 mL via INTRAVENOUS

## 2022-07-07 MED ORDER — FUROSEMIDE 10 MG/ML IJ SOLN
40.0000 mg | Freq: Once | INTRAMUSCULAR | Status: AC
  Administered 2022-07-07: 40 mg via INTRAVENOUS
  Filled 2022-07-07: qty 4

## 2022-07-07 NOTE — Progress Notes (Signed)
Nutrition Follow-up  DOCUMENTATION CODES:   Non-severe (moderate) malnutrition in context of chronic illness  INTERVENTION:   Tube feeding via PEG: Jevity 1.5 at 80 ml/hr (1920 ml per day) Prosource TF 45 ml QID  Provides 3040 kcal, 166 gm protein, 1459 ml free water daily  Free water flushes 200 ml q 8 hours for a total of 2059 ml daily.  Weekly weights.   NUTRITION DIAGNOSIS:   Moderate Malnutrition related to chronic illness (TBI) as evidenced by moderate fat depletion, moderate muscle depletion, severe muscle depletion.  Ongoing   GOAL:   Patient will meet greater than or equal to 90% of their needs  Met with TF  MONITOR:   TF tolerance  REASON FOR ASSESSMENT:   Consult Enteral/tube feeding initiation and management  ASSESSMENT:   Pt with PMH of recent severe TBI admitted 02/2022, tx to rehab 04/2022 now readmitted with aspiration PNA.   Discussed patient with RN today. He is tolerating TF without difficulty. Current TF is meeting 100% of patient's estimated nutrition needs. He remains NPO.    Medications reviewed and include Keppra, Florastor.  Labs reviewed. CBG: 116-130-118  No new weight available since 7/10. Recommend obtain weekly weights to assess TF adequacy.  Diet Order:   Diet Order             Diet NPO time specified  Diet effective now                   EDUCATION NEEDS:   Not appropriate for education at this time  Skin:  Skin Assessment: Reviewed RN Assessment  Last BM:  7/18 type 7  Height:   Ht Readings from Last 1 Encounters:  05/11/22 6' 5"  (1.956 m)    Weight:   Wt Readings from Last 1 Encounters:  06/29/22 100.7 kg    Ideal Body Weight:   94.5 kg   BMI:  There is no height or weight on file to calculate BMI.  Estimated Nutritional Needs:   Kcal:  2800-3000  Protein:  150-170 grams  Fluid:  >2.8 L/day   Lucas Mallow RD, LDN, CNSC Please refer to Amion for contact information.

## 2022-07-07 NOTE — Progress Notes (Signed)
Inpatient Rehab Admissions Coordinator:   Met with pt and s/o, [name], to discuss return to [facility] pending insurance approval.  Sent updated clinicals today.  Will continue to follow for determination and timing of potential admit pending approval, medical clearance, and bed availability.   Shann Medal, PT, DPT Admissions Coordinator [phone] 07/07/22  1:54 PM
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
## 2022-07-07 NOTE — Progress Notes (Signed)
Pt unable to cough to clear secretions after chest vest. NT suctioning was performed on left nare.

## 2022-07-07 NOTE — Progress Notes (Signed)
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  5:26 AM   Specimen: Expectorated Sputum  Result Value Ref Range Status   Specimen Description EXPECTORATED SPUTUM  Final   Special Requests NONE  Final  Sputum evaluation   Final    THIS SPECIMEN IS ACCEPTABLE FOR SPUTUM CULTURE Performed at [facility], [address], [place], [place] [address]    Report Status 07/04/2022 FINAL  Final  Culture, Respiratory w Gram Stain     Status: None   Collection Time: 06/30/22  5:26 AM  Result Value Ref Range Status   Specimen Description EXPECTORATED SPUTUM  Final   Special Requests NONE Reflexed from [id number]  Final   Gram Stain   Final    ABUNDANT WBC PRESENT,BOTH PMN AND MONONUCLEAR MODERATE GRAM NEGATIVE RODS    Culture   Final    Normal respiratory flora-no Staph aureus or Pseudomonas seen Performed at [facility], [address], [place], [place] [address]    Report Status 07/02/2022 FINAL  Final  Culture, blood (Routine X 2) w Reflex to ID Panel     Status: None    Collection Time: 06/30/22  5:59 AM   Specimen: BLOOD RIGHT HAND  Result Value Ref Range Status   Specimen Description BLOOD RIGHT HAND  Final   Special Requests   Final    BOTTLES DRAWN AEROBIC AND ANAEROBIC Blood Culture results may not be optimal due to an excessive volume of blood received in culture bottles   Culture   Final    NO GROWTH 5 DAYS Performed at [facility], [address], [place], [place] [address]    Report Status 07/05/2022 FINAL  Final  Culture, blood (Routine X 2) w Reflex to ID Panel     Status: None   Collection Time: 06/30/22  5:59 AM   Specimen: BLOOD LEFT HAND  Result Value Ref Range Status   Specimen Description BLOOD LEFT HAND  Final   Special Requests   Final    BOTTLES DRAWN AEROBIC AND ANAEROBIC Blood Culture results may not be optimal due to an excessive volume of blood received in culture bottles   Culture   Final    NO GROWTH 5 DAYS Performed at [facility], [address], [place], [place] [address]    Report Status 07/05/2022 FINAL  Final  MRSA Next Gen by PCR, Nasal     Status: None   Collection Time: 06/30/22  8:35 AM   Specimen: Nasal Mucosa; Nasal Swab  Result Value Ref Range Status   MRSA by PCR Next Gen NOT DETECTED NOT DETECTED Final    Comment: (NOTE) The GeneXpert MRSA Assay (FDA approved for NASAL specimens only), is one component of a comprehensive MRSA colonization surveillance program. It is not intended to diagnose MRSA infection nor to guide or monitor treatment for MRSA infections. Test performance is not FDA approved in patients less than 87 years old. Performed at [facility], [address] [place], [place] [address]     Radiology Studies: CT SOFT TISSUE NECK W CONTRAST  Result Date: 07/07/2022 CLINICAL DATA:  Subglottic tracheal narrowing. EXAM: CT NECK WITH CONTRAST TECHNIQUE: Multidetector CT imaging of the neck was performed using the standard protocol following the bolus administration of  intravenous contrast. RADIATION DOSE REDUCTION: This exam was performed according to the departmental dose-optimization program which includes automated exposure control, adjustment of the mA and/or kV according to patient size and/or use of iterative reconstruction technique. CONTRAST:  OMNIPAQUE IOHEXOL 300 MG/ML  SOLN COMPARISON:  CT chest obtained earlier the same day, CTA neck 03/11/2019. FINDINGS: Pharynx and larynx: The nasal cavity and nasopharynx are unremarkable. The oral  cavity and oropharynx are unremarkable. The parapharyngeal spaces are clear. The hypopharynx and larynx are unremarkable. The vocal folds are normal in appearance. There is no retropharyngeal fluid collection. There is no abnormal enhancement or soft tissue lesion. Salivary glands: The parotid and submandibular glands are unremarkable. Thyroid: Unremarkable. Lymph nodes: There is no pathologic lymphadenopathy in the neck. Vascular: The major vasculature of the neck is unremarkable. Limited intracranial: The imaged intracranial compartment is unremarkable. Visualized orbits: The imaged globes and orbits are unremarkable. Mastoids and visualized paranasal sinuses: The imaged paranasal sinuses and mastoid air cells are clear. Skeleton: There is no acute osseous abnormality or suspicious osseous lesion. Upper chest: Again seen is narrowing of the subglottic airway with the upper trachea measuring 8 mm in transverse diameter at the level of the thoracic inlet. The trachea measures approximately 2.0 cm proximal and 1.8 cm distal to this area. There is debris in the upper trachea. The imaged lung apices are clear. Other: None. IMPRESSION: 1. Again seen is narrowing of the subglottic airway measuring up to 8 mm in diameter. The airway proximal and distal to this location are normal in caliber. 2. Debris in the airway.  Correlate with any history of aspiration. 3. Otherwise unremarkable soft tissues of the neck. Electronically Signed   By:  Lesia Hausen M.D.   On: 07/07/2022 10:31   CT CHEST W CONTRAST  Result Date: 07/07/2022 CLINICAL DATA:  Evaluate for pneumonia or complications of pneumonia. EXAM: CT CHEST WITH CONTRAST TECHNIQUE: Multidetector CT imaging of the chest was performed during intravenous contrast administration. RADIATION DOSE REDUCTION: This exam was performed according to the departmental dose-optimization program which includes automated exposure control, adjustment of the mA and/or kV according to patient size and/or use of iterative reconstruction technique. CONTRAST:  OMNIPAQUE IOHEXOL 300 MG/ML  SOLN COMPARISON:  03/10/2022 FINDINGS: Cardiovascular: The heart size appears normal. There is no pericardial effusion. Mediastinum/Nodes: There is a focal area of luminal narrowing involving the proximal trachea at the level of the thoracic inlet just below the thyroid gland. Here, the transverse diameter of the trachea measures 8 mm. Proximal to this segment, the airway measures 2.1 cm in diameter. Distally the airway measures 1.5 cm Thyroid gland is unremarkable.  Normal appearance of the esophagus. No enlarged axillary or supraclavicular lymph nodes. There are prominent bilateral hilar lymph nodes which are favored to represent ridge reactive changes. The right hilar lymph node measures 1.4 cm, image 65/3. Previously 0.9 cm. Lungs/Pleura: No pleural effusion identified. There is respiratory motion artifact which diminishes exam detail throughout the upper and lower lung zones. Within this limitation, there is multifocal airspace opacities within both lungs. This is most severe within the lower lobes. Imaging findings are compatible with multifocal pneumonia and or aspiration. Upper Abdomen: No acute abnormality. Musculoskeletal: Subacute, superior endplate fracture deformity is identified involving the superior endplate the T5 vertebral body with mild superior endplate sclerosis, image 117/7. The appearance is new when  compared with 03/10/2022. No acute osseous findings. IMPRESSION: 1. Multifocal airspace opacities within both lungs compatible with multifocal pneumonia and/or aspiration. 2. Focal area of luminal narrowing involving the proximal trachea at the level of the thoracic inlet (just below the thyroid gland). Here, the airway has a transverse diameter of 8 mm. The proximal airway measures 2.1 cm in diameter. Distally the airway measures 1.5 cm in diameter. 3. Mild, subacute, superior endplate fracture deformity is identified involving the superior endplate of T5 vertebral body with mild superior endplate sclerosis. Electronically  Signed   By: Signa Kell M.D.   On: 07/07/2022 10:09   DG CHEST PORT 1 VIEW  Result Date: 07/07/2022 CLINICAL DATA:  Shortness of breath, cough EXAM: PORTABLE CHEST 1 VIEW COMPARISON:  Previous studies including the examination of 07/06/2022 FINDINGS: Transverse diameter heart is in the upper limits of normal. Central pulmonary vessels are less prominent. There are patchy alveolar and interstitial densities in the parahilar regions and lower lung fields. There is poor inspiration. Costophrenic angles are clear. There is no pneumothorax. There is narrowing of the trachea in the lower neck, possibly due to enlarged thyroid or stenosis due to some other reason. IMPRESSION: Central pulmonary vessels are less prominent. Residual interstitial and alveolar densities are seen in the parahilar regions, more so on the right side and in the lower lung fields suggesting asymmetric pulmonary edema or multifocal pneumonia. There is narrowing of transverse diameter of the trachea in the lower neck. Electronically Signed   By: Ernie Avena M.D.   On: 07/07/2022 08:23   DG CHEST PORT 1 VIEW  Result Date: 07/06/2022 CLINICAL DATA:  Aspiration pneumonia. EXAM: PORTABLE CHEST 1 VIEW COMPARISON:  Portable chest yesterday at 6:24 a.m. FINDINGS: 5:45 a.m. A low inspiration is again noted. The cardiac  size is normal for low inspiration. There is patchy airspace disease of the mid to lower lung fields, interval worsened in both mid perihilar areas. The apical 1/3 of the lungs remain clear. The sulci are sharp. Stable mediastinum. IMPRESSION: Worsening mid perihilar airspace disease. No interval change in the lower zonal opacities with low lung volumes. Electronically Signed   By: Almira Bar M.D.   On: 07/06/2022 07:54   MR BRAIN WO CONTRAST  Result Date: 07/06/2022 CLINICAL DATA:  Initial evaluation for mental status change, seizure like activity. EXAM: MRI HEAD WITHOUT CONTRAST TECHNIQUE: Multiplanar, multiecho pulse sequences of the brain and surrounding structures were obtained without intravenous contrast. COMPARISON:  Prior CT from 06/13/2022 and MRI from 03/16/2022. FINDINGS: Brain: Cerebral volume stable, and remains within normal limits. No diffusion signal abnormality to suggest acute or subacute ischemia or changes related to acute seizure. Gray-white matter differentiation maintained. No areas of chronic cortical infarction. No acute intracranial hemorrhage. Again seen are multiple scattered foci of susceptibility artifact involving the bilateral cerebral hemispheres, with additional involvement of the brainstem, consistent with history of prior closed head injury. No mass lesion, midline shift or mass effect. No hydrocephalus or extra-axial fluid collection. Pituitary gland and suprasellar region within normal limits. No intrinsic temporal lobe abnormality. Vascular: Major intracranial vascular flow voids are maintained. Skull and upper cervical spine: Craniocervical junction within normal limits. Bone marrow signal intensity grossly within normal limits. No scalp soft tissue abnormality. Sinuses/Orbits: Globes and orbital soft tissues demonstrate no acute finding. Mild scattered mucosal thickening noted about the sphenoid ethmoidal sinuses. Moderate bilateral mastoid effusions. Visualized  nasopharynx unremarkable. Other: None. IMPRESSION: 1. No acute intracranial abnormality. 2. Multiple scattered chronic micro hemorrhages involving the bilateral cerebral hemispheres and brainstem, consistent with history of prior traumatic brain injury. 3. Moderate bilateral mastoid effusions, of uncertain significance. Correlation with physical exam recommended. Electronically Signed   By: Rise Mu M.D.   On: 07/06/2022 00:22    Scheduled Meds:  apixaban  5 mg Per Tube BID   Chlorhexidine Gluconate Cloth  6 each Topical Daily   feeding supplement (PROSource TF)  45 mL Per Tube QID   free water  200 mL Per Tube Q8H   guaiFENesin-dextromethorphan  15  mL Per Tube Q6H   ipratropium  0.5 mg Nebulization BID   levalbuterol  0.63 mg Nebulization BID   levETIRAcetam  500 mg Per Tube BID   methylphenidate  5 mg Per Tube BID WC   mouth rinse  15 mL Mouth Rinse 4 times per day   pantoprazole (PROTONIX) IV  40 mg Intravenous Q24H   propranolol  50 mg Per Tube QID   QUEtiapine  25 mg Per Tube QHS   saccharomyces boulardii  250 mg Per Tube BID   vancomycin  125 mg Per Tube Q6H   Continuous Infusions:  sodium chloride Stopped (07/01/22 1630)   feeding supplement (JEVITY 1.5 CAL/FIBER) 1,000 mL (07/06/22 1311)    LOS: 7 days   Marguerita Merles, DO [facility] Available via Epic secure chat 7am-7pm After these hours, please refer to coverage provider listed on [email or web address] 07/07/2022, 7:17 PM

## 2022-07-07 NOTE — Progress Notes (Signed)
SLP Cancellation Note  Patient Details Name: Carl Carpenter MRN: [id number] DOB: [birth year]   Cancelled treatment:       Reason Eval/Treat Not Completed: Other (comment) Representative present for equipment fitting. Will f/u as schedule allows.     Mahala Menghini., M.A. CCC-SLP Acute Rehabilitation Services Office [phone]  Secure chat preferred  07/07/2022, 11:52 AM

## 2022-07-08 ENCOUNTER — Inpatient Hospital Stay (HOSPITAL_COMMUNITY): Payer: Medicaid Other

## 2022-07-08 LAB — BLOOD GAS, ARTERIAL
Acid-Base Excess: 4.8 mmol/L — ABNORMAL HIGH (ref 0.0–2.0)
Bicarbonate: 29.9 mmol/L — ABNORMAL HIGH (ref 20.0–28.0)
O2 Saturation: 98 %
Patient temperature: 37
pCO2 arterial: 45 mmHg (ref 32–48)
pH, Arterial: 7.43 (ref 7.35–7.45)
pO2, Arterial: 88 mmHg (ref 83–108)

## 2022-07-08 LAB — COMPREHENSIVE METABOLIC PANEL
ALT: 27 U/L (ref 0–44)
AST: 15 U/L (ref 15–41)
Albumin: 3.5 g/dL (ref 3.5–5.0)
Alkaline Phosphatase: 76 U/L (ref 38–126)
Anion gap: 9 (ref 5–15)
BUN: 15 mg/dL (ref 6–20)
CO2: 29 mmol/L (ref 22–32)
Calcium: 9.9 mg/dL (ref 8.9–10.3)
Chloride: 100 mmol/L (ref 98–111)
Creatinine, Ser: 0.61 mg/dL (ref 0.61–1.24)
GFR, Estimated: >60 mL/min (ref >60)
Glucose, Bld: 106 mg/dL — ABNORMAL HIGH (ref 70–99)
Potassium: 4.4 mmol/L (ref 3.5–5.1)
Sodium: 138 mmol/L (ref 135–145)
Total Bilirubin: 0.7 mg/dL (ref 0.3–1.2)
Total Protein: 7.6 g/dL (ref 6.5–8.1)

## 2022-07-08 LAB — CBC WITH DIFFERENTIAL/PLATELET
Abs Immature Granulocytes: 0.1 10*3/uL — ABNORMAL HIGH (ref 0.00–0.07)
Basophils Absolute: 0.1 10*3/uL (ref 0.0–0.1)
Basophils Relative: 1 %
Eosinophils Absolute: 0.2 10*3/uL (ref 0.0–0.5)
Eosinophils Relative: 2 %
HCT: 33.7 % — ABNORMAL LOW (ref 39.0–52.0)
Hemoglobin: 11.5 g/dL — ABNORMAL LOW (ref 13.0–17.0)
Immature Granulocytes: 1 %
Lymphocytes Relative: 27 %
Lymphs Abs: 2.9 10*3/uL (ref 0.7–4.0)
MCH: 31.4 pg (ref 26.0–34.0)
MCHC: 34.1 g/dL (ref 30.0–36.0)
MCV: 92.1 fL (ref 80.0–100.0)
Monocytes Absolute: 1.1 10*3/uL — ABNORMAL HIGH (ref 0.1–1.0)
Monocytes Relative: 10 %
Neutro Abs: 6.4 10*3/uL (ref 1.7–7.7)
Neutrophils Relative %: 59 %
Platelets: 284 10*3/uL (ref 150–400)
RBC: 3.66 MIL/uL — ABNORMAL LOW (ref 4.22–5.81)
RDW: 12.7 % (ref 11.5–15.5)
WBC: 10.7 10*3/uL — ABNORMAL HIGH (ref 4.0–10.5)
nRBC: 0 % (ref 0.0–0.2)

## 2022-07-08 LAB — GLUCOSE, CAPILLARY
Glucose-Capillary: 101 mg/dL — ABNORMAL HIGH (ref 70–99)
Glucose-Capillary: 104 mg/dL — ABNORMAL HIGH (ref 70–99)
Glucose-Capillary: 115 mg/dL — ABNORMAL HIGH (ref 70–99)
Glucose-Capillary: 122 mg/dL — ABNORMAL HIGH (ref 70–99)
Glucose-Capillary: 125 mg/dL — ABNORMAL HIGH (ref 70–99)
Glucose-Capillary: 137 mg/dL — ABNORMAL HIGH (ref 70–99)

## 2022-07-08 LAB — PHOSPHORUS: Phosphorus: 4.2 mg/dL (ref 2.5–4.6)

## 2022-07-08 LAB — MAGNESIUM: Magnesium: 2.3 mg/dL (ref 1.7–2.4)
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
## 2022-07-08 NOTE — Progress Notes (Signed)
Inpatient Rehab Admissions Coordinator:   I did receive insurance approval for pt to admit to CIR.  Await medical clearance.  I will not have a bed for this patient to admit today. I spoke to [facility] and to pt's s/o [name] to update.   Estill Dooms, PT, DPT Admissions Coordinator [phone] 07/08/22  12:08 PM
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
## 2022-07-08 NOTE — Progress Notes (Signed)
PT Cancellation Note  Patient Details Name: Carl Carpenter MRN: [id number] DOB: 15-Jun-1986   Cancelled Treatment:    Reason Eval/Treat Not Completed: Other (comment);Fatigue/lethargy limiting ability to participate, pt sleeping unable to be woken, MT and RN present and MD aware. Will check back as schedule allows to continue with PT POC.  Lenora Boys. PTA Acute Rehabilitation Services Office: [phone]    Catalina Antigua 07/08/2022, 2:51 PM

## 2022-07-09 LAB — GLUCOSE, CAPILLARY
Glucose-Capillary: 115 mg/dL — ABNORMAL HIGH (ref 70–99)
Glucose-Capillary: 105 mg/dL — ABNORMAL HIGH (ref 70–99)
Glucose-Capillary: 115 mg/dL — ABNORMAL HIGH (ref 70–99)
Glucose-Capillary: 98 mg/dL (ref 70–99)
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
## 2022-07-09 NOTE — Progress Notes (Signed)
CPT held at this time due to pt sleeping. RT will continue to monitor.

## 2022-07-09 NOTE — Progress Notes (Signed)
Patient ID: Carl Carpenter, male   DOB: 01-Nov-1986, 36 y.o.   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  This is about 8 mm.  It is  not possible to determine if this is going to continue to narrow over more scar time but certainly is possible since the trach was done in March.   If it does get more narrow he will start having stridor and other respiratory issues.  Mucus plugging at the narrowed site is possible with his poor mobility and cough. Right now it does not sound like from the history of the father that he is having any breathing difficulties relative to air movement.  I do not think the tracheal stenosis has anything to do with the aspiration issue.  It would be best to have this tracheal stenosis evaluated by an otolaryngologist that does tracheal stenosis.  I am not sure there is anyone in [place] that does that procedure and evaluation.  It most likely will require a visit to [facility] for a consultation with otolaryngology there.
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
## 2022-07-10 ENCOUNTER — Other Ambulatory Visit: Payer: Self-pay

## 2022-07-10 ENCOUNTER — Encounter (HOSPITAL_COMMUNITY): Payer: Self-pay | Admitting: Internal Medicine

## 2022-07-10 LAB — GLUCOSE, CAPILLARY
Glucose-Capillary: 103 mg/dL — ABNORMAL HIGH (ref 70–99)
Glucose-Capillary: 104 mg/dL — ABNORMAL HIGH (ref 70–99)
Glucose-Capillary: 107 mg/dL — ABNORMAL HIGH (ref 70–99)
Glucose-Capillary: 124 mg/dL — ABNORMAL HIGH (ref 70–99)
Glucose-Capillary: 98 mg/dL (ref 70–99)
Glucose-Capillary: 99 mg/dL (ref 70–99)
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
## 2022-07-10 NOTE — Progress Notes (Signed)
Inpatient Rehab Admissions Coordinator:   I will not have a bed available for this patient through at least Sunday.  Will continue to follow for timing of re-admit pending bed availability.   Estill Dooms, PT, DPT Admissions Coordinator [phone] 07/10/22  9:45 AM

## 2022-07-10 NOTE — Progress Notes (Signed)
ANTICOAGULATION CONSULT NOTE -follow up  Pharmacy Consult for reinitiation of Eliquis  Indication: DVT   Allergies  Allergen Reactions   Peanut-Containing Drug Products Other (See Comments)    Extreme stomach cramps; avoids nuts and seeds due to history of diverticulosis   Penicillins Swelling    Vital Signs: Temp: 97.7 F (36.5 C) (07/21 1255) Temp Source: Oral (07/21 1255) BP: 119/82 (07/21 1255) Pulse Rate: 72 (07/21 0803)  Labs: Recent Labs    07/08/22 0233  HGB 11.5*  HCT 33.7*  PLT 284  CREATININE 0.61     Estimated Creatinine Clearance: 160.9 mL/min (by C-G formula based on SCr of 0.61 mg/dL).   Medical History: Past Medical History:  Diagnosis Date   C. difficile colitis    DVT (deep venous thrombosis) (HCC)    Hypertension    Iron deficiency    TBI (traumatic brain injury) (HCC)     Medications:  Scheduled:   apixaban  5 mg Per Tube BID   Chlorhexidine Gluconate Cloth  6 each Topical Daily   feeding supplement (PROSource TF)  45 mL Per Tube QID   free water  200 mL Per Tube Q8H   guaiFENesin-dextromethorphan  15 mL Per Tube Q6H   ipratropium  0.5 mg Nebulization BID   levalbuterol  0.63 mg Nebulization BID   levETIRAcetam  500 mg Per Tube BID   methylphenidate  5 mg Per Tube BID WC   mouth rinse  15 mL Mouth Rinse 4 times per day   propranolol  50 mg Per Tube QID   QUEtiapine  25 mg Per Tube QHS   saccharomyces boulardii  250 mg Per Tube BID   vancomycin  125 mg Per Tube Q6H    Assessment: Patient was on Eliquis 5mg  BID following a DVT. Eliquis was held due to concern for GI bleeding. GI has okay'd patient to restart Eliquis and signed off.  Eliquis resumed 07/06/22 for history of DVT. CBC stable. No bleeding noted MD noted 7/21 patient is tolerating Eliquis     Plan:  Continue Eliquis 5 mg BID   Recommend to monitor for s/sx of bleeding, trend H&H and platelets.  Pharmacy signing off  Thank you for allowing pharmacy to be part of this  patients care team. 8/21, RPh Clinical Pharmacist 07/10/2022 2:49 PM

## 2022-07-11 LAB — GLUCOSE, CAPILLARY
Glucose-Capillary: 100 mg/dL — ABNORMAL HIGH (ref 70–99)
Glucose-Capillary: 102 mg/dL — ABNORMAL HIGH (ref 70–99)
Glucose-Capillary: 106 mg/dL — ABNORMAL HIGH (ref 70–99)
Glucose-Capillary: 109 mg/dL — ABNORMAL HIGH (ref 70–99)
Glucose-Capillary: 110 mg/dL — ABNORMAL HIGH (ref 70–99)
Glucose-Capillary: 133 mg/dL — ABNORMAL HIGH (ref 70–99)

## 2022-07-11 MED ORDER — GERHARDT'S BUTT CREAM
TOPICAL_CREAM | CUTANEOUS | Status: DC | PRN
  Filled 2022-07-11: qty 1

## 2022-07-11 NOTE — Progress Notes (Signed)
Pt refused CPT via vest therapy at this time. RT will continue to monitor.
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
## 2022-07-11 NOTE — Progress Notes (Signed)
Chest Vest held at this time. Patient is sleeping comfortably.

## 2022-07-12 LAB — GLUCOSE, CAPILLARY
Glucose-Capillary: 100 mg/dL — ABNORMAL HIGH (ref 70–99)
Glucose-Capillary: 105 mg/dL — ABNORMAL HIGH (ref 70–99)
Glucose-Capillary: 106 mg/dL — ABNORMAL HIGH (ref 70–99)
Glucose-Capillary: 112 mg/dL — ABNORMAL HIGH (ref 70–99)
Glucose-Capillary: 113 mg/dL — ABNORMAL HIGH (ref 70–99)
Glucose-Capillary: 114 mg/dL — ABNORMAL HIGH (ref 70–99)
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
## 2022-07-13 LAB — GLUCOSE, CAPILLARY
Glucose-Capillary: 104 mg/dL — ABNORMAL HIGH (ref 70–99)
Glucose-Capillary: 107 mg/dL — ABNORMAL HIGH (ref 70–99)
Glucose-Capillary: 108 mg/dL — ABNORMAL HIGH (ref 70–99)
Glucose-Capillary: 111 mg/dL — ABNORMAL HIGH (ref 70–99)
Glucose-Capillary: 112 mg/dL — ABNORMAL HIGH (ref 70–99)
Glucose-Capillary: 122 mg/dL — ABNORMAL HIGH (ref 70–99)
Glucose-Capillary: 87 mg/dL (ref 70–99)

## 2022-07-13 NOTE — Progress Notes (Signed)
Inpatient Rehab Admissions Coordinator:   Continue to follow for return to CIR.  I am hopeful that we can begin admitting patients Wednesday this week.  Will update team as soon as I'm able.   Estill Dooms, PT, DPT Admissions Coordinator [phone] 07/13/22  3:12 PM

## 2022-07-13 NOTE — Progress Notes (Signed)
SLP Cancellation Note  Patient Details Name: Carl Carpenter MRN: [id number] DOB: [birth year]   Cancelled treatment:       Reason Eval/Treat Not Completed: Patient at procedure or test/unavailable  Attempted to see pt for PO trials in preparation for MBS, unfortunately, pt currently working with PT. Will continue efforts. RN aware.  Clyde Upshaw B. Murvin Natal, [facility], CCC-SLP Speech Language Pathologist Office: [phone]  Leigh Aurora 07/13/2022, 1:10 PM
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
## 2022-07-14 ENCOUNTER — Encounter (HOSPITAL_COMMUNITY): Payer: Self-pay | Admitting: Physical Medicine & Rehabilitation

## 2022-07-14 ENCOUNTER — Inpatient Hospital Stay (HOSPITAL_COMMUNITY)
Admission: RE | Admit: 2022-07-14 | Discharge: 2022-07-28 | DRG: 945 | Disposition: A | Discharge status: Home / Self Care | Payer: Medicaid Other | Source: Intra-hospital | Attending: Physical Medicine & Rehabilitation | Admitting: Physical Medicine & Rehabilitation

## 2022-07-14 ENCOUNTER — Other Ambulatory Visit: Payer: Self-pay

## 2022-07-14 DIAGNOSIS — R5383 Other fatigue: Secondary | ICD-10-CM | POA: Diagnosis not present

## 2022-07-14 DIAGNOSIS — Z931 Gastrostomy status: Secondary | ICD-10-CM | POA: Diagnosis not present

## 2022-07-14 DIAGNOSIS — R269 Unspecified abnormalities of gait and mobility: Secondary | ICD-10-CM | POA: Diagnosis not present

## 2022-07-14 DIAGNOSIS — R569 Unspecified convulsions: Secondary | ICD-10-CM | POA: Diagnosis present

## 2022-07-14 DIAGNOSIS — J398 Other specified diseases of upper respiratory tract: Secondary | ICD-10-CM | POA: Diagnosis present

## 2022-07-14 DIAGNOSIS — S069X4S Unspecified intracranial injury with loss of consciousness of 6 hours to 24 hours, sequela: Secondary | ICD-10-CM | POA: Diagnosis not present

## 2022-07-14 DIAGNOSIS — I82401 Acute embolism and thrombosis of unspecified deep veins of right lower extremity: Secondary | ICD-10-CM | POA: Diagnosis not present

## 2022-07-14 DIAGNOSIS — I824Z1 Acute embolism and thrombosis of unspecified deep veins of right distal lower extremity: Secondary | ICD-10-CM | POA: Diagnosis present

## 2022-07-14 DIAGNOSIS — A419 Sepsis, unspecified organism: Secondary | ICD-10-CM | POA: Diagnosis not present

## 2022-07-14 DIAGNOSIS — J690 Pneumonitis due to inhalation of food and vomit: Secondary | ICD-10-CM | POA: Diagnosis not present

## 2022-07-14 DIAGNOSIS — Z7901 Long term (current) use of anticoagulants: Secondary | ICD-10-CM | POA: Diagnosis not present

## 2022-07-14 DIAGNOSIS — Z880 Allergy status to penicillin: Secondary | ICD-10-CM | POA: Diagnosis not present

## 2022-07-14 DIAGNOSIS — R339 Retention of urine, unspecified: Secondary | ICD-10-CM | POA: Diagnosis present

## 2022-07-14 DIAGNOSIS — A0472 Enterocolitis due to Clostridium difficile, not specified as recurrent: Secondary | ICD-10-CM | POA: Diagnosis present

## 2022-07-14 DIAGNOSIS — S069XAA Unspecified intracranial injury with loss of consciousness status unknown, initial encounter: Secondary | ICD-10-CM | POA: Diagnosis present

## 2022-07-14 DIAGNOSIS — S12000D Unspecified displaced fracture of first cervical vertebra, subsequent encounter for fracture with routine healing: Secondary | ICD-10-CM

## 2022-07-14 DIAGNOSIS — R5381 Other malaise: Secondary | ICD-10-CM | POA: Diagnosis present

## 2022-07-14 DIAGNOSIS — Z79899 Other long term (current) drug therapy: Secondary | ICD-10-CM

## 2022-07-14 DIAGNOSIS — R4189 Other symptoms and signs involving cognitive functions and awareness: Secondary | ICD-10-CM | POA: Diagnosis present

## 2022-07-14 DIAGNOSIS — R4587 Impulsiveness: Secondary | ICD-10-CM | POA: Diagnosis present

## 2022-07-14 DIAGNOSIS — F17210 Nicotine dependence, cigarettes, uncomplicated: Secondary | ICD-10-CM | POA: Diagnosis present

## 2022-07-14 DIAGNOSIS — R41841 Cognitive communication deficit: Secondary | ICD-10-CM | POA: Diagnosis not present

## 2022-07-14 DIAGNOSIS — S069X9D Unspecified intracranial injury with loss of consciousness of unspecified duration, subsequent encounter: Secondary | ICD-10-CM | POA: Diagnosis not present

## 2022-07-14 DIAGNOSIS — Z8701 Personal history of pneumonia (recurrent): Secondary | ICD-10-CM

## 2022-07-14 DIAGNOSIS — R451 Restlessness and agitation: Secondary | ICD-10-CM | POA: Diagnosis present

## 2022-07-14 DIAGNOSIS — S02113D Unspecified occipital condyle fracture, subsequent encounter for fracture with routine healing: Secondary | ICD-10-CM

## 2022-07-14 DIAGNOSIS — J386 Stenosis of larynx: Secondary | ICD-10-CM

## 2022-07-14 DIAGNOSIS — S069X9S Unspecified intracranial injury with loss of consciousness of unspecified duration, sequela: Secondary | ICD-10-CM

## 2022-07-14 DIAGNOSIS — D649 Anemia, unspecified: Secondary | ICD-10-CM | POA: Diagnosis present

## 2022-07-14 DIAGNOSIS — R1312 Dysphagia, oropharyngeal phase: Secondary | ICD-10-CM | POA: Diagnosis present

## 2022-07-14 DIAGNOSIS — I10 Essential (primary) hypertension: Secondary | ICD-10-CM | POA: Diagnosis present

## 2022-07-14 DIAGNOSIS — H9192 Unspecified hearing loss, left ear: Secondary | ICD-10-CM | POA: Diagnosis not present

## 2022-07-14 DIAGNOSIS — R471 Dysarthria and anarthria: Secondary | ICD-10-CM | POA: Diagnosis present

## 2022-07-14 DIAGNOSIS — F02C3 Dementia in other diseases classified elsewhere, severe, with mood disturbance: Secondary | ICD-10-CM

## 2022-07-14 DIAGNOSIS — S12090D Other displaced fracture of first cervical vertebra, subsequent encounter for fracture with routine healing: Secondary | ICD-10-CM | POA: Diagnosis not present

## 2022-07-14 DIAGNOSIS — S069XAS Unspecified intracranial injury with loss of consciousness status unknown, sequela: Secondary | ICD-10-CM | POA: Diagnosis not present

## 2022-07-14 DIAGNOSIS — J9601 Acute respiratory failure with hypoxia: Secondary | ICD-10-CM | POA: Diagnosis not present

## 2022-07-14 DIAGNOSIS — G40909 Epilepsy, unspecified, not intractable, without status epilepticus: Secondary | ICD-10-CM | POA: Diagnosis not present

## 2022-07-14 DIAGNOSIS — G9340 Encephalopathy, unspecified: Secondary | ICD-10-CM | POA: Diagnosis not present

## 2022-07-14 DIAGNOSIS — G479 Sleep disorder, unspecified: Secondary | ICD-10-CM | POA: Diagnosis not present

## 2022-07-14 DIAGNOSIS — Z87820 Personal history of traumatic brain injury: Secondary | ICD-10-CM | POA: Diagnosis not present

## 2022-07-14 DIAGNOSIS — R32 Unspecified urinary incontinence: Secondary | ICD-10-CM | POA: Diagnosis present

## 2022-07-14 LAB — GLUCOSE, CAPILLARY
Glucose-Capillary: 103 mg/dL — ABNORMAL HIGH (ref 70–99)
Glucose-Capillary: 112 mg/dL — ABNORMAL HIGH (ref 70–99)
Glucose-Capillary: 103 mg/dL — ABNORMAL HIGH (ref 70–99)
Glucose-Capillary: 107 mg/dL — ABNORMAL HIGH (ref 70–99)

## 2022-07-14 MED ORDER — VANCOMYCIN 50 MG/ML ORAL SOLUTION: 125.0000 mg | Freq: Four times a day (QID) | ORAL | Status: DC

## 2022-07-14 MED ORDER — PROCHLORPERAZINE MALEATE 5 MG PO TABS: 5.0000 mg | ORAL_TABLET | Freq: Four times a day (QID) | ORAL | Status: DC | PRN

## 2022-07-14 MED ORDER — QUETIAPINE FUMARATE 25 MG PO TABS
25.0000 mg | ORAL_TABLET | Freq: Every day | ORAL | Status: DC
  Administered 2022-07-14 – 2022-07-20 (×7): 25 mg
  Filled 2022-07-14 (×7): qty 1

## 2022-07-14 MED ORDER — LEVETIRACETAM 500 MG PO TABS: 500.0000 mg | ORAL_TABLET | Freq: Two times a day (BID) | ORAL | Status: DC

## 2022-07-14 MED ORDER — PROCHLORPERAZINE 25 MG RE SUPP: 12.5000 mg | Freq: Four times a day (QID) | RECTAL | Status: DC | PRN

## 2022-07-14 MED ORDER — PROSOURCE TF PO LIQD: 45.0000 mL | Freq: Four times a day (QID) | ORAL | Status: DC

## 2022-07-14 MED ORDER — IPRATROPIUM-ALBUTEROL 0.5-2.5 (3) MG/3ML IN SOLN: 3.0000 mL | Freq: Four times a day (QID) | RESPIRATORY_TRACT | Status: DC | PRN

## 2022-07-14 MED ORDER — JEVITY 1.5 CAL/FIBER PO LIQD
1000.0000 mL | ORAL | Status: DC
Start: 2022-07-14 — End: 2022-07-15
  Administered 2022-07-14 – 2022-07-15 (×2): 1000 mL
  Filled 2022-07-14 (×2): qty 1000

## 2022-07-14 MED ORDER — MAGNESIUM HYDROXIDE 400 MG/5ML PO SUSP: 30.0000 mL | Freq: Every day | ORAL | Status: DC | PRN

## 2022-07-14 MED ORDER — VANCOMYCIN 50 MG/ML ORAL SOLUTION
125.0000 mg | Freq: Four times a day (QID) | ORAL | Status: AC
  Administered 2022-07-14 – 2022-07-22 (×30): 125 mg
  Filled 2022-07-14 (×35): qty 2.5

## 2022-07-14 MED ORDER — APIXABAN 5 MG PO TABS
5.0000 mg | ORAL_TABLET | Freq: Two times a day (BID) | ORAL | Status: DC
  Administered 2022-07-14 – 2022-07-28 (×28): 5 mg
  Filled 2022-07-14 (×28): qty 1

## 2022-07-14 MED ORDER — JEVITY 1.5 CAL/FIBER PO LIQD: 1000.0000 mL | ORAL | Status: DC

## 2022-07-14 MED ORDER — SACCHAROMYCES BOULARDII 250 MG PO CAPS: 250.0000 mg | ORAL_CAPSULE | Freq: Two times a day (BID) | ORAL | Status: DC

## 2022-07-14 MED ORDER — LEVALBUTEROL HCL 0.63 MG/3ML IN NEBU
0.6300 mg | INHALATION_SOLUTION | Freq: Two times a day (BID) | RESPIRATORY_TRACT | Status: DC
  Administered 2022-07-14: 0.63 mg via RESPIRATORY_TRACT
  Filled 2022-07-14 (×2): qty 3

## 2022-07-14 MED ORDER — IPRATROPIUM BROMIDE 0.02 % IN SOLN
0.5000 mg | Freq: Two times a day (BID) | RESPIRATORY_TRACT | Status: DC
  Administered 2022-07-14: 0.5 mg via RESPIRATORY_TRACT
  Filled 2022-07-14 (×2): qty 2.5

## 2022-07-14 MED ORDER — CHLORHEXIDINE GLUCONATE CLOTH 2 % EX PADS: 6.0000 | MEDICATED_PAD | Freq: Every day | CUTANEOUS | Status: DC

## 2022-07-14 MED ORDER — ACETAMINOPHEN 160 MG/5ML PO SOLN
650.0000 mg | Freq: Four times a day (QID) | ORAL | Status: DC | PRN
  Administered 2022-07-18: 650 mg
  Filled 2022-07-14: qty 20.3

## 2022-07-14 MED ORDER — SACCHAROMYCES BOULARDII 250 MG PO CAPS
250.0000 mg | ORAL_CAPSULE | Freq: Two times a day (BID) | ORAL | Status: DC
  Administered 2022-07-14 – 2022-07-28 (×28): 250 mg
  Filled 2022-07-14 (×28): qty 1

## 2022-07-14 MED ORDER — METHOCARBAMOL 500 MG PO TABS: 500.0000 mg | ORAL_TABLET | Freq: Four times a day (QID) | ORAL | Status: DC | PRN

## 2022-07-14 MED ORDER — PROPRANOLOL HCL 20 MG/5ML PO SOLN
50.0000 mg | Freq: Four times a day (QID) | ORAL | Status: DC
Start: 2022-07-14 — End: 2022-07-28
  Administered 2022-07-14 – 2022-07-28 (×55): 50 mg
  Filled 2022-07-14 (×59): qty 12.5

## 2022-07-14 MED ORDER — IPRATROPIUM BROMIDE 0.02 % IN SOLN: 0.5000 mg | Freq: Two times a day (BID) | RESPIRATORY_TRACT | 12 refills | Status: DC

## 2022-07-14 MED ORDER — ORAL CARE MOUTH RINSE: 15.0000 mL | OROMUCOSAL | Status: DC | PRN

## 2022-07-14 MED ORDER — PROPRANOLOL HCL 20 MG/5ML PO SOLN: 50.0000 mg | Freq: Four times a day (QID) | ORAL | 12 refills | Status: DC

## 2022-07-14 MED ORDER — FLEET ENEMA 7-19 GM/118ML RE ENEM
1.0000 | ENEMA | Freq: Once | RECTAL | Status: DC | PRN
  Filled 2022-07-14: qty 1

## 2022-07-14 MED ORDER — LEVETIRACETAM 500 MG PO TABS
500.0000 mg | ORAL_TABLET | Freq: Two times a day (BID) | ORAL | Status: DC
  Administered 2022-07-14 – 2022-07-28 (×28): 500 mg
  Filled 2022-07-14 (×28): qty 1

## 2022-07-14 MED ORDER — APIXABAN 5 MG PO TABS: 5.0000 mg | ORAL_TABLET | Freq: Two times a day (BID) | ORAL | Status: DC

## 2022-07-14 MED ORDER — GERHARDT'S BUTT CREAM
TOPICAL_CREAM | CUTANEOUS | Status: DC | PRN
  Filled 2022-07-14 (×3): qty 1

## 2022-07-14 MED ORDER — FREE WATER
200.0000 mL | Freq: Three times a day (TID) | Status: DC
Start: 2022-07-14 — End: 2022-07-28
  Administered 2022-07-14 – 2022-07-28 (×40): 200 mL

## 2022-07-14 MED ORDER — METHYLPHENIDATE HCL 5 MG PO TABS
5.0000 mg | ORAL_TABLET | Freq: Two times a day (BID) | ORAL | Status: DC
Start: 2022-07-15 — End: 2022-07-15
  Administered 2022-07-15: 5 mg
  Filled 2022-07-14: qty 1

## 2022-07-14 MED ORDER — GUAIFENESIN-DM 100-10 MG/5ML PO SYRP
15.0000 mL | ORAL_SOLUTION | Freq: Four times a day (QID) | ORAL | Status: DC
  Administered 2022-07-14 – 2022-07-23 (×35): 15 mL
  Filled 2022-07-14 (×35): qty 20

## 2022-07-14 MED ORDER — LIDOCAINE HCL URETHRAL/MUCOSAL 2 % EX GEL: 1.0000 | CUTANEOUS | Status: DC | PRN

## 2022-07-14 MED ORDER — METHYLPHENIDATE HCL 5 MG PO TABS: 5.0000 mg | ORAL_TABLET | Freq: Two times a day (BID) | ORAL | 0 refills | Status: DC

## 2022-07-14 MED ORDER — PROSOURCE TF PO LIQD
45.0000 mL | Freq: Four times a day (QID) | ORAL | Status: DC
  Administered 2022-07-14 – 2022-07-28 (×54): 45 mL
  Filled 2022-07-14 (×60): qty 45

## 2022-07-14 MED ORDER — ORAL CARE MOUTH RINSE
15.0000 mL | OROMUCOSAL | Status: DC
  Administered 2022-07-14 – 2022-07-28 (×53): 15 mL via OROMUCOSAL

## 2022-07-14 MED ORDER — QUETIAPINE FUMARATE 25 MG PO TABS: 25.0000 mg | ORAL_TABLET | Freq: Every day | ORAL | Status: DC

## 2022-07-14 MED ORDER — PROCHLORPERAZINE EDISYLATE 10 MG/2ML IJ SOLN: 5.0000 mg | Freq: Four times a day (QID) | INTRAMUSCULAR | Status: DC | PRN

## 2022-07-14 MED ORDER — BISACODYL 10 MG RE SUPP: 10.0000 mg | Freq: Every day | RECTAL | Status: DC | PRN

## 2022-07-14 NOTE — Discharge Summary (Signed)
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 "FERRITIN", "TIBC", "IRON", "RETICCTPCT" in the last 72 hours. Urinalysis    Component Value Date/Time   COLORURINE YELLOW 06/14/2022 1436   APPEARANCEUR CLOUDY (A) 06/14/2022 1436   LABSPEC 1.017 06/14/2022 1436   PHURINE 7.0 06/14/2022 1436   GLUCOSEU NEGATIVE 06/14/2022 1436   HGBUR NEGATIVE 06/14/2022 1436   BILIRUBINUR NEGATIVE 06/14/2022 1436   KETONESUR NEGATIVE 06/14/2022 1436   PROTEINUR NEGATIVE 06/14/2022 1436   NITRITE NEGATIVE 06/14/2022 1436   LEUKOCYTESUR NEGATIVE 06/14/2022 1436   Sepsis Labs Recent Labs  Lab 07/08/22 0233  WBC 10.7*   Microbiology No results found for this or any previous visit (from the past 240 hour(s)).   Time coordinating discharge: 32 minutes  SIGNED:   Dorcas Carrow, MD  [facility] 07/14/2022, 11:46 AM

## 2022-07-14 NOTE — Progress Notes (Signed)
Inpatient Rehab Admissions Coordinator:    I have insurance approval and a bed available for pt to admit to CIR today. Dr. Jerral Ralph in agreement.  Will let pt/family and TOC team know.   Estill Dooms, PT, DPT Admissions Coordinator [phone] 07/14/22  10:34 AM
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
## 2022-07-14 NOTE — Progress Notes (Signed)
Inpatient Rehabilitation Admission Medication Review by a Pharmacist  A complete drug regimen review was completed for this patient to identify any potential clinically significant medication issues.  High Risk Drug Classes Is patient taking? Indication by Medication  Antipsychotic Yes Compazine- N/V Seroquel- help regulate sleep / wake cycle s/p TBI  Anticoagulant Yes Apixaban- Hx DVT  Antibiotic Yes Vanco PO- C. diff  Opioid No   Antiplatelet No   Hypoglycemics/insulin No   Vasoactive Medication Yes Inderal- HTN  Chemotherapy No   Other Yes Robaxin- muscle spasms Keppra- seizure ppx Ritalin- help regulate sleep / wake cycle s/p TBI     Type of Medication Issue Identified Description of Issue Recommendation(s)  Drug Interaction(s) (clinically significant)     Duplicate Therapy     Allergy     No Medication Administration End Date     Incorrect Dose     Additional Drug Therapy Needed     Significant med changes from prior encounter (inform family/care partners about these prior to discharge).    Other  No PTA meds on record     Clinically significant medication issues were identified that warrant physician communication and completion of prescribed/recommended actions by midnight of the next day:  No   Time spent performing this drug regimen review (minutes):  30   Gena Laski BS, PharmD, BCPS Clinical Pharmacist 07/15/2022 7:14 AM  Contact: [phone] after 3 PM  "Be curious, not judgmental..." -Debbora Dus

## 2022-07-14 NOTE — TOC Transition Note (Signed)
Transition of Care (TOC) - CM/SW Discharge Note Donn Pierini RN, BSN Transitions of Care Unit 4E- RN Case Manager See Treatment Team for direct phone #    Patient Details  Name: Carl Carpenter MRN: [id number] Date of Birth: Aug 31, 1986  [facility]) CM/SW Contact:  Darrold Span, RN Phone Number: 07/14/2022, 1:51 PM   Clinical Narrative:    Pt to return to [facility], CM has been notified that they can admit today with auth and bed available.   Pt to return CIR to progress on goals to return home. CIR to f/u on DME needs and any further transition needs prior to return home.    Final next level of care: IP Rehab Facility Barriers to Discharge: Barriers Resolved   Patient Goals and CMS Choice Patient states their goals for this hospitalization and ongoing recovery are:: return home w/ family CMS Medicare.gov Compare Post Acute Care list provided to:: Patient (significant other, parents) Choice offered to / list presented to : Parent, Spouse  Discharge Placement               [facility]        Discharge Plan and Services   Discharge Planning Services: CM Consult Post Acute Care Choice: Home Health                               Social Determinants of Health (SDOH) Interventions     Readmission Risk Interventions     No data to display

## 2022-07-14 NOTE — H&P (Signed)
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             -continue ritalin for attention. Keep at 5mg  bid for now 6. Skin/Wound Care: Routine skin care checks             -continue Gerhardts to perineum/sacral area 7. Fluids/Electrolytes/Nutrition: Routine Is and Os and follow-up chemistries             -Tube feeding via PEG: -Jevity 1.5 at 80 ml/hr (1920 ml per day); tolerating currently -Prosource TF 45 ml QID -FW flushes 200 mL q 8 hours (2059 mL daily) -blood glucose normal>>discontinue CBGs 8: Dysphagia,Aspiration pneumonia; LLL>>completed antibiotics             -remains NPO, meds per PEG             -aspiration precautions             -continue Robitussin q 6 hours             -continue Atrovent nebs BID             -continue Xopenex nebs BID             -continue Duoneb BID PRN             -will discuss prognosis for swallowing/diet with  SLP 9: Clostridium difficile diarrhea/colitis:  continue oral vanc to 8/1; continue probiotics 10: Acute urinary retention: continue bladder scans/PVR/ in and out cath prn 11: Seizure-like activity: stable, continue Keppra 12: Subglottic tracheal narrowing: no airway compromise 13: Anemia: multifactorial; stable H and H; no indication for transfusion 14: C1 right transverse process fx, left occipital condyle fx>>out of collar 15: Acute ventilator respiratory failure requiring trach PEG 3/29             -decannulated, stoma closed     Milinda Antis, PA-C 07/08/2022   I have personally performed a face to face diagnostic evaluation of this patient and formulated the key components of the plan.  Additionally, I have personally reviewed laboratory data, imaging studies, as well as relevant notes and concur with the physician assistant's documentation above.  The patient's status has not changed from the original H&P.  Any changes in documentation from the acute care chart have been noted above.  Ranelle Oyster, MD, [place]

## 2022-07-14 NOTE — Plan of Care (Signed)
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
## 2022-07-15 DIAGNOSIS — R339 Retention of urine, unspecified: Secondary | ICD-10-CM

## 2022-07-15 DIAGNOSIS — S069X4S Unspecified intracranial injury with loss of consciousness of 6 hours to 24 hours, sequela: Secondary | ICD-10-CM

## 2022-07-15 DIAGNOSIS — R451 Restlessness and agitation: Secondary | ICD-10-CM

## 2022-07-15 LAB — COMPREHENSIVE METABOLIC PANEL
ALT: 30 U/L (ref 0–44)
AST: 19 U/L (ref 15–41)
Albumin: 3.8 g/dL (ref 3.5–5.0)
Alkaline Phosphatase: 70 U/L (ref 38–126)
Anion gap: 10 (ref 5–15)
BUN: 17 mg/dL (ref 6–20)
CO2: 27 mmol/L (ref 22–32)
Calcium: 9.8 mg/dL (ref 8.9–10.3)
Chloride: 103 mmol/L (ref 98–111)
Creatinine, Ser: 0.72 mg/dL (ref 0.61–1.24)
GFR, Estimated: >60 mL/min (ref >60)
Glucose, Bld: 118 mg/dL — ABNORMAL HIGH (ref 70–99)
Potassium: 3.7 mmol/L (ref 3.5–5.1)
Sodium: 140 mmol/L (ref 135–145)
Total Bilirubin: 0.5 mg/dL (ref 0.3–1.2)
Total Protein: 7.4 g/dL (ref 6.5–8.1)

## 2022-07-15 LAB — CBC WITH DIFFERENTIAL/PLATELET
Abs Immature Granulocytes: 0.03 10*3/uL (ref 0.00–0.07)
Basophils Absolute: 0.1 10*3/uL (ref 0.0–0.1)
Basophils Relative: 1 %
Eosinophils Absolute: 0.2 10*3/uL (ref 0.0–0.5)
Eosinophils Relative: 3 %
HCT: 34.9 % — ABNORMAL LOW (ref 39.0–52.0)
Hemoglobin: 12 g/dL — ABNORMAL LOW (ref 13.0–17.0)
Immature Granulocytes: 1 %
Lymphocytes Relative: 37 %
Lymphs Abs: 2.3 10*3/uL (ref 0.7–4.0)
MCH: 31.3 pg (ref 26.0–34.0)
MCHC: 34.4 g/dL (ref 30.0–36.0)
MCV: 90.9 fL (ref 80.0–100.0)
Monocytes Absolute: 0.6 10*3/uL (ref 0.1–1.0)
Monocytes Relative: 9 %
Neutro Abs: 3.2 10*3/uL (ref 1.7–7.7)
Neutrophils Relative %: 49 %
Platelets: 246 10*3/uL (ref 150–400)
RBC: 3.84 MIL/uL — ABNORMAL LOW (ref 4.22–5.81)
RDW: 12.6 % (ref 11.5–15.5)
WBC: 6.4 10*3/uL (ref 4.0–10.5)
nRBC: 0 % (ref 0.0–0.2)

## 2022-07-15 LAB — GLUCOSE, CAPILLARY
Glucose-Capillary: 110 mg/dL — ABNORMAL HIGH (ref 70–99)
Glucose-Capillary: 110 mg/dL — ABNORMAL HIGH (ref 70–99)
Glucose-Capillary: 117 mg/dL — ABNORMAL HIGH (ref 70–99)

## 2022-07-15 MED ORDER — METHYLPHENIDATE HCL 5 MG PO TABS
10.0000 mg | ORAL_TABLET | Freq: Two times a day (BID) | ORAL | Status: DC
  Administered 2022-07-15 – 2022-07-21 (×12): 10 mg
  Filled 2022-07-15 (×12): qty 2

## 2022-07-15 MED ORDER — JEVITY 1.5 CAL/FIBER PO LIQD
474.0000 mL | Freq: Four times a day (QID) | ORAL | Status: DC
  Administered 2022-07-15 – 2022-07-28 (×51): 474 mL
  Filled 2022-07-15: qty 474
  Filled 2022-07-15: qty 1000
  Filled 2022-07-15 (×4): qty 474
  Filled 2022-07-15: qty 1000
  Filled 2022-07-15: qty 474
  Filled 2022-07-15: qty 1000
  Filled 2022-07-15 (×2): qty 474
  Filled 2022-07-15 (×2): qty 1000
  Filled 2022-07-15 (×9): qty 474
  Filled 2022-07-15 (×2): qty 1000
  Filled 2022-07-15 (×4): qty 474
  Filled 2022-07-15: qty 1000
  Filled 2022-07-15 (×4): qty 474
  Filled 2022-07-15 (×2): qty 1000
  Filled 2022-07-15 (×3): qty 474
  Filled 2022-07-15: qty 1000
  Filled 2022-07-15 (×6): qty 474
  Filled 2022-07-15 (×3): qty 1000
  Filled 2022-07-15 (×4): qty 474
  Filled 2022-07-15: qty 1000
  Filled 2022-07-15 (×2): qty 474
  Filled 2022-07-15: qty 1000
  Filled 2022-07-15 (×2): qty 474
  Filled 2022-07-15: qty 1000
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
## 2022-07-15 NOTE — Progress Notes (Signed)
Physical Therapy Session Note  Patient Details  Name: Carl Carpenter MRN: [id number] Date of Birth: 06/22/1986  Today's Date: 07/15/2022 PT Individual Time: 1445-1455 PT Individual Time Calculation (min): 65 min   Short Term Goals: Week 1:  PT Short Term Goal 1 (Week 1): STG=LTG due to ELOS.  Skilled Therapeutic Interventions/Progress Updates:  Initiated session to make up missed time from evaluation this morning and to return patient to the bed due to skill level needed for transfers. Patient asleep in the TIS w/c with his father in the room upon PT arrival. Patient slow to arouse and lethargic throughout session. Focused session on transfer training with patient's father using the Stedy per d/c plan.  Therapeutic Activity: Bed Mobility: Patient performed sit to supine with mod-max A. Provided verbal cues for use of R arm for trunk control and lifting legs one at a time onto the bed. Performed scooting up in bed with max A x2 with patient pushing through his leg in hood-lying to assist following max multimodal cues. Transfers: Patient performed a dependent transfer using the [facility] with max A +2 with patient's father assisting on the patient's L side and PT on R due to R lean and hemiplegia, patient maintained forward flexed posture throughout transfer. Provided max multimodal cues for hip/trunk extension and initiation.  Reviewed technique and safety with use of Stedy with patient's father during transfer with >50% recall from previous family education.  Patient in bed with his father in the room and nursing staff arriving to address incontinence at end of session.  Therapy Documentation Precautions:  Precautions Precautions: Fall Precaution Comments: PEG Restrictions Weight Bearing Restrictions: No    Therapy/Group: Individual Therapy  Samyuktha Brau L Laurent Cargile PT, DPT, NCS, CBIS  07/15/2022, 4:52 PM

## 2022-07-15 NOTE — Progress Notes (Signed)
**  Late Entry**  INPATIENT REHABILITATION ADMISSION NOTE:   Arrival Method: Bed     Mental Orientation:Self    Assessment: See flowsheet   Skin: tattoos   IV'S: n/a   Pain: none reported   Tubes and Drains: n/a   Safety Measures: in place   Vital Signs: see flowsheet   Height and Weight: see flowsheet   Rehab Orientation: completed with father   Family: Father at bedside    Notes: Patient ID: Carl Carpenter, male   DOB: 03/02/1986, 36 y.o.   MRN: [id number]  Re-admit to CIR. Family has previous notebook.
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
## 2022-07-15 NOTE — Progress Notes (Signed)
Inpatient Rehabilitation  Patient information reviewed and entered into eRehab system by Merton Wadlow Elwyn Klosinski, OTR/L.   Information including medical coding, functional ability and quality indicators will be reviewed and updated through discharge.    
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
## 2022-07-16 DIAGNOSIS — A419 Sepsis, unspecified organism: Secondary | ICD-10-CM

## 2022-07-16 DIAGNOSIS — D649 Anemia, unspecified: Secondary | ICD-10-CM

## 2022-07-16 DIAGNOSIS — J690 Pneumonitis due to inhalation of food and vomit: Secondary | ICD-10-CM

## 2022-07-16 DIAGNOSIS — R339 Retention of urine, unspecified: Secondary | ICD-10-CM

## 2022-07-16 DIAGNOSIS — R451 Restlessness and agitation: Secondary | ICD-10-CM

## 2022-07-16 DIAGNOSIS — G40909 Epilepsy, unspecified, not intractable, without status epilepticus: Secondary | ICD-10-CM

## 2022-07-16 DIAGNOSIS — S069X4S Unspecified intracranial injury with loss of consciousness of 6 hours to 24 hours, sequela: Secondary | ICD-10-CM

## 2022-07-16 DIAGNOSIS — I82401 Acute embolism and thrombosis of unspecified deep veins of right lower extremity: Secondary | ICD-10-CM

## 2022-07-16 NOTE — Progress Notes (Signed)
Physical Therapy Session Note  Patient Details  Name: Carl Carpenter MRN: [id number] Date of Birth: [birth year]  Today's Date: 07/16/2022 PT Individual Time: 2500-3704 PT Individual Time Calculation (min): 76 min   Short Term Goals: Week 1:  PT Short Term Goal 1 (Week 1): STG=LTG due to ELOS.  Skilled Therapeutic Interventions/Progress Updates:     Pt received supine in bed and agrees to therapy. No complaint of pain. Supine to L sidelying with modA and maxA for sidelying to sit. Cues for sequencing and initiation. Pt performs stand pivot transfer to [facility] with maxA +1 and modA +2, with cues for hand placement, initiation, sequencing, and body mechanics. Stand pivot to mat with same assist and cues. Pt noted to have been incontinent of bowel so maxA +1 for stand pivot from mat>WC>bed. Sit to supine with maxA +2 and cues for body mechanics. Pt performs bilateral rolling with modA to assist with pericare and brief and clothes change. Pt returns to supine with maxA and maxA +2 for stand pivot form bed>WC>mat. Seated at edge of mat with exercise ball behind pt, pt cued to attend to basketball activity, reaching for ball and then placing in goal. Pt requires mod to max verbal cues to attend to task, but is able to complete 50% of time. Pt performs sit to stand with maxA +1, then adjusts to having PT and tech 3 musketeer style, ambulating x20' with maxA +2 and min/modA +3 to assist with progression of L lower extremity. Pt takes seated rest break, then ambulates additional 25' with same assist levels and cues to maintain upright posture attention, along with max facilitation of lateral weight shifting. WC transport back to room. Stand pivot back to bed with maxA +1 and maxA +2 for return to supine. Left with alarm intact and all needs within reach.  Therapy Documentation Precautions:  Precautions Precautions: Fall Precaution Comments: PEG Restrictions Weight Bearing Restrictions: No    Therapy/Group:  Individual Therapy  [name], PT, DPT 07/16/2022, 4:39 PM
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
## 2022-07-16 NOTE — Progress Notes (Signed)
Occupational Therapy TBI Note  Patient Details  Name: Carl Carpenter MRN: [id number] Date of Birth: [birth year]  Today's Date: 07/16/2022 OT Individual Time: 0822-0930 OT Individual Time Calculation (min): 68 min    Short Term Goals: Week 1:  OT Short Term Goal 1 (Week 1): Pt's caregiver will demonstrate safety in performing stedy transfers OT Short Term Goal 2 (Week 1): Pt will maintain static sitting balance with no more than mod A OT Short Term Goal 3 (Week 1): Pt will complete sit > stand in the stedy with mod +2  Skilled Therapeutic Interventions/Progress Updates:  Pt greeted semi-reclined in bed with significant other present. Pt alert and following commands very well this morning. OT asked pt if he was ready for therapy and pt stated " I was born ready!" Pt's speech is often mumbled and difficult to understand, but he responded to most questions with increased time to process. Worked on core activation and initiation long sitting in bed. Pt followed commands to grasp wash cloth at midline with L UE, then wash L leg. He then initiated washing R leg without cues, although less thorough. Pt needed max +2 to get into long sitting and Mod/max A to maintain position. Pt brought into long sitting x3 with rest breaks in between. Backwards chaining to start socks, but then pt able to activate core once in long sitting to reach forward and assist with pulling them up. MD entered room and he would not answer questions and seemed internally distracted. Noted pt to be incontinent of BM at that time. Pt able to assist with rolling in bed with increased time to initiate. Total A +2 for peri-care and brief change. Pt transitioned to sitting EOB with max A +2. Max A for sitting balance. Brought pt into figure 4 position, then pt initiated threading pants. Verbal and tactile cues throughout sitting to get more trunk, head/neck extension. Pt stood via 3 muskateers technique and max verbal and tactile cues for  hip, trunk, head and neck extension-total A to pull up pants. Pt took seated rest break, then we stood in [place] with max A+2. Pt with difficult perching on Stedy seat. Pt lifted both LE's off of Stedy and pushed his bottom off of Stedy seat back to the bed while the paddles were still up. Stopped and removed the Stedy, then pt stood again with mod A +2 and much improved initiation and power up. Once standing, he needed OT assist to pivot R LE, then max +2 to safely sit in TIS. Pt brought to the sink in TIS and initiated reach for wash cloth with L hand and brought towards face. Hand over hand to initiate use of R UE into bathing. Pt distracted by looking at himself in the mirror. Attempted to cover mirror with towel, but it would not stay, so wc turned to the side. Used backwards chaining for UB dressing with OT assist to thread head. Pt closed eyes and fell asleep before getting shirt on. With eyes closed he would squeeze my hand but not open eyes. Vitals checked as noted below. Pt needed total A to finish threading arms into shirt. He was able to briefly open eyes to command as repositioning pt in chair.    HR 92 RR 98 BP 119/77  Pt left semi-reclined in TIS wc with alarm belt on, spouse present, needs met. SLP informed of pt status as she was about to enter room to see patient.   Therapy Documentation Precautions:  Precautions  Precautions: Fall Precaution Comments: PEG Restrictions Weight Bearing Restrictions: No General:   Vital Signs:   Pain:   Agitated Behavior Scale: TBI Observation Details Observation Environment: CIR Start of observation period - Date: 07/16/22 Start of observation period - Time: 0822 End of observation period - Date: 07/16/22 End of observation period - Time: 0930 Agitated Behavior Scale (DO NOT LEAVE BLANKS) Short attention span, easy distractibility, inability to concentrate: Present to a moderate degree Impulsive, impatient, low tolerance for pain or  frustration: Absent Uncooperative, resistant to care, demanding: Absent Violent and/or threatening violence toward people or property: Absent Explosive and/or unpredictable anger: Absent Rocking, rubbing, moaning, or other self-stimulating behavior: Absent Pulling at tubes, restraints, etc.: Absent Wandering from treatment areas: Absent Restlessness, pacing, excessive movement: Absent Repetitive behaviors, motor, and/or verbal: Absent Rapid, loud, or excessive talking: Absent Sudden changes of mood: Present to a slight degree Easily initiated or excessive crying and/or laughter: Absent Self-abusiveness, physical and/or verbal: Absent Agitated behavior scale total score: 17  ADL: ADL Eating: NPO Grooming: Moderate assistance Where Assessed-Grooming: Chair Upper Body Bathing: Moderate assistance Where Assessed-Upper Body Bathing: Chair Lower Body Bathing: Dependent Where Assessed-Lower Body Bathing: Bed level Upper Body Dressing: Maximal assistance Where Assessed-Upper Body Dressing: Chair Lower Body Dressing: Dependent Where Assessed-Lower Body Dressing: Bed level Toileting: Dependent Where Assessed-Toileting: Bed level Toilet Transfer: Dependent Toilet Transfer Method: Other (comment) (stedy) Tub/Shower Transfer: Unable to assess Vision   Perception    Praxis   Exercises:   Other Treatments:     Therapy/Group: Individual Therapy  Valma Cava 07/16/2022, 9:54 AM

## 2022-07-16 NOTE — Care Management (Signed)
[facility] Individual Statement of Services  Patient Name:  Carl Carpenter  Date:  07/16/2022  Welcome to the [facility].  Our goal is to provide you with an individualized program based on your diagnosis and situation, designed to meet your specific needs.  With this comprehensive rehabilitation program, you will be expected to participate in at least 3 hours of rehabilitation therapies Monday-Friday, with modified therapy programming on the weekends.  Your rehabilitation program will include the following services:  Physical Therapy (PT), Occupational Therapy (OT), Speech Therapy (ST), 24 hour per day rehabilitation nursing, Therapeutic Recreaction (TR), Psychology, Neuropsychology, Care Coordinator, Rehabilitation Medicine, Nutrition Services, Pharmacy Services, and Other  Weekly team conferences will be held on Tuesdays to discuss your progress.  Your Inpatient Rehabilitation Care Coordinator will talk with you frequently to get your input and to update you on team discussions.  Team conferences with you and your family in attendance may also be held.  Expected length of stay: 7-10 days    Overall anticipated outcome: Dependent  Depending on your progress and recovery, your program may change. Your Inpatient Rehabilitation Care Coordinator will coordinate services and will keep you informed of any changes. Your Inpatient Rehabilitation Care Coordinator's name and contact numbers are listed  below.  The following services may also be recommended but are not provided by the [facility]:  Driving Evaluations Home Health Rehabiltiation Services Outpatient Rehabilitation Services Vocational Rehabilitation   Arrangements will be made to provide these services after discharge if needed.  Arrangements include referral to agencies that provide these services.  Your insurance has been verified to be:  [place] [employer]  Your primary doctor is:  No PCP  Pertinent information will be shared with your doctor and your insurance company.  Inpatient Rehabilitation Care Coordinator:  Susie Cassette [phone] or [phone]  Information discussed with and copy given to patient by: Gretchen Short, 07/16/2022, 9:31 AM

## 2022-07-16 NOTE — Progress Notes (Signed)
PROGRESS NOTE   Subjective/Complaints: Pt with good night. Tolerated bolus feeds so far. In good spirits this morning when I arrived  ROS: Limited due to cognitive/behavioral    Objective:   No results found. Recent Labs    07/15/22 0514  WBC 6.4  HGB 12.0*  HCT 34.9*  PLT 246   Recent Labs    07/15/22 0514  NA 140  K 3.7  CL 103  CO2 27  GLUCOSE 118*  BUN 17  CREATININE 0.72  CALCIUM 9.8    Intake/Output Summary (Last 24 hours) at 07/16/2022 1024 Last data filed at 07/16/2022 0600 Gross per 24 hour  Intake --  Output 400 ml  Net -400 ml        Physical Exam: Vital Signs Blood pressure 109/72, pulse 78, temperature 97.9 F (36.6 C), resp. rate 18, height 6\' 5"  (1.956 m), weight 133 kg, SpO2 93 %.  Constitutional: No distress . Vital signs reviewed. HEENT: NCAT, EOMI, oral membranes moist Neck: supple Cardiovascular: RRR without murmur. No JVD    Respiratory/Chest: CTA Bilaterally without wheezes or rales. Normal effort    GI/Abdomen: BS +, non-tender, non-distended, PEG site a little red, no drainage Ext: no clubbing, cyanosis, or edema Psych: pleasant and cooperative  Skin: Clean and intact without signs of breakdown Neuro:  Pt is alert. Makes eye contact and is more engaged. Frequently speaking, answers simple questions appropriately. Still distracted and impulsive. Moves all 4's L>R but activing the right leg and arm quickly now with verbal cueing.  Senses pain in all 4's. No resting tone RUE/RLE but improved really Musculoskeletal: full rom, minimal to no LE edema    Assessment/Plan: 1. Functional deficits which require 3+ hours per day of interdisciplinary therapy in a comprehensive inpatient rehab setting. Physiatrist is providing close team supervision and 24 hour management of active medical problems listed below. Physiatrist and rehab team continue to assess barriers to discharge/monitor  patient progress toward functional and medical goals  Care Tool:  Bathing    Body parts bathed by patient: Face, Chest, Left upper leg, Left lower leg, Left arm, Abdomen, Right upper leg   Body parts bathed by helper: Front perineal area, Right lower leg, Left lower leg, Right arm, Buttocks     Bathing assist Assist Level: 2 Helpers     Upper Body Dressing/Undressing Upper body dressing   What is the patient wearing?: Pull over shirt    Upper body assist Assist Level: 2 Helpers    Lower Body Dressing/Undressing Lower body dressing      What is the patient wearing?: Pants     Lower body assist Assist for lower body dressing: 2 Helpers     Toileting Toileting    Toileting assist Assist for toileting: 2 Helpers     Transfers Chair/bed transfer  Transfers assist  Chair/bed transfer activity did not occur: Safety/medical concerns (lethargy)  Chair/bed transfer assist level: Dependent - mechanical lift     Locomotion Ambulation   Ambulation assist   Ambulation activity did not occur: Safety/medical concerns          Walk 10 feet activity   Assist  Walk 10 feet activity did  not occur: Safety/medical concerns        Walk 50 feet activity   Assist Walk 50 feet with 2 turns activity did not occur: Safety/medical concerns         Walk 150 feet activity   Assist Walk 150 feet activity did not occur: Safety/medical concerns         Walk 10 feet on uneven surface  activity   Assist Walk 10 feet on uneven surfaces activity did not occur: Safety/medical concerns         Wheelchair     Assist Is the patient using a wheelchair?: Yes Type of Wheelchair: Manual (TIS for safety)    Wheelchair assist level: Dependent - Patient 0% Max wheelchair distance: >150 ft    Wheelchair 50 feet with 2 turns activity    Assist        Assist Level: Dependent - Patient 0%   Wheelchair 150 feet activity     Assist      Assist  Level: Dependent - Patient 0%   Blood pressure 109/72, pulse 78, temperature 97.9 F (36.6 C), resp. rate 18, height 6\' 5"  (1.956 m), weight 133 kg, SpO2 93 %. Medical Problem List and Plan: 1. 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 He has made nice gains with speech and attention/awareness!             -patient may shower             -ELOS/Goals: 9-12 days, mod-to max assist goals  -Continue CIR therapies including PT, OT, and SLP   2.  Antithrombotics: -DVT/anticoagulation:  Pharmaceutical: Eliquis 5 mg BID for RLE DVT             -antiplatelet therapy: none 3. Pain Management: Tylenol as needed 4. Mood/Behavior/Sleep: LCSW to evaluate and provide supportive care             -antipsychotic agents: Seroquel 25 mg q HS             -propranolol 50mg  qid for restless, agitated behavior   7/27 behavior controlled 5. Neuropsych/cognition: This patient is not capable of making decisions on his own behalf.             -7/27 increase ritalin to 10mg  6. Skin/Wound Care: Routine skin care checks             -continue Gerhardts to perineum/sacral area 7. Fluids/Electrolytes/Nutrition: Routine Is and Os and follow-up chemistries             -Tube feeding via PEG: -Jevity 1.5 at 80 ml/hr (1920 ml per day); tolerating currently -Prosource TF 45 ml QID -FW flushes 200 mL q 8 hours (2059 mL daily)  discontinued CBGs -converting to bolus feeds--appreciate RD help.  8: Dysphagia,Aspiration pneumonia; LLL>>completed antibiotics             -remains NPO, meds per PEG             -aspiration precautions             -continue Robitussin q 6 hours             -continue Atrovent nebs BID             -continue Xopenex nebs BID             -continue Duoneb BID PRN             -  still with significant oro-motor issues on exam--repeat MBS per SLP.  9: Clostridium difficile diarrhea/colitis: continue oral vanc to 8/1; continue  probiotics 10: Acute urinary retention: resolved. Pvr's minimal  -he is incontinent  -work on timed voids soon as cognition allows 11: Seizure-like activity: stable, continue Keppra 12: Subglottic tracheal narrowing: no airway compromise--oxygenating well 13: Anemia: multifactorial; stable H and H @12 .0 14: C1 right transverse process fx, left occipital condyle fx>>out of collar 15: Acute ventilator respiratory failure requiring trach PEG 3/29             -decannulated, stoma closed      LOS: 2 days A FACE TO FACE EVALUATION WAS PERFORMED  4/29 07/16/2022, 10:24 AM

## 2022-07-16 NOTE — Progress Notes (Signed)
Patient ID: Daeshaun Specht, male   DOB: 1986/08/25, 36 y.o.   MRN: [id number]   SW met with pt partner [name] in room to provide ELOS. SW discussed challenges with obtainign HH and will continue to make efforts to secure prior to ischarge. She is unsure if the CAP/DA forms have been mailed to the home. She has received follow-up with RN with insurance about PCS evaluation and will follow-up with her again once he discharges home. SW discussed DME needs: hospital bed, DABSC, portable sux, and enteral feeds. Informed SW will begin ordering these items today.   SW ordered: TTB, DABSC, hospital bed, enteral feeds (Jevity and Prosource), and portable sux with [employer] via parachute.  Loralee Pacas, MSW, [place] Office: [phone] Cell: [phone] Fax: [phone]

## 2022-07-16 NOTE — Progress Notes (Signed)
Speech Language Pathology Daily Session Note  Patient Details  Name: Carl Carpenter MRN: [id number] Date of Birth: 1986-02-08  Today's Date: 07/16/2022 SLP Individual Time: 0935-1030 SLP Individual Time Calculation (min): 55 min  Short Term Goals: Week 1: SLP Short Term Goal 1 (Week 1): STGs=LTGs due to ELOS  Skilled Therapeutic Interventions: Skilled treatment session focused on cognitive and dysphagia goals as well as family education. Upon arrival, patient was asleep while upright in the wheelchair. SLP facilitated session by providing Max A multimodal cues for arousal with minimal success. Nursing and the patient's significant other also attempted without success. Therefore, SLP utilized opportunity to provide education regarding patient's current cognitive functioning and strategies for behavior management. SLP also provided examples of ways to incorporate functional communication at home within daily tasks. She verbalized understanding. Patient eventually roused and SLP provided oral care via the suction toothbrush. Patient consumed trials of ice chips with decreased oral awareness and oral manipulation with prolonged AP transit resulting in anterior spillage and overt coughing in 50% of trials. Recommend patient remain NPO. During trials, patient became verbally agitated and was yelling commands that were not relevant to current situation and became increasingly agitated when attempting to redirect. SLP discontinued trials and patient left in a quiet environment with significant other present. Continue with current plan of care.       Pain Pain Assessment Pain Scale: 0-10 Pain Score: 0-No pain  Therapy/Group: Individual Therapy  Carl Carpenter 07/16/2022, 12:38 PM

## 2022-07-17 ENCOUNTER — Inpatient Hospital Stay (HOSPITAL_COMMUNITY): Payer: Medicaid Other

## 2022-07-17 DIAGNOSIS — R569 Unspecified convulsions: Secondary | ICD-10-CM

## 2022-07-17 DIAGNOSIS — A0472 Enterocolitis due to Clostridium difficile, not specified as recurrent: Secondary | ICD-10-CM

## 2022-07-17 DIAGNOSIS — R5383 Other fatigue: Secondary | ICD-10-CM

## 2022-07-17 DIAGNOSIS — S069X9D Unspecified intracranial injury with loss of consciousness of unspecified duration, subsequent encounter: Secondary | ICD-10-CM

## 2022-07-17 NOTE — Progress Notes (Signed)
EEG complete - results pending 

## 2022-07-17 NOTE — Progress Notes (Addendum)
Occupational Therapy TBI Note  Patient Details  Name: Carl Carpenter MRN: [id number] Date of Birth: 01/03/1986  Today's Date: 07/17/2022 OT Individual Time: 0017-4944 OT Individual Time Calculation (min): 45 min  and Today's Date: 07/17/2022 OT Missed Time: 15Minutes Missed Time Reason: Patient fatigue   Short Term Goals: Week 1:  OT Short Term Goal 1 (Week 1): Pt's caregiver will demonstrate safety in performing stedy transfers OT Short Term Goal 2 (Week 1): Pt will maintain static sitting balance with no more than mod A OT Short Term Goal 3 (Week 1): Pt will complete sit > stand in the stedy with mod +2  Skilled Therapeutic Interventions/Progress Updates:    Session 1 Pt greeted semi-reclined in bed with nursing present as well as father [name]. Pt wide eyed and trying to grab at G-tube while nursing administering meds and food bolus. OT stepped in to help restrain L hand from attempting to pull at tubes. Pt noted to bed incontinent of large BM that leaked out of brief onto bed. Rolling in bed completed with multimodal cues to initiate rolling and max A. Total A for peri-care and brief change. Worked on LB dressing, but pt extremely lethargic now requiring total A. Pt went from being very alert, to extremely lethargic and difficult to wake. OT tried sternal rub and was able to get him to briefly open eyes, but still extremely lethargic. Vitals taken and WNL as noted below. Discussed with nursing and went over meds that were administered. Pt did receive some meds that could have calmed him down too much at this time. Pt missed 30 minutes of OT due to lethargy and left semi-reclined in bed with needs met. Will follow up this afternoon per plan of care.   Therapy Documentation Precautions:  Precautions Precautions: Fall Precaution Comments: PEG Restrictions Weight Bearing Restrictions: No General: General OT Amount of Missed Time: 15 Minutes-lethargy Pain: No pain Agitated Behavior  Scale: TBI Observation Details Observation Environment: CIR Start of observation period - Date: 07/17/22 Start of observation period - Time: 0830 End of observation period - Date: 07/17/22 End of observation period - Time: 0911 Agitated Behavior Scale (DO NOT LEAVE BLANKS) Short attention span, easy distractibility, inability to concentrate: Present to a moderate degree Impulsive, impatient, low tolerance for pain or frustration: Absent Uncooperative, resistant to care, demanding: Absent Violent and/or threatening violence toward people or property: Absent Explosive and/or unpredictable anger: Absent Rocking, rubbing, moaning, or other self-stimulating behavior: Absent Pulling at tubes, restraints, etc.: Absent Wandering from treatment areas: Absent Restlessness, pacing, excessive movement: Absent Repetitive behaviors, motor, and/or verbal: Absent Rapid, loud, or excessive talking: Absent Sudden changes of mood: Present to a slight degree Easily initiated or excessive crying and/or laughter: Absent Self-abusiveness, physical and/or verbal: Absent Agitated behavior scale total score: 17  Therapy/Group: Individual Therapy  Valma Cava 07/17/2022, 10:16 AM

## 2022-07-17 NOTE — Progress Notes (Signed)
Occupational Therapy TBI Note  Patient Details  Name: Carl Carpenter MRN: [id number] Date of Birth: [birth year]  Today's Date: 07/17/2022 OT Individual Time: 3016-0109 OT Individual Time Calculation (min): 60 min    Short Term Goals: Week 1:  OT Short Term Goal 1 (Week 1): Pt's caregiver will demonstrate safety in performing stedy transfers OT Short Term Goal 2 (Week 1): Pt will maintain static sitting balance with no more than mod A OT Short Term Goal 3 (Week 1): Pt will complete sit > stand in the stedy with mod +2  Skilled Therapeutic Interventions/Progress Updates:    Pt greeted semi-reclined in bed with eyes wide open again. Pt seemingly more awake now. Asked pt if he wanted to get up out of bed and pt very agressively yelled "NO" multiple times. Assisted with donning shoes at bed level to encourage him to get up.  Pt continued to refuse to get up. We were able to get him into long sitting in bed with max A +2. Pt stayed alert for a minute and followed commands to reach to his feet. After long sitting for short time, pt closed his eyes and would not respond. Assessed vitals, BP 121/66 in long sitting, HR 88, RR 100. Pt transitioned to sitting EOB to see if he would wake up. Pt needed total A to maintain sitting at EOB and would not wake up. Nursing entered to assess. We returned him to supine with total A +2 and he quickly woke up very agitated. Threatening nurse and grabbing at him. OT assist to relax L UE and keep from reaching to grab Nurse. Vitals all WNL. With pt more alert, we sat him back at EOB with max A +2 and got him into standing 2x with max +2 using 3 muskateers technique. Pt noted to be incontinent of urine and returned to bed with max A. Rolling max +2 for peri-care and brief change. Pt lethargic and not responding after brief change. PA entered to assess pt and discussed possible changes with med time administration, but wants to do a further workup as well. Pt left semi-reclined  I bed with family present and needs met.   Therapy Documentation Precautions:  Precautions Precautions: Fall Precaution Comments: PEG Restrictions Weight Bearing Restrictions: No Pain:  Pt pointed to his stomach when asked if he was in pain. Agitated Behavior Scale: TBI Observation Details Observation Environment: CIR Start of observation period - Date: 07/17/22 Start of observation period - Time: 1104 End of observation period - Date: 07/17/22 End of observation period - Time: 1204 Agitated Behavior Scale (DO NOT LEAVE BLANKS) Short attention span, easy distractibility, inability to concentrate: Present to a moderate degree Impulsive, impatient, low tolerance for pain or frustration: Present to a moderate degree Uncooperative, resistant to care, demanding: Present to a moderate degree Violent and/or threatening violence toward people or property: Present to a moderate degree Explosive and/or unpredictable anger: Present to a moderate degree Rocking, rubbing, moaning, or other self-stimulating behavior: Absent Pulling at tubes, restraints, etc.: Present to a slight degree Wandering from treatment areas: Absent Restlessness, pacing, excessive movement: Absent Repetitive behaviors, motor, and/or verbal: Absent Rapid, loud, or excessive talking: Absent Sudden changes of mood: Present to a moderate degree Easily initiated or excessive crying and/or laughter: Absent Self-abusiveness, physical and/or verbal: Absent Agitated behavior scale total score: 27  Therapy/Group: Individual Therapy  Valma Cava 07/17/2022, 12:20 PM

## 2022-07-17 NOTE — Progress Notes (Signed)
PROGRESS NOTE   Subjective/Complaints: Pt noted to be more lethargic this AM. He will awaken to voice or sternal rub and voice. Makes brief eye contact.  His father reports he acted in a similar way during acute care in the past and then became more alert after 2pm.  ROS: Limited due to cognitive/behavioral    Objective:   No results found. Recent Labs    07/15/22 0514  WBC 6.4  HGB 12.0*  HCT 34.9*  PLT 246    Recent Labs    07/15/22 0514  NA 140  K 3.7  CL 103  CO2 27  GLUCOSE 118*  BUN 17  CREATININE 0.72  CALCIUM 9.8     Intake/Output Summary (Last 24 hours) at 07/17/2022 1248 Last data filed at 07/17/2022 2536 Gross per 24 hour  Intake 120 ml  Output 450 ml  Net -330 ml         Physical Exam: Vital Signs Blood pressure 113/81, pulse 74, temperature 98.6 F (37 C), temperature source Oral, resp. rate 18, height 6\' 5"  (1.956 m), weight 133 kg, SpO2 97 %.  Constitutional: No distress . Vital signs reviewed. HEENT: NCAT, EOMI, oral membranes moist Neck: supple Cardiovascular: RRR without murmur. No JVD    Respiratory/Chest: CTA Bilaterally without wheezes or rales. Normal effort    GI/Abdomen: BS +, non-tender, non-distended, PEG site a little red, no drainage Ext: no clubbing, cyanosis, or edema Psych: pleasant and cooperative  Skin: Clean and intact without signs of breakdown Neuro:  Pt is more lethargic today. Opening eyes to voice or sternal rub. Several times looked at me spontaneously then appears to purposely close his eyes when I try to talk to him or wave to him.  Senses pain in all 4's. No resting tone RUE/RLE but improved really Musculoskeletal: full rom, minimal to no LE edema    Assessment/Plan: 1. Functional deficits which require 3+ hours per day of interdisciplinary therapy in a comprehensive inpatient rehab setting. Physiatrist is providing close team supervision and 24 hour  management of active medical problems listed below. Physiatrist and rehab team continue to assess barriers to discharge/monitor patient progress toward functional and medical goals  Care Tool:  Bathing    Body parts bathed by patient: Face, Chest, Left upper leg, Left lower leg, Left arm, Abdomen, Right upper leg   Body parts bathed by helper: Front perineal area, Right lower leg, Left lower leg, Right arm, Buttocks     Bathing assist Assist Level: 2 Helpers     Upper Body Dressing/Undressing Upper body dressing   What is the patient wearing?: Pull over shirt    Upper body assist Assist Level: 2 Helpers    Lower Body Dressing/Undressing Lower body dressing      What is the patient wearing?: Pants     Lower body assist Assist for lower body dressing: 2 Helpers     Toileting Toileting    Toileting assist Assist for toileting: 2 Helpers     Transfers Chair/bed transfer  Transfers assist  Chair/bed transfer activity did not occur: Safety/medical concerns (lethargy)  Chair/bed transfer assist level: Dependent - mechanical lift     Locomotion Ambulation  Ambulation assist   Ambulation activity did not occur: Safety/medical concerns          Walk 10 feet activity   Assist  Walk 10 feet activity did not occur: Safety/medical concerns        Walk 50 feet activity   Assist Walk 50 feet with 2 turns activity did not occur: Safety/medical concerns         Walk 150 feet activity   Assist Walk 150 feet activity did not occur: Safety/medical concerns         Walk 10 feet on uneven surface  activity   Assist Walk 10 feet on uneven surfaces activity did not occur: Safety/medical concerns         Wheelchair     Assist Is the patient using a wheelchair?: Yes Type of Wheelchair: Manual (TIS for safety)    Wheelchair assist level: Dependent - Patient 0% Max wheelchair distance: >150 ft    Wheelchair 50 feet with 2 turns  activity    Assist        Assist Level: Dependent - Patient 0%   Wheelchair 150 feet activity     Assist      Assist Level: Dependent - Patient 0%   Blood pressure 113/81, pulse 74, temperature 98.6 F (37 C), temperature source Oral, resp. rate 18, height 6\' 5"  (1.956 m), weight 133 kg, SpO2 97 %. Medical Problem List and Plan: 1. 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 He has made nice gains with speech and attention/awareness!             -patient may shower             -ELOS/Goals: 9-12 days, mod-to max assist goals  -Continue CIR therapies including PT, OT, and SLP   2.  Antithrombotics: -DVT/anticoagulation:  Pharmaceutical: Eliquis 5 mg BID for RLE DVT             -antiplatelet therapy: none 3. Pain Management: Tylenol as needed 4. Mood/Behavior/Sleep: LCSW to evaluate and provide supportive care             -antipsychotic agents: Seroquel 25 mg q HS             -propranolol 50mg  qid for restless, agitated behavior   -7/27 behavior controlled 5. Neuropsych/cognition: This patient is not capable of making decisions on his own behalf.             -7/27 increase ritalin to 10mg   -7/28 consider further increase of ritalin  6. Skin/Wound Care: Routine skin care checks             -continue Gerhardts to perineum/sacral area 7. Fluids/Electrolytes/Nutrition: Routine Is and Os and follow-up chemistries             -Tube feeding via PEG: -Jevity 1.5 at 80 ml/hr (1920 ml per day); tolerating currently -Prosource TF 45 ml QID -FW flushes 200 mL q 8 hours (2059 mL daily)  discontinued CBGs -converting to bolus feeds--appreciate RD help.  8: Dysphagia,Aspiration pneumonia; LLL>>completed antibiotics             -remains NPO, meds per PEG             -aspiration precautions             -continue Robitussin q 6 hours             -  continue Atrovent nebs BID             -continue Xopenex nebs  BID             -continue Duoneb BID PRN             -still with significant oro-motor issues on exam--repeat MBS per SLP.  9: Clostridium difficile diarrhea/colitis: continue oral vanc to 8/1; continue probiotics  -Appears diarrhea has improved 10: Acute urinary retention: resolved. Pvr's minimal  -he is incontinent  -work on timed voids soon as cognition allows 11: Seizure-like activity: stable, continue Keppra  -order EEG 7/28 12: Subglottic tracheal narrowing: no airway compromise--oxygenating well 13: Anemia: multifactorial; stable H and H @12 .0 14: C1 right transverse process fx, left occipital condyle fx>>out of collar 15: Acute ventilator respiratory failure requiring trach PEG 3/29             -decannulated, stoma closed      LOS: 3 days A FACE TO FACE EVALUATION WAS PERFORMED  4/29 07/17/2022, 12:48 PM

## 2022-07-17 NOTE — Progress Notes (Signed)
Speech Language Pathology Daily Session Note  Patient Details  Name: Carl Carpenter MRN: [id number] Date of Birth: [birth year]  Today's Date: 07/17/2022 SLP Individual Time: 1020-1105 SLP Individual Time Calculation (min): 45 min  Short Term Goals: Week 1: SLP Short Term Goal 1 (Week 1): STGs=LTGs due to ELOS  Skilled Therapeutic Interventions: S: Pt seen this date for skilled ST intervention targeting deglutition and family education goals outlined in care plan. Pt received sleeping in bed. When SLP called pt's name, he opened his eyes half way and then closed them again. Did not open his eyes when name was called a second time; therefore, SLP provided education to pt's father who was present.   O: SLP facilitated today's session by providing skilled family education re: ST POC, RLA levels, RLA level IV behaviors, strategies for mitigating agitation, ways to encourage participation and communication at home, advocacy for pt's medical needs, and plan for MBSS next week. Following family education, SLP provided Total A for oral care via suction toothbrush for thoroughness and time management. Pt followed commands within this context given Max A. Accepted controlled and therapeutic PO trials of 1/4 tsp sips of thin water via spoon with swallow initiation appearing seemingly delayed, though triggered on 100% of trials. Cough noted on 2 out of 5 trials. Suctioning provided at the end of trials and pt left upright in bed. During verbalizations, pt's speech was noted to be severely dysarthric and grossly unintelligible. Verbally imitated simple automatic speech with Mod A.  A: Pt appears minimally stimulable for skilled  ST interventions this session. Will plan to complete MBSS next week to assess biomechanics of swallow function and safety for PO trials for pharyngeal conditioning and pleasure.  P: Pt left in bed with father present and all safety measures activated. Continue per current ST POC.    Pain None indicated.  Therapy/Group: Individual Therapy  Lucien Budney A Yazir Koerber 07/17/2022, 4:32 PM

## 2022-07-17 NOTE — Progress Notes (Signed)
Physical Therapy Session Note  Patient Details  Name: Carl Carpenter MRN: [id number] Date of Birth: 1986-04-26  Today's Date: 07/17/2022 PT Individual Time: 2035-5974 PT Individual Time Calculation (min): 60 min   Short Term Goals: Week 1:  PT Short Term Goal 1 (Week 1): STG=LTG due to ELOS.  Skilled Therapeutic Interventions/Progress Updates:     Pt received supine in bed and agrees to therapy. No complaint of pain. Pt performs bilateral rolling to doff soiled brief and don clean brief. During process, pt is incontinent of urine and requires multiple rolls to clean. Supine to sit with maxA and cues for body mechanics and sequencing. Pt performs sit to stand form EOB with maxA and cues for initiation, anterior weight shift, and power-up. Stand pivot to WC with modA +2 and cues for positioning. WC transport to gym. MaxA for stand pivot to mat with cues for initiation. Upon sitting on mat, pt says "I pooped.". Sure enough, pt had been incontinent of bowel. MaxA for stand pivot from mat>WC>bed. MaxA for sit to supine with cues for sequencing. Pt performs bilateral rolling with minA/modA for pericare and donning clean clothes. MaxA for supine to sit, stand pviot to WC, and stand pivot back to mat. Pt works on sitting balance in short sitting at edge of mat, requiring minA/modA overall with decline in balance when pt become distracted, which is frequent. Pt performs sit to stand with modA +2, then ambulates x30' with +3 for WC follow. Pt demonstrates much improved initiation with R lower extremity, initiating step >75% of time with facilitation of lateral weight shifting. Tactile cues at trunk for upright posture and pt appears to maintain upright gaze and posture much better than previous sessions. Stand pivot back to bed with maxA. Left supine with all needs within reach.  Therapy Documentation Precautions:  Precautions Precautions: Fall Precaution Comments: PEG Restrictions Weight Bearing  Restrictions: No   Therapy/Group: Individual Therapy  [name], PT, DPT 07/17/2022, 6:17 PM

## 2022-07-17 NOTE — Procedures (Signed)
[name]  MRN: [id number]  Epilepsy Attending: Charlsie Quest  Referring Physician/Provider: Fanny Dance, MD  Date: 07/17/2022 Duration: 22.16 mins  Patient history: 36 year old male with seizure-like activity.  EEG to evaluate for seizure.  Level of alertness: Awake, asleep  AEDs during EEG study: LEV  Technical aspects: This EEG study was done with scalp electrodes positioned according to the 10-20 International system of electrode placement. Electrical activity was reviewed with band pass filter of 1-70Hz , sensitivity of 7 uV/mm, display speed of 42mm/sec with a 60Hz  notched filter applied as appropriate. EEG data were recorded continuously and digitally stored.  Video monitoring was available and reviewed as appropriate.  Description: The posterior dominant rhythm consists of 9 Hz activity of moderate voltage (25-35 uV) seen predominantly in posterior head regions, symmetric and reactive to eye opening and eye closing. Sleep was characterized by vertex waves, sleep spindles (12 to 14 Hz), maximal frontocentral region.  EEG showed intermittent generalized 3 to 6 Hz theta-delta slowing. Hyperventilation and photic stimulation were not performed.     ABNORMALITY - Intermittent slow, generalized  IMPRESSION: This study is suggestive of mild diffuse encephalopathy, nonspecific etiology. No seizures or epileptiform discharges were seen throughout the recording.  A normal interictal EEG does not exclude nor support the diagnosis of epilepsy.   Ousman Dise 

## 2022-07-18 NOTE — Progress Notes (Signed)
PROGRESS NOTE   Subjective/Complaints: Patient is sleepy Wife is at bedside and notes some increased agitation in evenings. Discussed this could be a side effect of Ritalin  ROS: Limited due to cognitive/behavioral    Objective:   EEG adult  Result Date: 07/17/2022 Charlsie Quest, MD     07/17/2022  2:38 PM [name] MRN: [id number] Epilepsy Attending: Charlsie Quest Referring Physician/Provider: Fanny Dance, MD Date: 07/17/2022 Duration: 22.16 mins Patient history: 36 year old male with seizure-like activity.  EEG to evaluate for seizure. Level of alertness: Awake, asleep AEDs during EEG study: LEV Technical aspects: This EEG study was done with scalp electrodes positioned according to the 10-20 International system of electrode placement. Electrical activity was reviewed with band pass filter of 1-70Hz , sensitivity of 7 uV/mm, display speed of 39mm/sec with a 60Hz  notched filter applied as appropriate. EEG data were recorded continuously and digitally stored.  Video monitoring was available and reviewed as appropriate. Description: The posterior dominant rhythm consists of 9 Hz activity of moderate voltage (25-35 uV) seen predominantly in posterior head regions, symmetric and reactive to eye opening and eye closing. Sleep was characterized by vertex waves, sleep spindles (12 to 14 Hz), maximal frontocentral region.  EEG showed intermittent generalized 3 to 6 Hz theta-delta slowing. Hyperventilation and photic stimulation were not performed.   ABNORMALITY - Intermittent slow, generalized IMPRESSION: This study is suggestive of mild diffuse encephalopathy, nonspecific etiology. No seizures or epileptiform discharges were seen throughout the recording. A normal interictal EEG does not exclude nor support the diagnosis of epilepsy. Priyanka   No results for input(s): "WBC", "HGB", "HCT", "PLT" in the last 72  hours. No results for input(s): "NA", "K", "CL", "CO2", "GLUCOSE", "BUN", "CREATININE", "CALCIUM" in the last 72 hours. No intake or output data in the 24 hours ending 07/18/22 1447      Physical Exam: Vital Signs Blood pressure 135/80, pulse 83, temperature 97.9 F (36.6 C), resp. rate 18, height 6\' 5"  (1.956 m), weight 133 kg, SpO2 98 %.  Gen: no distress, normal appearing HEENT: oral mucosa pink and moist, NCAT Cardio: Reg rate Chest: normal effort, normal rate of breathing Abd: soft, non-distended Ext: no edema  GI/Abdomen: BS +, non-tender, non-distended, PEG site a little red, no drainage Ext: no clubbing, cyanosis, or edema Psych: pleasant and cooperative  Skin: Clean and intact without signs of breakdown Neuro:  Pt is more lethargic today. Opening eyes to voice or sternal rub. Several times looked at me spontaneously then appears to purposely close his eyes when I try to talk to him or wave to him.  Senses pain in all 4's. No resting tone RUE/RLE but improved really Musculoskeletal: full rom, minimal to no LE edema    Assessment/Plan: 1. Functional deficits which require 3+ hours per day of interdisciplinary therapy in a comprehensive inpatient rehab setting. Physiatrist is providing close team supervision and 24 hour management of active medical problems listed below. Physiatrist and rehab team continue to assess barriers to discharge/monitor patient progress toward functional and medical goals  Care Tool:  Bathing    Body parts bathed by patient: Face, Chest, Left upper leg, Left  lower leg, Left arm, Abdomen, Right upper leg   Body parts bathed by helper: Front perineal area, Right lower leg, Left lower leg, Right arm, Buttocks     Bathing assist Assist Level: 2 Helpers     Upper Body Dressing/Undressing Upper body dressing   What is the patient wearing?: Pull over shirt    Upper body assist Assist Level: 2 Helpers    Lower Body Dressing/Undressing Lower  body dressing      What is the patient wearing?: Pants     Lower body assist Assist for lower body dressing: 2 Helpers     Toileting Toileting    Toileting assist Assist for toileting: 2 Helpers     Transfers Chair/bed transfer  Transfers assist  Chair/bed transfer activity did not occur: Safety/medical concerns (lethargy)  Chair/bed transfer assist level: Dependent - mechanical lift     Locomotion Ambulation   Ambulation assist   Ambulation activity did not occur: Safety/medical concerns          Walk 10 feet activity   Assist  Walk 10 feet activity did not occur: Safety/medical concerns        Walk 50 feet activity   Assist Walk 50 feet with 2 turns activity did not occur: Safety/medical concerns         Walk 150 feet activity   Assist Walk 150 feet activity did not occur: Safety/medical concerns         Walk 10 feet on uneven surface  activity   Assist Walk 10 feet on uneven surfaces activity did not occur: Safety/medical concerns         Wheelchair     Assist Is the patient using a wheelchair?: Yes Type of Wheelchair: Manual (TIS for safety)    Wheelchair assist level: Dependent - Patient 0% Max wheelchair distance: >150 ft    Wheelchair 50 feet with 2 turns activity    Assist        Assist Level: Dependent - Patient 0%   Wheelchair 150 feet activity     Assist      Assist Level: Dependent - Patient 0%   Blood pressure 135/80, pulse 83, temperature 97.9 F (36.6 C), resp. rate 18, height 6\' 5"  (1.956 m), weight 133 kg, SpO2 98 %. Medical Problem List and Plan: 1. 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 He has made nice gains with speech and attention/awareness!             -patient may shower             -ELOS/Goals: 9-12 days, mod-to max assist goals  -Continue CIR therapies including PT, OT, and SLP   2.   Antithrombotics: -DVT/anticoagulation:  Pharmaceutical: Eliquis 5 mg BID for RLE DVT             -antiplatelet therapy: none 3. Pain Management: Tylenol as needed 4. Mood/Behavior/Sleep: LCSW to evaluate and provide supportive care             -antipsychotic agents: Seroquel 25 mg q HS             -propranolol 50mg  qid for restless, agitated behavior   -7/27 behavior controlled 5. Neuropsych/cognition: This patient is not capable of making decisions on his own behalf.             -7/27 increase ritalin to 10mg   -7/28 consider  further increase of ritalin  6. Skin/Wound Care: Routine skin care checks             -continue Gerhardts to perineum/sacral area 7. Fluids/Electrolytes/Nutrition: Routine Is and Os and follow-up chemistries             -Tube feeding via PEG: -Jevity 1.5 at 80 ml/hr (1920 ml per day); tolerating currently -Prosource TF 45 ml QID -FW flushes 200 mL q 8 hours (2059 mL daily)  discontinued CBGs -converting to bolus feeds--appreciate RD help.  8: Dysphagia,Aspiration pneumonia; LLL>>completed antibiotics             -remains NPO, meds per PEG             -aspiration precautions             -continue Robitussin q 6 hours             -continue Atrovent nebs BID             -continue Xopenex nebs BID             -continue Duoneb BID PRN             -still with significant oro-motor issues on exam--repeat MBS per SLP.  9: Clostridium difficile diarrhea/colitis: continue oral vanc to 8/1; continue probiotics  -Appears diarrhea has improved 10: Acute urinary retention: resolved. Pvr's minimal  -he is incontinent  -work on timed voids soon as cognition allows 11: Seizure-like activity: stable, continue Keppra  -order EEG 7/28, discussed no seizures evident with wife.  12: Subglottic tracheal narrowing: no airway compromise--oxygenating well 13: Anemia: multifactorial; stable H and H @12 .0 14: C1 right transverse process fx, left occipital condyle fx>>out of collar 15:  Acute ventilator respiratory failure requiring trach PEG 3/29             -decannulated, stoma closed      LOS: 4 days A FACE TO FACE EVALUATION WAS PERFORMED  4/29 P Kushi Kun 07/18/2022, 2:47 PM

## 2022-07-18 NOTE — Progress Notes (Signed)
Speech Language Pathology Daily Session Note  Patient Details  Name: Carl Carpenter MRN: [id number] Date of Birth: April 16, 1986  Today's Date: 07/18/2022 SLP Individual Time: 1300-1330 SLP Individual Time Calculation (min): 30 min  Short Term Goals: Week 1: SLP Short Term Goal 1 (Week 1): STGs=LTGs due to ELOS  Skilled Therapeutic Interventions: Pt seen for skilled ST with focus on speech and swallow goals, wife present throughout. Pt repositioned in bed for PO trials, pt very motivated for therapeutic tasks. SLP providing total A for oral care prior to trials. Pt tolerating ~2 oz thin liquid via 1/2 tsp with 3x delayed throat/clear cough after swallow, a very small percentage of trials compared with previous tx sessions. Pt with no change in vocal quality during intake, swallow initiation mildly delayed. Pt did appear to fatigue toward end of intake resulting in more delayed A-P transit and swallow initiation. Pt consistently verbal throughout session however remains dysarthric at word and short phrase level. Pt able to follow 1-step directions throughout with extra time. Discussed POC with wife and pt with plan for MBSS next week. Pt left in bed with wife present for needs, cont ST POC.  Pain Pain Assessment Pain Scale: 0-10 Pain Score: 0-No pain Faces Pain Scale: No hurt  Therapy/Group: Individual Therapy  Tacey Ruiz 07/18/2022, 1:30 PM

## 2022-07-18 NOTE — Progress Notes (Signed)
Physical Therapy Session Note  Patient Details  Name: Carl Carpenter MRN: [id number] Date of Birth: 1986-07-29  Today's Date: 07/18/2022 PT Individual Time: 4827-0786 PT Individual Time Calculation (min): 74 min   Short Term Goals: Week 1:  PT Short Term Goal 1 (Week 1): STG=LTG due to ELOS.  Skilled Therapeutic Interventions/Progress Updates:     Pt received supine in bed and agrees to therapy. No complaint of pain. Pt noted to have been incontinent of bowel with significant amount of loose stool in bed. Pt has ebb and flow of agitation while PT performs pericare, but is able to be redirected with cues and assistance from pt's wife, [name]. Pt performs bilateral rolling with minA/modA and cues for hand placement, with cues as well for bridging and positioning to assist with pericare. Pt performs supine to sit with maxA and cues for sequencing. MaxA for sit to stand with cues for initiation and anterior weight shift. Pt performs stand step transfer to [facility] with maxA +2. WC transport to gym for time management. Pt performs stand pivot transfer to mat with maxA +1 and cues for sequencing. Pt works on sitting balance in short sitting at edge of mat. Tech positioned behind pt, seated on exercise ball to provide tactile feedback and assistance when pt has posterior drift. PT cues pt to flex trunk and press forearms into thighs to position pt in functional position with NM feedback through all four extremities. Pt is able to maintain static sitting balance up to 30 seconds at a time, but requires frequent minA to modA to bring back to midline due to R and posterior drift.  Pt ambulates x2 bouts of x25' with maxA +2 with 3 musketeers technique, and x3 for WC follow. Max facilitation of upright posture and lateral weight shifting, as well as max verbal cueing for progression of each leg during swing phase. Pt requires assistance with initiation of steps on both side ~50% of time.   Pt left supine in bed  with all needs within reach.  Therapy Documentation Precautions:  Precautions Precautions: Fall Precaution Comments: PEG Restrictions Weight Bearing Restrictions: No   Therapy/Group: Individual Therapy  [name], PT, DPT 07/18/2022, 4:46 PM

## 2022-07-19 NOTE — Progress Notes (Addendum)
Occupational Therapy Session Note  Patient Details  Name: Carl Carpenter MRN: [id number] Date of Birth: [birth year]  Today's Date: 07/19/2022 OT Individual Time: 1500-1545 OT Individual Time Calculation (min): 45 min    Short Term Goals: Week 1:  OT Short Term Goal 1 (Week 1): Pt's caregiver will demonstrate safety in performing stedy transfers OT Short Term Goal 2 (Week 1): Pt will maintain static sitting balance with no more than mod A OT Short Term Goal 3 (Week 1): Pt will complete sit > stand in the stedy with mod +2  Skilled Therapeutic Interventions/Progress Updates:    Upon OT arrival, pt semi recumbent in bed with pt's parents present in room. Pt's eyes opened but when therapist and rehab tech approach pt, pt's eyes close. Therapist and rehab tech encourage pt to participate and pt's eyes don't open. Pt's mother attempts to encourage pt and pt continues to keep his eyes closed. Pt's HOB adjusted and pt awakens suddenly moaning. Pt agreeable to OT treatment session. Pt requesting to sit up. Pt's shorts donned with Max A and completes supine to sit transfer with Mod A. Pt sits EOB for ~5 minutes with Min A-CGA. Pt completes sit to stand with 3 musketeer stance and Mod A. Pt requires verbal cues to correct body positioning and foot placement. Pt able to stand ~2 minutes before reporting dizziness and returns to sitting with Mod A. Pt's hips were scooted back onto bed with Max A and pt completes sit to supine transfer with Min A. Pt able to assist scooting up to [facility] with Min A. Pt donns lotion to his body with Mod A requiring mod verbal and tactile cues to complete. Pt's face washed with total A. Pt begins to push at rehab tech gesturing at being done with therapy and pt's eyes close. Session was ended and pt misses 15 minutes of OT treatment time. Pt was left in bed at end of session with call light in reach and parents at bedside. No pain reported during session.  Therapy  Documentation Precautions:  Precautions Precautions: Fall Precaution Comments: PEG Restrictions Weight Bearing Restrictions: No General OT Amount of Missed Time: 15 Minutes   Therapy/Group: Individual Therapy  Carolin Sicks 07/19/2022, 5:05 PM

## 2022-07-19 NOTE — Progress Notes (Signed)
PROGRESS NOTE   Subjective/Complaints: No new complaints this morning Denies pain Participated well with therapy yesterday- ambulated 50 feet with MaxA x2  ROS: Denies pain   Objective:   EEG adult  Result Date: 07/17/2022 Charlsie Quest, MD     07/17/2022  2:38 PM [name] MRN: [id number] Epilepsy Attending: Charlsie Quest Referring Physician/Provider: Fanny Dance, MD Date: 07/17/2022 Duration: 22.16 mins Patient history: 36 year old male with seizure-like activity.  EEG to evaluate for seizure. Level of alertness: Awake, asleep AEDs during EEG study: LEV Technical aspects: This EEG study was done with scalp electrodes positioned according to the 10-20 International system of electrode placement. Electrical activity was reviewed with band pass filter of 1-70Hz , sensitivity of 7 uV/mm, display speed of 36mm/sec with a 60Hz  notched filter applied as appropriate. EEG data were recorded continuously and digitally stored.  Video monitoring was available and reviewed as appropriate. Description: The posterior dominant rhythm consists of 9 Hz activity of moderate voltage (25-35 uV) seen predominantly in posterior head regions, symmetric and reactive to eye opening and eye closing. Sleep was characterized by vertex waves, sleep spindles (12 to 14 Hz), maximal frontocentral region.  EEG showed intermittent generalized 3 to 6 Hz theta-delta slowing. Hyperventilation and photic stimulation were not performed.   ABNORMALITY - Intermittent slow, generalized IMPRESSION: This study is suggestive of mild diffuse encephalopathy, nonspecific etiology. No seizures or epileptiform discharges were seen throughout the recording. A normal interictal EEG does not exclude nor support the diagnosis of epilepsy. Priyanka   No results for input(s): "WBC", "HGB", "HCT", "PLT" in the last 72 hours. No results for input(s): "NA", "K",  "CL", "CO2", "GLUCOSE", "BUN", "CREATININE", "CALCIUM" in the last 72 hours.  Intake/Output Summary (Last 24 hours) at 07/19/2022 1129 Last data filed at 07/19/2022 0504 Gross per 24 hour  Intake --  Output 550 ml  Net -550 ml        Physical Exam: Vital Signs Blood pressure 113/60, pulse 67, temperature 97.6 F (36.4 C), resp. rate 18, height 6\' 5"  (1.956 m), weight 133 kg, SpO2 95 %. Gen: no distress, normal appearing HEENT: oral mucosa pink and moist, NCAT Cardio: Reg rate Chest: normal effort, normal rate of breathing   GI/Abdomen: BS +, non-tender, non-distended, PEG site a little red, no drainage Ext: no clubbing, cyanosis, or edema Psych: pleasant and cooperative  Skin: Clean and intact without signs of breakdown Neuro:  Pt is more lethargic today. Opening eyes to voice or sternal rub. Several times looked at me spontaneously then appears to purposely close his eyes when I try to talk to him or wave to him.  Senses pain in all 4's. No resting tone RUE/RLE but improved really Musculoskeletal: full rom, minimal to no LE edema    Assessment/Plan: 1. Functional deficits which require 3+ hours per day of interdisciplinary therapy in a comprehensive inpatient rehab setting. Physiatrist is providing close team supervision and 24 hour management of active medical problems listed below. Physiatrist and rehab team continue to assess barriers to discharge/monitor patient progress toward functional and medical goals  Care Tool:  Bathing    Body parts bathed by patient:  Face, Chest, Left upper leg, Left lower leg, Left arm, Abdomen, Right upper leg   Body parts bathed by helper: Front perineal area, Right lower leg, Left lower leg, Right arm, Buttocks     Bathing assist Assist Level: 2 Helpers     Upper Body Dressing/Undressing Upper body dressing   What is the patient wearing?: Pull over shirt    Upper body assist Assist Level: 2 Helpers    Lower Body  Dressing/Undressing Lower body dressing      What is the patient wearing?: Pants     Lower body assist Assist for lower body dressing: 2 Helpers     Toileting Toileting    Toileting assist Assist for toileting: 2 Helpers     Transfers Chair/bed transfer  Transfers assist  Chair/bed transfer activity did not occur: Safety/medical concerns (lethargy)  Chair/bed transfer assist level: Dependent - mechanical lift     Locomotion Ambulation   Ambulation assist   Ambulation activity did not occur: Safety/medical concerns          Walk 10 feet activity   Assist  Walk 10 feet activity did not occur: Safety/medical concerns        Walk 50 feet activity   Assist Walk 50 feet with 2 turns activity did not occur: Safety/medical concerns         Walk 150 feet activity   Assist Walk 150 feet activity did not occur: Safety/medical concerns         Walk 10 feet on uneven surface  activity   Assist Walk 10 feet on uneven surfaces activity did not occur: Safety/medical concerns         Wheelchair     Assist Is the patient using a wheelchair?: Yes Type of Wheelchair: Manual (TIS for safety)    Wheelchair assist level: Dependent - Patient 0% Max wheelchair distance: >150 ft    Wheelchair 50 feet with 2 turns activity    Assist        Assist Level: Dependent - Patient 0%   Wheelchair 150 feet activity     Assist      Assist Level: Dependent - Patient 0%   Blood pressure 113/60, pulse 67, temperature 97.6 F (36.4 C), resp. rate 18, height 6\' 5"  (1.956 m), weight 133 kg, SpO2 95 %. Medical Problem List and Plan: 1. 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 He has made nice gains with speech and attention/awareness!             -patient may shower             -ELOS/Goals: 9-12 days, mod-to max assist goals  -Continue CIR therapies including PT, OT,  and SLP   2.  Antithrombotics: -DVT/anticoagulation:  Pharmaceutical: Eliquis 5 mg BID for RLE DVT             -antiplatelet therapy: none 3. Pain Management: Tylenol as needed 4. Mood/Behavior/Sleep: LCSW to evaluate and provide supportive care             -antipsychotic agents: Seroquel 25 mg q HS             -propranolol 50mg  qid for restless, agitated behavior   -7/27 behavior controlled 5. Neuropsych/cognition: This patient is not capable of making decisions on his own behalf.             -7/27 increase  ritalin to 10mg   -7/28 consider further increase of ritalin  6. Skin/Wound Care: Routine skin care checks             -continue Gerhardts to perineum/sacral area 7. Fluids/Electrolytes/Nutrition: Routine Is and Os and follow-up chemistries             -Tube feeding via PEG: -Jevity 1.5 at 80 ml/hr (1920 ml per day); tolerating currently -Prosource TF 45 ml QID -FW flushes 200 mL q 8 hours (2059 mL daily)  discontinued CBGs -converting to bolus feeds--appreciate RD help.  8: Dysphagia,Aspiration pneumonia; LLL>>completed antibiotics             -remains NPO, meds per PEG             -aspiration precautions             -continue Robitussin q 6 hours             -continue Atrovent nebs BID             -continue Xopenex nebs BID             -continue Duoneb BID PRN             -still with significant oro-motor issues on exam--repeat MBS per SLP.  9: Clostridium difficile diarrhea/colitis: continue oral vanc to 8/1; continue probiotics  -Appears diarrhea has improved 10: Acute urinary retention: resolved. Pvr's minimal  -he is incontinent  -work on timed voids soon as cognition allows 11: Seizure-like activity: stable, continue Keppra  -order EEG 7/28, discussed no seizures evident with wife.  12: Subglottic tracheal narrowing: no airway compromise--oxygenating well 13: Anemia: multifactorial; stable H and H @12 .0 14: C1 right transverse process fx, left occipital condyle fx>>out  of collar 15: Acute ventilator respiratory failure requiring trach PEG 3/29             -decannulated, stoma closed      LOS: 5 days A FACE TO FACE EVALUATION WAS PERFORMED  Tarick Parenteau P Tamme Mozingo 07/19/2022, 11:29 AM

## 2022-07-19 NOTE — Progress Notes (Signed)
Occupational Therapy TBI Note  Patient Details  Name: Carl Carpenter MRN: [id number] Date of Birth: June 17, 1986  Today's Date: 07/19/2022 OT Individual Time: 2536-6440 OT Individual Time Calculation (min): 45 min    Short Term Goals: Week 1:  OT Short Term Goal 1 (Week 1): Pt's caregiver will demonstrate safety in performing stedy transfers OT Short Term Goal 2 (Week 1): Pt will maintain static sitting balance with no more than mod A OT Short Term Goal 3 (Week 1): Pt will complete sit > stand in the stedy with mod +2  Skilled Therapeutic Interventions/Progress Updates:  Pt received asleep in supine with mother present, pt needing + time to arouse but  agreeable to OT intervention. Session focus on BADL reeducation, functional mobility, dynamic standing balance and decreasing overall caregiver burden.            Pt noted to have incontinent loose BM needing total A +2 for pericare while pt rolled r<>L with MODA. Donned new pants from bed level with MAX A +2, pt was able to bridge and assist with pulling pants up to waist line with RUE.  Pt completed supine>sit to L side with MAX A, used stedy for OOB transfer, pt needed MOD A+2 for sit>stand mostly for safety and trunk control, once in stedy, pt reports " I pooped." Pt unable to stand long enough in stedy for pericare therefore returned pt to supine with MAX A.  Pt completed pericare from bed again with total A +2, donned pants with same level of assist as previously indicated, pts bottom noted to be red, family requesting staff only use baby wipes for pericare, also applied buttock cream for pain mgmt.  Supine>sit again with MAX A, pt completed sit>stand in stedy with same level fo assist as previously indicated, total A transport to TIS. From w/c pt able to doff shirt with MAX A needing MOD verbal cues for technique. MAX A to don new shirt from TIS. Pt completed grooming tasks such as applying deo and brushing hair with overall MIN A. Improved  command following noted this session and attention to task. Pt making more verbalizations this session stating that he loved OTA and RT. Pt stating yes and no appropriately.          pt left seated in TIS with RN present.                      Therapy Documentation Precautions:  Precautions Precautions: Fall Precaution Comments: PEG Restrictions Weight Bearing Restrictions: No    Pain: unrated pain in buttock from rash, used baby wipes vs cloths and applied cream    Agitated Behavior Scale: TBI Observation Details Observation Environment: pt room Start of observation period - Date: 07/19/22 Start of observation period - Time: 0800 End of observation period - Date: 07/19/22 End of observation period - Time: 0848 Agitated Behavior Scale (DO NOT LEAVE BLANKS) Short attention span, easy distractibility, inability to concentrate: Present to a moderate degree Impulsive, impatient, low tolerance for pain or frustration: Present to a moderate degree Violent and/or threatening violence toward people or property: Absent Explosive and/or unpredictable anger: Present to a slight degree Rocking, rubbing, moaning, or other self-stimulating behavior: Absent Pulling at tubes, restraints, etc.: Present to a slight degree Wandering from treatment areas: Absent Restlessness, pacing, excessive movement: Absent Repetitive behaviors, motor, and/or verbal: Absent Rapid, loud, or excessive talking: Present to a slight degree Sudden changes of mood: Present to a slight degree Easily initiated  or excessive crying and/or laughter: Absent Self-abusiveness, physical and/or verbal: Absent    Therapy/Group: Individual Therapy  Pollyann Glen [facility] 07/19/2022, 12:02 PM

## 2022-07-19 NOTE — Progress Notes (Signed)
Assisted assigned NT with incontinent care, some agitation noted ,father at bedside

## 2022-07-19 NOTE — Plan of Care (Signed)
  Problem: RH BOWEL ELIMINATION Goal: RH STG MANAGE BOWEL WITH ASSISTANCE Description: STG Manage Bowel with Mod Assistance. Outcome: Not Progressing; incontinence   Problem: RH BLADDER ELIMINATION Goal: RH STG MANAGE BLADDER WITH ASSISTANCE Description: STG Manage Bladder With Mod Assistance Outcome: Not Progressing; incontinence; bladder scan q 8

## 2022-07-20 DIAGNOSIS — R1312 Dysphagia, oropharyngeal phase: Secondary | ICD-10-CM

## 2022-07-20 LAB — BASIC METABOLIC PANEL
Anion gap: 12 (ref 5–15)
BUN: 13 mg/dL (ref 6–20)
CO2: 27 mmol/L (ref 22–32)
Calcium: 10.1 mg/dL (ref 8.9–10.3)
Chloride: 100 mmol/L (ref 98–111)
Creatinine, Ser: 0.76 mg/dL (ref 0.61–1.24)
GFR, Estimated: >60 mL/min (ref >60)
Glucose, Bld: 85 mg/dL (ref 70–99)
Potassium: 3.7 mmol/L (ref 3.5–5.1)
Sodium: 139 mmol/L (ref 135–145)

## 2022-07-20 LAB — CBC
HCT: 35.3 % — ABNORMAL LOW (ref 39.0–52.0)
Hemoglobin: 12.2 g/dL — ABNORMAL LOW (ref 13.0–17.0)
MCH: 31.9 pg (ref 26.0–34.0)
MCHC: 34.6 g/dL (ref 30.0–36.0)
MCV: 92.2 fL (ref 80.0–100.0)
Platelets: 192 10*3/uL (ref 150–400)
RBC: 3.83 MIL/uL — ABNORMAL LOW (ref 4.22–5.81)
RDW: 12.4 % (ref 11.5–15.5)
WBC: 6.2 10*3/uL (ref 4.0–10.5)
nRBC: 0 % (ref 0.0–0.2)

## 2022-07-20 NOTE — Progress Notes (Signed)
Obtained residual of 270 ml from Peg Tube prior to bolus feeding, Dr Carlis Abbott notified, with order to hold Jevity bolus feeding, Fibersource and water flush of 200 ml.  Can restart bolus feeding in AM.  Dr Carlis Abbott stated that she will discuss with the nutritionist in AM regarding parameters for Peg tube residual.

## 2022-07-20 NOTE — Progress Notes (Addendum)
Occupational Therapy TBI Note  Patient Details  Name: Carl Carpenter MRN: [id number] Date of Birth: [birth year]  Today's Date: 07/20/2022 Session 1 OT Individual Time: 2992-4268 OT Individual Time Calculation (min): 70 min   Session 2 OT Individual Time: 3419-6222 OT Individual Time Calculation (min): 45 min    Short Term Goals: Week 1:  OT Short Term Goal 1 (Week 1): Pt's caregiver will demonstrate safety in performing stedy transfers OT Short Term Goal 2 (Week 1): Pt will maintain static sitting balance with no more than mod A OT Short Term Goal 3 (Week 1): Pt will complete sit > stand in the stedy with mod +2  Skilled Therapeutic Interventions/Progress Updates:  Session 1   Pt greeted semi-reclined in bed with Dad [name] present. Rehab tech entered room right bef ore OT and she reported pt was able to wake and was answering questions. Pt stated he wanted to go outside. Pt then lethargic, closing eyes, and not wanting to participate. Total A to thread pant legs and not responding to commands. Attempted to get patient to bridge to pull up pants, but he would not respond to commands. Pt  need Total A to come to sitting EOB. Once sitting he opened eyes and could assist with sitting balance. Max A +2 for sit<>stand using 3 muskateers technique, then max +2 to pivot over to TIS wc. Pt brought to the sink for hand washing. He initiated reaching forward to turn on hot water, then needed OT assist to obtain soap. Hand over hand A to integrate R UE. Pt brought outside in [place] wc. We played patient preferred country music and talked about going home. To alert for most of our time outside. Pt noted to have large loose incontinent BM. He became more uncomfortable in wc likely due to [facility] and began kicking and pushing back the chair. Pt brought back to room. Sted used for sit<>stand with max+2 and max facilitation to get hips up enough to place Stedy paddles. Pt with trunk completed flexed and eyes closed  requiring 3 person assist to get back to bed in [place]. Total A peri-care and brief change performed. Pt with eyes closed throughout rolling and peri-care, not participating at all. Attempted long sitting in bed after being cleaned up with max +2. But unable to open eyes or maintain long sitting.Total A +2 to scoot up in bed. Pt left semi-reclined in bed with dad present and needs met.  Pain: Denies pain  Session 2 Pt greeted semi-reclined in bed, awake and alert. Nursing administered tube feed and meds quickly while OT made sure he didn't grab PEG tube. Pt was alert throughout majority of session this afternoon. Pt following commands to lift LE's into pant legs, then pt brought to sitting EOB w/ mod A +2. Improved sitting balance and trunk control with max A of 1. Sit<>stand 3 muskateers technique with mod A, then OT assist to pull up pants. Pt then needed max +2 to pivot over to TIS wc.  Pt noted to be incontinent of loose BM after we transferred. Worked on sit<>stands in [place] with pt initiating and only min A needed. With facilitation, pt able to maintain standing with family encouragement while OT and OT student assisted with total A peri-care and brief change. Pt brought back to therapy gym for wc assessment. Co-treat with PT for the next 30 mins of wc eval. Pt stood with 3 muskateers technique and mod A +2, then max+2 for functional ambulation 5 feet  to new TIS wc. Pt was initiating steps more consistently today. Assessed for positioning in wc including head rest, full lap tray, leg rests, and cushion. Pt then ambulated with max +2 3 muskateers and 3rd person for wc follow. Facilitation for upright head and trunk, but initiating most steps. Pt handoff to PT for remainder of PT session.   Therapy Documentation Precautions:  Precautions Precautions: Fall Precaution Comments: PEG Restrictions Weight Bearing Restrictions: No Pain: Pain Assessment Pain Scale: 0-10 Pain Score: 0-No pain Faces Pain  Scale: No hurtDenies pain Agitated Behavior Scale: TBI Observation Details Observation Environment: CIR Start of observation period - Date: 07/20/22 Start of observation period - Time: 1110 End of observation period - Date: 07/20/22 End of observation period - Time: 1150 Agitated Behavior Scale (DO NOT LEAVE BLANKS) Short attention span, easy distractibility, inability to concentrate: Present to a moderate degree Impulsive, impatient, low tolerance for pain or frustration: Present to a slight degree Uncooperative, resistant to care, demanding: Present to a slight degree Violent and/or threatening violence toward people or property: Absent Explosive and/or unpredictable anger: Present to a slight degree Rocking, rubbing, moaning, or other self-stimulating behavior: Present to a slight degree Pulling at tubes, restraints, etc.: Absent Wandering from treatment areas: Absent Restlessness, pacing, excessive movement: Absent Repetitive behaviors, motor, and/or verbal: Absent Rapid, loud, or excessive talking: Absent Sudden changes of mood: Present to a slight degree Easily initiated or excessive crying and/or laughter: Absent Self-abusiveness, physical and/or verbal: Absent Agitated behavior scale total score: 21  Therapy/Group: Individual Therapy  Valma Cava 07/20/2022, 2:42 PM

## 2022-07-20 NOTE — Progress Notes (Signed)
PROGRESS NOTE   Subjective/Complaints: Pt appears more alert this AM. His father was at the bedside. He had some residuals in PEG prior to feeding last night.   ROS: Denies pain, CP, SOB   Objective:   No results found. Recent Labs    07/20/22 0513  WBC 6.2  HGB 12.2*  HCT 35.3*  PLT 192   Recent Labs    07/20/22 0513  NA 139  K 3.7  CL 100  CO2 27  GLUCOSE 85  BUN 13  CREATININE 0.76  CALCIUM 10.1   No intake or output data in the 24 hours ending 07/20/22 1335       Physical Exam: Vital Signs Blood pressure 117/76, pulse 69, temperature 97.8 F (36.6 C), resp. rate 19, height 6\' 5"  (1.956 m), weight 133 kg, SpO2 98 %. Gen: no distress, normal appearing HEENT: oral mucosa pink and moist, NCAT Cardio: Reg rate Chest: CTAB, normal effort, normal rate of breathing GI/Abdomen: BS +, non-tender, non-distended, PEG site a little red, no drainage Ext: no clubbing, cyanosis, or edema Psych: pleasant and cooperative  Skin: Clean and intact without signs of breakdown Neuro:  Alert today. Made eye contract. Answered simple questions and followed simple commands. Continue to be distracted and impulsive. Moves all 4's L>R. Senses pain in all 4's. Mild resting tone RUE/RLE but improved really Musculoskeletal: full rom, minimal to no LE edema    Assessment/Plan: 1. Functional deficits which require 3+ hours per day of interdisciplinary therapy in a comprehensive inpatient rehab setting. Physiatrist is providing close team supervision and 24 hour management of active medical problems listed below. Physiatrist and rehab team continue to assess barriers to discharge/monitor patient progress toward functional and medical goals  Care Tool:  Bathing    Body parts bathed by patient: Face, Chest, Left upper leg, Left lower leg, Left arm, Abdomen, Right upper leg   Body parts bathed by helper: Front perineal area, Right  lower leg, Left lower leg, Right arm, Buttocks     Bathing assist Assist Level: 2 Helpers     Upper Body Dressing/Undressing Upper body dressing   What is the patient wearing?: Pull over shirt    Upper body assist Assist Level: Maximal Assistance - Patient 25 - 49%    Lower Body Dressing/Undressing Lower body dressing      What is the patient wearing?: Pants     Lower body assist Assist for lower body dressing: Maximal Assistance - Patient 25 - 49%     Toileting Toileting    Toileting assist Assist for toileting: 2 Helpers     Transfers Chair/bed transfer  Transfers assist  Chair/bed transfer activity did not occur: Safety/medical concerns (lethargy)  Chair/bed transfer assist level: Dependent - mechanical lift (stedy)     Locomotion Ambulation   Ambulation assist   Ambulation activity did not occur: Safety/medical concerns          Walk 10 feet activity   Assist  Walk 10 feet activity did not occur: Safety/medical concerns        Walk 50 feet activity   Assist Walk 50 feet with 2 turns activity did not occur: Safety/medical concerns  Walk 150 feet activity   Assist Walk 150 feet activity did not occur: Safety/medical concerns         Walk 10 feet on uneven surface  activity   Assist Walk 10 feet on uneven surfaces activity did not occur: Safety/medical concerns         Wheelchair     Assist Is the patient using a wheelchair?: Yes Type of Wheelchair: Manual (TIS for safety)    Wheelchair assist level: Dependent - Patient 0% Max wheelchair distance: >150 ft    Wheelchair 50 feet with 2 turns activity    Assist        Assist Level: Dependent - Patient 0%   Wheelchair 150 feet activity     Assist      Assist Level: Dependent - Patient 0%   Blood pressure 117/76, pulse 69, temperature 97.8 F (36.6 C), resp. rate 19, height 6\' 5"  (1.956 m), weight 133 kg, SpO2 98 %. Medical Problem List and  Plan: 1. 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 He has made nice gains with speech and attention/awareness!             -patient may shower             -ELOS/Goals: 9-12 days, mod-to max assist goals  -Continue CIR therapies including PT, OT, and SLP   2.  Antithrombotics: -DVT/anticoagulation:  Pharmaceutical: Eliquis 5 mg BID for RLE DVT             -antiplatelet therapy: none 3. Pain Management: Tylenol as needed 4. Mood/Behavior/Sleep: LCSW to evaluate and provide supportive care             -antipsychotic agents: Seroquel 25 mg q HS             -propranolol 50mg  qid for restless, agitated behavior   -7/27 behavior controlled 5. Neuropsych/cognition: This patient is not capable of making decisions on his own behalf.             -7/27 increase ritalin to 10mg   -Continue ritalin at current dose for now 6. Skin/Wound Care: Routine skin care checks             -continue Gerhardts to perineum/sacral area 7. Fluids/Electrolytes/Nutrition: Routine Is and Os and follow-up chemistries             -Tube feeding via PEG: -Jevity 1.5 at 80 ml/hr (1920 ml per day); tolerating currently -Prosource TF 45 ml QID -FW flushes 200 mL q 8 hours (2059 mL daily)  discontinued CBGs -converting to bolus feeds--appreciate RD help.  -7/31 Tube feeding briefly held last night due to residuals  8: Dysphagia,Aspiration pneumonia; LLL>>completed antibiotics             -remains NPO, meds per PEG             -aspiration precautions             -continue Robitussin q 6 hours             -continue Atrovent nebs BID             -continue Xopenex nebs BID             -continue Duoneb BID PRN             -still with significant oro-motor issues on exam--repeat MBS per SLP.  9:  Clostridium difficile diarrhea/colitis: continue oral vanc to 8/1; continue probiotics  -No recent liquid stools 10: Acute urinary retention: resolved. Pvr's  minimal  -he is incontinent  -work on timed voids soon as cognition allows 11: Seizure-like activity: stable, continue Keppra  -order EEG 7/28, discussed no seizures evident with wife.  12: Subglottic tracheal narrowing: no airway compromise--oxygenating well 13: Anemia: multifactorial; stable H and H at HGB 12.2 14: C1 right transverse process fx, left occipital condyle fx>>out of collar 15: Acute ventilator respiratory failure requiring trach PEG 3/29             -decannulated, stoma closed      LOS: 6 days A FACE TO FACE EVALUATION WAS PERFORMED  Fanny Dance 07/20/2022, 1:35 PM

## 2022-07-20 NOTE — Progress Notes (Addendum)
Residual checked prior to AM feeding. 0 residual noted at this time.  1200 residual 150 ml.  1800 residual 50 ml. Pure wick put on per HS order. No S/S of distress. Rigoberto Noel  at bedside.

## 2022-07-20 NOTE — Progress Notes (Addendum)
Speech Language Pathology TBI Note  Patient Details  Name: Carl Carpenter MRN: [id number] Date of Birth: 04-28-1986  Today's Date: 07/20/2022 SLP Individual Time: 1110-1150 SLP Individual Time Calculation (min): 40 min  Short Term Goals: Week 1: SLP Short Term Goal 1 (Week 1): STGs=LTGs due to ELOS  Skilled Therapeutic Interventions: S: Pt seen this date for skilled ST intervention targeting deglutition and cognitive-linguistic goals outlined in care plan. Pt received awake/alert and lying semi-reclined in bed. No indications of pain. Intermittently verbally agitated. Father present. Assisted with repositioning in bed when given Mod-Max A multimodal cues for attention, initiation, problem-solving, and motor planning.   O: SLP facilitated today's session by providing Max A for oral care via suction toothbrush with pt following commands to perform oro-motor movements with Min A. Exhibited swallow initiation on ~80% of trials, particularly when slight downward pressure was placed on lingual surface when provided with 1/4 tsp sips of water + verbal cues to "swallow." Cough noted on ~50% of trials, with intermittent wet vocal quality, which appeared post-prandially; ? reflux. Demonstrated command following for throat clearing with Mod A (not always productive); however, not for producing volitional cough. SpO2 and HR parameters obtained at the end of today's session, and all WNL (SpO2 - 98%, HR - 82). Additionally, participated in structured naming task of common objects in room with 80% accuracy given Mod-Max A for improvement in intelligibility, particularly for production of velar and alveolar consonants. Subjectively, pt was only intelligible ~50% of the time at the word level, in a known context. Without context, pt is essentially unintelligible. Attended to task for 5 minute intervals with Mod A, and benefited from Max A for basic problem-solving with Yankauer, bed mobility, and improving speech  comprehensibility.   A: Pt remains somewhat stimulable for skilled ST intervention as evident by subtle improvement in consistency of swallow initiation during controlled thin water trials, contextual command following with Mod A vs Max A for bed mobility/problem-solving, and increased vocal intensity with verbalizations, though not always intelligible. Will plan MBSS for 07/21/2022.  P: Pt left in bed with all safety measures activated. Father present. Continue per current ST POC next session.   Pain Pain Assessment Pain Scale: 0-10 Pain Score: 0-No pain Faces Pain Scale: No hurt  Agitated Behavior Scale: TBI Observation Details Observation Environment: BR Start of observation period - Date: 07/20/22 Start of observation period - Time: 1200 End of observation period - Date: 07/20/22 End of observation period - Time: 1230 Agitated Behavior Scale (DO NOT LEAVE BLANKS) Short attention span, easy distractibility, inability to concentrate: Present to a moderate degree Impulsive, impatient, low tolerance for pain or frustration: Present to a moderate degree Uncooperative, resistant to care, demanding: Present to a slight degree Violent and/or threatening violence toward people or property: Absent Explosive and/or unpredictable anger: Present to a slight degree Rocking, rubbing, moaning, or other self-stimulating behavior: Present to a slight degree Pulling at tubes, restraints, etc.: Present to a moderate degree Wandering from treatment areas: Absent Restlessness, pacing, excessive movement: Absent Repetitive behaviors, motor, and/or verbal: Present to a slight degree Rapid, loud, or excessive talking: Absent Sudden changes of mood: Present to a slight degree Easily initiated or excessive crying and/or laughter: Absent Self-abusiveness, physical and/or verbal: Absent Agitated behavior scale total score: 25  Therapy/Group: Individual Therapy  Leona Alen A Spenser Cong 07/20/2022, 4:32 PM

## 2022-07-20 NOTE — Progress Notes (Signed)
Physical Therapy Session Note  Patient Details  Name: Carl Carpenter MRN: [id number] Date of Birth: February 28, 1986  Today's Date: 07/20/2022 PT Individual Time: 1345-1430 PT Individual Time Calculation (min): 45 min   Short Term Goals: Week 1:  PT Short Term Goal 1 (Week 1): STG=LTG due to ELOS.  Skilled Therapeutic Interventions/Progress Updates:     Pt received seated in tilt in space, working with OT. PT and OT together initially for [facility] evaluation. Pt transported to dayroom via WC.  PT provides functional mobility information to ATP. Pt performs sit to stand and stand step transfer form old WC to new WC with maxA +2 3 musketeers technique and cues for step sequencing and positioning. Following extended seated rest break, pt performs sit to stand with modA +2 and cues for initiation and anterior weight shift. Pt ambulates x40' with 3 musketeers technique and demonstrating initiation of R lower extremity progression >50% of time. Pt takes short stride lengths on L side and has difficulty maintaining upright trunk, requiring constant verbal and tactile cues. Pt also tends to buckle through R knee and hip, requiring blocking for safety. Following rest break, pt completes additional x40' with similar assist levels and cueing. Pt performs stedy transfer back to bed with significant other assisting, requiring mod/maxA +2 to complete with max cueing on hand placement and sequencing. Pt left supine with alarm intact and all needs within reach.  Therapy Documentation Precautions:  Precautions Precautions: Fall Precaution Comments: PEG Restrictions Weight Bearing Restrictions: No    Therapy/Group: Individual Therapy  [name], PT, DPT 07/20/2022, 5:43 PM

## 2022-07-21 ENCOUNTER — Inpatient Hospital Stay (HOSPITAL_COMMUNITY): Payer: Medicaid Other

## 2022-07-21 DIAGNOSIS — G479 Sleep disorder, unspecified: Secondary | ICD-10-CM

## 2022-07-21 DIAGNOSIS — R451 Restlessness and agitation: Secondary | ICD-10-CM

## 2022-07-21 DIAGNOSIS — S069X4S Unspecified intracranial injury with loss of consciousness of 6 hours to 24 hours, sequela: Secondary | ICD-10-CM

## 2022-07-21 DIAGNOSIS — R41841 Cognitive communication deficit: Secondary | ICD-10-CM

## 2022-07-21 DIAGNOSIS — S069X9S Unspecified intracranial injury with loss of consciousness of unspecified duration, sequela: Secondary | ICD-10-CM

## 2022-07-21 DIAGNOSIS — S069XAS Unspecified intracranial injury with loss of consciousness status unknown, sequela: Secondary | ICD-10-CM

## 2022-07-21 DIAGNOSIS — Z87820 Personal history of traumatic brain injury: Secondary | ICD-10-CM

## 2022-07-21 DIAGNOSIS — R339 Retention of urine, unspecified: Secondary | ICD-10-CM

## 2022-07-21 MED ORDER — CITALOPRAM HYDROBROMIDE 10 MG PO TABS
10.0000 mg | ORAL_TABLET | Freq: Every day | ORAL | Status: DC
  Administered 2022-07-21 – 2022-07-27 (×7): 10 mg
  Filled 2022-07-21 (×7): qty 1

## 2022-07-21 MED ORDER — TRAZODONE HCL 50 MG PO TABS
50.0000 mg | ORAL_TABLET | Freq: Every day | ORAL | Status: DC
  Administered 2022-07-21 – 2022-07-27 (×7): 50 mg
  Filled 2022-07-21 (×7): qty 1

## 2022-07-21 MED ORDER — METHYLPHENIDATE HCL 5 MG PO TABS
15.0000 mg | ORAL_TABLET | Freq: Two times a day (BID) | ORAL | Status: DC
  Administered 2022-07-21 – 2022-07-28 (×15): 15 mg
  Filled 2022-07-21 (×16): qty 3

## 2022-07-21 NOTE — Progress Notes (Signed)
Occupational Therapy TBI Note  Patient Details  Name: Carl Carpenter MRN: [id number] Date of Birth: February 20, 1986  Today's Date: 07/21/2022 Session 1 OT Individual Time: 8938-1017 OT Individual Time Calculation (min): 45 min   Short Term Goals: Week 1:  OT Short Term Goal 1 (Week 1): Pt's caregiver will demonstrate safety in performing stedy transfers OT Short Term Goal 2 (Week 1): Pt will maintain static sitting balance with no more than mod A OT Short Term Goal 3 (Week 1): Pt will complete sit > stand in the stedy with mod +2  Skilled Therapeutic Interventions/Progress Updates:  Session 1   Pt greeted semi-reclined in bed, awake, and answering questions by rehab tech. Treatment session focused on family education. Educated spouse on body mechanics and technique for supine to sit with max A, but still prefer +2 for bed mobility as pt will quickly loose balance any which way. Pt had good sitting balance this morning with mod A of 1 for 2 minutes at one point. Total A to thread pants and don sock and shoes at EOB. Sit<>stand in [place] with  mod A, but +2 needed to then move the stedy due to poor trunk control. Pt followed commands to stand again, then hand over hand to initiate hug with B UE's to hug his spouse. OT facilitated upright trunk and hips while hugging. He then needed verbal and tactile cues to terminate hug and return to sitting. Pt then quickly "fell asleep" and we were unable to wake him. He needed total A to don shirt due to lethargy. With sternal rubs and loud cues, pt opened eyes a few times. Spouse felt pt safe to stay up in wc as she would be there. Seatbelt placed on pt and pt left tilted in TIS with spouse present and needs met.   Therapy Documentation Precautions:  Precautions Precautions: Fall Precaution Comments: PEG Restrictions Weight Bearing Restrictions: No Pain: Pain Assessment Pain Scale: 0-10 Pain Score: 0-No pain Faces Pain Scale: No hurt Agitated Behavior  Scale: TBI Observation Details Observation Environment: CIR Start of observation period - Date: 07/21/22 Start of observation period - Time: 0945 End of observation period - Date: 07/21/22 End of observation period - Time: 1030 Agitated Behavior Scale (DO NOT LEAVE BLANKS) Short attention span, easy distractibility, inability to concentrate: Present to a moderate degree Impulsive, impatient, low tolerance for pain or frustration: Present to a moderate degree Uncooperative, resistant to care, demanding: Present to a slight degree Violent and/or threatening violence toward people or property: Absent Explosive and/or unpredictable anger: Absent Rocking, rubbing, moaning, or other self-stimulating behavior: Present to a slight degree Pulling at tubes, restraints, etc.: Present to a slight degree Wandering from treatment areas: Absent Restlessness, pacing, excessive movement: Absent Repetitive behaviors, motor, and/or verbal: Present to a slight degree Rapid, loud, or excessive talking: Absent Sudden changes of mood: Present to a moderate degree Easily initiated or excessive crying and/or laughter: Absent Self-abusiveness, physical and/or verbal: Absent Agitated behavior scale total score: 24     Therapy/Group: Individual Therapy  Valma Cava 07/21/2022, 12:07 PM

## 2022-07-21 NOTE — Progress Notes (Signed)
Physical Therapy Session Note  Patient Details  Name: Carl Carpenter MRN: [id number] Date of Birth: [birth year]  Today's Date: 07/21/2022 PT Individual Time: 1130-1210 and 1600-1630 PT Individual Time Calculation (min): 40 min and 30 min  Short Term Goals: Week 1:  PT Short Term Goal 1 (Week 1): STG=LTG due to ELOS.  Skilled Therapeutic Interventions/Progress Updates:     1st Session: Pt received supine in bed and agrees to therapy. No complaint of pain. Significant other, [name], present for session. PT provides education on PT recommendations for transfers following discharge, and discusses potential of using Stedy versus hoyer lift. Pt performs supine to sit with [name] assisting and PT providing cues for body mechanics for caregiver safety. Pt performs sit to stand x2 with [name] assisting and PT providing cues on hand placement, body mechanics, and tactile cueing to assist pt with transfer. PT provides minA in addition to caregiver's assistance to successfully stand. Pt then performs stand pivot to WC with modA +1 from PT. WC transport to gym. PT adjusts pt's foot rests for proper ergonomics. WC transport back to room. Stand pivot to bed with modA +1 and cues for initiation and sequencing. Left supine with alarm intact and all needs within reach  2nd Session: Upon PT entry into room, pt is asleep in WC, and pt had been very lethargic just earlier when working with OT. PT returns in 45 minutes and pt is awake and agreeable to therapy. No report of pain. WC transport to gym for time management. Pt performs sit to stand with modA +2 and attempts to ambulate with EVA walker. Pt ambulates x5' with eva walker and maxA +2, with very poor trunk control, flexed forward over walker and with difficulty clearing either leg during swing phase secondary to poor mechanics. Pt cued to take rest break. Pt then performs sit to stand with 3 musketeers technique and light modA +2, with cues for initiation and  sequencing. Pt ambulates x45' with modA/maxA +2 and good initiation with both lower extremities, but needing some manual assistance to increase stride length, as pt tends to perform step-to gait pattern. Pt verbalizes that he has had bowel movement and needs to return to room. Stand pivot back to bed with modA +1 and cues for sequencing and positioning. Pt performs bilateral rolling with modA to assist with pericare. Pt left supine with all needs within reach.  Therapy Documentation Precautions:  Precautions Precautions: Fall Precaution Comments: PEG Restrictions Weight Bearing Restrictions: No   Therapy/Group: Individual Therapy  [name], PT, DPT 07/21/2022, 5:34 PM

## 2022-07-21 NOTE — Progress Notes (Signed)
Modified Barium Swallow Progress Note  Patient Details  Name: Carl Carpenter MRN: [id number] Date of Birth: October 22, 1986  Today's Date: 07/21/2022  Modified Barium Swallow completed.  Full report located under Chart Review in the Imaging Section.  Brief recommendations include the following:  Clinical Impression MBSS completed. Pt presents with severe oropharyngeal dysphagia in the setting of severe TBI. Oral phase c/b bolus holding, poor bolus cohesion and manipulation due to generalized lingual weakness, incomplete A-P transit (piecemeal deglutition), and decreased posterior containment, Pharyngeal phase c/b delayed swallow initiation, decreased hyolaryngeal excursion, poor pharyngeal peristalsis, and decreased laryngeal closure. Severe UES dysfunction also noted with minimal relaxation appreciated with trials of thin liquid and puree.   Aforementioned oropharyngeal and UES dysfunction resulted in moderate pharyngeal stasis within the vallecular space, along the lateral channels, interarytenoid space, and in the pyriform sinuses, which was unsuccessfully cleared due to poor UES opening. This resulted in eventual penetration of puree textures with suspected aspiration (though could not visualize due to fluoro not being on) given strong, reflexive cough response. Penetration with resultant aspiration also appreciated with thin liquid trials (seltzer water and lemon juice) via tsp due to poor airway closure and poor UES relaxation. Pt's reflexive cough response did not fully clear aspirated material from trachea. It should be noted that pt exhibited increased swallow response/initiation was noted with use of seltzer water and lemon juice mixture with slight improvement in UES relaxation, though only small amount of bolus passing through at a time. Unable to trial compensatory and postural strategies due to severity of cognitive deficits.  Given instrumental findings, recommend continuation of NPO status  with long-term AMON via PEG. Strongly recommend consideration of ENT consult given severity of pt's UES dysfunction. Suspect if UES began to relax, pt would be able to better manage secretions and possible begin PO for pleasure. May give limited ice chips (5-10 per day) following meticulous oral care. May trial seltzer water and oral motor + CTAR exercises, if pt is able to obtain ST intervention post-discharge and has been seen by ENT to assess possible UES interventions. May benefit from repeat MBSS, following ENT intervention, if tx is deemed appropriate.   Swallow Evaluation Recommendations  Recommended Consults: Consider ENT evaluation   SLP Diet Recommendations: NPO;Alternative means - long-term   Medication Administration: Via alternative means   Postural Changes: Remain semi-upright after after feeds/meals (Comment);Seated upright at 90 degrees (during bolus feeds)   Oral Care Recommendations: Oral care QID;Staff/trained caregiver to provide oral care   Other Recommendations: Have oral suction available  Rochell Puett A Carron Mcmurry 07/21/2022,7:35 PM

## 2022-07-21 NOTE — Progress Notes (Signed)
Occupational Therapy Weekly Progress Note  Patient Details  Name: Carl Carpenter MRN: [id number] Date of Birth: 1986/07/28  Beginning of progress report period: July 15, 2022 End of progress report period: July 21, 2022  Patient has met 2 of 3 short term goals. [name] is making slow progress towards long term goals. Therapy this week has been limited this week due to lethargy. Pt much more verbal this week and at times inappropriate with his words. Will continue to address below deficits and assist with caregiver/family education and safety for going home. Expected d/c from CIR is scheduled for 8/8.  Patient continues to demonstrate the following deficits: muscle weakness and muscle paralysis, decreased cardiorespiratoy endurance, impaired timing and sequencing, abnormal tone, unbalanced muscle activation, ataxia, decreased coordination, and decreased motor planning, decreased attention to right and decreased motor planning, decreased initiation, decreased attention, decreased awareness, decreased problem solving, decreased safety awareness, decreased memory, delayed processing, and demonstrates behaviors consistent with [place] Level IV, and decreased sitting balance, decreased standing balance, decreased postural control, and decreased balance strategies and therefore will continue to benefit from skilled OT intervention to enhance overall performance with BADL and Reduce care partner burden.  Patient progressing toward long term goals..  Continue plan of care.  OT Short Term Goals Week 2:  OT Short Term Goal 1 (Week 2): LTG=STG  Therapy Documentation Precautions:  Precautions Precautions: Fall Precaution Comments: PEG Restrictions Weight Bearing Restrictions: No Therapy/Group: Individual Therapy  Catalina Lunger 07/21/2022, 12:10 PM
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
## 2022-07-21 NOTE — Progress Notes (Incomplete)
Patient ID: Calixto Vasilopoulos, male   DOB: 1986/08/05, 36 y.o.   MRN: [id number]  SW met with [name] in room to provide updates from team conference, and d/c date 8/8.  SW discussed DME ordered. States she has received a phone call from [employer] to order enteral feeds and will order at end of week. SW encouraged her to do the same with DME items that should be delivered. SW discussed CAP/DA referral. Reprots she still has not received a packet in the mail about this. She intends to follow-up with PCS case manager. SW informed will continue to explore HHA options as challenges at this time. [name] amenable to outpatient therapies if she has transportation. SW to explore if [facility] is an option.   Declined HHAs Kelly/[facility] Cory/[facility]- issues with staffing Jennifer/[facility] Ashley[facility] Amy/[facility] Cheryl/[facility]  Cecile Sheerer, MSW, [place] Office: [phone] Cell: [phone] Fax: [phone]
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
## 2022-07-21 NOTE — Progress Notes (Signed)
Occupational Therapy Session Note  Patient Details  Name: Carl Carpenter MRN: [id number] Date of Birth: 07-Dec-1986  Today's Date: 07/21/2022 OT Individual Time: 1400-1500 OT Individual Time Calculation (min): 60 min    Short Term Goals: Week 2:  OT Short Term Goal 1 (Week 2): LTG=STG  Skilled Therapeutic Interventions/Progress Updates:    Pt received supine in bed with SO [name] present in the room. Pt able to pull self up in long sitting 3x using the grab bar and Min A overall. Once in long sitting, pt required Mod A for static/dynamic sitting balance. Pt able to initiate threading LB clothing onto feet. Once feet where in, pt able to pull pants up in long sitting, lean back, and bridge to pull over hips with Mod cueing and Min A. SO hugging pt goodbye and pt initiated a kiss with her as she pulled away. Max A to sit EOB and maintain sitting balance. 3 muskateers technique to stand with Mod A +2 and pivot to w/c. Pt wheeled to sink for hair wash. Attempted to engage pt in assisting washing head using HOH but pt closing eyes and refusing to engage in task or follow any commands. Pt becoming extremely frustrated at any attempts to brush his hair or even have therapist brush hair. Wheeled out of the room to day gym for change in scenery and faced outside. Attempted activity but pt appears asleep. Required Max cueing to arouse pt. Pt very upset upon arousal and yelling. Provided pt with the choice to stay here or go to room. He voiced he wanted to go to the room.Pt left seated in TIS with belt on and father present in the room and all needs met.  Therapy Documentation Precautions:  Precautions Precautions: Fall Precaution Comments: PEG Restrictions Weight Bearing Restrictions: No  Therapy/Group: Individual Therapy  Catalina Lunger 07/21/2022, 3:06 PM

## 2022-07-22 DIAGNOSIS — R339 Retention of urine, unspecified: Secondary | ICD-10-CM

## 2022-07-22 DIAGNOSIS — S069X4S Unspecified intracranial injury with loss of consciousness of 6 hours to 24 hours, sequela: Secondary | ICD-10-CM

## 2022-07-22 DIAGNOSIS — R451 Restlessness and agitation: Secondary | ICD-10-CM

## 2022-07-22 DIAGNOSIS — R269 Unspecified abnormalities of gait and mobility: Secondary | ICD-10-CM
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
## 2022-07-22 NOTE — Progress Notes (Signed)
Peg tube dressing changed, no drainage or irration noted

## 2022-07-22 NOTE — Progress Notes (Signed)
Occupational Therapy TBI Note  Patient Details  Name: Carl Carpenter MRN: [id number] Date of Birth: Mar 25, 1986  Today's Date: 07/22/2022 OT Individual Time: 3474-2595 OT Individual Time Calculation (min): 56 min    Short Term Goals: Week 2:  OT Short Term Goal 1 (Week 2): LTG=STG  Skilled Therapeutic Interventions/Progress Updates:  Pt awake in bed with father present upon OT arrival to the room. Pt reports, "I don't try to be," referring to negative behaviors to family member that was reported prior to OT session. Pt in agreement for OT session.   Therapy Documentation Precautions:  Precautions Precautions: Fall Precaution Comments: PEG Restrictions Weight Bearing Restrictions: No Vital Signs: Please see "Flowsheet" for most recent vitals charted by nursing staff.  Pain: Pain Assessment Pain Scale: 0-10 Pain Score: 0-No pain Agitated Behavior Scale: TBI  07/22/22 1025  Observation Details  Observation Environment patient's room  Start of observation period - Date 07/22/22  Start of observation period - Time 0934  End of observation period - Date 07/22/22  End of observation period - Time 1030  Agitated Behavior Scale (DO NOT LEAVE BLANKS)  Short attention span, easy distractibility, inability to concentrate 3  Impulsive, impatient, low tolerance for pain or frustration 3  Uncooperative, resistant to care, demanding 3  Violent and/or threatening violence toward people or property 2  Explosive and/or unpredictable anger 2  Rocking, rubbing, moaning, or other self-stimulating behavior 1  Pulling at tubes, restraints, etc. 1  Wandering from treatment areas 1  Restlessness, pacing, excessive movement 2  Repetitive behaviors, motor, and/or verbal 1  Rapid, loud, or excessive talking 3  Sudden changes of mood 2  Easily initiated or excessive crying and/or laughter 1  Self-abusiveness, physical and/or verbal 1  Agitated behavior scale total score 26    ADL: Eating:  NPO Grooming: Maximal cueing, Maximal assistance (Pt requires maximal assistance to complete oral care using LUE. Pt requires hand-over-hand assist to perform adequate brushing motion and maintain swallow precautions. Pt requires maximal hand-over-hand assistance to perform facial hygiene and brush hair) Where Assessed-Grooming: Wheelchair Lower Body Dressing: Dependent (Pt requires maximal assistance x 2 to don shorts at bed level and then standing with moderate assist x 2 to stand while staff provides assist to pull clothing up.) Where Assessed-Lower Body Dressing: Bed level ADL Comments: Pt participates well in ADL session. Pt requires maximal assistance x 2 for supine <> sit transfer with L side exit out of bed. Pt able to complete a step-pivot transfer with moderate assistance x 2 with support under BUE for safety for bed <> w/c transfer. Pt demo's increased alertness during session today vs previous sessions (per chart review). Pt requires increased time to complete ADLs due to slowed processing, requiring increased assistance, and education for techniques.  Cognition/Environmental Awareness: Pt requests to go outside for change in environment and various sensory processing/input. Pt demo's good tolerance to sensory input to transition outside. Once outside, pt participates in visual scanning the environment to locate colors. Pt able to name 3/3 colors in the flowers and name pt's favorite color. Pt reports being outside is calming and pt enjoys being outside. Throughout session, OT encouraged visual attention to B sides and pt requires moderate cueing to visually scan and attend to therapist when attempting to initiate conversation. Pt requires increased time for participating in cognitive tasks due to slowed processing speed and allowing appropriate rest breaks with cognitive challenges.   Pt also requires increased time due to communication deficits to allow therapist  to understand and guide pt's  conversation throughout session.  Pt returned to bed at end of session. Pt left resting comfortably in bed with personal belongings and call light within reach, bed alarm on and activated, father present in room, SLP entering room, bed in low position, 3 bed rails up, and comfort needs attended to.   Therapy/Group: Individual Therapy  Lavera Guise 07/22/2022, 12:40 PM

## 2022-07-22 NOTE — Progress Notes (Addendum)
Patient ID: Osric Klopf, male   DOB: December 08, 1986, 36 y.o.   MRN: [id number]  SW left message for Delorise Jackson Bryant/Healthkeepers [phone]) with CAP/DA to discuss referral and when a packet will be mailed to the home. SW waiting on follow-up.   SW spoke with Brianna/Intake to discuss HHPT/OT/SLP/SN/aide referral. Reports she will relay message to Chance for review and will have her follow-up.  *Declined due to limited availability in area.   SW spoke with [facility] [phone]) to discuss setting up transportation. Reports to call [facility] Team at [facility] [phone]) to discuss further.SW left message to discuss arranging transportation and waiting on follow-up (can take up to 5 business days to f/u).   Declined HHAs Kelly/[facility] Cory/[facility]- issues with staffing Jennifer[facility] Ashley[facility] Amy/[facility] Cheryl/[facility] Vicki/[facility]- not in network   [place], MSW, [place] Office: [phone] Cell: [phone] Fax: [phone]

## 2022-07-22 NOTE — Progress Notes (Signed)
Speech Language Pathology Weekly Progress and Session Note  Patient Details  Name: Carl Carpenter MRN: [id number] Date of Birth: [birth year]  Beginning of progress report period: July 15, 2022 End of progress report period: July 22, 2022  Today's Date: 07/22/2022 SLP Individual Time: 6160-7371 SLP Individual Time Calculation (min): 45 min Missed Time: 15 mins, fatigue   Short Term Goals: Week 1: SLP Short Term Goal 1 (Week 1): STGs=LTGs due to ELOS SLP Short Term Goal 1 - Progress (Week 1): Progressing toward goal    New Short Term Goals: Week 2: SLP Short Term Goal 1 (Week 2): STGs=LTGs due to ELOS  Weekly Progress Updates: Patient continues to make slow progress towards LTGs. Currently, patient demonstrates increased verbal expression of wants/needs both at the word and phrase level. However, due to weakness and decreased ROM of oral musculature, intelligibility is exteremly impaired, especially without context. Patient participated in an MBS which showed a severe oropharyngeal dysphagia with recommendations to remain NPO at this time and continue with long-term alternative means of nutrition. Patient's level of arousal can fluctuate throughout the day with Max verbal cues needed for sustained attention to functional tasks and extra time needed to follow commands. Patient can also become verbally agitated at times. Patient and family education is ongoing. Recommend continued skilled SLP intervention to maximize his cognitive and swallowing function as well as his functional communication prior to discharge.      Intensity: Minumum of 1-2 x/day, 30 to 90 minutes Frequency: 3 to 5 out of 7 days Duration/Length of Stay: 07/28/22 Treatment/Interventions: Cognitive remediation/compensation;Cueing hierarchy;Dysphagia/aspiration precaution training;Environmental controls;Internal/external aids;Speech/Language facilitation;Patient/family education;Functional tasks;Multimodal communication  approach;Therapeutic Activities   Daily Session  Skilled Therapeutic Interventions:  Skilled treatment session focused on family education and speech goals. Upon arrival, patient had just returned from OT and requested to get back into bed. SLP facilitated session by providing education with the patient's father regarding his current swallowing function. The use of the MBS video was also used to reinforce information. Patient's father asking appropriate questions throughout. When given choices, patient requested to work on his swallowing function. SLP provided total A for oral care per the patient's request with him following commands appropriately throughout task. Patient's father then wiped the patient's mouth and the patient became verbally agitated. The patient's father was visibly upset by the patient's agitation, therefore, SLP had the family member step out into the hallway in order to provide proper education regarding current TBI progression, nature of agitation and strategies to utilize to minimize. Patient's father verbalized understanding but will need reinforcement of information. SLP then attempted to reengage patient in functional tasks, however, he remained with his eyes closed and was non responsive to multimodal cues/stimuli. Therefore, the session ended 15 minutes early. Will re-attempt to make up time as able. Patient left in bed with his father present. Continue with current plan of care.        Pain No/Denies Pain  Therapy/Group: Individual Therapy  Rune Mendez 07/22/2022, 6:30 AM
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
## 2022-07-22 NOTE — Progress Notes (Signed)
Patient voided in urinal and was very happy about it. Voided 100cc.

## 2022-07-22 NOTE — Progress Notes (Signed)
Physical Therapy Session Note  Patient Details  Name: Carl Carpenter MRN: [id number] Date of Birth: 09/18/1986  Today's Date: 07/22/2022 PT Individual Time: 1131-1200 and 1308-6578 PT Individual Time Calculation (min): 29 min   Short Term Goals: Week 1:  PT Short Term Goal 1 (Week 1): STG=LTG due to ELOS.  Skilled Therapeutic Interventions/Progress Updates:     1st Session: Pt received supine in bed, with eyes closed and not responding to cues from PT. After 2-3 minutes, pt opens eyes to verbal and tactile cueing and agrees to therapy. No complaint of pain. Supine to sit with cues for sequencing and positioning and maxA required. Pt performs stand step transfer to [facility] with modA +1 and cues for anterior weight shift, posture, and sequencing. WC transport to gym for time management. Pt performs multiple reps of sit to stand in parallel bars, requiring modA +1 and cues for initiation, body mechanics, and hand placement. Pt ambulates multiple bouts in parallel bars, typically ~5', with modA to maxA +1 due to lack of trunk control and frequent forward trunk lean, but with initiation of steps on both L and R, without manual assistance required. Pt requires mod to maxA for stand to sit with cues for sequencing. WC transport back to room. Stand pivot back to bed with modA. Left supine with alarm intact and all needs within reach.  2nd Session: Pt received supine in bed and agrees to therapy. No complaint of pain. Supine to sit with maxA +1 and cues for logrolling and sequencing. Stand step transfer to [facility] with modA +1 with cues for anterior weight shift, sequencing, positioning, and hand placement. WC transport to gym. Pt performs transfer and gait training and in parallel bars. Pt performs multiple reps of sit to stand with modA +1 (and several reps with minA), with cues for anterior weight shift, hand placement, posture, and sequencing. Mirror positioned for visual feedback to assist pt with trunk control and  positioning. Pt ambulates forward and backward with modA to maxA +1, with facilitation of lateral weight shifting, postural stability, foot placement, and hip/trunk extension. Pt has audible BM while standing, so assisted into sitting position and transported via wC back to room. Stand pivot to bed with modA. Pt performs bilateral rolling with modA to perform pericare, with cues for logrolling and hand placement. Supine to sit with maxA and modA +1 for stand pivot back to WC. Pt continues performing transfer training and balance training parallel bars. Pt has increased difficulty with trunk control upon return to parallel bars, likely related to fatigue, and requires maxA overall for postural stability and safety. WC transport back to room. Stand pivot to bed with modA and return to supine with maxA +1. Left with all needs within reach.  Therapy Documentation Precautions:  Precautions Precautions: Fall Precaution Comments: PEG Restrictions Weight Bearing Restrictions: No    Therapy/Group: Individual Therapy  [name], PT, DPT 07/22/2022, 5:13 PM

## 2022-07-23 DIAGNOSIS — S069X4S Unspecified intracranial injury with loss of consciousness of 6 hours to 24 hours, sequela: Secondary | ICD-10-CM

## 2022-07-23 DIAGNOSIS — R451 Restlessness and agitation: Secondary | ICD-10-CM

## 2022-07-23 DIAGNOSIS — R339 Retention of urine, unspecified: Secondary | ICD-10-CM

## 2022-07-23 MED ORDER — GUAIFENESIN-DM 100-10 MG/5ML PO SYRP
15.0000 mL | ORAL_SOLUTION | Freq: Three times a day (TID) | ORAL | Status: DC
  Administered 2022-07-23 – 2022-07-28 (×16): 15 mL
  Filled 2022-07-23 (×16): qty 20

## 2022-07-23 NOTE — Progress Notes (Signed)
Speech Language Pathology Daily Session Note  Patient Details  Name: Carl Carpenter MRN: [id number] Date of Birth: 03-13-1986  Today's Date: 07/23/2022 SLP Individual Time: 1100-1130 SLP Individual Time Calculation (min): 30 min  Short Term Goals: Week 2: SLP Short Term Goal 1 (Week 2): STGs=LTGs due to ELOS  Skilled Therapeutic Interventions: Skilled treatment session focused on family education with the patient's significant other. SLP provided education regarding how to administer thorough oral care via the suction toothbrush which the patient's significant other performed safely and accurately. Patient's significant other also administered small ice chips with education provided on frequency, size and how to palpate a swallow trigger. Patient consumed ice chips with overt cough X 2. Throughout trials, patient yelling out "more" and "sometime today" if he felt the ice chips were not being administered fast enough. Patient was incontinent of urine and followed directions for peri care with Min verbal and tactile cues. Patient left upright in bed with family present. Continue with current plan of care.      Pain No/Denies Pain   Therapy/Group: Individual Therapy  Lujain Kraszewski 07/23/2022, 3:28 PM

## 2022-07-23 NOTE — Discharge Instructions (Addendum)
Inpatient Rehab Discharge Instructions  Navi Erber Discharge date and time:  07/29/2022  Activities/Precautions/ Functional Status: Activity: no lifting, driving, or strenuous exercise until cleared by MD Diet:  NPO; continue tube feeds Wound Care: keep area around PEG site clean and dry Functional status:  ___ No restrictions     ___ Walk up steps independently _x__ 24/7 supervision/assistance   ___ Walk up steps with assistance ___ Intermittent supervision/assistance  ___ Bathe/dress independently ___ Walk with walker     ___ Bathe/dress with assistance ___ Walk Independently    ___ Shower independently _x__ Walk with assistance    __x_ Shower with assistance ___ No alcohol     ___ Return to work/school ________   COMMUNITY REFERRALS UPON DISCHARGE:    Outpatient: PT     OT    ST                 Agency: [facility]  Phone: [phone]             Appointment Date/Time: *please expect follow-up within 7-10 business days to schedule your appointment. If you have not received follow-up, be sure to contact the site directly.*  Medical Equipment/Items Ordered: hospital bed, tub transfer bench, 3in1 bedside commode, portable suction, enteral feeds (Jevity 1.5 and Prosource)                                                 Agency/Supplier: [employer] [phone]  Medical Equipment/Items Ordered: specialty wheelchair                                                 Agency/Supplier: [facility] [phone]   Medical Equipment/Items Ordered: incontinence supplies ( pull-ups, chux/underpads, gloves etc)                                                 Agency/Supplier: [employer] [phone]; liaison [phone]  GENERAL COMMUNITY RESOURCES FOR PATIENT/FAMILY: The following referrals were submitted:  1) CAP/DA with [employer] ([phone]). Ask for Freda Munro. Please be sure to call to check status of this referral since the packet has not been mailed to  the home.   2) [facility] Medicaid PCS referral made. Please be sure to make contact with the RN that came by your home.   Special Instructions: No driving, alcohol consumption or tobacco use.     My questions have been answered and I understand these instructions. I will adhere to these goals and the provided educational materials after my discharge from the hospital.  Patient/Caregiver Signature _______________________________ Date __________  Clinician Signature _______________________________________ Date __________  Please bring this form and your medication list with you to all your follow-up doctor's appointments.

## 2022-07-23 NOTE — Progress Notes (Signed)
Physical Therapy Session Note  Patient Details  Name: Carl Carpenter MRN: [id number] Date of Birth: 08/27/1986  Today's Date: 07/23/2022 PT Individual Time: 1300-1415 and 1534-1630 PT Individual Time Calculation (min): 75 min and 56 min  Short Term Goals: Week 1:  PT Short Term Goal 1 (Week 1): STG=LTG due to ELOS.  Skilled Therapeutic Interventions/Progress Updates:     1st Session: Pt received supine in bed and agrees to therapy. No complaint of pain. Pt verbalizes need to have bowel movement but says that he does not need to go just yet. Supine to sit with minA/modA and cues for logrolling and sequencing. Pt's significant other, [name], assists pt with donning gait belt and practices sit to stand transfer training/handling of pt. Pt performs sit to stand with PT providing minA in addition to [name]'s primary assistance. Pt has audible bowel movement in standing and returns to sitting. Pt then performs sit to stand with PT providing modA and cues for anterior weight shift and body mechanics. Stand pivot to [facility] with modA +2 with +2 assistance for doffing brief. Following toileting on [facility], pt stands with modA/maxA +1 and RN and rehab tech assist with performing pericare. Pt remains in standing to don brief and then sits to don shorts. Stand pivot to WC with modA +1. WC transport to gym for time management. Pt participates in ball tossing activity to challenge attention to task, coordination, and sitting balance. Pt stands and ambulates x65' with maxA +2, 3 musketeers technique.Cues provided for lateral weight shifting, upright posture, step length, and hip and trunk extension. WC transport back to room. Left seated in WC with all needs within reach.  2nd Session: Pt received seated in tilt in space WC and agrees to therapy. No complaint of pain. WC transport to gym for time management. Pt performs stand pivot transfer to mat with modA and cues for hand placement and sequencing. Pt works on sitting  balance in short sitting on edge of mat, requiring modA overall due to lack of trunk control, typically with posterior lean. Pt participates in basketball shooting activity to promote upright posture and attention to task, as well as coordination and trunk control. Pt reaches for ball, typically with L upper extremity, then cued to shoot basketball at hoop. Pt has difficulty manipulating ball with L hand but is able to complete with mod hand over hand assistance. Pt verbalizes fatigue and requests to return to [facility] for rest break. Squat pivot back to [facility] with cues for hand placement, initiation, and sequencing, with modA +2. Pt takes extended seated rest break partially reclined in chair. Pt stands from [facility] and ambulates x75' with modA +2 and bilateral initiation >75% of time. Cues provided for lateral weight shifting, upright posture, step length, and hip and trunk extension. WC transport back to room. Stand pivot to bed with modA +1 and cues for initiation, sequencing, and positioning. Sit to supine with minA/modA +1 and cues for sequencing. Left with alarm intact and all needs within reach,  Therapy Documentation Precautions:  Precautions Precautions: Fall Precaution Comments: PEG Restrictions Weight Bearing Restrictions: No    Therapy/Group: Individual Therapy  [name], PT, DPT 07/23/2022, 5:03 PM

## 2022-07-23 NOTE — Progress Notes (Signed)
Occupational Therapy TBI Note  Patient Details  Name: Carl Carpenter MRN: [id number] Date of Birth: 1986/02/27  Today's Date: 07/23/2022 OT Individual Time: 1003-1100 OT Individual Time Calculation (min): 57 min    Short Term Goals: Week 2:  OT Short Term Goal 1 (Week 2): LTG=STG  Skilled Therapeutic Interventions/Progress Updates:    Pt greeted semi-reclined in bed and agreeable with significant other Carl Carpenter present. Pt alert and agreeable to OT treatment session. Asked pt if he wanted to shower and pt stated "yes" with very expressive eyes. Pt excited to shower. Discussed with Carl Carpenter that showering is not recommended for him at home with their set-up of a tub and patients unpredictable trunk control. Obtained a tilt in space shower chair for shower. Pt eager to get OOB and impulsively coming into long sitting with min A. Reviewed body mechanics for assisting with bed mobility to [facility]. Mod A to get LE's off of bed and elevate trunk. Continued to give shannon tips for safety and transfers using Stedy. Prefer +2 for Stedy transfers due to unpredictable pushing and LOB. Bathing completed from TIS shower chair with improved initiation and following commands to wash 30% of body. Pt alert and assisting with all aspects of bathing. Stedy tansfer back to EOB with mod A +2 and multimodal cues to keep R LE planted on Stedy.  Worked on Engineer, site at [employer] with pt able to maintain static sitting with bouts of CGA, but mostly mod A and intermittent max A. Utilized backwards chaining for UB dressing. OT placed shirt overhead- then pt initiated bring R and L UE into shirt sleeve! He was able to get L UE in, but unable to get R UE due to ROM and strength limitations. Pt initiated leaning forward to thread pant legs but needed max A not to fall completely forward. OT placed box on sani wipes in between feet on Stedy to provide cue of foot placement as R LE tends to drift over to L side. Pt initiated multiple  sit<>stands in Stedy at EOB ranging from min to moderate assistance. OT provided verbal and tactile cues to facilitate full upright hips and trunk when standing. Pt returned to bed at end of session and left semi-reclined in bed with bed alarm on, call bell in reach, and needs met.  Therapy Documentation Precautions:  Precautions Precautions: Fall Precaution Comments: PEG Restrictions Weight Bearing Restrictions: No Pain: Pain Assessment Pain Scale: 0-10 Pain Score: 0-No pain Agitated Behavior Scale: TBI Observation Details Observation Environment: CIR Start of observation period - Date: 07/23/22 Start of observation period - Time: 1003 End of observation period - Date: 07/23/22 End of observation period - Time: 1100 Agitated Behavior Scale (DO NOT LEAVE BLANKS) Short attention span, easy distractibility, inability to concentrate: Present to a moderate degree Impulsive, impatient, low tolerance for pain or frustration: Present to a moderate degree Uncooperative, resistant to care, demanding: Absent Violent and/or threatening violence toward people or property: Absent Explosive and/or unpredictable anger: Absent Rocking, rubbing, moaning, or other self-stimulating behavior: Absent Pulling at tubes, restraints, etc.: Absent Wandering from treatment areas: Absent Restlessness, pacing, excessive movement: Present to a slight degree Repetitive behaviors, motor, and/or verbal: Present to a slight degree Rapid, loud, or excessive talking: Absent Sudden changes of mood: Absent Easily initiated or excessive crying and/or laughter: Absent Self-abusiveness, physical and/or verbal: Absent Agitated behavior scale total score: 20  Therapy/Group: Individual Therapy  Valma Cava 07/23/2022, 11:27 AM
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
## 2022-07-23 NOTE — Progress Notes (Signed)
Patient ID: Eliaz Fout, male   DOB: 07/08/1986, 36 y.o.   MRN: [id number]   SW spoke with [facility] Team at [facility] [phone]) to discuss transportation options. Reports pt will need to use insurance transportation offered.   SW spoke with pt so [name] to provide contact information for [employer] [phone]) to arrange transportation for medical appointments.   SW made efforts to schedule transportation home with [place], reports will need to arrange with [employer] transportation.  SW spoke with Josh/[employer] transportation who reported hospital discharge must be scheduled on day of discharge. SW updated medical team.   Cecile Sheerer, MSW, LCSWA Office: [phone] Cell: [phone] Fax: [phone]

## 2022-07-24 NOTE — Progress Notes (Signed)
Speech Language Pathology Daily Session Note  Patient Details  Name: Carl Carpenter MRN: [id number] Date of Birth: 21-Feb-1986  Today's Date: 07/24/2022 SLP Individual Time: 0916-1000 SLP Individual Time Calculation (min): 44 min  Short Term Goals: Week 2: SLP Short Term Goal 1 (Week 2): STGs=LTGs due to ELOS  Skilled Therapeutic Interventions: Pt seen for skilled ST with focus on swallowing and cognitive goals, pt quite sleepy initially but does rouse for therapeutic tasks requiring rest breaks. [name] present and reports he performed oral care this AM when patient was more alert/awake. Discussed recommendations for oral care via suction only at home, not regular toothbrush and toothpaste. Pt consuming 10 small ice chips requiring max A cues to attend to bolus this date, pt losing 2 trials anteriorly and demonstrating mod-severe delayed A-P transit and swallow initiation. Pt with cough response on 2/8 swallows and occasional wet vocal quality cleared with verbal cues for throat clear. Pt was internally and externally distracted during trials which impacted bolus awareness despite multimodal cues. Pt communicating at word and short phrase this date, able to answer yes/no questions with min A clarification cues. When asked what patient was thinking about during moment of staring off, pt states clearing "I am thinking about my wife and going home". [name] with no questions regarding results of MBSS, adminstration of ice chips and SLP recommendations at this time, cont ST POC.   Pain Pain Assessment Pain Scale: Faces Pain Score: Asleep Faces Pain Scale: No hurt  Therapy/Group: Individual Therapy  Tacey Ruiz 07/24/2022, 9:40 AM

## 2022-07-24 NOTE — Progress Notes (Signed)
Physical Therapy Weekly Progress Note  Patient Details  Name: Carl Carpenter MRN: [id number] Date of Birth: 1986-05-13  Beginning of progress report period: July 15, 2022 End of progress report period: July 24, 2022  Today's Date: 07/24/2022 PT Individual Time: 8453-6468 PT Individual Time Calculation (min): 70 min   Patient has met 0 of 0 short term goals. Short term goals not set for pt due to original length of stay estimation. Pt has, however, been progressing toward long term goals, with improved independence with bed mobility, functional transfers, balance, postural control, and ambulation. Pt continues to be limited by severity of brain injury and fluctuating levels of participation and initiation. Pt will benefit from emphasis on transfer training and hands on family education prior to discharge.   Patient continues to demonstrate the following deficits muscle weakness, decreased cardiorespiratoy endurance, impaired timing and sequencing, abnormal tone, motor apraxia, decreased coordination, and decreased motor planning, decreased attention to right, decreased initiation, decreased attention, decreased awareness, decreased problem solving, decreased safety awareness, decreased memory, delayed processing, and demonstrates behaviors consistent with [place] Level [place], and decreased sitting balance, decreased standing balance, decreased postural control, hemiplegia, and decreased balance strategies and therefore will continue to benefit from skilled PT intervention to increase functional independence with mobility.  Patient progressing toward long term goals..  Continue plan of care.  PT Short Term Goals Week 1:  PT Short Term Goal 1 (Week 1): STG=LTG due to ELOS. PT Short Term Goal 1 - Progress (Week 1): Progressing toward goal Week 2:  PT Short Term Goal 1 (Week 2): STGs = LTGs  Skilled Therapeutic Interventions/Progress Updates:     Pt received supine in bed and agrees to therapy. No  complaint of pain. Supine to sit with modA and cues for logrolling and sequencing. Pt performs sit to stand with modA but has strong posterior lean and requires abrupt sit back onto bed for safety. PT educates on importance of upright posture and on second attempt pt able to complete stand pivot to [facility] with modA +2 and cues for sequencing and positioning. WC transport to gym. Pt performs sit to stand with modA +2 and ambulates x50' with 3 musketeers technique, with facilitation of trunk extension, uprright gaze, lateral weight shifting, adequate stride length and weight shifting over stance leg, and attending to task due to pt becoming easily distracted. +3 WC follow for safety. Following extended seated rest break, pt ambulates x130' with same assist levels and improving gait mechanics and initiation bilaterally. Pt verbalizes need to use restroom. Taken back to room via [place]. Sit to stand with modA and pt's brief removed to find he had already had bowel movement. Transfer to [facility] with modA +2 and cues for body mechanics and positioning. Following toileting, pt stand with modA +1 and remains standing ~5 minutes with mod/maxA +1 while rehab tech performs pericare. Stand step transfer to bed with modA +3 [name] assists). Sit to supine with miNA/modA and cues for sequencing. Left with alarm intact and all needs within reach.  Therapy Documentation Precautions:  Precautions Precautions: Fall Precaution Comments: PEG Restrictions Weight Bearing Restrictions: No   Therapy/Group: Individual Therapy  Breck Coons, PT, DPT 07/24/2022, 5:55 PM

## 2022-07-24 NOTE — Progress Notes (Signed)
PROGRESS NOTE   Subjective/Complaints: Pt up in bed. Alert. Seemed to be asking when he was going home. Had a pretty good night of sleep. Was up once per dad  ROS: Limited due to cognitive/behavioral   Objective:   No results found. No results for input(s): "WBC", "HGB", "HCT", "PLT" in the last 72 hours.  No results for input(s): "NA", "K", "CL", "CO2", "GLUCOSE", "BUN", "CREATININE", "CALCIUM" in the last 72 hours.  No intake or output data in the 24 hours ending 07/24/22 1141       Physical Exam: Vital Signs Blood pressure 113/68, pulse 79, temperature 97.8 F (36.6 C), resp. rate 19, height 6\' 5"  (1.956 m), weight 131 kg, SpO2 98 %. Constitutional: No distress . Vital signs reviewed. HEENT: NCAT, EOMI, oral membranes moist Neck: supple Cardiovascular: RRR without murmur. No JVD    Respiratory/Chest: CTA Bilaterally without wheezes or rales. Normal effort    GI/Abdomen: BS +, non-tender, non-distended, PEG site clean/granulation Ext: no clubbing, cyanosis, or edema Psych: cooperative, impulsive, more engaging Neuro:    very alert. Dysarthric/nasal voice.   conversant.  Good eye contact  Moves all 4's  .    Senses pain in all 4's. Minimal resting tone RUE/RLE but improved really Musculoskeletal: full rom, minimal no LE edema    Assessment/Plan: 1. Functional deficits which require 3+ hours per day of interdisciplinary therapy in a comprehensive inpatient rehab setting. Physiatrist is providing close team supervision and 24 hour management of active medical problems listed below. Physiatrist and rehab team continue to assess barriers to discharge/monitor patient progress toward functional and medical goals  Care Tool:  Bathing    Body parts bathed by patient: Face, Abdomen, Chest, Right upper leg, Right arm   Body parts bathed by helper: Left arm, Front perineal area, Buttocks, Left upper leg, Right lower leg,  Left lower leg     Bathing assist Assist Level: 2 Helpers     Upper Body Dressing/Undressing Upper body dressing   What is the patient wearing?: Pull over shirt    Upper body assist Assist Level: Moderate Assistance - Patient 50 - 74%    Lower Body Dressing/Undressing Lower body dressing      What is the patient wearing?: Pants     Lower body assist Assist for lower body dressing: Moderate Assistance - Patient 50 - 74%     Toileting Toileting Toileting Activity did not occur and hygiene only): N/A (no void or bm)  Toileting assist Assist for toileting: 2 Helpers     Transfers Chair/bed transfer  Transfers assist  Chair/bed transfer activity did not occur: Safety/medical concerns (lethargy)  Chair/bed transfer assist level: Dependent - mechanical lift (stedy)     Locomotion Ambulation   Ambulation assist   Ambulation activity did not occur: Safety/medical concerns          Walk 10 feet activity   Assist  Walk 10 feet activity did not occur: Safety/medical concerns        Walk 50 feet activity   Assist Walk 50 feet with 2 turns activity did not occur: Safety/medical concerns         Walk 150 feet  activity   Assist Walk 150 feet activity did not occur: Safety/medical concerns         Walk 10 feet on uneven surface  activity   Assist Walk 10 feet on uneven surfaces activity did not occur: Safety/medical concerns         Wheelchair     Assist Is the patient using a wheelchair?: Yes Type of Wheelchair: Manual (TIS for safety)    Wheelchair assist level: Dependent - Patient 0% Max wheelchair distance: >150 ft    Wheelchair 50 feet with 2 turns activity    Assist        Assist Level: Dependent - Patient 0%   Wheelchair 150 feet activity     Assist      Assist Level: Dependent - Patient 0%   Blood pressure 113/68, pulse 79, temperature 97.8 F (36.6 C), resp. rate 19, height 6\' 5"   (1.956 m), weight 131 kg, SpO2 98 %. Medical Problem List and Plan: 1. 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 He has made nice gains with speech and attention/awareness!             -patient may shower             -ELOS/Goals:07/28/22, mod-to max assist goals  -Continue CIR therapies including PT, OT, and SLP     2.  Antithrombotics: -DVT/anticoagulation:  Pharmaceutical: Eliquis 5 mg BID for RLE DVT             -antiplatelet therapy: none 3. Pain Management: Tylenol as needed 4. Mood/Behavior/Sleep: LCSW to evaluate and provide supportive care             -antipsychotic agents: Seroquel dc'ed             -propranolol 50mg  qid for restless, agitated behavior   -  trazodone for sleep  -resumed celexa 10mg  qhs for mood/behavioral control 5. Neuropsych/cognition: This patient is not capable of making decisions on his own behalf.             -continue ritalin 15mg  bid--there is a very noticeable difference in his attention and initiation esp at this dose. BP/HR well controlled    6. Skin/Wound Care: Routine skin care checks             -continue Gerhardts to perineum/sacral area 7. Fluids/Electrolytes/Nutrition: Routine Is and Os and follow-up chemistries             -Tube feeding via PEG: -Jevity 1.5 at 80 ml/hr (1920 ml per day); tolerating currently -Prosource TF 45 ml QID -FW flushes 200 mL q 8 hours (2059 mL daily)  discontinued CBGs - tolerating bolus feeds without issue 8: Dysphagia,Aspiration pneumonia; LLL>>completed antibiotics              -aspiration precautions             -continue Robitussin, changed to TID             -continue Duoneb BID PRN             -still with significant oro-motor issues on MBS 8/1. Appears to have tight UES as well. CONTINUE NPO 9: Clostridium difficile diarrhea/colitis: vanc completed 8/1 -continue probiotics  -had liquid stool 8/2 10: Acute urinary retention:  resolved. Pvr's minimal  -he is incontinent but seems to have a little better awareness of emptying.  -working on timed  voids soon as cognition allows 11: Seizure-like activity: stable, continue Keppra  - EEG 7/28 with generalized slowing  12: Subglottic tracheal narrowing: no airway compromise--oxygenating well 13: Anemia: multifactorial; stable H and H at HGB 12.2 14: C1 right transverse process fx, left occipital condyle fx>>out of collar 15: Acute ventilator respiratory failure requiring trach PEG 3/29             -decannulated, stoma closed      LOS: 10 days A FACE TO FACE EVALUATION WAS PERFORMED  Ranelle Oyster 07/24/2022, 11:41 AM

## 2022-07-24 NOTE — Progress Notes (Signed)
Occupational Therapy TBI Note  Patient Details  Name: Carl Carpenter MRN: [id number] Date of Birth: 29-Oct-1986  Today's Date: 07/24/2022 OT Individual Time: 1003-1100 OT Individual Time Calculation (min): 57 min    Short Term Goals: Week 2:  OT Short Term Goal 1 (Week 2): LTG=STG  Skilled Therapeutic Interventions/Progress Updates:    Pt greeted semi-reclined in bed, wide awake, with father Carl Carpenter present. Pt agreeable to OT treatment session. Pt easier to understand today. He remembered that we showered yesterday and said he wanted to go to the gym. OT handed pt his pants, and he initiated bringing B LE's up and reached forward, he was able to thread B LE's into pant legs, then initiated hip bridge and reaching with min cues to integrate R UE. Pt following commands much quicker today. Upon bridging, noted that patient had been incontinent of bowel and bladder. Min A rolling while OT performed total A peri-care and brief change. Re-do of pants with mod A in similar fashion as stated above. Pt initiated bringing B LE's off of the bed an elevated trunk with mod A. Worked on sitting balance at EOB with OT providing verbal and visual cues to maintain midline. Pt able to maintain static sitting balance with intermittent bouts of supervision. Demonstrated much improved trunk control and overall body awareness. Worked on squat-pivot transfer from bed to [place] with min A and moderate verbal and tactile cues. Pt needed facilitation at the hips to elicit pivot, but then followed commands and min facilitation for head/hips relationship to scoot all the way in TIS wc. Pt stood again from wc with mod A +2 and improved power up from hips . Pt brought to the gym and into parallel bars, however pt fell asleep while being transported in wc. Utilized pt preferred music and sternal rubs, but unable to get pt alert enough to participate further. Pt would open eyes for short time, but could not stay awake long enough to  try standing activity. Pt returned to room and was left seated in TIS wc with wc belt on, alarm belt on, and needs met.  Therapy Documentation Precautions:  Precautions Precautions: Fall Precaution Comments: PEG Restrictions Weight Bearing Restrictions: No Pain: Pain Assessment Pain Scale: Faces Faces Pain Scale: No hurt Agitated Behavior Scale: TBI Observation Details Observation Environment: CIR Start of observation period - Date: 07/24/22 Start of observation period - Time: 1000 End of observation period - Date: 07/24/22 End of observation period - Time: 1100 Agitated Behavior Scale (DO NOT LEAVE BLANKS) Short attention span, easy distractibility, inability to concentrate: Present to a moderate degree Impulsive, impatient, low tolerance for pain or frustration: Present to a moderate degree Uncooperative, resistant to care, demanding: Absent Violent and/or threatening violence toward people or property: Absent Explosive and/or unpredictable anger: Absent Rocking, rubbing, moaning, or other self-stimulating behavior: Absent Pulling at tubes, restraints, etc.: Absent Wandering from treatment areas: Absent Restlessness, pacing, excessive movement: Present to a slight degree Repetitive behaviors, motor, and/or verbal: Present to a slight degree Rapid, loud, or excessive talking: Absent Sudden changes of mood: Absent Easily initiated or excessive crying and/or laughter: Absent Self-abusiveness, physical and/or verbal: Absent Agitated behavior scale total score: 20   Therapy/Group: Individual Therapy  Valma Cava 07/24/2022, 11:30 AM

## 2022-07-24 NOTE — Progress Notes (Addendum)
Occupational Therapy TBI Note  Patient Details  Name: Carl Carpenter MRN: [id number] Date of Birth: 08-25-1986  Today's Date: 07/24/2022 OT Individual Time: 1302-1330 OT Individual Time Calculation (min): 28 min   Short Term Goals: Week 2:  OT Short Term Goal 1 (Week 2): LTG=STG  Skilled Therapeutic Interventions/Progress Updates:    Pt greeted asleep in wc with father [name] present. Rehab tech informed OT that she pushed pt around unit to decrease agitation prior to OT arrival. Asked pt if he wanted to go outside and pt woke up saying "yes, let's Go. Once outside, visual scanning completed with max cues to wake, but min cues to locate flowering tree on there R side. Pt then fell back asleep. Tried to engage pt in reaching activity to pick flowers, but pt unable to stay alert. Pt returned to room and was able to wake to express want to return to bed. Max +2 needed for squat-pivot back to bed due to decreased arousal and initiation. Pt left semi-reclined in bed and nurse tech notified that patient had been incontinent of urine.    Therapy Documentation Precautions:  Precautions Precautions: Fall Precaution Comments: PEG Restrictions Weight Bearing Restrictions: No Pain:  Denies pain Agitated Behavior Scale: TBI Observation Details Observation Environment: CIR Start of observation period - Date: 07/24/22 Start of observation period - Time: 1302 End of observation period - Date: 07/24/22 End of observation period - Time: 1330 Agitated Behavior Scale (DO NOT LEAVE BLANKS) Short attention span, easy distractibility, inability to concentrate: Present to a moderate degree Impulsive, impatient, low tolerance for pain or frustration: Present to a moderate degree Uncooperative, resistant to care, demanding: Absent Violent and/or threatening violence toward people or property: Absent Explosive and/or unpredictable anger: Absent Rocking, rubbing, moaning, or other self-stimulating behavior:  Absent Pulling at tubes, restraints, etc.: Absent Wandering from treatment areas: Absent Restlessness, pacing, excessive movement: Present to a slight degree Repetitive behaviors, motor, and/or verbal: Present to a slight degree Rapid, loud, or excessive talking: Absent Sudden changes of mood: Absent Easily initiated or excessive crying and/or laughter: Absent Self-abusiveness, physical and/or verbal: Absent Agitated behavior scale total score: 20   Therapy/Group: Individual Therapy  Mal Amabile 07/24/2022, 1:54 PM

## 2022-07-24 NOTE — Progress Notes (Signed)
Patient ID: Carl Carpenter, male   DOB: 1986-12-20, 36 y.o.   MRN: [id number]  SW left message for Delorise Jackson Bryant/[employer] [phone]) with [facility] to discuss referral and when a packet will be mailed to the home. SW waiting on follow-up.   Cecile Sheerer, MSW, LCSWA Office: [phone] Cell: [phone] Fax: [phone]

## 2022-07-25 NOTE — Progress Notes (Signed)
Occupational Therapy TBI Note  Patient Details  Name: Carl Carpenter MRN: [id number] Date of Birth: 03/14/1986  Today's Date: 07/25/2022 OT Individual Time: 7253-6644 OT Individual Time Calculation (min): 60 min    Short Term Goals: Week 2:  OT Short Term Goal 1 (Week 2): LTG=STG  Skilled Therapeutic Interventions/Progress Updates:  Pt seen for skilled OT tx visit with fiance' present for training and participation in care. Tentative d/c for pt is early next week thus OT worked to ensure caregiver felt comfortable with all care and strategies. 2nd helper Therapy tech for posterior support at all times due to limitations with trunk control and intermittent impulsivity. Pt was able to move to EOB from supine with min a. All tasks with cues for safety and impulsivity throughout session. EOB for simple UB bathing with mod A, deodorant application with mod A, pull over shirt with max cues for hemi techniques and mod a. OT donned socks, tennis shoes, pull on shorts and incontinence brief for pt. Use pf Steady lift with fiance and OT with S by Therapy tech for safety due to pt's size and impulsivity. Once in TIS OT reinforced use of safety measures and left pt in the care of family and NT to take vitals.    Therapy Documentation Precautions:  Precautions Precautions: Fall Precaution Comments: PEG Restrictions Weight Bearing Restrictions: No    07/25/22 1500  Observation Details  Observation Environment CIR  Start of observation period - Date 07/25/22  Start of observation period - Time 0945  End of observation period - Date 07/25/22  End of observation period - Time 1045  Agitated Behavior Scale (DO NOT LEAVE BLANKS)  Short attention span, easy distractibility, inability to concentrate 3  Impulsive, impatient, low tolerance for pain or frustration 3  Uncooperative, resistant to care, demanding 1  Violent and/or threatening violence toward people or property 1  Explosive and/or  unpredictable anger 1  Rocking, rubbing, moaning, or other self-stimulating behavior 1  Pulling at tubes, restraints, etc. 1  Wandering from treatment areas 1  Restlessness, pacing, excessive movement 2  Repetitive behaviors, motor, and/or verbal 2  Rapid, loud, or excessive talking 1  Sudden changes of mood 1  Easily initiated or excessive crying and/or laughter 1  Self-abusiveness, physical and/or verbal 1  Agitated behavior scale total score 20    Therapy/Group: Individual Therapy  Carl Carpenter 07/25/2022, 3:08 PM

## 2022-07-26 NOTE — Progress Notes (Signed)
PROGRESS NOTE   Subjective/Complaints:  Pt  is " all right" per pt and mother.  Hearing an issue on L side- has a lot of wax, or has in past.  Mother helped pt transfer to bedside chair per mother- trying to stop him from falling.   Pt reports he's mad about situation of TBI.   ROS: limited by cognition  Objective:   No results found. No results for input(s): "WBC", "HGB", "HCT", "PLT" in the last 72 hours.  No results for input(s): "NA", "K", "CL", "CO2", "GLUCOSE", "BUN", "CREATININE", "CALCIUM" in the last 72 hours.   Intake/Output Summary (Last 24 hours) at 07/26/2022 1329 Last data filed at 07/26/2022 0644 Gross per 24 hour  Intake 774 ml  Output 1050 ml  Net -276 ml         Physical Exam: Vital Signs Blood pressure 121/79, pulse 82, temperature (!) 97.5 F (36.4 C), resp. rate 16, height 6\' 5"  (1.956 m), weight 131 kg, SpO2 98 %.    General: awake, alert, irritable,  mother at bedside;  NAD HENT: dysconjugate gaze; oropharynx moist- cannot see wax in L ear off hand CV: regular rate; no JVD Pulmonary: CTA B/L; no W/R/R- good air movement GI: soft, NT, ND, (+)BS Psychiatric: appropriate- but irritable about "Situation" Neurological: Ox1-2 Neuro:    very alert. Dysarthric/nasal voice.   conversant.  Good eye contact  Moves all 4's  .    Senses pain in all 4's. Minimal resting tone RUE/RLE but improved really Musculoskeletal: full rom, minimal no LE edema    Assessment/Plan: 1. Functional deficits which require 3+ hours per day of interdisciplinary therapy in a comprehensive inpatient rehab setting. Physiatrist is providing close team supervision and 24 hour management of active medical problems listed below. Physiatrist and rehab team continue to assess barriers to discharge/monitor patient progress toward functional and medical goals  Care Tool:  Bathing    Body parts bathed by patient: Face, Abdomen,  Chest, Right upper leg, Right arm   Body parts bathed by helper: Left arm, Front perineal area, Buttocks, Left upper leg, Right lower leg, Left lower leg     Bathing assist Assist Level: 2 Helpers     Upper Body Dressing/Undressing Upper body dressing   What is the patient wearing?: Pull over shirt    Upper body assist Assist Level: Moderate Assistance - Patient 50 - 74%    Lower Body Dressing/Undressing Lower body dressing      What is the patient wearing?: Pants, Incontinence brief     Lower body assist Assist for lower body dressing: Moderate Assistance - Patient 50 - 74%     Toileting Toileting Toileting Activity did not occur and hygiene only): N/A (no void or bm)  Toileting assist Assist for toileting: 2 Helpers     Transfers Chair/bed transfer  Transfers assist  Chair/bed transfer activity did not occur: Safety/medical concerns (lethargy)  Chair/bed transfer assist level: Dependent - mechanical lift (stedy)     Locomotion Ambulation   Ambulation assist   Ambulation activity did not occur: Safety/medical concerns          Walk 10 feet activity   Assist  Walk 10 feet activity did not occur: Safety/medical concerns        Walk 50 feet activity   Assist Walk 50 feet with 2 turns activity did not occur: Safety/medical concerns         Walk 150 feet activity   Assist Walk 150 feet activity did not occur: Safety/medical concerns         Walk 10 feet on uneven surface  activity   Assist Walk 10 feet on uneven surfaces activity did not occur: Safety/medical concerns         Wheelchair     Assist Is the patient using a wheelchair?: Yes Type of Wheelchair: Manual (TIS for safety)    Wheelchair assist level: Dependent - Patient 0% Max wheelchair distance: >150 ft    Wheelchair 50 feet with 2 turns activity    Assist        Assist Level: Dependent - Patient 0%   Wheelchair 150 feet activity      Assist      Assist Level: Dependent - Patient 0%   Blood pressure 121/79, pulse 82, temperature (!) 97.5 F (36.4 C), resp. rate 16, height 6\' 5"  (1.956 m), weight 131 kg, SpO2 98 %. Medical Problem List and Plan: 1. 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 He has made nice gains with speech and attention/awareness!             -patient may shower             -ELOS/Goals:07/28/22, mod-to max assist goals  -Continue CIR- PT, OT and SLP- d/c Tuesday 2.  Antithrombotics: -DVT/anticoagulation:  Pharmaceutical: Eliquis 5 mg BID for RLE DVT             -antiplatelet therapy: none 3. Pain Management: Tylenol as needed 4. Mood/Behavior/Sleep: LCSW to evaluate and provide supportive care             -antipsychotic agents: Seroquel dc'ed             -propranolol 50mg  qid for restless, agitated behavior   -  trazodone for sleep  -resumed celexa 10mg  qhs for mood/behavioral control 5. Neuropsych/cognition: This patient is not capable of making decisions on his own behalf.             -continue ritalin 15mg  bid--there is a very noticeable difference in his attention and initiation esp at this dose. BP/HR well controlled    6. Skin/Wound Care: Routine skin care checks             -continue Gerhardts to perineum/sacral area 7. Fluids/Electrolytes/Nutrition: Routine Is and Os and follow-up chemistries             -Tube feeding via PEG: -Jevity 1.5 at 80 ml/hr (1920 ml per day); tolerating currently -Prosource TF 45 ml QID -FW flushes 200 mL q 8 hours (2059 mL daily)  discontinued CBGs - tolerating bolus feeds without issue 8: Dysphagia,Aspiration pneumonia; LLL>>completed antibiotics              -aspiration precautions             -continue Robitussin, changed to TID             -continue Duoneb BID PRN             -still with significant oro-motor issues on MBS 8/1. Appears to have tight UES as  well.  CONTINUE NPO 9: Clostridium difficile diarrhea/colitis: vanc completed 8/1 -continue probiotics  -had liquid stool 8/2  8/6- some soft stools still, but better per wife.  10: Acute urinary retention: resolved. Pvr's minimal  -he is incontinent but seems to have a little better awareness of emptying.  -working on timed voids soon as cognition allows 11: Seizure-like activity: stable, continue Keppra  - EEG 7/28 with generalized slowing  12: Subglottic tracheal narrowing: no airway compromise--oxygenating well 13: Anemia: multifactorial; stable H and H at HGB 12.2 14: C1 right transverse process fx, left occipital condyle fx>>out of collar 15: Acute ventilator respiratory failure requiring trach PEG 3/29             -decannulated, stoma closed 16. L ear poor hearing  8/6- might benefit from L ear irrigation- forms a lot of wax per mother.     Pt's wife reports hospital bed was delivered- is NOT long enough- needs 1-2 bed extenders to be delivers to make it fit- he's 6 ft 6 inches.  Also asking for Rx's to be called in Monday so she can pick up before he comes home   I spent a total of 36   minutes on total care today- >50% coordination of care- due to d/w wife/mother about hearing/wax and bed issues   LOS: 12 days A FACE TO FACE EVALUATION WAS PERFORMED  Maleia Weems 07/26/2022, 1:29 PM

## 2022-07-26 NOTE — Progress Notes (Addendum)
Speech Language Pathology TBI Note  Patient Details  Name: Carl Carpenter MRN: [id number] Date of Birth: 04/15/1986  Today's Date: 07/26/2022 SLP Individual Time: 5697-9480 SLP Individual Time Calculation (min): 30 min  Short Term Goals: Week 2: SLP Short Term Goal 1 (Week 2): STGs=LTGs due to ELOS  Skilled Therapeutic Interventions:  Pt was seen for skilled ST targeting goals for dysphagia and speech.  Upon arrival, pt was resting in recliner with mom at bedside.  Pt awoke easily to voice and light touch and was agreeable to participating in treatment.  Pt needed total assist for thorough oral care prior to PO trials.  Pt consumed therapeutic trials of ice chips with delayed swallow initiation which appeared at least partly due to decreased awareness of boluses.  He demonstrated delayed strong coughing in 3 out of 3 trials.  Pt answered simple questions at the word level with mod cues for use of overarticulation to improve speech intelligibility.  There was only one word which therapist could not understand despite pt's attempts to repeat word.  Pt was left in recliner with chair alarm set and nursing at bedside, awaiting mother's return to room   Pain Pain Assessment Pain Scale: 0-10 Pain Score: 0-No pain  Agitated Behavior Scale: TBI Observation Details Observation Environment: CIR Start of observation period - Date: 07/26/22 Start of observation period - Time: 0900 End of observation period - Date: 07/26/22 End of observation period - Time: 0927 Agitated Behavior Scale (DO NOT LEAVE BLANKS) Short attention span, easy distractibility, inability to concentrate: Present to a moderate degree Impulsive, impatient, low tolerance for pain or frustration: Present to a moderate degree Uncooperative, resistant to care, demanding: Absent Violent and/or threatening violence toward people or property: Absent Explosive and/or unpredictable anger: Absent Rocking, rubbing, moaning, or other  self-stimulating behavior: Absent Pulling at tubes, restraints, etc.: Absent Wandering from treatment areas: Absent Restlessness, pacing, excessive movement: Absent Repetitive behaviors, motor, and/or verbal: Present to a slight degree Rapid, loud, or excessive talking: Absent Sudden changes of mood: Absent Easily initiated or excessive crying and/or laughter: Absent Self-abusiveness, physical and/or verbal: Absent Agitated behavior scale total score: 19  Therapy/Group: Individual Therapy  Hendrick Pavich, Melanee Spry 07/26/2022, 9:29 AM

## 2022-07-26 NOTE — Progress Notes (Signed)
Physical Therapy Session Note  Patient Details  Name: Carl Carpenter MRN: [id number] Date of Birth: 1986-04-09  Today's Date: 07/26/2022 PT Individual Time: 1016-1116 PT Individual Time Calculation (min): 60 min   Short Term Goals: Week 2:  PT Short Term Goal 1 (Week 2): STGs = LTGs  Skilled Therapeutic Interventions/Progress Updates:     Pt received seated in recliner and agrees to therapy. No complaint of pain. Mother present. PT has discussion with mother regarding transportation home. Pt performs stand step transfer to [facility] with modA +1 and cues for initiation, anterior weight shift, body mechanics, and sequencing. WC transport to gym. Stand step to Nustep with modA and same cues. Pt completes Nustep for endurance training, reciprocal coordination, work on trunk control, and attention to task. Pt completes x6:00 of active time on workload of 5 with average steps per minute~40. PT provided minA at trunk for stabilization and cues for hand and foot placement, as well as completing full ROM. Pt transfers to [facility] with modA +2. Pt becomes very fatigued and closes eye while seated in tilt in space WC. Pt does not respond to verbal or tactile cues, including sternal rub. After extended rest break in chair, tilt in space tilted forward and pt opens eyes, becoming more alert. Pt performs sit to stand with modA +2, then ambulates x30' with maxA +2 secondary to buckling when step sequencing is not optimal, and requiring +3 for WC follow. Pt begins to close eyes again. PT asks pt if he would like to ambulate again and he shakes his head "no". Pt asked if he would like to return to room to rest and pt nods head "yes". WC transport back to room. Left seated with all needs within reach. Pt misses 15 minutes of skilled PT secondary to fatigue.  Therapy Documentation Precautions:  Precautions Precautions: Fall Precaution Comments: PEG Restrictions Weight Bearing Restrictions: No    Therapy/Group: Individual  Therapy  [name], PT, DPT 07/26/2022, 12:46 PM

## 2022-07-27 ENCOUNTER — Other Ambulatory Visit (HOSPITAL_COMMUNITY): Payer: Self-pay

## 2022-07-27 DIAGNOSIS — J398 Other specified diseases of upper respiratory tract: Secondary | ICD-10-CM

## 2022-07-27 LAB — CBC
HCT: 35.7 % — ABNORMAL LOW (ref 39.0–52.0)
Hemoglobin: 12 g/dL — ABNORMAL LOW (ref 13.0–17.0)
MCH: 30.9 pg (ref 26.0–34.0)
MCHC: 33.6 g/dL (ref 30.0–36.0)
MCV: 92 fL (ref 80.0–100.0)
Platelets: 172 10*3/uL (ref 150–400)
RBC: 3.88 MIL/uL — ABNORMAL LOW (ref 4.22–5.81)
RDW: 12.6 % (ref 11.5–15.5)
WBC: 4.2 10*3/uL (ref 4.0–10.5)
nRBC: 0 % (ref 0.0–0.2)

## 2022-07-27 LAB — BASIC METABOLIC PANEL
Anion gap: 6 (ref 5–15)
BUN: 10 mg/dL (ref 6–20)
CO2: 29 mmol/L (ref 22–32)
Calcium: 9.7 mg/dL (ref 8.9–10.3)
Chloride: 103 mmol/L (ref 98–111)
Creatinine, Ser: 0.7 mg/dL (ref 0.61–1.24)
GFR, Estimated: >60 mL/min (ref >60)
Glucose, Bld: 93 mg/dL (ref 70–99)
Potassium: 3.8 mmol/L (ref 3.5–5.1)
Sodium: 138 mmol/L (ref 135–145)

## 2022-07-27 MED ORDER — GUAIFENESIN-DM 100-10 MG/5ML PO SYRP
15.0000 mL | ORAL_SOLUTION | Freq: Three times a day (TID) | ORAL | 0 refills | Status: AC
  Filled 2022-07-27: qty 237, 5d supply, fill #0

## 2022-07-27 MED ORDER — FREE WATER: 200.0000 mL | Freq: Three times a day (TID) | Status: AC

## 2022-07-27 MED ORDER — ACETAMINOPHEN 160 MG/5ML PO SOLN
650.0000 mg | Freq: Four times a day (QID) | ORAL | 0 refills | Status: AC | PRN
Start: 2022-07-27 — End: ?

## 2022-07-27 MED ORDER — METHYLPHENIDATE HCL 10 MG PO TABS
15.0000 mg | ORAL_TABLET | Freq: Two times a day (BID) | ORAL | 0 refills | Status: DC
  Filled 2022-07-27: qty 90, 30d supply, fill #0

## 2022-07-27 MED ORDER — TRAZODONE HCL 50 MG PO TABS
50.0000 mg | ORAL_TABLET | Freq: Every day | ORAL | 0 refills | Status: DC
  Filled 2022-07-27: qty 30, 30d supply, fill #0

## 2022-07-27 MED ORDER — CITALOPRAM HYDROBROMIDE 10 MG PO TABS
10.0000 mg | ORAL_TABLET | Freq: Every day | ORAL | 0 refills | Status: DC
  Filled 2022-07-27: qty 30, 30d supply, fill #0

## 2022-07-27 MED ORDER — JEVITY 1.5 CAL/FIBER PO LIQD
474.0000 mL | Freq: Four times a day (QID) | ORAL | Status: DC
Start: 2022-07-27 — End: 2022-08-12

## 2022-07-27 MED ORDER — SACCHAROMYCES BOULARDII 250 MG PO CAPS
250.0000 mg | ORAL_CAPSULE | Freq: Two times a day (BID) | ORAL | 0 refills | Status: DC
Start: 2022-07-27 — End: 2022-08-25

## 2022-07-27 MED ORDER — PROPRANOLOL HCL 20 MG/5ML PO SOLN
50.0000 mg | Freq: Four times a day (QID) | ORAL | 0 refills | Status: DC
  Filled 2022-07-27: qty 365, 8d supply, fill #0

## 2022-07-27 MED ORDER — GERHARDT'S BUTT CREAM
1.0000 | TOPICAL_CREAM | CUTANEOUS | Status: AC | PRN
Start: 2022-07-27 — End: ?

## 2022-07-27 MED ORDER — ORAL CARE MOUTH RINSE
15.0000 mL | Freq: Two times a day (BID) | OROMUCOSAL | 0 refills | Status: DC | PRN
Start: 2022-07-27 — End: 2022-11-18

## 2022-07-27 MED ORDER — APIXABAN 5 MG PO TABS
5.0000 mg | ORAL_TABLET | Freq: Two times a day (BID) | ORAL | 0 refills | Status: DC
  Filled 2022-07-27: qty 60, 30d supply, fill #0

## 2022-07-27 MED ORDER — LEVETIRACETAM 500 MG PO TABS
500.0000 mg | ORAL_TABLET | Freq: Two times a day (BID) | ORAL | 0 refills | Status: DC
  Filled 2022-07-27: qty 60, 30d supply, fill #0

## 2022-07-27 MED ORDER — PROSOURCE TF PO LIQD: 45.0000 mL | Freq: Four times a day (QID) | ORAL | Status: DC

## 2022-07-27 NOTE — Progress Notes (Signed)
Inpatient Rehabilitation Discharge Medication Review by a Pharmacist  A complete drug regimen review was completed for this patient to identify any potential clinically significant medication issues.  High Risk Drug Classes Is patient taking? Indication by Medication  Antipsychotic No   Anticoagulant Yes Apixaban- Hx DVT  Antibiotic No   Opioid No   Antiplatelet No   Hypoglycemics/insulin No   Vasoactive Medication Yes Inderal- HTN  Chemotherapy No   Other Yes Keppra- seizure ppx Ritalin- help regulate sleep / wake cycle s/p TBI Citalopram- mood/behavior Robitussin DM- secretions Florastor- probiotic Trazodone- sleep     Type of Medication Issue Identified Description of Issue Recommendation(s)  Drug Interaction(s) (clinically significant)     Duplicate Therapy     Allergy     No Medication Administration End Date     Incorrect Dose     Additional Drug Therapy Needed     Significant med changes from prior encounter (inform family/care partners about these prior to discharge).    Other  No PTA meds on record Quetiapine from inpatient admit stopped      Clinically significant medication issues were identified that warrant physician communication and completion of prescribed/recommended actions by midnight of the next day:  No   Time spent performing this drug regimen review (minutes):  30   Loura Back, PharmD, BCPS Clinical Pharmacist Clinical phone for 07/28/2022 until 3p is [phone] 07/28/2022 8:15 AM

## 2022-07-27 NOTE — Progress Notes (Signed)
Patient wife at bedside this shift. Transition of care brought patient's discharge medication up to floor. Patient wife received medication and voices understanding of medication. Will continue to monitor

## 2022-07-27 NOTE — Progress Notes (Signed)
Physical Therapy Discharge Summary  Patient Details  Name: Carl Carpenter MRN: [id number] Date of Birth: 05/18/1986  Today's Date: 07/27/2022 PT Individual Time: 2563-8937 PT Individual Time Calculation (min): 58 min    Patient has met 7 of 7 long term goals due to improved activity tolerance, improved balance, improved postural control, increased strength, improved attention, improved awareness, and improved coordination.  Patient to discharge ambulating with +2 assistance and able to perform transfers with modA +1. Pt does not have capacity to mobilize WC at this time and is performing transfers only for functional mobility.  Patient's care partner is independent to provide the necessary physical and cognitive assistance at discharge.  Reasons goals not met: NA  Recommendation:  Patient will benefit from ongoing skilled PT services in home health setting to continue to advance safe functional mobility, address ongoing impairments in strength, balance, attention, transfers, ambulation, and minimize fall risk.  Equipment: TIS WC  Reasons for discharge: treatment goals met and discharge from hospital  Patient/family agrees with progress made and goals achieved: Yes  Skilled Therapeutic Interventions: Pt received seated in [facility] and agrees to therapy. No complaint of pain. WC transport to gym for time management. Pt about to attempt ambulation but noted that he is soiled with urine. WC transport back to room. Stand pivot to bed with modA and cues for initiation, anterior weight shift, sequencing, and positioning. ModA for sit to supine. Pt bridges to assist with doffing brief and donning clean brief and shorts. ModA for return to sitting with cues for logrolling, sequencing, and positioning. Stand pivot back to TIS WC with modA and same cues. Return to gym. Pt performs sit to stand with modA +2 and cues for anterior weight shift. Pt ambulates x60' with 3 musketeers technique, with facilitation of  lateral weight shifting, upright posture, step placement, and hip and trunk extension. +3 for WC follow for safety. Following extended seated rest break, pt completes 47' with same assistance. Pt initiates steps on bilaterally >75% of time but frequently benefits from assistance to lengthen step, as he tends to utilize step-to pattern on L side. Following rest break, pt stands with modA/maxA +1. Pt cued to remain standing to practice standing balance, but pt begins to ambulate, requiring +2 for assistance. Pt assisted back to WC. Returns to room and performs stand pivot back to bed with modA +1. PT educates on proper pressure relief technique and [name] verbalizes understanding. Left supine with alarm intact and all needs within reach.  PT Discharge Precautions/Restrictions Precautions Precautions: Fall Precaution Booklet Issued: No Precaution Comments: PEG Restrictions Weight Bearing Restrictions: No Pain Interference Pain Interference Pain Effect on Sleep: 8. Unable to answer Pain Interference with Therapy Activities: 8. Unable to answer Pain Interference with Day-to-Day Activities: 8. Unable to answer Vision/Perception  Vision - History Ability to See in Adequate Light: 0 Adequate Perception Perception: Impaired Inattention/Neglect: Does not attend to right side of body  Cognition Overall Cognitive Status: Impaired/Different from baseline Arousal/Alertness: Awake/alert Focused Attention: Impaired Focused Attention Impairment: Verbal basic;Functional basic Memory: Impaired Memory Impairment: Retrieval deficit;Decreased recall of new information;Decreased short term memory Decreased Short Term Memory: Verbal basic;Functional basic Awareness: Impaired Awareness Impairment: Intellectual impairment Problem Solving: Impaired Problem Solving Impairment: Verbal basic;Functional basic Behaviors: Verbal agitation;Poor frustration tolerance;Perseveration Safety/Judgment: Impaired [employer] Scales of Cognitive Functioning: Confused, Inappropriate Non-Agitated Sensation Sensation Light Touch: Impaired by gross assessment Central sensation comments: decreased response to light touch on R, response to deep pressure, responsive to light touch  on L Light Touch Impaired Details: Impaired RLE;Impaired RUE Proprioception: Impaired by gross assessment Coordination Gross Motor Movements are Fluid and Coordinated: No Fine Motor Movements are Fluid and Coordinated: No Motor  Motor Motor: Motor impersistence;Abnormal tone;Motor perseverations;Clonus;Ataxia Motor - Discharge Observations: Improved from eval  Mobility Bed Mobility Bed Mobility: Supine to Sit;Sit to Supine;Rolling Right;Rolling Left Rolling Right: Supervision/verbal cueing Rolling Left: Supervision/Verbal cueing Supine to Sit: Moderate Assistance - Patient 50-74% Sit to Supine: Moderate Assistance - Patient 50-74% Transfers Transfers: Sit to Stand;Stand to Sit;Stand Pivot Transfers Sit to Stand: Moderate Assistance - Patient 50-74% Stand to Sit: Moderate Assistance - Patient 50-74% Stand Pivot Transfers: Moderate Assistance - Patient 50 - 74% Stand Pivot Transfer Details: Tactile cues for initiation;Tactile cues for posture;Verbal cues for sequencing;Verbal cues for technique;Tactile cues for weight shifting;Tactile cues for weight beaing;Manual facilitation for weight shifting Transfer (Assistive device): None Locomotion  Gait Ambulation: Yes Gait Assistance: 2 Helpers Gait Distance (Feet): 90 Feet Assistive device: 2 person hand held assist Gait Assistance Details: Verbal cues for sequencing;Verbal cues for gait pattern;Tactile cues for posture;Tactile cues for initiation;Tactile cues for sequencing;Tactile cues for placement;Verbal cues for technique;Tactile cues for weight shifting;Manual facilitation for weight shifting Gait Gait: Yes Gait Pattern: Impaired Gait Pattern: Ataxic Stairs / Additional  Locomotion Stairs: No Wheelchair Mobility Wheelchair Mobility: Yes Wheelchair Assistance: Dependent - Patient 0%  Trunk/Postural Assessment  Cervical Assessment Cervical Assessment: Exceptions to [facility] (forward head) Thoracic Assessment Thoracic Assessment: Exceptions to [facility] (rounded shoulders) Lumbar Assessment Lumbar Assessment: Exceptions to [facility] (posterior pelvic tilt) Postural Control Postural Control: Deficits on evaluation Head Control: delayed Trunk Control: grossly impaired Righting Reactions: grossly impaired Protective Responses: grossly impaired  Balance Balance Balance Assessed: Yes Static Sitting Balance Static Sitting - Balance Support: Feet unsupported;Bilateral upper extremity supported Static Sitting - Level of Assistance: 4: Min assist Dynamic Sitting Balance Dynamic Sitting - Balance Support: Feet supported;Bilateral upper extremity supported Dynamic Sitting - Level of Assistance: 3: Mod assist Static Standing Balance Static Standing - Balance Support: During functional activity;Bilateral upper extremity supported Static Standing - Level of Assistance: 2: Max assist Dynamic Standing Balance Dynamic Standing - Balance Support: During functional activity;Bilateral upper extremity supported Dynamic Standing - Level of Assistance: 1: +2 Total assist Extremity Assessment  RLE Assessment RLE Assessment: Exceptions to [facility] General Strength Comments: Grossly 3+ to 4/5 LLE Assessment LLE Assessment: Exceptions to [facility] General Strength Comments: Grossly at least 4/5    Breck Coons, PT, DPT 07/27/2022, 5:55 PM

## 2022-07-27 NOTE — Progress Notes (Addendum)
Occupational Therapy TBI Note  Patient Details  Name: Carl Carpenter MRN: [id number] Date of Birth: 01/18/1986  Today's Date: 07/27/2022 Session 1 OT Individual Time: 0950-1045 OT Individual Time Calculation (min): 55 min   Session 2 OT Individual Time: 1300-1330 OT Individual Time Calculation (min): 30 min   Short Term Goals: Week 2:  OT Short Term Goal 1 (Week 2): LTG=STG  Skilled Therapeutic Interventions/Progress Updates:  Session 1   Pt greeted semi-reclined bed awake, and eager to get up. Pt trying to get up out of bed. Noted pt to be incontinent of urine. Rolling in bed with min/mod A for total A peri-care and brief change. OT handed pt his pants and he initiated threading pants and pulling them up with mod A and min cues for sequencing and initiation. Pt initiated bed mobility with moderate assistance to elevate trunk. Worked on sitting balance at EOB with intermittent supervision/min A. Practiced squat pivot transfers with pt's father Jr. Jr. Demonstrated good body mechanics and provided appropriate cues for initiating transfer Pt brought to the gym in TIS wc. OT drew smiley face on window for pt to place Suctoin cup Squigz over top. Smiley face placed up high to facilitate more head, trunk, and hip extension. Pt needed mod to max A for standing balance, and +2 to hand pt Squiggz. Pt followed commands to place the squigzz with L hand and intermittent asistsance to maintain LE position. Pt took seated rest break, then stood again with focus on integrating R UE within reaching task. Pt initiated movement, then needed min/mod A due to shoulder weakness to get R UE to the Squigzz to pull them off. Improved grasp with R hand. Pt stated " I pooped." Pt with large incontinent loose BM. Worked on sit<>stand in Stedy with patient needing mod to max A to maintain standing while Tech and patients father assisted with peri-care. Pt often lifting LE's off of Stedy and needed constant cues to maintain  standing. Max A to thread pants then +2 to stand again and pull pants up. Pt left seated in TIS wc with seat belt on and handoff to SLP for next therapy session.   Session 2 Pt greeted semi-reclined in bed. Pt's brief was clean and he was eager to get OOB. Max  A to thread pants for time management, then mod A to sit EOB. Jr. Practiced stand-pivot transfer again with pt to weaker R side. Jr. Demonstrated good body mechanics and safety with transfer. Pt brought to therapy gym for UB there-ex. Using 1 lb dowel rod, pt completed 3 sets of 10 chest press with guided A at R shoulder. Pt counted outloud 1-10 without cues. OT assisted with R shoulder forward flexion 3 sets of 10. Assisted with making a birthday banner for pt. Pt initiated placing tape along paper edges with min A. Pt able to grasp pen with R hand, but then needed hand over hand A to write "Happy Birthday Carl Carpenter." Pt returned to room and left seated in TIS wc with family present, seat belt on, and needs met.   Therapy Documentation Precautions:  Precautions Precautions: Fall Precaution Comments: PEG Restrictions Weight Bearing Restrictions: No Pain:   Agitated Behavior Scale: TBI Observation Details Observation Environment: CIR Start of observation period - Date: 07/27/22 Start of observation period - Time: 0950 End of observation period - Date: 07/27/22 End of observation period - Time: 1045 Agitated Behavior Scale (DO NOT LEAVE BLANKS) Short attention span, easy distractibility, inability to concentrate: Present   to a moderate degree Impulsive, impatient, low tolerance for pain or frustration: Present to a slight degree Uncooperative, resistant to care, demanding: Absent Violent and/or threatening violence toward people or property: Absent Explosive and/or unpredictable anger: Present to a slight degree Rocking, rubbing, moaning, or other self-stimulating behavior: Absent Pulling at tubes, restraints, etc.: Absent Wandering  from treatment areas: Absent Restlessness, pacing, excessive movement: Absent Repetitive behaviors, motor, and/or verbal: Present to a slight degree Rapid, loud, or excessive talking: Absent Sudden changes of mood: Present to a slight degree Easily initiated or excessive crying and/or laughter: Absent Self-abusiveness, physical and/or verbal: Absent Agitated behavior scale total score: 20    Therapy/Group: Individual Therapy   S  07/27/2022, 2:07 PM 

## 2022-07-27 NOTE — Progress Notes (Incomplete)
Occupational Therapy Discharge Summary  Patient Details  Name: Maikel Neisler MRN: [id number] Date of Birth: [birth year] \  Patient has met {NUMBERS 0-12:18577} of {NUMBERS 0-12:18577} long term goals due to {due to:3041651}.  Patient to discharge at overall {LOA:3049010} level.  Patient's care partner {care partner:3041650} to provide the necessary {assistance:3041652} assistance at discharge.    Reasons goals not met: ***  Recommendation:  Patient will benefit from ongoing skilled OT services in {setting:3041680} to continue to advance functional skills in the area of {ADL/iADL:3041649}.  Equipment: {equipment:3041657}  Reasons for discharge: {Reason for discharge:3049018}  Patient/family agrees with progress made and goals achieved: {Pt/Family agree with progress/goals:3049020}  OT Discharge Precautions/Restrictions    General   Vital Signs   Pain   ADL ADL Eating: NPO Grooming: Maximal cueing, Maximal assistance (Pt requires maximal assistance to complete oral care using LUE. Pt requires hand-over-hand assist to perform adequate brushing motion and maintain swallow precautions. Pt requires maximal hand-over-hand assistance to perform facial hygiene and brush hair) Where Assessed-Grooming: Wheelchair Upper Body Bathing: Moderate assistance Where Assessed-Upper Body Bathing: Chair Lower Body Bathing: Dependent Where Assessed-Lower Body Bathing: Bed level Upper Body Dressing: Maximal assistance Where Assessed-Upper Body Dressing: Chair Lower Body Dressing: Dependent (Pt requires maximal assistance x 2 to don shorts at bed level and then standing with moderate assist x 2 to stand while staff provides assist to pull clothing up.) Where Assessed-Lower Body Dressing: Bed level Toileting: Dependent Where Assessed-Toileting: Bed level Toilet Transfer: Dependent Toilet Transfer Method: Other (comment) (stedy) Tub/Shower Transfer: Unable to assess ADL Comments: Pt participates  well in ADL session. Pt requires maximal assistance x 2 for supine <> sit transfer with L side exit out of bed. Pt able to complete a step-pivot transfer with moderate assistance x 2 with support under BUE for safety for bed <> w/c transfer. Pt demo's increased alertness during session today vs previous sessions (per chart review). Vision   Perception    Praxis   Cognition   Sensation   Motor    Mobility     Trunk/Postural Assessment     Balance   Extremity/Trunk Assessment       Valma Cava 07/27/2022, 2:27 PM

## 2022-07-27 NOTE — Progress Notes (Addendum)
PROGRESS NOTE   Subjective/Complaints:  Up in room, looking at dad's phone. Fixated on going home. No new issues this morning.   ROS: Limited due to cognitive/behavioral   Objective:   No results found. Recent Labs    07/27/22 0734  WBC 4.2  HGB 12.0*  HCT 35.7*  PLT 172    Recent Labs    07/27/22 0734  NA 138  K 3.8  CL 103  CO2 29  GLUCOSE 93  BUN 10  CREATININE 0.70  CALCIUM 9.7     Intake/Output Summary (Last 24 hours) at 07/27/2022 1414 Last data filed at 07/27/2022 0328 Gross per 24 hour  Intake 574 ml  Output 1000 ml  Net -426 ml         Physical Exam: Vital Signs Blood pressure 117/73, pulse 75, temperature 98.1 F (36.7 C), temperature source Oral, resp. rate 18, height 6\' 5"  (1.956 m), weight 131 kg, SpO2 97 %.    Constitutional: No distress . Vital signs reviewed. HEENT: NCAT, EOMI, oral membranes moist Neck: supple Cardiovascular: RRR without murmur. No JVD    Respiratory/Chest: CTA Bilaterally without wheezes or rales. Normal effort    GI/Abdomen: BS +, non-tender, non-distended Ext: no clubbing, cyanosis, or edema Psych: restless, cooperates Neuro:    very alert. Dysarthric/nasal voice.   conversant.  Good eye contact  Moves all 4's  .    Senses pain in all 4's. Minimal resting tone RUE/RLE but improved really Musculoskeletal: full rom, minimal no LE edema    Assessment/Plan: 1. Functional deficits which require 3+ hours per day of interdisciplinary therapy in a comprehensive inpatient rehab setting. Physiatrist is providing close team supervision and 24 hour management of active medical problems listed below. Physiatrist and rehab team continue to assess barriers to discharge/monitor patient progress toward functional and medical goals  Care Tool:  Bathing    Body parts bathed by patient: Face, Abdomen, Chest, Right upper leg, Right arm, Front perineal area, Left upper leg    Body parts bathed by helper: Right lower leg, Left lower leg, Buttocks, Left arm     Bathing assist Assist Level: Maximal Assistance - Patient 24 - 49%     Upper Body Dressing/Undressing Upper body dressing   What is the patient wearing?: Pull over shirt    Upper body assist Assist Level: Moderate Assistance - Patient 50 - 74%    Lower Body Dressing/Undressing Lower body dressing      What is the patient wearing?: Pants     Lower body assist Assist for lower body dressing: Moderate Assistance - Patient 50 - 74%     Toileting Toileting Toileting Activity did not occur and hygiene only): N/A (no void or bm)  Toileting assist Assist for toileting: 2 Helpers     Transfers Chair/bed transfer  Transfers assist  Chair/bed transfer activity did not occur: Safety/medical concerns (lethargy)  Chair/bed transfer assist level: Moderate Assistance - Patient 50 - 74%     Locomotion Ambulation   Ambulation assist   Ambulation activity did not occur: Safety/medical concerns          Walk 10 feet activity   Assist  Walk  10 feet activity did not occur: Safety/medical concerns        Walk 50 feet activity   Assist Walk 50 feet with 2 turns activity did not occur: Safety/medical concerns         Walk 150 feet activity   Assist Walk 150 feet activity did not occur: Safety/medical concerns         Walk 10 feet on uneven surface  activity   Assist Walk 10 feet on uneven surfaces activity did not occur: Safety/medical concerns         Wheelchair     Assist Is the patient using a wheelchair?: Yes Type of Wheelchair: Manual (TIS for safety)    Wheelchair assist level: Dependent - Patient 0% Max wheelchair distance: >150 ft    Wheelchair 50 feet with 2 turns activity    Assist        Assist Level: Dependent - Patient 0%   Wheelchair 150 feet activity     Assist      Assist Level: Dependent - Patient 0%    Blood pressure 117/73, pulse 75, temperature 98.1 F (36.7 C), temperature source Oral, resp. rate 18, height 6\' 5"  (1.956 m), weight 131 kg, SpO2 97 %. Medical Problem List and Plan: 1. 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 He has made nice gains with speech and attention/awareness!             -patient may shower             -ELOS/Goals:07/28/22, mod-to max assist goals  -Continue CIR therapies including PT, OT, and SLP   - Due to the patient's indefinite urinary incontinence (R32) related to his TBI, he will need to use diaper/incontinence supplies  to best manage his incontinence and protect the integrity of his skin. 2.  Antithrombotics: -DVT/anticoagulation:  Pharmaceutical: Eliquis 5 mg BID for RLE DVT             -antiplatelet therapy: none 3. Pain Management: Tylenol as needed 4. Mood/Behavior/Sleep: LCSW to evaluate and provide supportive care             -antipsychotic agents: Seroquel dc'ed             -propranolol 50mg  qid for restless, agitated behavior HOME MED  -trazodone for sleep  -continue celexa 10mg  qhs for mood/behavioral control--HOME MED 5. Neuropsych/cognition: This patient is not capable of making decisions on his own behalf.             -continue ritalin 15mg  bid--HOME MED    6. Skin/Wound Care: Routine skin care checks             -continue Gerhardts to perineum/sacral area 7. Fluids/Electrolytes/Nutrition: Routine Is and Os and follow-up chemistries             -Tube feeding via PEG: -Jevity 1.5 at 80 ml/hr (1920 ml per day); tolerating currently -Prosource TF 45 ml QID -FW flushes 200 mL q 8 hours (2059 mL daily)  discontinued CBGs - tolerating bolus feeds without issue 8: Dysphagia,Aspiration pneumonia; LLL>>completed antibiotics              -aspiration precautions             -continue Robitussin, changed to TID--HOME MED             -continue Duoneb BID PRN---HOME  MED             -  still with significant oro-motor issues on MBS 8/1. Appears to have tight UES as well. CONTINUE NPO 9: Clostridium difficile diarrhea/colitis: vanc completed 8/1 -continue probiotics  -had liquid stool 8/2  8/6- some soft stools still, but better per wife.  10: Acute urinary retention: resolved. Pvr's minimal  -he is incontinent but seems to have a little better awareness of emptying.  -working on timed voids soon as cognition allows 11: Seizure-like activity: stable, continue Keppra--HOME MED  - EEG 7/28 with generalized slowing  12: Subglottic tracheal narrowing: no airway compromise--oxygenating well 13: Anemia: multifactorial; stable H and H at HGB 12.2 14: C1 right transverse process fx, left occipital condyle fx>>out of collar 15: Acute ventilator respiratory failure requiring trach PEG 3/29             -decannulated, stoma closed 16. L ear poor hearing  8/6- ? benefit from L ear irrigation- forms a lot of wax per mother.         LOS: 13 days A FACE TO FACE EVALUATION WAS PERFORMED  Ranelle Oyster 07/27/2022, 2:14 PM

## 2022-07-27 NOTE — Progress Notes (Signed)
Speech Language Pathology Discharge Summary  Patient Details  Name: Carl Carpenter MRN: [id number] Date of Birth: [birth year]  Today's Date: 07/27/2022 SLP Individual Time: 1045-1130 SLP Individual Time Calculation (min): 45 min   Skilled Therapeutic Interventions:  Skilled treatment session focused on dysphagia and cognitive goals. Upon arrival, patient was awake while upright in the wheelchair and was mildly perseverative on wanting to talk to his wife/wanting his wife to be present but was easily redirected. SLP facilitated session by providing total A for oral care via the suction toothbrush. Patient consumed trials of ice chips with increased oral manipulation and initiated a swallow in 100% of opportunities with more than a reasonable amount of time needed. Patient with decreased awareness of bolus at times resulting in anterior spillage of saliva. Patient with consistent coughing across all trials. Recommend patient remain NPO. SLP also facilitated session with a structured speech task in which patient had to name 2 functional items and identify the similarities. Patient was ~80% intelligible at the word and phrase level with Mod verbal cues for use of speech intelligibility strategies. At end of session, patient quickly became lethargic and was difficult to rouse. Patient left asleep while semi-reclined in the tilt-in-space wheelchair with his father present. Patient's father did not report any further questions and feels prepared for discharge.   Patient has met 3 of 3 long term goals.  Patient to discharge at overall Mod level.   Reasons goals not met: N/A  Clinical Impression/Discharge Summary: Patient continues to make slow gains and has met 3 of 3 LTGs this admission. Currently, patient demonstrates behaviors consistent with a Rancho Level IV-emerging V with his overall level alertness fluctuating throughout the due to lethargy with intermittent episodes of verbal agitation ongoing.  Patient requires overall Max-Total A to complete functional and familiar tasks safely in regards to problem solving, recall and awareness. However, patient demonstrates improved sustained attention to functional tasks requiring overall Mod A multimodal cues. Patient can express his basic wants/needs at the word and and phrase level but requires overall Mod-Max A multimodal cues for use of a slow rate and over-articulation to improve speech intelligibility to 75%.  Patient remains NPO with long-term alternative means of nutrition due to a severe oropharyngeal dysphagia and can consume small amounts of ice chips after meticulous oral care. Patient and family education is complete and patient will discharge home with 24 hour supervision from family. Patient would benefit from f/u SLP services to maximize his functional communication, cognitive functioning, and swallowing function in order to reduce caregiver burden.   Care Partner:  Caregiver Able to Provide Assistance: Yes  Type of Caregiver Assistance: Physical;Cognitive  Recommendation:  Home Health SLP;24 hour supervision/assistance  Rationale for SLP Follow Up: Reduce caregiver burden;Maximize functional communication;Maximize cognitive function and independence;Maximize swallowing safety   Equipment: Suction   Reasons for discharge: Discharged from hospital;Treatment goals met   Patient/Family Agrees with Progress Made and Goals Achieved: Yes    [place], [place] 07/27/2022, 6:26 AM

## 2022-07-27 NOTE — Progress Notes (Signed)
Patient ID: Carl Carpenter, male   DOB: Sep 12, 1986, 36 y.o.   MRN: [id number]  SW spoke with pt partner Carl Carpenter to inform on how transportation home will be scheduled tomorrow morning per insurance policy it can not be arranged until day of discharge. She confirms all DME delivered expect 3in1 bedside commode and prosource. States she received 2 cases of Jevity 2.5 on Friday. She reports she will also need an extender for his bed.  She will follow-up with SW about preferred outpatient location near her home, if they do not offer all three therapies, SW will send referral to [facility] location. She would like to use [facility] pharmacy for medications at d/c. SW updated PA-Sandra on this request.   SW informed Christina[facility] on issue with DME. Will place service ticket for [facility] as it is indicated as delivered. She also received order for bed extender. SW provided Joan/[facility] Medicaid form needed for enteral feeds. Reports an order for delivery will be placed on day of discharge.   SW sent incontinence supplies referral to [facility] [employer] [employer]   *SW spoke with Carl Carpenter to discuss outpatient location. Preferred [facility] [phone]: [phone]). SW informed on above about incontinence supplies. SW reviewed SW discharge portion.   SW faxed outpatient referral for PT/OT/SLP.  Cecile Sheerer, MSW, LCSWA Office: [phone] Cell: [phone] Fax: [phone]

## 2022-07-28 DIAGNOSIS — J398 Other specified diseases of upper respiratory tract: Secondary | ICD-10-CM

## 2022-07-28 DIAGNOSIS — S069X9S Unspecified intracranial injury with loss of consciousness of unspecified duration, sequela: Secondary | ICD-10-CM

## 2022-07-28 MED ORDER — CARBAMIDE PEROXIDE 6.5 % OT SOLN
5.0000 [drp] | Freq: Two times a day (BID) | OTIC | Status: DC
  Administered 2022-07-28: 5 [drp] via OTIC
  Filled 2022-07-28: qty 15

## 2022-07-28 NOTE — Plan of Care (Signed)
Pt to d/c today 

## 2022-07-28 NOTE — Progress Notes (Signed)
Inpatient Rehabilitation Care Coordinator Discharge Note   Patient Details  Name: Carl Carpenter MRN: [id number] Date of Birth: 06-Jul-1986   Discharge location: D/c to home with significant other [name]. Support from parents and other relatives.  Length of Stay: 13 days  Discharge activity level: Dependent  Home/community participation: Limited  Patient response OJ:JKKXFG Literacy - How often do you need to have someone help you when you read instructions, pamphlets, or other written material from your doctor or pharmacy?: Never  Patient response HW:EXHBZJ Isolation - How often do you feel lonely or isolated from those around you?: Patient unable to respond  Services provided included: MD, RD, PT, OT, SLP, CM, Pharmacy, Neuropsych, SW, TR, Industrial/product designer Services:  Field seismologist Utilized: Media planner [place] Medicaid [employer]  Choices offered to/list presented to: Yes  Follow-up services arranged:  DME, Outpatient, Patient/Family has no preference for HH/DME agencies    Outpatient Servicies: [facility] for PT/OT/SLP DME : [facility] for- hospital bed, tub transfer bench, 3in1 bedside commode, portable suction, enteral feeds (Jevity 1.5 and Prosource); [facility] for  specialty wheelchair; [employer] for incontinence supplies ( pull-ups, chux/underpads, gloves etc)    Patient response to transportation need: Is the patient able to respond to transportation needs?: Yes In the past 12 months, has lack of transportation kept you from medical appointments or from getting medications?: No In the past 12 months, has lack of transportation kept you from meetings, work, or from getting things needed for daily living?: No   Comments (or additional information): referrals were made for the following- CAP/DA with [employer] ([phone]) and   2) [facility] Medicaid [facility] referral. Family has been encouraged to follow-up about the status of each  referral.   Patient/Family verbalized understanding of follow-up arrangements:  Yes  Individual responsible for coordination of the follow-up plan: contact pt s/o [name]  Confirmed correct DME delivered: Gretchen Short 07/28/2022    Gretchen Short

## 2022-07-28 NOTE — Plan of Care (Signed)
  Problem: RH Balance Goal: LTG: Patient will maintain dynamic sitting balance (OT) Description: LTG:  Patient will maintain dynamic sitting balance with assistance during activities of daily living (OT) Outcome: Not Applicable   Problem: RH Dressing Goal: LTG Patient will perform upper body dressing (OT) Description: LTG Patient will perform upper body dressing with assist, with/without cues (OT). Outcome: Not Applicable   Problem: RH Attention Goal: LTG Patient will demonstrate this level of attention during functional activites (OT) Description: LTG:  Patient will demonstrate this level of attention during functional activites  (OT) Outcome: Not Applicable   Problem: RH Pre-functional/Other (Specify) Goal: RH LTG OT (Specify) 1 Description: RH LTG OT (Specify) 1 Outcome: Not Applicable Goal: RH LTG OT (Specify) 2 Description: RH LTG OT (Specify) 2  Outcome: Not Applicable

## 2022-07-28 NOTE — Progress Notes (Signed)
Patient ID: Everson Mott, male   DOB: [birth year], 36 y.o.   MRN: [id number]  SW spoke with [employer] [phone]) to schedule ambulance transportation home for pt. Ref #[id number]. Reports someone will call with transportation provider. SW provided contact information for self and 4W nurses station.  *[facility] ambulance scheduled for pick  up at 11:45am  SW met with pt s/o [name],  pt mother and father to inform on above. SW reviewed discharge from social work standpoint. SW informed will send order for incontinence supplies today. She has received supplies needed until shipment arrives.   SW provided clinicals and incontinence order to Stephen/[employer].  Loralee Pacas, MSW, [place] Office: [phone] Cell: [phone] Fax: [phone]
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
## 2022-07-31 ENCOUNTER — Telehealth: Payer: Self-pay

## 2022-07-31 NOTE — Telephone Encounter (Addendum)
Patient discharged on Tuesday and his bowels are runny and more frequent. She states his stomach is making very loud noises. She was told by Dr. Riley Kill to call if it continues. Please advise in Dr. Riley Kill absence.  Spoke to Dr. Berline Chough and in Dr. Riley Kill last note from the hospital he states that patient needs to take fiber and probiotics.   Called [name] backed to notified her. She states in the hospital they were giving him Florstar and she wanted to know if he needed to continue that or just take an OTC probiotic. She has been giving him Culterelle.  [name] has been notified any probiotic will work.

## 2022-08-03 ENCOUNTER — Other Ambulatory Visit (HOSPITAL_COMMUNITY): Payer: Self-pay

## 2022-08-03 ENCOUNTER — Telehealth: Payer: Self-pay

## 2022-08-03 MED ORDER — VANCOMYCIN 50 MG/ML ORAL SOLUTION
125.0000 mg | Freq: Four times a day (QID) | ORAL | 0 refills | Status: DC
Start: 2022-08-03 — End: 2022-08-03

## 2022-08-03 MED ORDER — DIPHENOXYLATE-ATROPINE 2.5-0.025 MG PO TABS: 1.0000 | ORAL_TABLET | Freq: Four times a day (QID) | ORAL | 2 refills | Status: DC | PRN

## 2022-08-03 MED ORDER — VANCOMYCIN 50 MG/ML ORAL SOLUTION: 125.0000 mg | Freq: Four times a day (QID) | ORAL | 0 refills | Status: DC

## 2022-08-03 NOTE — Addendum Note (Signed)
Addended by: Faith Rogue T on: 08/03/2022 01:06 PM   Modules accepted: Orders

## 2022-08-03 NOTE — Telephone Encounter (Signed)
I've called [name] as well as his pharmacy.   Repeating oral vancomycin  Prn lomotil  Otc fiber/probiotic

## 2022-08-03 NOTE — Telephone Encounter (Signed)
Patient's SO called and stated the patient still has runny stools. She states that she gave him Culterelle probiotics and that has not helped any. She also stated that he is saying he is thirsty, but she cannot give him anything to drink because he is aspirating on his saliva. She wants to know what can be done. Please advise

## 2022-08-04 ENCOUNTER — Other Ambulatory Visit (HOSPITAL_COMMUNITY): Payer: Self-pay

## 2022-08-05 ENCOUNTER — Telehealth: Payer: Self-pay | Admitting: *Deleted

## 2022-08-05 ENCOUNTER — Other Ambulatory Visit (HOSPITAL_COMMUNITY): Payer: Self-pay

## 2022-08-05 MED ORDER — METRONIDAZOLE 500 MG PO TABS: ORAL_TABLET | ORAL | 0 refills | Status: DC

## 2022-08-05 MED ORDER — PROPRANOLOL HCL 20 MG/5ML PO SOLN: 50.0000 mg | Freq: Four times a day (QID) | ORAL | 1 refills | Status: DC

## 2022-08-05 MED ORDER — VANCOMYCIN+SYRSPEND SF 50 MG/ML PO SUSP: 2.5000 mL | Freq: Four times a day (QID) | ORAL | 0 refills | Status: DC

## 2022-08-05 NOTE — Telephone Encounter (Signed)
Prior Berkley Harvey was being required for the vancomycin solution. Brand name is required for [facility] [employer]. With Dr Riley Kill Approval, it was changed to Vancomycin +SYRSPEND SF 50mg /ml. No further PA should be required.   After speaking with patients significant other, all pharmacies are out of the solution. Per Dr the medication has been changed to MetroNIDAZOLE 500mg  per tube as directed. I have let Riley Kill know.  Propanolol refilled with 30 day supply.

## 2022-08-06 ENCOUNTER — Other Ambulatory Visit (HOSPITAL_BASED_OUTPATIENT_CLINIC_OR_DEPARTMENT_OTHER): Payer: Self-pay

## 2022-08-10 ENCOUNTER — Telehealth: Payer: Self-pay

## 2022-08-10 NOTE — Telephone Encounter (Addendum)
Verbal okay given to [facility] with [facility] to provide in the home,  private duty nursing care 8-10 hours per day.   Call back phone [phone].

## 2022-08-12 ENCOUNTER — Telehealth: Payer: Self-pay | Admitting: *Deleted

## 2022-08-12 MED ORDER — JEVITY 1.5 CAL/FIBER PO LIQD: 474.0000 mL | Freq: Four times a day (QID) | ORAL | 5 refills | Status: AC

## 2022-08-12 NOTE — Telephone Encounter (Signed)
Carl Carpenter (EC) called and reports he has been shorted on his TF cans. [facility] had him on 6 cans per day and he is supposed to be on 8 cans per day. I have verified this information and spoken with [facility]. New order faxed over showing 8 cans per day/ 240 cans/month, 56,880 ml per month needs to be shipped to them. Faxed to [phone].

## 2022-08-19 ENCOUNTER — Telehealth: Payer: Self-pay

## 2022-08-19 MED ORDER — METHYLPHENIDATE HCL 10 MG PO TABS
15.0000 mg | ORAL_TABLET | Freq: Two times a day (BID) | ORAL | 0 refills | Status: DC
Start: 2022-08-19 — End: 2022-08-25

## 2022-08-19 NOTE — Telephone Encounter (Signed)
Carl Carpenter was told by the Home Health nurse  that "Banana Flakes " will bulk up stools.   Per Dr. Riley Kill any fiber will bulk up stools. Carl Carpenter stated she will continue to use the fiber on hand. She also reported he is now having less  BM's. But they are still runny.   Call back phone [phone].

## 2022-08-19 NOTE — Telephone Encounter (Signed)
error 

## 2022-08-19 NOTE — Telephone Encounter (Signed)
Filled  Written  ID  Drug  QTY  Days  Prescriber  RX #  Dispenser  Refill  Daily Dose*  Pymt Type  PMP  08/03/2022 08/03/2022 2  Diphenoxylate-Atrop 2.5-0.025 60.00 15 Za Swa [id number] Nor (9229) 0/2 0.00 MME Medicaid [place] 07/27/2022 07/27/2022 3  Methylphenidate 10 Mg Tablet 90.00 30 Sa Set [id number] Mos (5648) 0/0  Other [place] 12/23/2021 12/23/2021 1  Oxycodone-Acetaminophen 5-325 8.00 1 Da Fis [id number] Nor (9229) 0/0 60.00 MME Private Pay [place]

## 2022-08-19 NOTE — Telephone Encounter (Signed)
Rx written and sent to the pharmacy. Thanks!  

## 2022-08-21 NOTE — Telephone Encounter (Signed)
Yes. You can change the feeding order for Carl Carpenter to bulk up his stools.  Please send the order to [facility].

## 2022-08-25 ENCOUNTER — Other Ambulatory Visit (HOSPITAL_COMMUNITY): Payer: Self-pay

## 2022-08-25 ENCOUNTER — Telehealth: Payer: Self-pay | Admitting: *Deleted

## 2022-08-25 MED ORDER — PROPRANOLOL HCL 40 MG PO TABS: 40.0000 mg | ORAL_TABLET | Freq: Four times a day (QID) | ORAL | 3 refills | Status: DC

## 2022-08-25 MED ORDER — METHYLPHENIDATE HCL 10 MG PO TABS: 15.0000 mg | ORAL_TABLET | Freq: Two times a day (BID) | ORAL | 0 refills | Status: DC

## 2022-08-25 MED ORDER — SACCHAROMYCES BOULARDII 250 MG PO CAPS
250.0000 mg | ORAL_CAPSULE | Freq: Two times a day (BID) | ORAL | 3 refills | Status: AC
Start: 2022-08-25 — End: ?

## 2022-08-25 MED ORDER — LEVETIRACETAM 500 MG PO TABS: 500.0000 mg | ORAL_TABLET | Freq: Two times a day (BID) | ORAL | 3 refills | Status: DC

## 2022-08-25 MED ORDER — CITALOPRAM HYDROBROMIDE 10 MG PO TABS
10.0000 mg | ORAL_TABLET | Freq: Every day | ORAL | 3 refills | Status: DC
Start: 2022-08-25 — End: 2022-12-25

## 2022-08-25 MED ORDER — APIXABAN 5 MG PO TABS
5.0000 mg | ORAL_TABLET | Freq: Two times a day (BID) | ORAL | 3 refills | Status: DC
Start: 2022-08-25 — End: 2022-11-20

## 2022-08-25 MED ORDER — TRAZODONE HCL 50 MG PO TABS: 50.0000 mg | ORAL_TABLET | Freq: Every day | ORAL | 3 refills | Status: DC

## 2022-08-25 NOTE — Telephone Encounter (Signed)
Carl Carpenter called and reports Carl Carpenter is needing refills on all his meds. He does not have a PCP listed and I do not see in discharge note any mention of him getting one. His return appt to our office is 09/16/22.

## 2022-08-25 NOTE — Telephone Encounter (Signed)
Meds refilled. I spoke with [name] about the loose stool as well. She will check with [facility] and pharmacy to see if he has coverage for any elemental formulas which may help.

## 2022-09-16 ENCOUNTER — Inpatient Hospital Stay: Payer: Medicaid Other | Admitting: Physical Medicine & Rehabilitation

## 2022-09-18 ENCOUNTER — Encounter: Payer: Medicaid Other | Attending: Physical Medicine & Rehabilitation | Admitting: Registered Nurse

## 2022-09-18 ENCOUNTER — Encounter: Payer: Self-pay | Admitting: Registered Nurse

## 2022-09-18 VITALS — BP 105/72 | HR 82 | Ht 77.0 in

## 2022-09-18 DIAGNOSIS — S069X1D Unspecified intracranial injury with loss of consciousness of 30 minutes or less, subsequent encounter: Secondary | ICD-10-CM | POA: Diagnosis not present

## 2022-09-18 MED ORDER — METHYLPHENIDATE HCL 10 MG PO TABS
15.0000 mg | ORAL_TABLET | Freq: Two times a day (BID) | ORAL | 0 refills | Status: DC
Start: 2022-09-18 — End: 2022-09-18

## 2022-09-18 MED ORDER — METHYLPHENIDATE HCL 10 MG PO TABS: 15.0000 mg | ORAL_TABLET | Freq: Two times a day (BID) | ORAL | 0 refills | Status: DC

## 2022-09-18 NOTE — Patient Instructions (Addendum)
Dr Bobbye Morton : Peg Tube [phone]  [place]. Constance Holster : Otolaryngology [phone] [address]  [address] hyt
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
## 2022-09-21 ENCOUNTER — Other Ambulatory Visit: Payer: Self-pay | Admitting: Physical Medicine & Rehabilitation

## 2022-11-18 ENCOUNTER — Other Ambulatory Visit: Payer: Self-pay | Admitting: Physical Medicine & Rehabilitation

## 2022-11-18 ENCOUNTER — Encounter: Payer: Self-pay | Admitting: Physical Medicine & Rehabilitation

## 2022-11-18 ENCOUNTER — Encounter: Payer: Medicaid Other | Attending: Physical Medicine & Rehabilitation | Admitting: Physical Medicine & Rehabilitation

## 2022-11-18 VITALS — BP 124/83 | HR 85 | Ht 78.0 in

## 2022-11-18 DIAGNOSIS — R1312 Dysphagia, oropharyngeal phase: Secondary | ICD-10-CM | POA: Insufficient documentation

## 2022-11-18 DIAGNOSIS — Z87891 Personal history of nicotine dependence: Secondary | ICD-10-CM | POA: Insufficient documentation

## 2022-11-18 DIAGNOSIS — F32A Depression, unspecified: Secondary | ICD-10-CM | POA: Insufficient documentation

## 2022-11-18 DIAGNOSIS — Z79899 Other long term (current) drug therapy: Secondary | ICD-10-CM | POA: Insufficient documentation

## 2022-11-18 DIAGNOSIS — S069X3A Unspecified intracranial injury with loss of consciousness of 1 hour to 5 hours 59 minutes, initial encounter: Secondary | ICD-10-CM | POA: Insufficient documentation

## 2022-11-18 DIAGNOSIS — R251 Tremor, unspecified: Secondary | ICD-10-CM | POA: Insufficient documentation

## 2022-11-18 DIAGNOSIS — Z931 Gastrostomy status: Secondary | ICD-10-CM | POA: Diagnosis not present

## 2022-11-18 DIAGNOSIS — S069X3D Unspecified intracranial injury with loss of consciousness of 1 hour to 5 hours 59 minutes, subsequent encounter: Secondary | ICD-10-CM

## 2022-11-18 DIAGNOSIS — S069X3S Unspecified intracranial injury with loss of consciousness of 1 hour to 5 hours 59 minutes, sequela: Secondary | ICD-10-CM | POA: Diagnosis present

## 2022-11-18 DIAGNOSIS — R1314 Dysphagia, pharyngoesophageal phase: Secondary | ICD-10-CM | POA: Insufficient documentation

## 2022-11-18 MED ORDER — TRAZODONE HCL 100 MG PO TABS: 100.0000 mg | ORAL_TABLET | Freq: Every day | ORAL | 4 refills | Status: DC

## 2022-11-18 MED ORDER — METHYLPHENIDATE HCL 10 MG PO TABS: 15.0000 mg | ORAL_TABLET | Freq: Two times a day (BID) | ORAL | 0 refills | Status: DC

## 2022-11-18 NOTE — Patient Instructions (Signed)
ALWAYS FEEL FREE TO CALL OUR OFFICE WITH ANY PROBLEMS OR QUESTIONS [phone])  **PLEASE NOTE** ALL MEDICATION REFILL REQUESTS (INCLUDING CONTROLLED SUBSTANCES) NEED TO BE MADE AT LEAST 7 DAYS PRIOR TO REFILL BEING DUE. ANY REFILL REQUESTS INSIDE THAT TIME FRAME MAY RESULT IN DELAYS IN RECEIVING YOUR PRESCRIPTION.                   DECREASE KEPPRA TO ONCE DAILY  INCREASE TRAZODONE TO 100MG  AT BEDTIME.

## 2022-11-18 NOTE — Progress Notes (Signed)
Subjective:    Patient ID: Carl Carpenter, male    DOB: Mar 02, 1986, 36 y.o.   MRN: [id number]  HPI  Carl Carpenter is here in follow up of his severe traumatic brain injury. I last saw him in the office.  He is getting some limited therapy at [facility] after a few visits at neuro-rehab. He is receiving SLP and had an MBS yesterday. He was cleared for tastes of puree for pleasure. He is still having motility issues and UES doesn't seem to be relaxing. He does have a good strong cough. Although he can sound wet if he becomes fatigued.   He's doing stand-pivot transfers and is able to do some steps to turn. Therapy is working on balance. His right side is still weak and spastic to a certain extent but it has improved. He utilizes his left side primarily for functional movement but is engaging the right side more.   From a bowel and bladder standpoint he is still incontinent. His stool has firmed up after they changed to isosource TF.   His mood has been much calmer since going home. He has had his moments but they have been limited. He doesn't seem depressed. Carl Carpenter says his sleep isn't that great. He remains on keppra for sz prophylaxis although he did not have a confirmed seizure, EEG negative. He uses ritalin for arousal and initiation. Celexa for depression and trazodone for sleep, propranolol for tremor and mood.   Pain Inventory Average Pain 0 Pain Right Now 0 My pain is  NO PAIN  LOCATION OF PAIN  No pain  BOWEL Number of stools per week: 7-12   BLADDER Normal    Mobility walk with assistance ability to climb steps?  no do you drive?  no needs help with transfers Do you have any goals in this area?  yes  Function I need assistance with the following:  feeding, dressing, bathing, toileting, meal prep, and household duties Do you have any goals in this area?  yes  Neuro/Psych bladder control problems bowel control problems No problems in this area tremor confusion  Prior  Studies Swallowing test   Physicians involved in your care Any changes since last visit?  yes  [facility] on Carl Carpenter in [place]  History reviewed. No pertinent family history. Social History   Socioeconomic History   Marital status: Significant Other    Spouse name: Not on file   Number of children: Not on file   Years of education: Not on file   Highest education level: Not on file  Occupational History   Not on file  Tobacco Use   Smoking status: Former    Packs/day: 1.50    Types: Cigarettes   Smokeless tobacco: Never  Vaping Use   Vaping Use: Never used  Substance and Sexual Activity   Alcohol use: Not Currently   Drug use: Not Currently   Sexual activity: Not on file  Other Topics Concern   Not on file  Social History Narrative   Not on file   Social Determinants of Health   Financial Resource Strain: Not on file  Food Insecurity: Not on file  Transportation Needs: Not on file  Physical Activity: Not on file  Stress: Not on file  Social Connections: Not on file   Past Surgical History:  Procedure Laterality Date   ESOPHAGOGASTRODUODENOSCOPY ENDOSCOPY N/A 03/18/2022   Procedure: ESOPHAGOGASTRODUODENOSCOPY ENDOSCOPY;  Surgeon: Diamantina Monks, MD;  Location: [facility] OR;  Service: General;  Laterality:  N/A;   IR REPLC GASTRO/COLONIC TUBE PERCUT W/FLUORO  03/24/2022   IR REPLC GASTRO/COLONIC TUBE PERCUT W/FLUORO  05/20/2022   PEG PLACEMENT N/A 03/18/2022   Procedure: LAPAROSCOPIC ASSITED PERCUTANEOUS GASTROSTOMY (PEG) PLACEMENT;  Surgeon: Diamantina Monks, MD;  Location: [facility] OR;  Service: General;  Laterality: N/A;   TRACHEOSTOMY TUBE PLACEMENT N/A 03/18/2022   Procedure: TRACHEOSTOMY;  Surgeon: Diamantina Monks, MD;  Location: [facility];  Service: General;  Laterality: N/A;   Past Medical History:  Diagnosis Date   C. difficile colitis    DVT (deep venous thrombosis) (HCC)    Hypertension    Iron deficiency    TBI (traumatic brain injury) (HCC)    BP  124/83   Pulse 85   Ht 6\' 6"  (1.981 m)   SpO2 93%   BMI 33.37 kg/m   Opioid Risk Score:   Fall Risk Score:  `1  Depression screen [facility] 2/9     11/18/2022    2:29 PM 09/18/2022   11:20 AM  Depression screen PHQ 2/9  Decreased Interest 0 0  Down, Depressed, Hopeless 0 0  PHQ - 2 Score 0 0  Altered sleeping  0  Tired, decreased energy  1  Change in appetite  0  Feeling bad or failure about yourself   0  Trouble concentrating  0  Moving slowly or fidgety/restless  0  Suicidal thoughts  0  PHQ-9 Score  1  Difficult doing work/chores  Not difficult at all    Review of Systems  HENT:  Positive for drooling.   Musculoskeletal:  Positive for gait problem.  Neurological:  Positive for tremors, speech difficulty and weakness.  Psychiatric/Behavioral:         Confusion        Objective:   Physical Exam  Gen: no distress, normal appearing HEENT: oral mucosa pink and moist, NCAT Cardio: Reg rate Chest: normal effort, normal rate of breathing Abd: soft, non-distended, PEG clean Ext: no edema Psych: pleasant, normal affect Skin: intact Neuro: oriented to month, year. Follows commands. Speech nasal and dysarthric. Loud voice. RUE 4/5. RLE 4/5 LUE and LLE nearly 5/5. Decreased FMC, apraxic RUE movement, non-fluid RUE movement. DTR's 3+. No resting tone. Sensation intact Musculoskeletal: Full ROM, No pain with AROM or PROM in the neck, trunk, or extremities. Posture appropriate        Assessment & Plan:  Severe TBI -continue therapies at [facility] He's made nice progress and is able to assist with transfers! Severe dysphagia--continue tastes of puree for pleasure, feeding thru PEG RLE DVT: eliquis until he's ambulatory Behavior/mood: Propranolol, celexa Trazodone for sleep--increase to 100mg  qhs 5.   Loose stool resolved with new formula isosource 6.  Seizure-like activity: continue keppra for now.   -decrease keppra to 500mg  at bedtime 7. Cognition/initiation  -continue  ritalin for now. Goal will be to wean soon 8. RUE:  -this really isn't spasticity. It's more dyskinesia/apraxia  -he's made a lot of progress, however  -encourage ongoing engagement of the right arm and leg in every day movement    Thirty minutes of face to face patient care time were spent during this visit. All questions were encouraged and answered. Follow up with me in 2 mos.

## 2022-11-20 ENCOUNTER — Telehealth: Payer: Self-pay | Admitting: *Deleted

## 2022-11-20 DIAGNOSIS — R1312 Dysphagia, oropharyngeal phase: Secondary | ICD-10-CM

## 2022-11-20 DIAGNOSIS — S069X3S Unspecified intracranial injury with loss of consciousness of 1 hour to 5 hours 59 minutes, sequela: Secondary | ICD-10-CM

## 2022-11-23 MED ORDER — METHYLPHENIDATE HCL 5 MG PO TABS: 15.0000 mg | ORAL_TABLET | Freq: Two times a day (BID) | ORAL | 0 refills | Status: DC

## 2022-11-23 NOTE — Telephone Encounter (Signed)
Hopefully he tolerates the osmolite.   [facility]  thx

## 2022-11-23 NOTE — Telephone Encounter (Signed)
Carl Carpenter (SO) called to ask for Isosource order be placed for his TF since he does not have the diarrhea with it like he did with the Jevity. I called [facility] and they were going to get back with me on that request. I received a call today (12/4/) from [facility] and they say that both Jevity and Isosource are on backorder.  They are going to have to send out Osmolite1.5 in substitution and they are to let us know if he likes that then we will need to send an order to stay on the osmolite.  Also she says that his methylphenidate is on back order indefinitely. He has 41 tablets which was [facility] [facility] supply[facility] [address] [facility]  [place] [facility]  Do you [facility] to [facility]?

## 2022-12-22 ENCOUNTER — Other Ambulatory Visit: Payer: Self-pay | Admitting: Physical Medicine & Rehabilitation

## 2023-01-06 ENCOUNTER — Telehealth: Payer: Self-pay | Admitting: Physical Medicine & Rehabilitation

## 2023-01-06 DIAGNOSIS — R1312 Dysphagia, oropharyngeal phase: Secondary | ICD-10-CM

## 2023-01-06 DIAGNOSIS — S069X3S Unspecified intracranial injury with loss of consciousness of 1 hour to 5 hours 59 minutes, sequela: Secondary | ICD-10-CM

## 2023-01-06 MED ORDER — METHYLPHENIDATE HCL 10 MG PO TABS: 10.0000 mg | ORAL_TABLET | Freq: Two times a day (BID) | ORAL | 0 refills | Status: DC

## 2023-01-06 NOTE — Telephone Encounter (Signed)
[facility] [place] [facility] no longer carries Ritalin.  Current pharmacy unable to transfer

## 2023-01-06 NOTE — Telephone Encounter (Signed)
Patient needs a new prescription for Ritalin 10mg . Insurance wouldn't approve the 5mg .  [facility] [place].  Any questions please call [name] @ [phone].

## 2023-01-06 NOTE — Telephone Encounter (Signed)
The script at [place] has been cancelled. They are currently out stock. Please [facility] [place].   Thank you.

## 2023-01-06 NOTE — Telephone Encounter (Signed)
To ease this pharmacy/insurance conundrum we're in, I changed his ritalin to just 10mg  bid.  thx

## 2023-01-06 NOTE — Telephone Encounter (Signed)
Rx sent 

## 2023-01-20 ENCOUNTER — Encounter: Payer: Medicaid Other | Admitting: Physical Medicine & Rehabilitation

## 2023-02-02 ENCOUNTER — Encounter: Payer: Self-pay | Admitting: Physical Medicine & Rehabilitation

## 2023-02-02 DIAGNOSIS — R1312 Dysphagia, oropharyngeal phase: Secondary | ICD-10-CM

## 2023-02-02 DIAGNOSIS — S069X3S Unspecified intracranial injury with loss of consciousness of 1 hour to 5 hours 59 minutes, sequela: Secondary | ICD-10-CM

## 2023-02-03 MED ORDER — METHYLPHENIDATE HCL 10 MG PO TABS: 10.0000 mg | ORAL_TABLET | Freq: Two times a day (BID) | ORAL | 0 refills | Status: DC

## 2023-02-03 NOTE — Telephone Encounter (Signed)
I sent rx'es for February and March

## 2023-02-04 ENCOUNTER — Telehealth: Payer: Self-pay | Admitting: *Deleted

## 2023-02-04 DIAGNOSIS — R1312 Dysphagia, oropharyngeal phase: Secondary | ICD-10-CM

## 2023-02-04 DIAGNOSIS — S069X3S Unspecified intracranial injury with loss of consciousness of 1 hour to 5 hours 59 minutes, sequela: Secondary | ICD-10-CM

## 2023-02-04 NOTE — Telephone Encounter (Signed)
Ritalin 10 mg Rx sent to [facility] will need to be re-sent to [facility] [place]. [facility] states medication on back order. This has been verified with the pharmacist.

## 2023-02-05 MED ORDER — METHYLPHENIDATE HCL 10 MG PO TABS: 10.0000 mg | ORAL_TABLET | Freq: Two times a day (BID) | ORAL | 0 refills | Status: DC

## 2023-02-05 NOTE — Telephone Encounter (Signed)
Rx's sent to [facility]

## 2023-02-08 NOTE — Telephone Encounter (Signed)
done 

## 2023-02-10 ENCOUNTER — Encounter: Payer: Self-pay | Admitting: Physical Medicine & Rehabilitation

## 2023-02-10 ENCOUNTER — Telehealth: Payer: Self-pay | Admitting: Physical Medicine & Rehabilitation

## 2023-02-10 ENCOUNTER — Encounter: Payer: Medicaid Other | Attending: Physical Medicine & Rehabilitation | Admitting: Physical Medicine & Rehabilitation

## 2023-02-10 VITALS — BP 116/77 | HR 80

## 2023-02-10 DIAGNOSIS — R1312 Dysphagia, oropharyngeal phase: Secondary | ICD-10-CM | POA: Diagnosis present

## 2023-02-10 DIAGNOSIS — R519 Headache, unspecified: Secondary | ICD-10-CM

## 2023-02-10 DIAGNOSIS — S069X3S Unspecified intracranial injury with loss of consciousness of 1 hour to 5 hours 59 minutes, sequela: Secondary | ICD-10-CM | POA: Insufficient documentation

## 2023-02-10 NOTE — Progress Notes (Signed)
Subjective:    Patient ID: Carl Carpenter, male    DOB: [birth year], 37 y.o.   MRN: [id number]  HPI  Carl Carpenter is here in follow up of his TBI. He is having general headaches. May be sinus related as he's had sinus symptoms. Sleep has been better but coughing will sometimes wake him up at night.  The increase of trazodone to 100 mg did help his sleep.  From a swallowing standpoint he is making some progress.  He is eating more pure and drinking pudding liquids.  Is more than just sips of taste now.  He is not able to eat enough yet to sustain from a nutritional standpoint.  He remains on tube feeds for nutrition and for medications.  His speech therapist in [facility] would like to perform another modified barium swallow to reassess.  His speech intelligibility is improving also.  Otherwise from a therapy standpoint he continues to be involved in physical therapy at [facility] through their pro bono program.  He has done some early walking.  His significant other showed me a video of him doing some steps.  He is unable to use the light gait as he is too tall.  His trunk control is improving.  He is not having any spasticity.  Mood has been positive.  Bowel bladder function has been intact.  He is having no further diarrhea since the change in tube feed formula.  We reduced Keppra at last visit to 500 mg at bedtime without any problems.  The patient asked if he could have sexual intercourse at this point.  Pain Inventory Average Pain 0 Pain Right Now 0 My pain is  n/a  LOCATION OF PAIN  n/a  BOWEL Number of stools per week: 7-14 Oral laxative use Yes  Type of laxative Miralax couple of day now normal Enema or suppository use No  History of colostomy No  Incontinent No   BLADDER Normal In and out cath, frequency n/a Able to self cath  n/a Bladder incontinence yes Frequent urination No  Leakage with coughing No  Difficulty starting stream No  Incomplete bladder emptying  No    Mobility ability to climb steps?  no do you drive?  no use a wheelchair needs help with transfers  Function disabled: date disabled .  Neuro/Psych bladder control problems trouble walking dizziness confusion  Prior Studies Any changes since last visit?  no  Physicians involved in your care Any changes since last visit?  no   History reviewed. No pertinent family history. Social History   Socioeconomic History   Marital status: Significant Other    Spouse name: Not on file   Number of children: Not on file   Years of education: Not on file   Highest education level: Not on file  Occupational History   Not on file  Tobacco Use   Smoking status: Former    Packs/day: 1.50    Types: Cigarettes   Smokeless tobacco: Never  Vaping Use   Vaping Use: Never used  Substance and Sexual Activity   Alcohol use: Not Currently   Drug use: Not Currently   Sexual activity: Not on file  Other Topics Concern   Not on file  Social History Narrative   Not on file   Social Determinants of Health   Financial Resource Strain: Not on file  Food Insecurity: Not on file  Transportation Needs: Not on file  Physical Activity: Not on file  Stress: Not on  file  Social Connections: Not on file   Past Surgical History:  Procedure Laterality Date   ESOPHAGOGASTRODUODENOSCOPY ENDOSCOPY N/A 03/18/2022   Procedure: ESOPHAGOGASTRODUODENOSCOPY ENDOSCOPY;  Surgeon: Jesusita Oka, MD;  Location: [place];  Service: General;  Laterality: N/A;   IR REPLC GASTRO/COLONIC TUBE PERCUT W/FLUORO  03/24/2022   IR REPLC GASTRO/COLONIC TUBE PERCUT W/FLUORO  05/20/2022   PEG PLACEMENT N/A 03/18/2022   Procedure: LAPAROSCOPIC ASSITED PERCUTANEOUS GASTROSTOMY (PEG) PLACEMENT;  Surgeon: Jesusita Oka, MD;  Location: [place];  Service: General;  Laterality: N/A;   TRACHEOSTOMY TUBE PLACEMENT N/A 03/18/2022   Procedure: TRACHEOSTOMY;  Surgeon: Jesusita Oka, MD;  Location: [place];  Service:  General;  Laterality: N/A;   Past Medical History:  Diagnosis Date   C. difficile colitis    DVT (deep venous thrombosis) ([place])    Hypertension    Iron deficiency    TBI (traumatic brain injury) ([place])    BP 116/77   Pulse 80   SpO2 95%   Opioid Risk Score:   Fall Risk Score:  `1  Depression screen [facility] 2/9     02/10/2023   11:48 AM 02/10/2023   11:44 AM 11/18/2022    2:29 PM 09/18/2022   11:20 AM  Depression screen PHQ 2/9  Decreased Interest 0 0 0 0  Down, Depressed, Hopeless 0 0 0 0  PHQ - 2 Score 0 0 0 0  Altered sleeping    0  Tired, decreased energy    1  Change in appetite    0  Feeling bad or failure about yourself     0  Trouble concentrating    0  Moving slowly or fidgety/restless    0  Suicidal thoughts    0  PHQ-9 Score    1  Difficult doing work/chores    Not difficult at all     Review of Systems  Respiratory:  Positive for cough.   All other systems reviewed and are negative.      Objective:   Physical Exam  General: No acute distress HEENT: NCAT, EOMI, oral membranes moist Cards: reg rate  Chest: normal effort Abdomen: Soft, NT, ND, PEG Skin: dry, intact Extremities: no edema Psych: pleasant and appropriate  Skin: intact Neuro: oriented to month, year. Follows commands.  Speech remains nasal and dysarthric but intelligibility has improved especially when he takes his time.. Loud voice. RUE 5/5. RLE 4-5/5 with some primitive pattern still present until he engages the extremity.  LUE and LLE nearly 5/5. Decreased FMC,  DTR's 3+. No resting tone. Sensation intact Musculoskeletal: Full ROM, No pain with AROM or PROM in the neck, trunk, or extremities.  Improved sitting posture.        Assessment & Plan:  Severe TBI -continue therapies at [facility] He continues to progress. Wife asked about a transport wheelchair.  I gave her prescription today that perhaps they can use for a discount.  His trunk control has improved enough for this is adequate when  they leave the house.  They also may want to check with physical therapy Severe dysphagia--pureed and pudding liquids currently - meds and TF thru  PEG still -f/u MBS per SLP in [facility]  . RLE DVT: eliquis until he's ambulatory Behavior/mood: Propranolol, celexa Trazodone 14m qhs Scheduled Mucinex and antihistamine to help with cough at bedtime. 5.   Loose stool resolved with new formula isosource 6.  Seizure-like activity: continue keppra for now.              -  decrease keppra to 232m at bedtime for a month then off 7. Cognition/initiation             -continue ritalin for now at 133m Goal will be to wean soon 8. RUE:             - isn't spasticity. It's more dyskinesia/apraxia             -ongoing progress     20+ minutes of face to face patient care time were spent during this visit. All questions were encouraged and answered.  Also wrote patient a letter to help them with assistance at home.  Follow up with me in 3 mos.

## 2023-02-10 NOTE — Patient Instructions (Addendum)
ALWAYS FEEL FREE TO CALL OUR OFFICE WITH ANY PROBLEMS OR QUESTIONS [phone]  **PLEASE NOTE** ALL MEDICATION REFILL REQUESTS (INCLUDING CONTROLLED SUBSTANCES) NEED TO BE MADE AT LEAST 7 DAYS PRIOR TO REFILL BEING DUE. ANY REFILL REQUESTS INSIDE THAT TIME FRAME MAY RESULT IN DELAYS IN RECEIVING YOUR PRESCRIPTION.    REDUCE KEPPRA TO 250MG AT NIGHT FOR ONE MONTH, THEN STOP.

## 2023-02-10 NOTE — Telephone Encounter (Signed)
Order faxed to [place] @ [phone] for swallow function for oropharyngeal dysphagia.

## 2023-03-19 ENCOUNTER — Other Ambulatory Visit: Payer: Self-pay | Admitting: Physical Medicine & Rehabilitation

## 2023-03-19 DIAGNOSIS — R1312 Dysphagia, oropharyngeal phase: Secondary | ICD-10-CM

## 2023-03-19 DIAGNOSIS — S069X3S Unspecified intracranial injury with loss of consciousness of 1 hour to 5 hours 59 minutes, sequela: Secondary | ICD-10-CM

## 2023-03-22 ENCOUNTER — Other Ambulatory Visit: Payer: Self-pay | Admitting: Physical Medicine & Rehabilitation

## 2023-03-29 ENCOUNTER — Telehealth: Payer: Self-pay | Admitting: *Deleted

## 2023-03-29 NOTE — Telephone Encounter (Signed)
Fax received and placed on [facility] desk for signature D/C order Keppra 500 mg.

## 2023-04-05 ENCOUNTER — Other Ambulatory Visit: Payer: Self-pay | Admitting: Physical Medicine & Rehabilitation

## 2023-04-05 DIAGNOSIS — R1312 Dysphagia, oropharyngeal phase: Secondary | ICD-10-CM

## 2023-04-05 DIAGNOSIS — S069X3S Unspecified intracranial injury with loss of consciousness of 1 hour to 5 hours 59 minutes, sequela: Secondary | ICD-10-CM

## 2023-04-05 NOTE — Telephone Encounter (Signed)
Last fill per pmp   Filled  Written  ID  Drug  QTY  Days  Prescriber  RX #  Dispenser  Refill  Daily Dose*  Pymt Type  PMP  03/09/2023 02/05/2023 1  Methylphenidate 10 Mg Tablet 60.00 30 Za Swa [id number] Nor ([id number]) 0/0  Medicaid [place]

## 2023-04-06 MED ORDER — METHYLPHENIDATE HCL 10 MG PO TABS: 10.0000 mg | ORAL_TABLET | Freq: Two times a day (BID) | ORAL | 0 refills | Status: DC

## 2023-04-06 NOTE — Telephone Encounter (Signed)
Ritalin rf'ed for 2 mos

## 2023-04-30 ENCOUNTER — Other Ambulatory Visit: Payer: Self-pay

## 2023-05-05 ENCOUNTER — Emergency Department (HOSPITAL_COMMUNITY)
Admission: EM | Admit: 2023-05-05 | Discharge: 2023-05-05 | Disposition: A | Discharge status: Home / Self Care | Payer: Medicaid Other | Attending: Emergency Medicine | Admitting: Emergency Medicine

## 2023-05-05 ENCOUNTER — Encounter (HOSPITAL_COMMUNITY): Payer: Self-pay

## 2023-05-05 ENCOUNTER — Emergency Department (HOSPITAL_COMMUNITY): Payer: Medicaid Other

## 2023-05-05 ENCOUNTER — Other Ambulatory Visit: Payer: Self-pay

## 2023-05-05 DIAGNOSIS — I10 Essential (primary) hypertension: Secondary | ICD-10-CM | POA: Diagnosis not present

## 2023-05-05 DIAGNOSIS — K9423 Gastrostomy malfunction: Secondary | ICD-10-CM | POA: Insufficient documentation

## 2023-05-05 HISTORY — PX: IR REPLACE G-TUBE SIMPLE WO FLUORO: IMG2323

## 2023-05-05 NOTE — Discharge Instructions (Signed)
Please follow-up with your primary care physician as needed. °

## 2023-05-05 NOTE — ED Triage Notes (Addendum)
Pt arrives via POV with Spouse. Spouse states his Peg tube has been leaking since being changed in March. No other complaints at this time.
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
## 2023-05-05 NOTE — ED Notes (Signed)
Pt is a&o to his baseline. Wife states that since having his PEG tube changed in March it has been leaking/"shooting" out of top. No leaking noted at insertion site. PEG tube and skin all look great. Wife obviously takes great care of him. Pt has been attached to vitals. Side rails up x 2, wife at the bedside. No other complaints reported. No pain at site.

## 2023-05-19 ENCOUNTER — Encounter: Payer: Self-pay | Admitting: Physical Medicine & Rehabilitation

## 2023-05-19 ENCOUNTER — Encounter: Payer: Medicaid Other | Attending: Physical Medicine & Rehabilitation | Admitting: Physical Medicine & Rehabilitation

## 2023-05-19 VITALS — BP 115/76 | HR 79 | Ht 78.0 in | Wt 287.0 lb

## 2023-05-19 DIAGNOSIS — S069X3D Unspecified intracranial injury with loss of consciousness of 1 hour to 5 hours 59 minutes, subsequent encounter: Secondary | ICD-10-CM | POA: Insufficient documentation

## 2023-05-19 DIAGNOSIS — R1314 Dysphagia, pharyngoesophageal phase: Secondary | ICD-10-CM | POA: Diagnosis not present

## 2023-05-19 DIAGNOSIS — S069X3S Unspecified intracranial injury with loss of consciousness of 1 hour to 5 hours 59 minutes, sequela: Secondary | ICD-10-CM | POA: Diagnosis present

## 2023-05-19 NOTE — Progress Notes (Signed)
Subjective:    Patient ID: Carl Carpenter, male    DOB: 02/07/1986, 37 y.o.   MRN: [id number]  HPI  Carl Carpenter is here in follow up of his TBI. He is still getting therapies through [facility] and is receiving therapy 1-2 days per week between 2 different programs. He says he's walking quite a bit with them with the focus on balance and trunk control. He remains unable to walk at home with family assistance due to his poor motor control. However his motor control is improving.   From a swallowing standpoint he remains on pureed solids/ putting liquids.  They have been able to back off the TF as a result. Water thru tube hasn't changed. There have been some problems with the G=tube and they've had 2 replacements since I last saw them d/t leaks.   He denies pain other than having a toothache. He needs to see a dentist.   Mood has remained positive. Sleep is inconsistent. He's not sure what wakes him. He remains on trazodone. He says he only gets about 2 hours of sleep. His S.O. tells me he's an early riser.   He is off keppra without issue. He remains on ritalin for attention and initiation.    Pain Inventory Average Pain  no pain Pain Right Now  no pain My pain is intermittent  LOCATION OF PAIN  Headache  BOWEL Number of stools per week: 7 Oral laxative use No    BLADDER Pads  Bladder incontinence Yes     Mobility use a wheelchair needs help with transfers Do you have any goals in this area?  yes  Function disabled: date disabled 02/2022 I need assistance with the following:  feeding, dressing, bathing, toileting, meal prep, household duties, and shopping Do you have any goals in this area?  yes  Neuro/Psych bladder control problems weakness numbness tremor trouble walking spasms  Prior Studies Any changes since last visit?  yes Peg tube replaced  Physicians involved in your care Any changes since last visit?  no   History reviewed. No pertinent family  history. Social History   Socioeconomic History   Marital status: Significant Other    Spouse name: Not on file   Number of children: Not on file   Years of education: Not on file   Highest education level: Not on file  Occupational History   Not on file  Tobacco Use   Smoking status: Former    Packs/day: 1.5    Types: Cigarettes   Smokeless tobacco: Never  Vaping Use   Vaping Use: Never used  Substance and Sexual Activity   Alcohol use: Not Currently   Drug use: Not Currently   Sexual activity: Not on file  Other Topics Concern   Not on file  Social History Narrative   Not on file   Social Determinants of Health   Financial Resource Strain: Not on file  Food Insecurity: Not on file  Transportation Needs: Not on file  Physical Activity: Not on file  Stress: Not on file  Social Connections: Not on file   Past Surgical History:  Procedure Laterality Date   ESOPHAGOGASTRODUODENOSCOPY ENDOSCOPY N/A 03/18/2022   Procedure: ESOPHAGOGASTRODUODENOSCOPY ENDOSCOPY;  Surgeon: Diamantina Monks, MD;  Location: [facility] OR;  Service: General;  Laterality: N/A;   IR REPLACE G-TUBE SIMPLE WO FLUORO  05/05/2023   IR REPLC GASTRO/COLONIC TUBE PERCUT W/FLUORO  03/24/2022   IR REPLC GASTRO/COLONIC TUBE PERCUT W/FLUORO  05/20/2022   PEG PLACEMENT N/A  03/18/2022   Procedure: LAPAROSCOPIC ASSITED PERCUTANEOUS GASTROSTOMY (PEG) PLACEMENT;  Surgeon: Diamantina Monks, MD;  Location: [facility] OR;  Service: General;  Laterality: N/A;   TRACHEOSTOMY TUBE PLACEMENT N/A 03/18/2022   Procedure: TRACHEOSTOMY;  Surgeon: Diamantina Monks, MD;  Location: [facility] OR;  Service: General;  Laterality: N/A;   Past Medical History:  Diagnosis Date   C. difficile colitis    DVT (deep venous thrombosis) (HCC)    Hypertension    Iron deficiency    TBI (traumatic brain injury) (HCC)    BP 115/76   Pulse 79   Ht 6\' 6"  (1.981 m)   Wt 287 lb (130.2 kg)   BMI 33.17 kg/m   Opioid Risk Score:   Fall Risk Score:   `1  Depression screen [facility] 2/9     05/19/2023   11:14 AM 02/10/2023   11:48 AM 02/10/2023   11:44 AM 11/18/2022    2:29 PM 09/18/2022   11:20 AM  Depression screen PHQ 2/9  Decreased Interest 0 0 0 0 0  Down, Depressed, Hopeless 0 0 0 0 0  PHQ - 2 Score 0 0 0 0 0  Altered sleeping     0  Tired, decreased energy     1  Change in appetite     0  Feeling bad or failure about yourself      0  Trouble concentrating     0  Moving slowly or fidgety/restless     0  Suicidal thoughts     0  PHQ-9 Score     1  Difficult doing work/chores     Not difficult at all    Review of Systems  Genitourinary:        Incontinence   Musculoskeletal:  Positive for gait problem.  Neurological:  Positive for tremors, weakness and headaches.       Spasms  All other systems reviewed and are negative.     Objective:   Physical Exam General: No acute distress HEENT: NCAT, EOMI, oral membranes moist Cards: reg rate  Chest: normal effort Abdomen: Soft, NT, ND, PEG Skin: dry, intact Extremities: no edema Psych: pleasant and appropriate  Skin: intact Neuro: oriented to month, year. Follows commands.  Speech dysarthric and nasal withLoud voice. RUE 5/5. RLE 4-5/5 with impaired but improving FMC. Still ataxic and can past-point. Tends to do things at times without looking either.  LUE and LLE nearly 5/5. Decreased FMC as well,  DTR's 3+. No resting tone. Sensation intact Musculoskeletal: Full ROM, No pain with AROM or PROM in the neck, trunk, or extremities.  Good sitting posture.        Assessment & Plan:  Severe TBI -continue therapies at [facility] He continues to make progress. He will need ongoing, repetitious work to improve coordination of movement. Vision/depth perception is also an issue. Transport chair Severe dysphagia--pureed and pudding liquids currently - meds and TF thru  PEG still  -may still be several months of PEG need given his impaired coordination of swallowing  . RLE DVT: eliquis  until he's ambulatory Behavior/mood/sleep: Propranolol, celexa Trazodone 100mg  qhs Continue Mucinex and antihistamine to help with cough at bedtime. [employer] Asked them to track his sleep habits a bit more closely to see if we can find some common issues 5.   Loose stool resolved with new formula isosource 6.  Seizure-like activity: continue keppra for now.              -now off  keppra 7. Cognition/initiation             -will see if we can wean off ritalin completely 8. Dyskinesia/apraxia             -ongoing progress. See abovfe  9. OPHTHALMOLOGY  FOLLOW UP FOR EYES. LATER THIS SUMMER.     25 minutes of face to face patient care time were spent during this visit. All questions were encouraged and answered.  Also wrote patient a letter to help them with assistance at home.  Follow up with me in 3 mos.

## 2023-05-19 NOTE — Patient Instructions (Signed)
RITALIN DECREASE TO ONCE DAILY FOR 2 WEEKS. SEE HOW HE DOES. IF YOU FIND THAT HE'S LESS ATTENTIVE OR MORE LETHARGIC, THEN RESUME TWICE DAILY   IF HE DOES WELL AFTER 2 WEEKS, THEN STOP THE RITALIN COMPLETELY. I WOULD WAIT UNTIL HE HAS A FEW DAYS BEFORE THERAPY TO DO THIS.

## 2023-07-19 ENCOUNTER — Other Ambulatory Visit: Payer: Self-pay | Admitting: Physical Medicine & Rehabilitation

## 2023-07-21 ENCOUNTER — Encounter: Payer: Self-pay | Admitting: Physical Medicine & Rehabilitation

## 2023-07-21 ENCOUNTER — Encounter: Payer: Medicaid Other | Attending: Physical Medicine & Rehabilitation | Admitting: Physical Medicine & Rehabilitation

## 2023-07-21 VITALS — BP 107/75 | HR 84 | Ht 78.0 in

## 2023-07-21 DIAGNOSIS — Z5181 Encounter for therapeutic drug level monitoring: Secondary | ICD-10-CM | POA: Insufficient documentation

## 2023-07-21 DIAGNOSIS — Z86718 Personal history of other venous thrombosis and embolism: Secondary | ICD-10-CM | POA: Insufficient documentation

## 2023-07-21 DIAGNOSIS — R531 Weakness: Secondary | ICD-10-CM | POA: Insufficient documentation

## 2023-07-21 DIAGNOSIS — R482 Apraxia: Secondary | ICD-10-CM | POA: Insufficient documentation

## 2023-07-21 DIAGNOSIS — R1314 Dysphagia, pharyngoesophageal phase: Secondary | ICD-10-CM | POA: Diagnosis not present

## 2023-07-21 DIAGNOSIS — S062X9D Diffuse traumatic brain injury with loss of consciousness of unspecified duration, subsequent encounter: Secondary | ICD-10-CM

## 2023-07-21 DIAGNOSIS — R45851 Suicidal ideations: Secondary | ICD-10-CM | POA: Diagnosis not present

## 2023-07-21 DIAGNOSIS — Z87891 Personal history of nicotine dependence: Secondary | ICD-10-CM | POA: Insufficient documentation

## 2023-07-21 DIAGNOSIS — X58XXXS Exposure to other specified factors, sequela: Secondary | ICD-10-CM | POA: Insufficient documentation

## 2023-07-21 DIAGNOSIS — R1310 Dysphagia, unspecified: Secondary | ICD-10-CM | POA: Diagnosis not present

## 2023-07-21 DIAGNOSIS — I82401 Acute embolism and thrombosis of unspecified deep veins of right lower extremity: Secondary | ICD-10-CM

## 2023-07-21 DIAGNOSIS — Z79899 Other long term (current) drug therapy: Secondary | ICD-10-CM | POA: Insufficient documentation

## 2023-07-21 DIAGNOSIS — S069X3D Unspecified intracranial injury with loss of consciousness of 1 hour to 5 hours 59 minutes, subsequent encounter: Secondary | ICD-10-CM | POA: Insufficient documentation

## 2023-07-21 DIAGNOSIS — F32A Depression, unspecified: Secondary | ICD-10-CM | POA: Diagnosis not present

## 2023-07-21 DIAGNOSIS — Z7901 Long term (current) use of anticoagulants: Secondary | ICD-10-CM | POA: Insufficient documentation

## 2023-07-21 DIAGNOSIS — R1312 Dysphagia, oropharyngeal phase: Secondary | ICD-10-CM | POA: Insufficient documentation

## 2023-07-21 DIAGNOSIS — S069X3S Unspecified intracranial injury with loss of consciousness of 1 hour to 5 hours 59 minutes, sequela: Secondary | ICD-10-CM | POA: Diagnosis present

## 2023-07-21 DIAGNOSIS — R262 Difficulty in walking, not elsewhere classified: Secondary | ICD-10-CM | POA: Insufficient documentation

## 2023-07-21 DIAGNOSIS — G249 Dystonia, unspecified: Secondary | ICD-10-CM

## 2023-07-21 DIAGNOSIS — G894 Chronic pain syndrome: Secondary | ICD-10-CM | POA: Diagnosis present

## 2023-07-21 DIAGNOSIS — F329 Major depressive disorder, single episode, unspecified: Secondary | ICD-10-CM | POA: Insufficient documentation

## 2023-07-21 DIAGNOSIS — R561 Post traumatic seizures: Secondary | ICD-10-CM

## 2023-07-21 MED ORDER — TRAZODONE HCL 100 MG PO TABS: 150.0000 mg | ORAL_TABLET | Freq: Every day | ORAL | 4 refills | Status: DC

## 2023-07-21 MED ORDER — CITALOPRAM HYDROBROMIDE 20 MG PO TABS: 20.0000 mg | ORAL_TABLET | Freq: Every day | ORAL | 4 refills | Status: DC

## 2023-07-21 NOTE — Patient Instructions (Signed)
ALWAYS FEEL FREE TO CALL OUR OFFICE WITH ANY PROBLEMS OR QUESTIONS [phone])  **PLEASE NOTE** ALL MEDICATION REFILL REQUESTS (INCLUDING CONTROLLED SUBSTANCES) NEED TO BE MADE AT LEAST 7 DAYS PRIOR TO REFILL BEING DUE. ANY REFILL REQUESTS INSIDE THAT TIME FRAME MAY RESULT IN DELAYS IN RECEIVING YOUR PRESCRIPTION.                   Take
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
## 2023-10-20 ENCOUNTER — Telehealth: Payer: Self-pay

## 2023-10-20 ENCOUNTER — Encounter: Payer: Self-pay | Admitting: Physical Medicine & Rehabilitation

## 2023-10-20 ENCOUNTER — Encounter: Payer: Medicaid Other | Attending: Physical Medicine & Rehabilitation | Admitting: Physical Medicine & Rehabilitation

## 2023-10-20 VITALS — BP 109/74 | HR 79 | Ht 78.0 in

## 2023-10-20 DIAGNOSIS — R1314 Dysphagia, pharyngoesophageal phase: Secondary | ICD-10-CM | POA: Insufficient documentation

## 2023-10-20 DIAGNOSIS — G479 Sleep disorder, unspecified: Secondary | ICD-10-CM | POA: Diagnosis not present

## 2023-10-20 DIAGNOSIS — R251 Tremor, unspecified: Secondary | ICD-10-CM | POA: Diagnosis present

## 2023-10-20 DIAGNOSIS — G969 Disorder of central nervous system, unspecified: Secondary | ICD-10-CM | POA: Diagnosis not present

## 2023-10-20 DIAGNOSIS — S069X3S Unspecified intracranial injury with loss of consciousness of 1 hour to 5 hours 59 minutes, sequela: Secondary | ICD-10-CM | POA: Insufficient documentation

## 2023-10-20 MED ORDER — CITALOPRAM HYDROBROMIDE 40 MG PO TABS: 40.0000 mg | ORAL_TABLET | Freq: Every day | ORAL | 5 refills | Status: DC

## 2023-10-20 MED ORDER — QUETIAPINE FUMARATE 25 MG PO TABS: 25.0000 mg | ORAL_TABLET | Freq: Every day | ORAL | 5 refills | Status: DC

## 2023-10-20 NOTE — Telephone Encounter (Signed)
Approved today by [facility] 2017 NCPDP Request Reference Number: [id number]. QUETIAPINE TAB 25MG  is approved through 44/02/4741.

## 2023-10-20 NOTE — Telephone Encounter (Signed)
PA submitted for Quetiapine PA Case ID #: [id number]

## 2023-10-20 NOTE — Patient Instructions (Addendum)
ALWAYS FEEL FREE TO CALL OUR OFFICE WITH ANY PROBLEMS OR QUESTIONS [phone])  **PLEASE NOTE** ALL MEDICATION REFILL REQUESTS (INCLUDING CONTROLLED SUBSTANCES) NEED TO BE MADE AT LEAST 7 DAYS PRIOR TO REFILL BEING DUE. ANY REFILL REQUESTS INSIDE THAT TIME FRAME MAY RESULT IN DELAYS IN RECEIVING YOUR PRESCRIPTION.   [place] [place]  [address] [place]  [employer] SECOND TUESDAY OF MONTH AT 4PM    STOP TRAZODONE
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
## 2023-11-05 ENCOUNTER — Encounter: Payer: Self-pay | Admitting: Physical Medicine & Rehabilitation

## 2023-11-05 DIAGNOSIS — S069X1D Unspecified intracranial injury with loss of consciousness of 30 minutes or less, subsequent encounter: Secondary | ICD-10-CM

## 2023-11-05 DIAGNOSIS — R1312 Dysphagia, oropharyngeal phase: Secondary | ICD-10-CM

## 2023-11-05 DIAGNOSIS — R1314 Dysphagia, pharyngoesophageal phase: Secondary | ICD-10-CM

## 2023-11-05 NOTE — Telephone Encounter (Signed)
Can you guys remind me Tuesday, and I'll put an order in so that it can be faxed.  Thanks!

## 2023-11-09 NOTE — Telephone Encounter (Signed)
RX written and in Epic. Thanks for the reminder!

## 2023-11-09 NOTE — Telephone Encounter (Signed)
Please don't forget to put in order--Reminder for you

## 2023-11-15 ENCOUNTER — Other Ambulatory Visit: Payer: Self-pay | Admitting: Physical Medicine & Rehabilitation

## 2023-11-24 IMAGING — DX DG ABD PORTABLE 1V
1 series · 2 of 2 positions shown · non-contrast
Comparison: 05/19/2022

CLINICAL DATA: Check percutaneous gastrostomy tube

EXAM:
PORTABLE ABDOMEN - 1 VIEW

[Series 1: abdomen · 0.14mm/px · 2 of 2 slices shown]
[im 1/2]
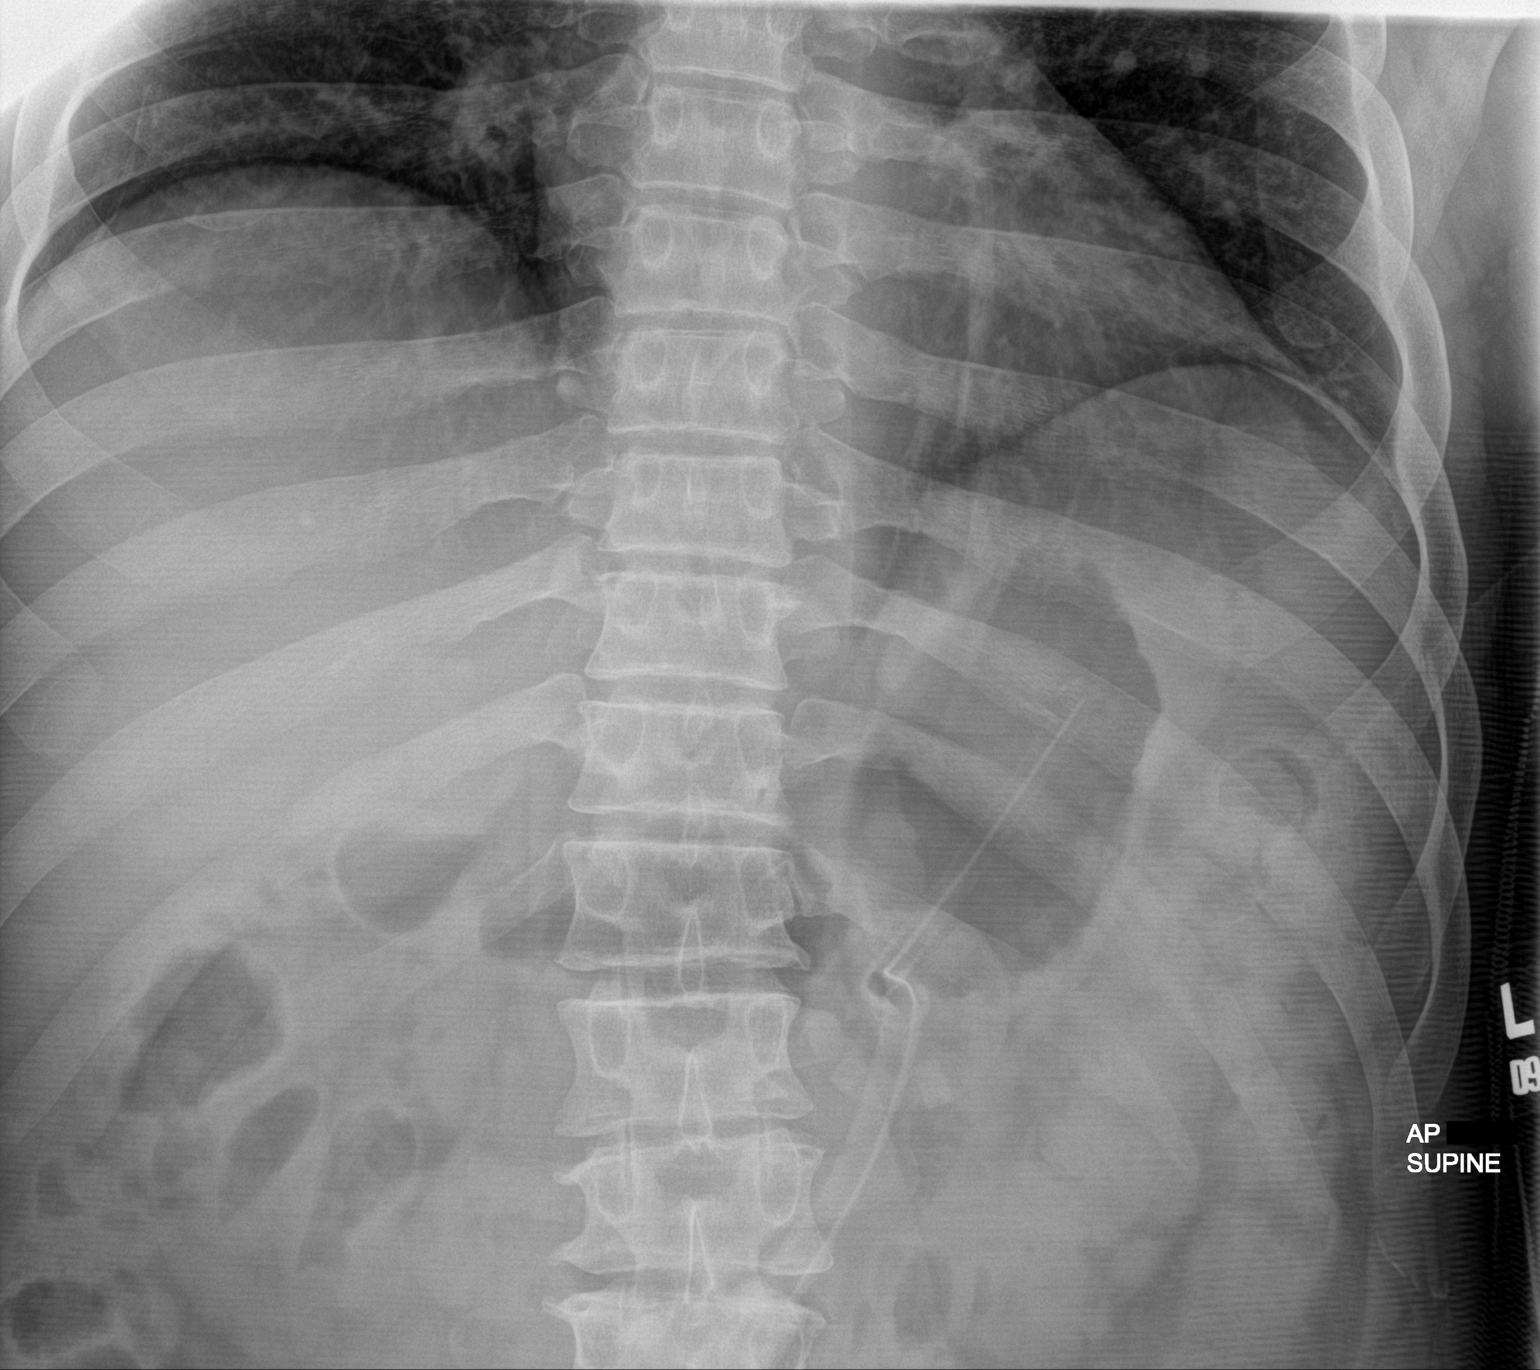
[im 2/2]
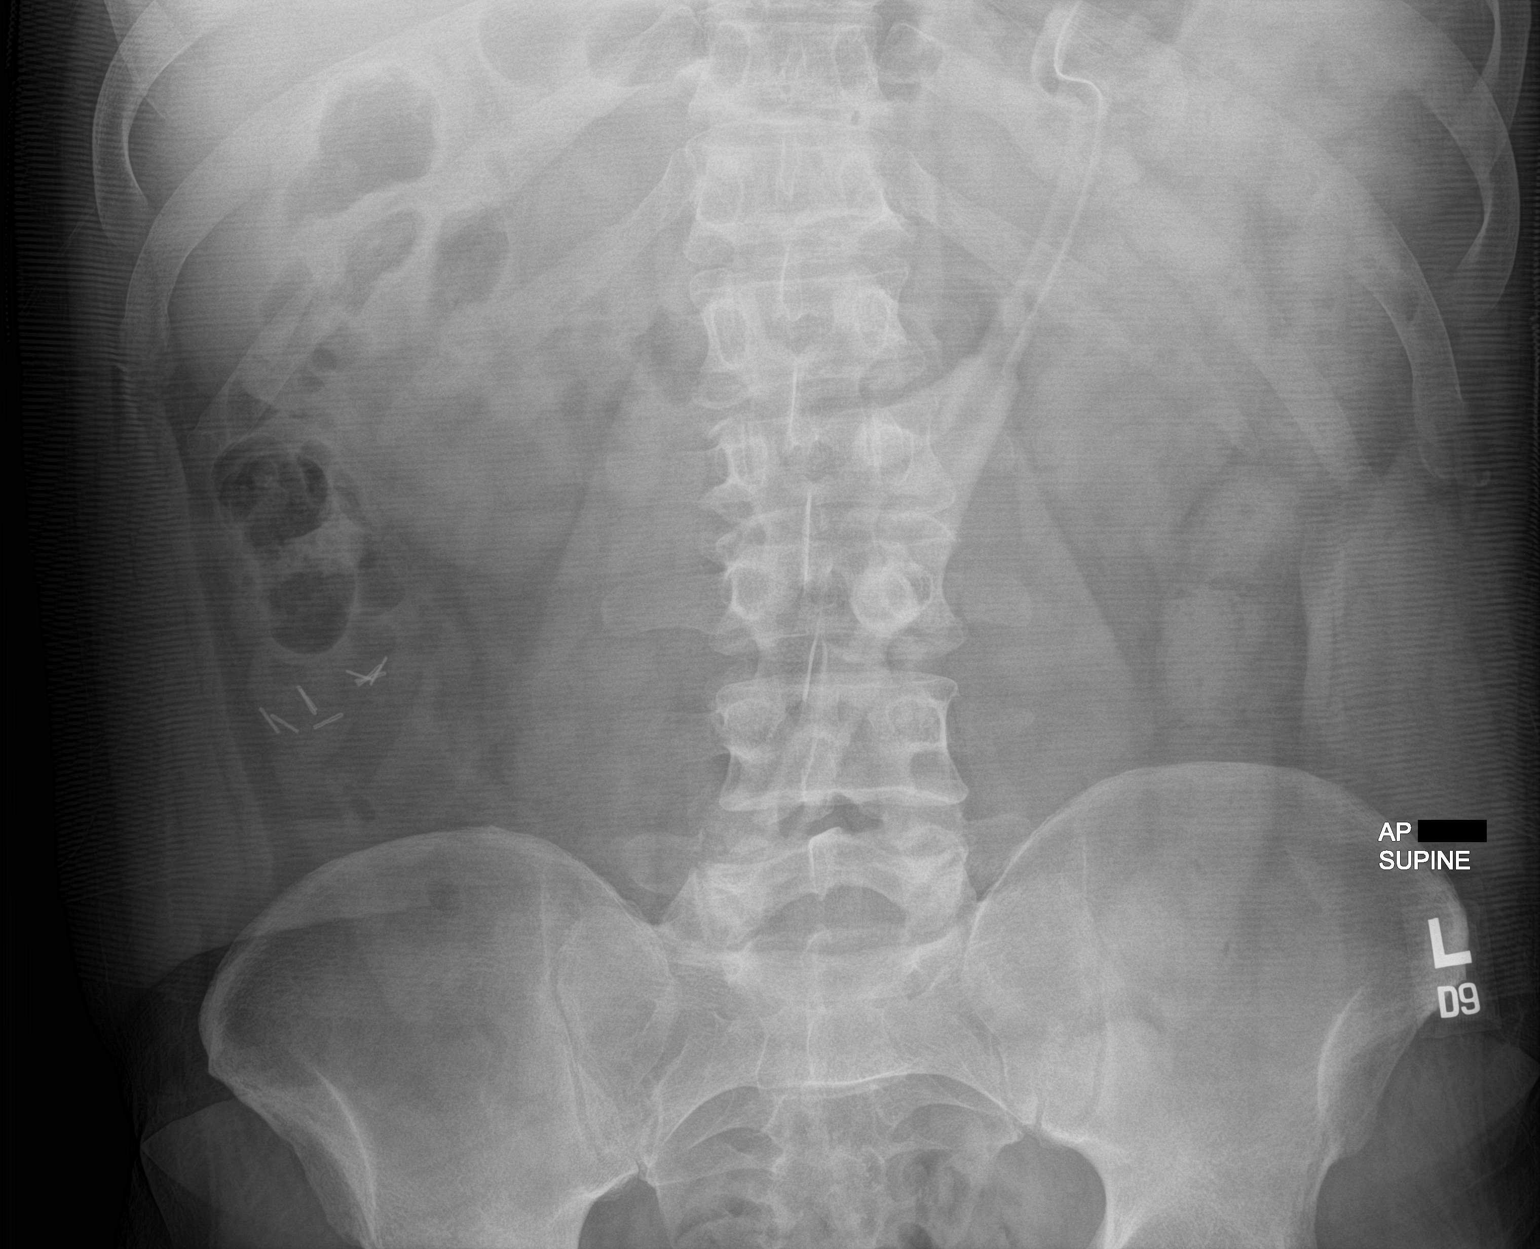

[2 of 2 positions shown; findings below may reference images not displayed]

FINDINGS: Tip percutaneous gastrostomy tube is seen in the fundus of the
stomach. Bowel gas pattern is nonspecific. Small amount of stool is
seen in the colon. Surgical clips are noted in the right lower
quadrant. Visualized lower lung fields are clear. Degenerative
changes are noted in the lumbar spine.
IMPRESSION: Tip of gastrostomy tube is seen in the region of fundus of the
stomach. Nonspecific bowel gas pattern.

## 2023-11-29 ENCOUNTER — Encounter: Payer: Self-pay | Admitting: Physical Medicine & Rehabilitation

## 2023-11-29 DIAGNOSIS — R1314 Dysphagia, pharyngoesophageal phase: Secondary | ICD-10-CM

## 2023-11-29 DIAGNOSIS — S069X3S Unspecified intracranial injury with loss of consciousness of 1 hour to 5 hours 59 minutes, sequela: Secondary | ICD-10-CM

## 2023-11-29 NOTE — Telephone Encounter (Signed)
Added SLP part of the order per their request. I had only ordered the MBS.

## 2023-11-29 NOTE — Telephone Encounter (Signed)
Orders are in for [facility] and therapy at [facility] in [facility]. Please let Mrs. Smith know.    Thanks!

## 2023-12-02 IMAGING — DX DG CHEST 1V PORT
1 series · 1 of 1 positions shown · non-contrast
Comparison: Chest 04/15/2022

CLINICAL DATA: Cough

EXAM:
PORTABLE CHEST 1 VIEW

[chest ap]
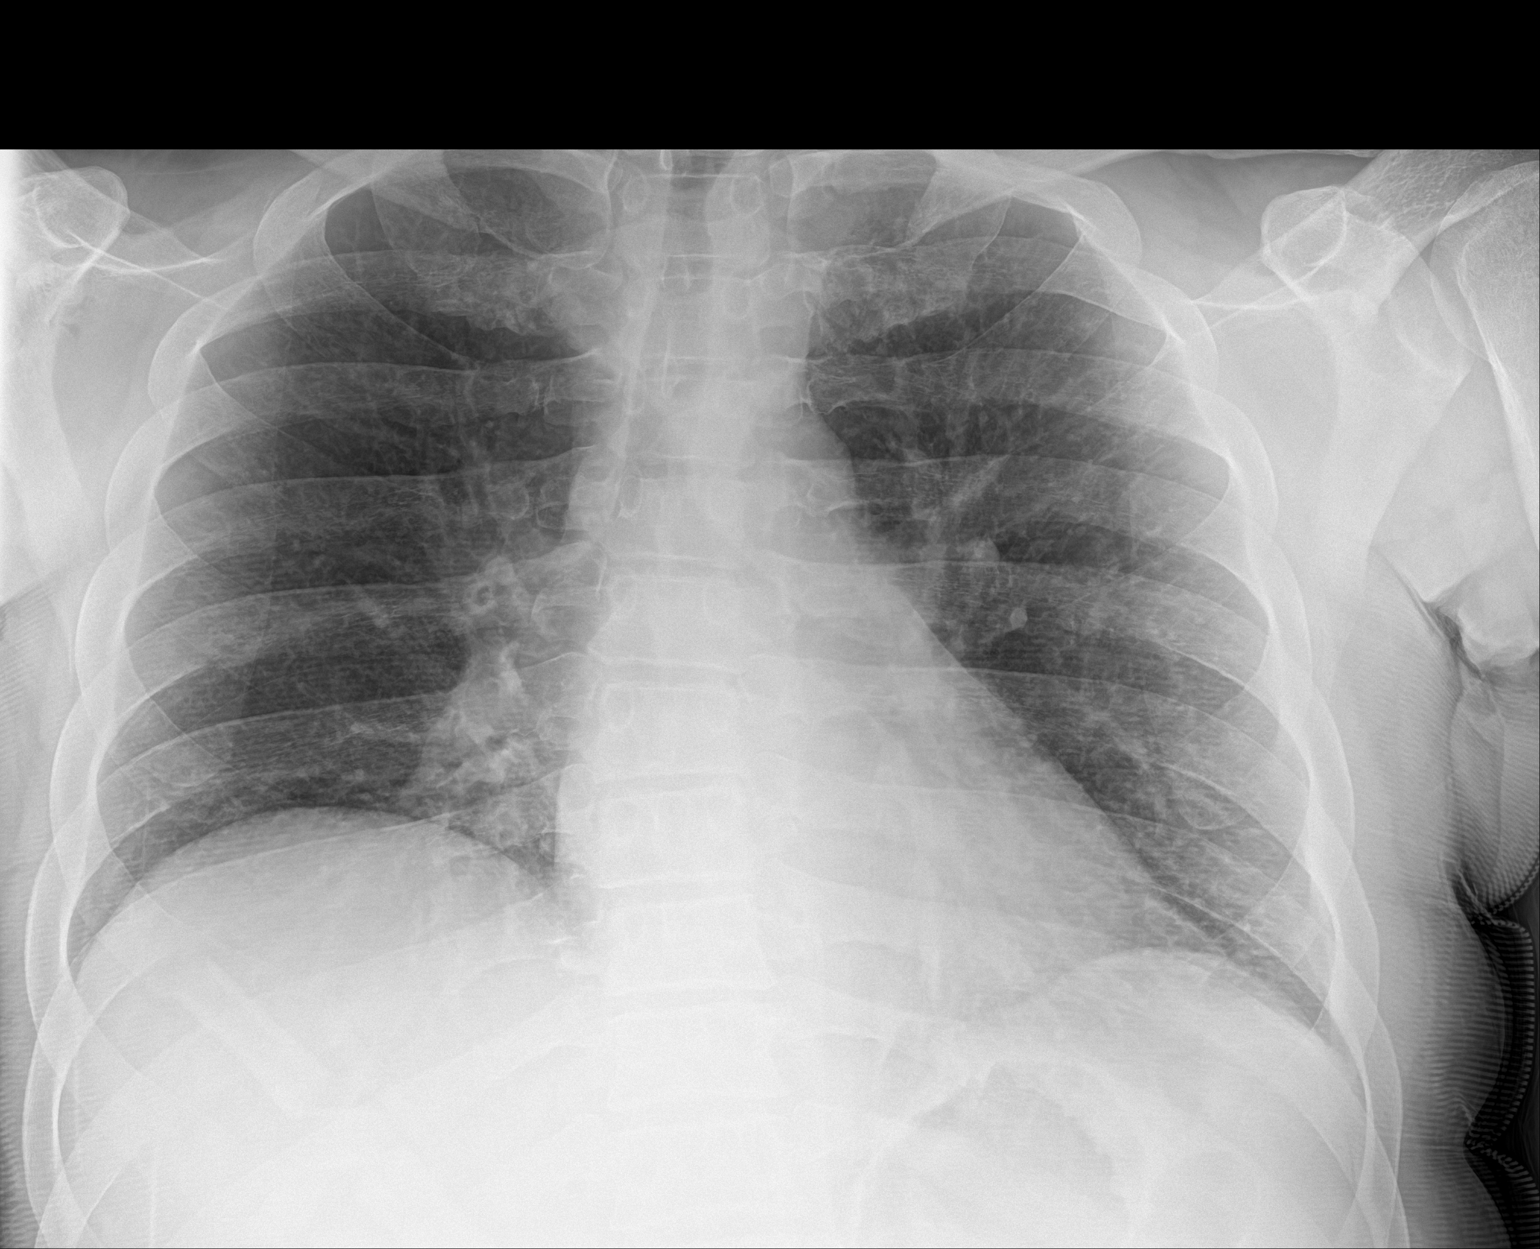

[1 of 1 positions shown; findings below may reference images not displayed]

FINDINGS: Interval removal of tracheostomy.  Mild subglottic stenosis.

Heart size and vascularity normal. Lungs clear without infiltrate or
effusion. Improved aeration compared to the prior study.
IMPRESSION: Interval removal of tracheostomy with mild subglottic stenosis of
the trachea

Interval clearing of bilateral airspace disease. Lungs are now
clear.

## 2024-01-19 ENCOUNTER — Encounter: Payer: Medicaid Other | Attending: Physical Medicine & Rehabilitation | Admitting: Physical Medicine & Rehabilitation

## 2024-01-19 ENCOUNTER — Encounter: Payer: Self-pay | Admitting: Physical Medicine & Rehabilitation

## 2024-01-19 ENCOUNTER — Telehealth: Payer: Self-pay | Admitting: Physical Medicine & Rehabilitation

## 2024-01-19 VITALS — BP 114/76 | HR 84 | Ht 78.0 in | Wt 294.0 lb

## 2024-01-19 DIAGNOSIS — G249 Dystonia, unspecified: Secondary | ICD-10-CM | POA: Insufficient documentation

## 2024-01-19 DIAGNOSIS — G969 Disorder of central nervous system, unspecified: Secondary | ICD-10-CM

## 2024-01-19 DIAGNOSIS — X58XXXD Exposure to other specified factors, subsequent encounter: Secondary | ICD-10-CM | POA: Diagnosis not present

## 2024-01-19 DIAGNOSIS — S069X3S Unspecified intracranial injury with loss of consciousness of 1 hour to 5 hours 59 minutes, sequela: Secondary | ICD-10-CM | POA: Diagnosis present

## 2024-01-19 DIAGNOSIS — R482 Apraxia: Secondary | ICD-10-CM | POA: Diagnosis not present

## 2024-01-19 DIAGNOSIS — G479 Sleep disorder, unspecified: Secondary | ICD-10-CM | POA: Diagnosis present

## 2024-01-19 DIAGNOSIS — Z7901 Long term (current) use of anticoagulants: Secondary | ICD-10-CM | POA: Insufficient documentation

## 2024-01-19 DIAGNOSIS — Z79899 Other long term (current) drug therapy: Secondary | ICD-10-CM | POA: Diagnosis not present

## 2024-01-19 DIAGNOSIS — S069X3D Unspecified intracranial injury with loss of consciousness of 1 hour to 5 hours 59 minutes, subsequent encounter: Secondary | ICD-10-CM

## 2024-01-19 DIAGNOSIS — R1310 Dysphagia, unspecified: Secondary | ICD-10-CM | POA: Insufficient documentation

## 2024-01-19 DIAGNOSIS — R2689 Other abnormalities of gait and mobility: Secondary | ICD-10-CM | POA: Diagnosis not present

## 2024-01-19 DIAGNOSIS — Z86718 Personal history of other venous thrombosis and embolism: Secondary | ICD-10-CM | POA: Insufficient documentation

## 2024-01-19 DIAGNOSIS — R251 Tremor, unspecified: Secondary | ICD-10-CM | POA: Diagnosis present

## 2024-01-19 DIAGNOSIS — Z931 Gastrostomy status: Secondary | ICD-10-CM | POA: Insufficient documentation

## 2024-01-19 DIAGNOSIS — F329 Major depressive disorder, single episode, unspecified: Secondary | ICD-10-CM

## 2024-01-19 MED ORDER — QUETIAPINE FUMARATE 50 MG PO TABS: 50.0000 mg | ORAL_TABLET | Freq: Every day | ORAL | 5 refills | Status: AC

## 2024-01-19 NOTE — Patient Instructions (Signed)
ALWAYS FEEL FREE TO CALL OUR OFFICE WITH ANY PROBLEMS OR QUESTIONS [phone])  **PLEASE NOTE** ALL MEDICATION REFILL REQUESTS (INCLUDING CONTROLLED SUBSTANCES) NEED TO BE MADE AT LEAST 7 DAYS PRIOR TO REFILL BEING DUE. ANY REFILL REQUESTS INSIDE THAT TIME FRAME MAY RESULT IN DELAYS IN RECEIVING YOUR PRESCRIPTION.
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
## 2024-01-19 NOTE — Telephone Encounter (Signed)
I spoke with Darral Dash with [employer] and she stated that the last 2 times they were late due to holidays and weather.  I asked her if he could get extra supplies just in case this happens again, she said you would just need to write a prescription for extra supplies so that he doesn't run out.  I will fax to them if you can write that for patient.  Thanks.

## 2024-01-25 ENCOUNTER — Emergency Department (HOSPITAL_COMMUNITY)
Admission: EM | Admit: 2024-01-25 | Discharge: 2024-01-25 | Disposition: A | Discharge status: Home / Self Care | Payer: Medicaid Other | Attending: Emergency Medicine | Admitting: Emergency Medicine

## 2024-01-25 ENCOUNTER — Other Ambulatory Visit: Payer: Self-pay

## 2024-01-25 ENCOUNTER — Emergency Department (HOSPITAL_COMMUNITY): Payer: Medicaid Other

## 2024-01-25 ENCOUNTER — Encounter (HOSPITAL_COMMUNITY): Payer: Self-pay

## 2024-01-25 DIAGNOSIS — K9423 Gastrostomy malfunction: Secondary | ICD-10-CM | POA: Insufficient documentation

## 2024-01-25 DIAGNOSIS — Z7901 Long term (current) use of anticoagulants: Secondary | ICD-10-CM | POA: Diagnosis not present

## 2024-01-25 DIAGNOSIS — Z91010 Allergy to peanuts: Secondary | ICD-10-CM | POA: Diagnosis not present

## 2024-01-25 DIAGNOSIS — K9420 Gastrostomy complication, unspecified: Secondary | ICD-10-CM

## 2024-01-25 MED ORDER — DIATRIZOATE MEGLUMINE & SODIUM 66-10 % PO SOLN
30.0000 mL | Freq: Once | ORAL | Status: AC
  Administered 2024-01-25: 30 mL
  Filled 2024-01-25: qty 30

## 2024-01-25 NOTE — Discharge Instructions (Signed)
 You were seen in the emergency department today with concerns of your gastrostomy tube.  The tube was damaged and so it was replaced here in the emergency department.  X-ray imaging confirms the tube placement within the stomach.  This is now safe for use.  You may follow-up with your primary care provider or return to the emergency department for any complications.

## 2024-01-25 NOTE — ED Notes (Signed)
Pt taken to triage and changed after having BM. Pts caregiver assited me with same

## 2024-01-25 NOTE — ED Triage Notes (Signed)
Pt states he needs G-tube replacement. Pts cap came off and got stuck in tube last night.  Denies abd pain.

## 2024-01-25 NOTE — ED Provider Notes (Signed)
 [place] EMERGENCY DEPARTMENT AT [facility] Provider Note   CSN: [id number] Arrival date & time: 01/25/24  1012     History Chief Complaint  Patient presents with   G-tube replacement    Carl Carpenter is a 38 y.o. male. Patient presents to the ED with concerns of G-tube issues. Patient's wife states that a rubber stopper on the end of one of the access sites of the g-tube has broken off in the connector making it difficult to pass medications through.  HPI     Home Medications Prior to Admission medications   Medication Sig Start Date End Date Taking? Authorizing Provider  acetaminophen  (TYLENOL ) 160 MG/5ML solution Place 20.3 mLs (650 mg total) into feeding tube every 6 (six) hours as needed for mild pain, headache or fever. 07/27/22   Setzer, Sandra J, PA-C  apixaban  (ELIQUIS ) 5 MG TABS tablet TAKE 1 TABLET PER FEEDING TUBE TWICE DAILY 11/16/23   Swartz, Zachary T, MD  budesonide (PULMICORT) 0.5 MG/2ML nebulizer solution Take by nebulization. 07/14/23   [provider]  citalopram  (CELEXA ) 40 MG tablet Take 1 tablet (40 mg total) by mouth at bedtime. 10/20/23   Babs Arthea DASEN, MD  FIBER PO PATIENT HT & WT: Ht as of 09/16/22: 1.981 m (6' 6); weight as of 03/03/23: 111 kg (244 lb). Current reported wt is 276# (125.5kg) PROVIDER SIGNING ORDERS: Wanda Kraft, NP ENTERAL NUTRITION SUPPORT IS SOLE SOURCE OF NUTRITION.  TUBE TYPE: 22Fr G tube (brand unknown) DME: [employer]; DME FAX: [phone]  FORMULA: Isosource 1.5  -- *EQUIVILENT FORMULATIONS MAY BE SUBSTITUTED WHERE CLINICALLY APPROPRIATE ROUTE:  G tube ADMINISTRATION:  (2 cartons) TID via syringe bolus; (1 carton once daily via syringe bolus TOTAL VOLUME per day:  1750 mL (7 cartons) KCAL per day:   2625 kcal PROTEIN per day:  119 g FIBER per day:  26.6 g FREE WATER  per day:  1337 mL FLUSHING:  120 mL pre/post each bolus (960mL total) ADDL HYDRATION NEEDED:  Suggest 180mL BID.  (To meet original home regimen, consider 240mL QID.)  SUPPLIES REQUESTED:  30 bolus kits per month 03/22/23   [provider]  fluticasone (FLONASE) 50 MCG/ACT nasal spray Place into the nose. 09/16/22   [provider]  formoterol (PERFOROMIST) 20 MCG/2ML nebulizer solution SMARTSIG:Via Inhaler 07/14/23   [provider]  guaiFENesin -dextromethorphan  (ROBITUSSIN DM) 100-10 MG/5ML syrup Place 15 mLs into feeding tube 3 (three) times daily. 07/27/22   Setzer, Sandra J, PA-C  ipratropium-albuterol  (DUONEB) 0.5-2.5 (3) MG/3ML SOLN Take by nebulization. 07/07/23   [provider]  ketoconazole (NIZORAL) 2 % cream  01/01/23   [provider]  ketoconazole (NIZORAL) 2 % shampoo Apply topically. 01/01/23   [provider]  levofloxacin (LEVAQUIN) 750 MG tablet Take 750 mg by mouth daily. 07/01/23   [provider]  loratadine (CLARITIN) 10 MG tablet Take by mouth. 09/16/22   [provider]  mupirocin  ointment (BACTROBAN ) 2 % Apply 1 Application topically 3 (three) times daily. 03/04/23   [provider]  Nutritional Supplements (FEEDING SUPPLEMENT, JEVITY 1.5 CAL/FIBER,) LIQD Place 474 mLs into feeding tube 4 (four) times daily. 08/12/22   Swartz, Zachary T, MD  Nystatin (GERHARDT'S BUTT CREAM) CREA Apply 1 Application topically as needed for irritation. 07/27/22   Setzer, Nena PARAS, PA-C  propranolol  (INDERAL ) 20 MG/5ML solution GIVE 12.5ML (50MG ) INTO FEEDING TUBE 4 TIMES A DAY 08/04/22   [provider]  propranolol  (INDERAL ) 40 MG  tablet TAKE 1 TABLET BY MOUTH 4 TIMES DAILY OR AS DIRECTED 11/16/23   Babs Arthea DASEN, MD  QUEtiapine  (SEROQUEL ) 50 MG tablet Take 1 tablet (50 mg total) by mouth at bedtime. 01/19/24   Babs Arthea DASEN, MD  saccharomyces boulardii (FLORASTOR) 250 MG capsule Place 1 capsule (250 mg total) into feeding tube 2 (two) times daily. 08/25/22   Babs Arthea DASEN, MD  Water  For Irrigation, Sterile (FREE WATER )  SOLN Place 200 mLs into feeding tube every 8 (eight) hours. 07/27/22   Setzer, Sandra J, PA-C      Allergies    Peanut-containing drug products, Penicillin g, and Penicillins    Review of Systems   Review of Systems  Gastrointestinal:        G-tube issue  All other systems reviewed and are negative.   Physical Exam Updated Vital Signs BP 114/80 (BP Location: Left Arm)   Pulse 84   Temp 98.5 F (36.9 C) (Oral)   Resp 18   Ht 6' 6 (1.981 m)   Wt 133.4 kg   SpO2 95%   BMI 33.98 kg/m  Physical Exam Vitals and nursing note reviewed.  Constitutional:      General: He is not in acute distress.    Appearance: He is well-developed.  HENT:     Head: Normocephalic and atraumatic.  Eyes:     Conjunctiva/sclera: Conjunctivae normal.  Cardiovascular:     Rate and Rhythm: Normal rate and regular rhythm.     Heart sounds: No murmur heard. Pulmonary:     Effort: Pulmonary effort is normal. No respiratory distress.     Breath sounds: Normal breath sounds.  Abdominal:     Palpations: Abdomen is soft.     Tenderness: There is no abdominal tenderness.       Comments: G-tube site unremarkable. Access site appears to have rubber cap piece broken off inside of the tubing.  Musculoskeletal:        General: No swelling.     Cervical back: Neck supple.  Skin:    General: Skin is warm and dry.     Capillary Refill: Capillary refill takes less than 2 seconds.  Neurological:     Mental Status: He is alert.  Psychiatric:        Mood and Affect: Mood normal.     ED Results / Procedures / Treatments   Labs (all labs ordered are listed, but only abnormal results are displayed) Labs Reviewed - No data to display  EKG None  Radiology DG ABDOMEN PEG TUBE LOCATION Result Date: 01/25/2024 CLINICAL DATA:  [id number] PEG (percutaneous endoscopic gastrostomy) adjustment/replacement/removal (HCC) [id number] Gastrostomy tube dysfunction (HCC) [id number] EXAM: ABDOMEN - 1 VIEW COMPARISON:   03/03/2023 FINDINGS: Contrast has been injected through the gastrostomy. This confirms position within the stomach. Nonobstructive bowel gas pattern. Clear lung bases. IMPRESSION: Gastrostomy tube within the stomach. Electronically Signed   By: CHRISTELLA.  Shick M.D.   On: 01/25/2024 17:45    Procedures .Gastrostomy tube replacement  Date/Time: 01/25/2024 6:04 PM  Performed by: Neng Albee A, PA-C Authorized by: Merrilee Ancona A, PA-C  Consent: Verbal consent obtained. Risks and benefits: risks, benefits and alternatives were discussed Consent given by: patient and spouse Patient understanding: patient states understanding of the procedure being performed Patient consent: the patient's understanding of the procedure matches consent given Procedure consent: procedure consent matches procedure scheduled Relevant documents: relevant documents present and verified Test results: test results available and properly labeled Site marked: the operative  site was marked Imaging studies: imaging studies available Patient identity confirmed: verbally with patient and arm band Preparation: Patient was prepped and draped in the usual sterile fashion. Local anesthesia used: no  Anesthesia: Local anesthesia used: no  Sedation: Patient sedated: no  Patient tolerance: patient tolerated the procedure well with no immediate complications Comments: Replaced with 22 French       Medications Ordered in ED Medications  diatrizoate  meglumine -sodium (GASTROGRAFIN ) 66-10 % solution 30 mL (30 mLs Per Tube Given 01/25/24 1739)    ED Course/ Medical Decision Making/ A&P                                 Medical Decision Making Amount and/or Complexity of Data Reviewed Radiology: ordered.  Risk Prescription drug management.   This patient presents to the ED for concern of g-tube issues. Differential diagnosis includes removed g-tube, clogged g-tube, cellulitis, abscess   Imaging Studies ordered:  I  ordered imaging studies including x-ray of the abdomen with PEG to location I independently visualized and interpreted imaging which showed gastrostomy tube within the stomach I agree with the radiologist interpretation   Problem List / ED Course:  Patient presents to the emergency department for concerns of G-tube issues.  Patient's wife states that the rubber stopper and piece on the G-tube broke off within the access site.  Was able to still administer some medications but is unable to keep the area from leaking.  The area around the actual gastrostomy tube insertion site and has no evidence of erythema or swelling.  No recent abdominal pain, nausea, vomiting, or diarrhea. On exam, G-tube appears to still be intact although I can tell it that rubber stopping piece is broken and lodged within the access site of the G-tube.  The tube itself is not extruded.  Will plan on replacement as this tube cannot be fixed. Replace G-tube with 22 French.  Patient tolerated. Tube flowing well. X-ray ordered for evaluation of the G gastrostomy location. X-ray shows gastrostomy tube in within the stomach.  No other acute concerns at this time.  Patient discharged home in stable condition.   Social Determinants of Health:  History of traumatic brain injury  Final Clinical Impression(s) / ED Diagnoses Final diagnoses:  Complication of gastrostomy tube [facility])    Rx / DC Orders ED Discharge Orders     None         Cecily Legrand DELENA DEVONNA 01/25/24 1805    Dreama Longs, MD 01/26/24 2159

## 2024-03-17 ENCOUNTER — Other Ambulatory Visit: Payer: Self-pay | Admitting: Physical Medicine & Rehabilitation

## 2024-05-17 ENCOUNTER — Encounter: Payer: Medicaid Other | Attending: Physical Medicine & Rehabilitation | Admitting: Physical Medicine & Rehabilitation

## 2024-05-17 ENCOUNTER — Encounter: Payer: Self-pay | Admitting: Physical Medicine & Rehabilitation

## 2024-05-17 VITALS — BP 116/78 | HR 83

## 2024-05-17 DIAGNOSIS — G249 Dystonia, unspecified: Secondary | ICD-10-CM | POA: Diagnosis not present

## 2024-05-17 DIAGNOSIS — R482 Apraxia: Secondary | ICD-10-CM | POA: Insufficient documentation

## 2024-05-17 DIAGNOSIS — G969 Disorder of central nervous system, unspecified: Secondary | ICD-10-CM | POA: Insufficient documentation

## 2024-05-17 DIAGNOSIS — R1314 Dysphagia, pharyngoesophageal phase: Secondary | ICD-10-CM | POA: Diagnosis not present

## 2024-05-17 DIAGNOSIS — S069X9D Unspecified intracranial injury with loss of consciousness of unspecified duration, subsequent encounter: Secondary | ICD-10-CM | POA: Diagnosis not present

## 2024-05-17 DIAGNOSIS — F329 Major depressive disorder, single episode, unspecified: Secondary | ICD-10-CM | POA: Diagnosis not present

## 2024-05-17 DIAGNOSIS — Z7901 Long term (current) use of anticoagulants: Secondary | ICD-10-CM | POA: Insufficient documentation

## 2024-05-17 DIAGNOSIS — X58XXXS Exposure to other specified factors, sequela: Secondary | ICD-10-CM | POA: Diagnosis not present

## 2024-05-17 DIAGNOSIS — G479 Sleep disorder, unspecified: Secondary | ICD-10-CM | POA: Diagnosis present

## 2024-05-17 DIAGNOSIS — R251 Tremor, unspecified: Secondary | ICD-10-CM | POA: Diagnosis present

## 2024-05-17 DIAGNOSIS — S069X9S Unspecified intracranial injury with loss of consciousness of unspecified duration, sequela: Secondary | ICD-10-CM | POA: Insufficient documentation

## 2024-05-17 MED ORDER — RAMELTEON 8 MG PO TABS: 8.0000 mg | ORAL_TABLET | Freq: Every day | ORAL | 3 refills | Status: DC
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
## 2024-05-17 NOTE — Patient Instructions (Addendum)
 ALWAYS FEEL FREE TO CALL OUR OFFICE WITH ANY PROBLEMS OR QUESTIONS [phone])  **PLEASE NOTE** ALL MEDICATION REFILL REQUESTS (INCLUDING CONTROLLED SUBSTANCES) NEED TO BE MADE AT LEAST 7 DAYS PRIOR TO REFILL BEING DUE. ANY REFILL REQUESTS INSIDE THAT TIME FRAME MAY RESULT IN DELAYS IN RECEIVING YOUR PRESCRIPTION.    [employer] SPEECH: Located in: [place] [facility] [employer] Address: [address], [place], [place] [address] Phone: [phone]

## 2024-06-16 ENCOUNTER — Encounter: Payer: Self-pay | Admitting: Physical Medicine & Rehabilitation

## 2024-07-17 ENCOUNTER — Other Ambulatory Visit: Payer: Self-pay | Admitting: Physical Medicine & Rehabilitation

## 2024-07-18 NOTE — Telephone Encounter (Signed)
 Refill requested by pharmacy for Eliquis . Dr Babs is out of office for 2 weeks. His notes say Eliquis  until ambulatory and pt was walking with a walker in office. I called and spoke with his wife/so and she reports that he is not in PT now and not walking much at all. I sent in one month supply only until Dr Babs returns to office.

## 2024-08-02 MED ORDER — APIXABAN 5 MG PO TABS: 5.0000 mg | ORAL_TABLET | Freq: Two times a day (BID) | ORAL | 6 refills | Status: AC

## 2024-08-02 NOTE — Telephone Encounter (Signed)
 Refilled eliquis  further. He may need it long term. thx

## 2024-08-02 NOTE — Addendum Note (Signed)
 Addended by: BABS HUSSAR T on: 08/02/2024 08:55 AM   Modules accepted: Orders

## 2024-08-31 ENCOUNTER — Encounter: Payer: Self-pay | Admitting: Physical Medicine & Rehabilitation

## 2024-08-31 DIAGNOSIS — R1314 Dysphagia, pharyngoesophageal phase: Secondary | ICD-10-CM

## 2024-08-31 DIAGNOSIS — S069X9S Unspecified intracranial injury with loss of consciousness of unspecified duration, sequela: Secondary | ICD-10-CM

## 2024-09-01 NOTE — Telephone Encounter (Signed)
 I ordered G-tube replacement by interventional radiology. Could someone check and make sure the appointment get's set up?  Thanks!

## 2024-09-06 ENCOUNTER — Other Ambulatory Visit (HOSPITAL_COMMUNITY): Payer: Self-pay | Admitting: Physical Medicine & Rehabilitation

## 2024-09-06 ENCOUNTER — Encounter (HOSPITAL_COMMUNITY): Payer: Self-pay | Admitting: Physical Medicine & Rehabilitation

## 2024-09-06 DIAGNOSIS — R1314 Dysphagia, pharyngoesophageal phase: Secondary | ICD-10-CM

## 2024-09-06 DIAGNOSIS — R4702 Dysphasia: Secondary | ICD-10-CM

## 2024-09-06 DIAGNOSIS — S069X9S Unspecified intracranial injury with loss of consciousness of unspecified duration, sequela: Secondary | ICD-10-CM

## 2024-09-13 ENCOUNTER — Encounter: Payer: Self-pay | Admitting: Physical Medicine & Rehabilitation

## 2024-09-13 ENCOUNTER — Encounter: Attending: Physical Medicine & Rehabilitation | Admitting: Physical Medicine & Rehabilitation

## 2024-09-13 VITALS — BP 109/75 | HR 84 | Ht 78.0 in | Wt 211.0 lb

## 2024-09-13 DIAGNOSIS — S069X9S Unspecified intracranial injury with loss of consciousness of unspecified duration, sequela: Secondary | ICD-10-CM | POA: Diagnosis not present

## 2024-09-13 DIAGNOSIS — G969 Disorder of central nervous system, unspecified: Secondary | ICD-10-CM | POA: Insufficient documentation

## 2024-09-13 DIAGNOSIS — R251 Tremor, unspecified: Secondary | ICD-10-CM | POA: Diagnosis present

## 2024-09-13 NOTE — Patient Instructions (Signed)
 ALWAYS FEEL FREE TO CALL OUR OFFICE WITH ANY PROBLEMS OR QUESTIONS [phone])  **PLEASE NOTE** ALL MEDICATION REFILL REQUESTS (INCLUDING CONTROLLED SUBSTANCES) NEED TO BE MADE AT LEAST 7 DAYS PRIOR TO REFILL BEING DUE. ANY REFILL REQUESTS INSIDE THAT TIME FRAME MAY RESULT IN DELAYS IN RECEIVING YOUR PRESCRIPTION.                    CHECK WITH [employer] ABOUT VISITS (FREE VS NOMINAL COST)?  MELATONIN 5MG  AT BEDTIME, CAN INCREASE IN 3-4 DAYS TO 10MG  IF NEEDED.
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
## 2024-09-18 ENCOUNTER — Ambulatory Visit (HOSPITAL_COMMUNITY)
Admission: RE | Admit: 2024-09-18 | Discharge: 2024-09-18 | Disposition: A | Discharge status: Home / Self Care | Source: Ambulatory Visit | Attending: Physical Medicine & Rehabilitation | Admitting: Physical Medicine & Rehabilitation

## 2024-09-18 DIAGNOSIS — R4702 Dysphasia: Secondary | ICD-10-CM | POA: Insufficient documentation

## 2024-09-18 DIAGNOSIS — Z431 Encounter for attention to gastrostomy: Secondary | ICD-10-CM | POA: Insufficient documentation

## 2024-09-18 HISTORY — PX: IR REPLC GASTRO/COLONIC TUBE PERCUT W/FLUORO: IMG2333

## 2024-09-18 MED ORDER — IOHEXOL 300 MG/ML  SOLN
50.0000 mL | Freq: Once | INTRAMUSCULAR | Status: AC | PRN
Start: 2024-09-18 — End: 2024-09-18
  Administered 2024-09-18: 10 mL

## 2024-10-16 ENCOUNTER — Other Ambulatory Visit: Payer: Self-pay | Admitting: Physical Medicine & Rehabilitation

## 2024-10-27 ENCOUNTER — Other Ambulatory Visit: Payer: Self-pay | Admitting: Physical Medicine & Rehabilitation

## 2024-11-21 ENCOUNTER — Telehealth: Payer: Self-pay

## 2024-11-22 NOTE — Telephone Encounter (Signed)
Patient is aware of the approval;

## 2024-11-30 ENCOUNTER — Encounter: Payer: Self-pay | Admitting: Physical Medicine & Rehabilitation

## 2024-11-30 DIAGNOSIS — R1312 Dysphagia, oropharyngeal phase: Secondary | ICD-10-CM

## 2024-11-30 DIAGNOSIS — S069X3S Unspecified intracranial injury with loss of consciousness of 1 hour to 5 hours 59 minutes, sequela: Secondary | ICD-10-CM

## 2024-12-01 NOTE — Telephone Encounter (Signed)
 Referrals made for Carl Carpenter need to be faxed. Number is on rx.  Thanks!

## 2024-12-28 ENCOUNTER — Encounter: Payer: Self-pay | Admitting: Physical Medicine & Rehabilitation

## 2025-01-08 NOTE — Progress Notes (Addendum)
 As the clinical instructor I directed the activities, made all applicable skilled judgements, and am responsible for the assessment and treatment of the patient in today's session.  The therapy student did participate in the delivery of the skilled interventions and development of the documentation of the therapy session, under my direct supervision and participation.   MEDICARE/MEDICAID: Student was present and involved in the delivery of care as directed by this therapist.   Andriette Porteous, MA, CCC-SLP, CLT  [facility] ST SPEECH/HEARING THERAPY INDIVIDUAL [id number]  01/08/2025 Andriette SHAUNNA Porteous, SLP GN 1 Filed     [facility] ST ORAL FUNCTION THERAPY [id number]  01/08/2025 Andriette SHAUNNA Porteous, SLP GN 1 Filed    Time IN 1100  Time OUT [address]   [place] [id number]  Total Min 45         Speech-Language Pathology Treatment Note  01/08/2025 Carl Carpenter  Referring Physician: Babs Hussar, DO Visit Diagnosis: 1. Oropharyngeal dysphagia      2. Dysarthria and anarthria        Medical Diagnosis: Problem List[1]  Interpreter used?: No Family/Caregiver present for session: Yes Cultural/Spiritual Beliefs to Incorporate into Treatment Sessions: No   ASSESSMENT Swallow: Patient able to consume pureed textures, hard/dry solids, and mildly thick liquids with 3x overt signs/symptoms of aspiration with head turn. Education and training provided to patient and caregivers on PO trials ongoing at home and to track progress. SLP student clinician demonstrated and educated patient's significant other and son on HEP tongue strength and ROM exercises to be working on. SLP student also notes extra fatigue from patient demonstrated by decreased endurance during session, requiring more frequent rest periods to continue in safe participation.  Speech/Communication: Improvement with mod verbal/visual cues for speech clarity and spontaneous conversation. Focus on providing patient choices compared to  yes/no. Improvement noted with spontaneous speech for conversational discourse. Patient able to form four questions for conversation starters. This includes: Where are you from?, What brought you here?, How are you? And What is your name?  SUBJECTIVE Pt arrived with significant other, [name], and son, [name]. [name] reported caregiver Nat was out today taking her dad to an appointment. Significant other and Speech Therapy student helped with safe guidance to SLP treatment room. Patient denies pain, agreeable to tx.   OBJECTIVE The following tasks were completed to address targeted goals defined below:  (CPT Code 07473: Swallow Therapy) Assessment of mildly thick liquids, pureed textures, soft textures, and hard/dry textures. Patient demonstrating continued impulsivity, but agreeable and understanding of manual stopping via straw pinch.   (CPT Code 07492: Voice/Motor Speech/Language Therapy)  Patient-centered therapy included rote speech tasks, focusing on over articulation and slowed speech. Improved communication when given optional answers and receptive to conversational prompts. Education and training provided to patient and caregivers. Verbal cues provided for conversation prompts.   Goals:   Goals Addressed             This Visit's Progress    ST   On track    Pt will complete daily HEP 6/7 days over 2 week period Pt will complete MBSS Pt will particpate in diet texture trials with solids and liquids to determine suitability for diet advancement Pt's care partners will verbalize swallow strategies, precautions, s/sx aspiration and risks associated with aspiration        Plan Treatment/Interventions: Motor speech; Swallowing; Verbal expression Frequency of Services: 1x/week; 2x/week SLP Duration: Other (comment) Treatment Plan Details: 30 sessions; 92526- Swallow tx, 07492- SPeech tx          [  1] Patient Active Problem List Diagnosis   Recurrent  pneumonia   Dysphagia   Dysphagia, oropharyngeal phase   Traumatic brain injury with loss of consciousness of 1 hour to 5 hours 59 minutes    (CMD)   Unspecified intracranial injury with loss of consciousness status unknown, initial encounter (CMD)   Impaired functional mobility, balance, gait, and endurance   Right leg DVT    (CMD)   Aspiration pneumonitis    (CMD)

## 2025-01-10 ENCOUNTER — Encounter: Payer: Self-pay | Admitting: Physical Medicine & Rehabilitation

## 2025-01-10 ENCOUNTER — Encounter: Attending: Physical Medicine & Rehabilitation | Admitting: Physical Medicine & Rehabilitation

## 2025-01-10 VITALS — BP 104/74 | HR 80 | Temp 97.3°F | Ht 78.0 in | Wt 286.0 lb

## 2025-01-10 DIAGNOSIS — G479 Sleep disorder, unspecified: Secondary | ICD-10-CM | POA: Insufficient documentation

## 2025-01-10 MED ORDER — ESZOPICLONE 1 MG PO TABS: 1.0000 mg | ORAL_TABLET | Freq: Every evening | ORAL | 0 refills | Status: AC | PRN

## 2025-01-10 NOTE — Patient Instructions (Signed)
" °  VISIT SUMMARY: During your visit, we discussed your ongoing challenges following your traumatic brain injury, including neurological and functional deficits, dysphagia, insomnia, and a pressure ulcer. We reviewed your current treatments and made adjustments to help improve your condition.  YOUR PLAN: -SEVERE TRAUMATIC BRAIN INJURY WITH SEQUELAE: You have ongoing cognitive, behavioral, and physical challenges due to your brain injury. We advised against using marijuana as it can impair your recovery. You should continue with your physical and speech therapy to aid in your rehabilitation.  -SEVERE DYSPHAGIA: You have difficulty swallowing, which requires the use of thickening agents to prevent choking. We ordered Simply Thick thickener and advised your family to check insurance coverage. Continue with speech therapy to improve your swallowing and reduce dependence on tube feeding.  -INSOMNIA AND SLEEP DISTURBANCE: You have trouble sleeping, which affects your daily activities. We added Lunesta  to your current medications to help improve your sleep. Continue taking Seroquel  and melatonin, and we will reassess after trying Lunesta .  -PRESSURE ULCER DUE TO EXTERNAL CATHETER: You have a pressure ulcer caused by the use of a condom catheter at night. We ordered the [facility] external catheter system to help reduce skin breakdown. Your family should verify insurance coverage for this system. Continue with wound care as directed and monitor for healing.  INSTRUCTIONS: Please follow up with your speech therapist to continue working on your swallowing difficulties. Verify insurance coverage for the Simply Thick thickener and the Purewick external catheter system. Continue taking your medications as prescribed and monitor for any side effects. We will reassess your sleep medications after you have tried Lunesta . Ensure you continue with your physical and speech therapy sessions to aid in your  recovery.    Contains text generated by [employer].   "

## 2025-01-10 NOTE — Progress Notes (Signed)
 "  Subjective:    Patient ID: Carl Carpenter, male    DOB: January 13, 1986, 39 y.o.   MRN: [id number]  HPI  Discussed the use of AI scribe software for clinical note transcription with the patient, who gave verbal consent to proceed.  History of Present Illness Carl Carpenter is a 39 year old male with traumatic brain injury who presents for follow-up of persistent neurological and functional deficits.  Neurological and Behavioral Deficits: - Persistent neurological and functional impairments following traumatic brain injury - Requires frequent redirection and explanation when persistent about desires - Generally calm but can be agitated - Accompanied by a caregiver for support  Dysphagia and Feeding Difficulties: - Significant dysphagia with ongoing feeding challenges - Currently undergoing speech therapy (4-5 out of 30 planned sessions completed) - Goal to discontinue tube feeding by end of therapy - Partial oral intake; demonstrates good chewing during therapy but difficulty at home - Swallows food after minimal chewing and requires prompting to clear mouth before additional bites - Tolerates soft foods (pudding, oatmeal); increased difficulty with textured foods - Graham crackers dipped in pudding trialed to improve texture tolerance - Thickener (Simply Thick) used in feeding regimen; caregiver seeking clarification on insurance coverage  Insomnia: - Significant insomnia persists - Current medications include Seroquel  and melatonin without adequate improvement - Sleep described as poor - Concern regarding impact of sedating medications on daytime physical therapy participation - Uses marijuana to aid sleep; recent use confirmed  Pressure Ulcer Due to External Catheter: - 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 attributed to condom catheter use at night for urinary incontinence - Lesion began as small spot, now approximately size of a thumb - Area has not healed despite  various wound care measures - Requires condom catheter at night due to high nocturnal urine output - Alternative collection devices not feasible due to tendency to roll during sleep - Caregiver consulted nurse supervisor regarding possible use of Purewick system at home  Urinary Incontinence and Supplies: - Ongoing need for incontinence briefs - Caregiver experiencing administrative issues with supply orders  Physical Therapy: - Physical therapy is ongoing and described as going well    Pain Inventory Average Pain 0 Pain Right Now 0 My pain is No pain  In the last 24 hours, has pain interfered with the following? General activity N/A Relation with others N/A Enjoyment of life N.A What TIME of day is your pain at its worst? varies Sleep (in general) Poor  Pain is worse with: N/A Pain improves with: Therapy helps with walking Relief from Meds: 0  No family history on file. Social History   Socioeconomic History   Marital status: Significant Other    Spouse name: Not on file   Number of children: Not on file   Years of education: Not on file   Highest education level: Not on file  Occupational History   Not on file  Tobacco Use   Smoking status: Former    Current packs/day: 1.50    Types: Cigarettes   Smokeless tobacco: Never  Vaping Use   Vaping status: Never Used  Substance and Sexual Activity   Alcohol use: Not Currently   Drug use: Not Currently   Sexual activity: Not on file  Other Topics Concern   Not on file  Social History Narrative   Not on file   Social Drivers of Health   Tobacco Use: Medium Risk (01/10/2025)   Patient History    Smoking Tobacco Use: Former  Smokeless Tobacco Use: Never    Passive Exposure: Not on file  Financial Resource Strain: Not on file  Food Insecurity: Low Risk (02/22/2024)   Received from [facility]   Epic    Within the past 12 months, you worried that your food would run out before you got money to buy more: Never  true    Within the past 12 months, the food you bought just didn't last and you didn't have money to get more. : Never true  Transportation Needs: No Transportation Needs (02/22/2024)   Received from [employer]    In the past 12 months, has lack of reliable transportation kept you from medical appointments, meetings, work or from getting things needed for daily living? : No  Physical Activity: Not on file  Stress: Not on file  Social Connections: Not on file  Depression (PHQ2-9): Medium Risk (05/17/2024)   Depression (PHQ2-9)    PHQ-2 Score: 8  Alcohol Screen: Not on file  Housing: Low Risk (02/22/2024)   Received from [facility]   Epic    What is your living situation today?: I have a steady place to live    Think about the place you live. Do you have problems with any of the following? Choose all that apply:: None/None on this list  Utilities: Low Risk (02/22/2024)   Received from [facility]   Utilities    In the past 12 months has the electric, gas, oil, or water  company threatened to shut off services in your home? : No  Health Literacy: Not on file   Past Surgical History:  Procedure Laterality Date   ESOPHAGOGASTRODUODENOSCOPY ENDOSCOPY N/A 03/18/2022   Procedure: ESOPHAGOGASTRODUODENOSCOPY ENDOSCOPY;  Surgeon: Paola Dreama SAILOR, MD;  Location: [facility] OR;  Service: General;  Laterality: N/A;   IR REPLACE G-TUBE SIMPLE WO FLUORO  05/05/2023   IR REPLC GASTRO/COLONIC TUBE PERCUT W/FLUORO  03/24/2022   IR REPLC GASTRO/COLONIC TUBE PERCUT W/FLUORO  05/20/2022   IR REPLC GASTRO/COLONIC TUBE PERCUT W/FLUORO  09/18/2024   PEG PLACEMENT N/A 03/18/2022   Procedure: LAPAROSCOPIC ASSITED PERCUTANEOUS GASTROSTOMY (PEG) PLACEMENT;  Surgeon: Paola Dreama SAILOR, MD;  Location: [facility];  Service: General;  Laterality: N/A;   TRACHEOSTOMY TUBE PLACEMENT N/A 03/18/2022   Procedure: TRACHEOSTOMY;  Surgeon: Paola Dreama SAILOR, MD;  Location: [facility];  Service: General;  Laterality: N/A;    Past Surgical History:  Procedure Laterality Date   ESOPHAGOGASTRODUODENOSCOPY ENDOSCOPY N/A 03/18/2022   Procedure: ESOPHAGOGASTRODUODENOSCOPY ENDOSCOPY;  Surgeon: Paola Dreama SAILOR, MD;  [facility];  Service: General;  Laterality: N/A;   IR REPLACE G-TUBE SIMPLE WO FLUORO  05/05/2023   IR REPLC GASTRO/COLONIC TUBE PERCUT W/FLUORO  03/24/2022   IR REPLC GASTRO/COLONIC TUBE PERCUT W/FLUORO  05/20/2022   IR REPLC GASTRO/COLONIC TUBE PERCUT W/FLUORO  09/18/2024   PEG PLACEMENT N/A 03/18/2022   Procedure: LAPAROSCOPIC ASSITED PERCUTANEOUS GASTROSTOMY (PEG) PLACEMENT;  Surgeon: Paola Dreama SAILOR, MD;  [facility]: [facility];  Service: General;  Laterality: N/A;   TRACHEOSTOMY TUBE PLACEMENT N/A 03/18/2022   Procedure: TRACHEOSTOMY;  Surgeon: Paola Dreama SAILOR, MD;  Location: [facility];  Service: General;  Laterality: N/A;   Past Medical History:  Diagnosis Date   C. difficile colitis    DVT (deep venous thrombosis) (HCC)    Hypertension    Iron deficiency    TBI (traumatic brain injury) (HCC)    BP 104/74 (BP Location: Left Arm, Patient Position: Sitting, Cuff Size: Large)   Pulse 80  Temp (!) 97.3 F (36.3 C) (Oral)   Ht 6' 6 (1.981 m)   Wt 286 lb (129.7 kg)   SpO2 92%   BMI 33.05 kg/m   Opioid Risk Score:   Fall Risk Score:  `1  Depression screen [facility] 2/9     05/17/2024   11:06 AM 10/20/2023   10:15 AM 07/21/2023   10:40 AM 05/19/2023   11:14 AM 02/10/2023   11:48 AM 02/10/2023   11:44 AM 11/18/2022    2:29 PM  Depression screen PHQ 2/9  Decreased Interest 1 0 0 0 0 0 0  Down, Depressed, Hopeless 1 0 0 0 0 0 0  PHQ - 2 Score 2 0 0 0 0 0 0  Altered sleeping 3        Tired, decreased energy 0        Feeling bad or failure about yourself  0        Trouble concentrating 0        Moving slowly or fidgety/restless 3        Suicidal thoughts 0        PHQ-9 Score 8         Difficult doing work/chores Very difficult           Data saved with a previous flowsheet row definition       Review of Systems     Objective:   Physical Exam General: No acute distress HEENT: NCAT, EOMI, oral membranes moist Cards: reg rate  Chest: normal effort Abdomen: Soft, NT, ND Skin: dry, intact Extremities: no edema Psych: pleasant and appropriate  Skin: intact Neuro: oriented to month, year. Follows commands.  Speech remains dysarthric with gradual improvement. RUE 5/5. RLE 5/5 with impaired but improving iniitiation and coordination.  Still with  ataxic type movements but these are improving. Favors left side.  LUE and LLE nearly 5/5. Decreased FMC as well,  DTR's 3+. No resting tone. Sensation intact. Gait more fluids with platform walker. He accentuated heel strike, sometimes hitting front of walker with leg. Does lean to left slightly. Take a few second to initiate back extension during sit to stand,  Musculoskeletal: Full ROM, No pain with AROM or PROM in the neck, trunk, or extremities.       Assessment & Plan:  Severe TBI -HEP, revisit therapy Platform rollator works Severe dysphagia--pureed and pudding liquids currently - meds and TF thru  PEG still  -making progress with purees and textures.--still struggles with liquids  -chin tuck -SLP trying to advance textures RLE DVT: eliquis  until he's ambulatory Behavior/mood/sleep/depression: Propranolol --continue Celexa -  40mg  qhs Seroquel  100mg  --continue for now Melatonin 5-10mg  at night Later sleep time Lunesta  trial. Continue meds above for the time being Belsomra--non-preferred with MCD. Ideally I would like to try this 5.  Loose stool resolved with new formula isosource 6.  Seizure-like activity: continue keppra  for now.              -now off keppra  7. Cognition/initiation             -off ritalin  completely 8. Dyskinesia/apraxia             -ongoing progress. Improving with repetition little by little              -still needs work. 9. OPHTHALMOLOGY  FOLLOW UP FOR EYES this year? 10. Urinary incontinence:  purewick catheter rx was written.     20  minutes of face to face patient care time  were spent during this visit. All questions were encouraged and answered.           "

## 2025-05-09 ENCOUNTER — Encounter: Admitting: Physical Medicine & Rehabilitation
